# Patient Record
Sex: Female | Born: 1945 | Race: White | Hispanic: No | Smoking: Former smoker
Health system: Southern US, Community
[De-identification: ages and names within clinical notes are randomized; demographics above are authoritative.]

## PROBLEM LIST (undated history)

## (undated) DIAGNOSIS — C50919 Malignant neoplasm of unspecified site of unspecified female breast: Secondary | ICD-10-CM

## (undated) DIAGNOSIS — R0600 Dyspnea, unspecified: Secondary | ICD-10-CM

## (undated) DIAGNOSIS — H269 Unspecified cataract: Secondary | ICD-10-CM

## (undated) DIAGNOSIS — C801 Malignant (primary) neoplasm, unspecified: Secondary | ICD-10-CM

## (undated) DIAGNOSIS — I10 Essential (primary) hypertension: Secondary | ICD-10-CM

## (undated) DIAGNOSIS — J189 Pneumonia, unspecified organism: Secondary | ICD-10-CM

## (undated) DIAGNOSIS — Z923 Personal history of irradiation: Secondary | ICD-10-CM

## (undated) DIAGNOSIS — N183 Chronic kidney disease, stage 3 unspecified: Secondary | ICD-10-CM

## (undated) DIAGNOSIS — M779 Enthesopathy, unspecified: Secondary | ICD-10-CM

## (undated) DIAGNOSIS — Z853 Personal history of malignant neoplasm of breast: Secondary | ICD-10-CM

## (undated) DIAGNOSIS — M1990 Unspecified osteoarthritis, unspecified site: Secondary | ICD-10-CM

## (undated) HISTORY — DX: Unspecified osteoarthritis, unspecified site: M19.90

## (undated) HISTORY — PX: COLONOSCOPY: SHX174

## (undated) HISTORY — DX: Malignant neoplasm of unspecified site of unspecified female breast: C50.919

## (undated) HISTORY — PX: REPLACEMENT TOTAL KNEE: SUR1224

## (undated) HISTORY — PX: BREAST RECONSTRUCTION: SHX9

## (undated) HISTORY — DX: Malignant (primary) neoplasm, unspecified: C80.1

## (undated) HISTORY — DX: Pneumonia, unspecified organism: J18.9

## (undated) HISTORY — PX: ABDOMINAL HYSTERECTOMY: SHX81

## (undated) HISTORY — DX: Enthesopathy, unspecified: M77.9

## (undated) HISTORY — DX: Personal history of malignant neoplasm of breast: Z85.3

## (undated) HISTORY — PX: TISSUE EXPANDER PLACEMENT: SHX2530

---

## 1983-11-15 HISTORY — PX: PARTIAL HYSTERECTOMY: SHX80

## 2000-11-14 HISTORY — PX: CHOLECYSTECTOMY: SHX55

## 2001-08-03 ENCOUNTER — Encounter: Payer: Self-pay | Admitting: Family Medicine

## 2001-08-03 ENCOUNTER — Encounter: Admission: RE | Admit: 2001-08-03 | Discharge: 2001-08-03 | Payer: Self-pay | Admitting: Family Medicine

## 2001-08-08 ENCOUNTER — Encounter: Payer: Self-pay | Admitting: Family Medicine

## 2001-08-08 ENCOUNTER — Encounter: Admission: RE | Admit: 2001-08-08 | Discharge: 2001-08-08 | Payer: Self-pay | Admitting: Family Medicine

## 2001-08-08 DIAGNOSIS — Z853 Personal history of malignant neoplasm of breast: Secondary | ICD-10-CM | POA: Insufficient documentation

## 2001-08-13 ENCOUNTER — Encounter: Payer: Self-pay | Admitting: General Surgery

## 2001-08-13 ENCOUNTER — Encounter: Admission: RE | Admit: 2001-08-13 | Discharge: 2001-08-13 | Payer: Self-pay | Admitting: General Surgery

## 2001-08-15 ENCOUNTER — Encounter: Payer: Self-pay | Admitting: General Surgery

## 2001-08-15 ENCOUNTER — Encounter: Admission: RE | Admit: 2001-08-15 | Discharge: 2001-08-15 | Payer: Self-pay | Admitting: General Surgery

## 2001-08-15 ENCOUNTER — Ambulatory Visit (HOSPITAL_BASED_OUTPATIENT_CLINIC_OR_DEPARTMENT_OTHER): Admission: RE | Admit: 2001-08-15 | Discharge: 2001-08-15 | Payer: Self-pay | Admitting: General Surgery

## 2001-08-15 ENCOUNTER — Encounter (INDEPENDENT_AMBULATORY_CARE_PROVIDER_SITE_OTHER): Payer: Self-pay | Admitting: *Deleted

## 2001-08-28 ENCOUNTER — Ambulatory Visit (HOSPITAL_BASED_OUTPATIENT_CLINIC_OR_DEPARTMENT_OTHER): Admission: RE | Admit: 2001-08-28 | Discharge: 2001-08-28 | Payer: Self-pay | Admitting: General Surgery

## 2001-08-28 ENCOUNTER — Encounter (INDEPENDENT_AMBULATORY_CARE_PROVIDER_SITE_OTHER): Payer: Self-pay | Admitting: *Deleted

## 2001-08-28 HISTORY — PX: BREAST LUMPECTOMY: SHX2

## 2001-09-06 ENCOUNTER — Ambulatory Visit: Admission: RE | Admit: 2001-09-06 | Discharge: 2001-12-05 | Payer: Self-pay | Admitting: Radiation Oncology

## 2001-10-01 ENCOUNTER — Encounter: Admission: RE | Admit: 2001-10-01 | Discharge: 2001-10-01 | Payer: Self-pay | Admitting: Radiation Oncology

## 2002-02-24 ENCOUNTER — Encounter: Payer: Self-pay | Admitting: *Deleted

## 2002-02-25 ENCOUNTER — Inpatient Hospital Stay (HOSPITAL_COMMUNITY): Admission: EM | Admit: 2002-02-25 | Discharge: 2002-02-28 | Payer: Self-pay | Admitting: *Deleted

## 2002-02-25 ENCOUNTER — Encounter: Payer: Self-pay | Admitting: General Surgery

## 2002-03-06 ENCOUNTER — Encounter: Payer: Self-pay | Admitting: General Surgery

## 2002-03-06 ENCOUNTER — Ambulatory Visit (HOSPITAL_COMMUNITY): Admission: RE | Admit: 2002-03-06 | Discharge: 2002-03-06 | Payer: Self-pay | Admitting: General Surgery

## 2002-04-12 ENCOUNTER — Encounter: Payer: Self-pay | Admitting: Family Medicine

## 2002-04-12 ENCOUNTER — Ambulatory Visit (HOSPITAL_COMMUNITY): Admission: RE | Admit: 2002-04-12 | Discharge: 2002-04-12 | Payer: Self-pay | Admitting: Family Medicine

## 2002-05-01 ENCOUNTER — Encounter: Admission: RE | Admit: 2002-05-01 | Discharge: 2002-05-01 | Payer: Self-pay | Admitting: General Surgery

## 2002-05-01 ENCOUNTER — Encounter: Payer: Self-pay | Admitting: General Surgery

## 2002-10-23 ENCOUNTER — Encounter: Admission: RE | Admit: 2002-10-23 | Discharge: 2002-10-23 | Payer: Self-pay | Admitting: General Surgery

## 2002-10-23 ENCOUNTER — Encounter: Payer: Self-pay | Admitting: General Surgery

## 2003-01-06 ENCOUNTER — Ambulatory Visit (HOSPITAL_COMMUNITY): Admission: RE | Admit: 2003-01-06 | Discharge: 2003-01-06 | Payer: Self-pay | Admitting: Family Medicine

## 2003-01-06 ENCOUNTER — Encounter: Payer: Self-pay | Admitting: Family Medicine

## 2003-05-05 ENCOUNTER — Encounter: Payer: Self-pay | Admitting: General Surgery

## 2003-05-05 ENCOUNTER — Encounter: Admission: RE | Admit: 2003-05-05 | Discharge: 2003-05-05 | Payer: Self-pay | Admitting: General Surgery

## 2004-05-11 ENCOUNTER — Encounter: Admission: RE | Admit: 2004-05-11 | Discharge: 2004-05-11 | Payer: Self-pay | Admitting: General Surgery

## 2005-03-09 ENCOUNTER — Ambulatory Visit (HOSPITAL_COMMUNITY): Admission: RE | Admit: 2005-03-09 | Discharge: 2005-03-09 | Payer: Self-pay | Admitting: Family Medicine

## 2005-05-12 ENCOUNTER — Encounter: Admission: RE | Admit: 2005-05-12 | Discharge: 2005-05-12 | Payer: Self-pay | Admitting: General Surgery

## 2005-07-08 ENCOUNTER — Ambulatory Visit: Payer: Self-pay | Admitting: Internal Medicine

## 2005-07-08 ENCOUNTER — Ambulatory Visit (HOSPITAL_COMMUNITY): Admission: RE | Admit: 2005-07-08 | Discharge: 2005-07-08 | Payer: Self-pay | Admitting: Internal Medicine

## 2006-05-15 ENCOUNTER — Encounter (INDEPENDENT_AMBULATORY_CARE_PROVIDER_SITE_OTHER): Payer: Self-pay | Admitting: Diagnostic Radiology

## 2006-05-15 ENCOUNTER — Encounter (INDEPENDENT_AMBULATORY_CARE_PROVIDER_SITE_OTHER): Payer: Self-pay | Admitting: *Deleted

## 2006-05-15 ENCOUNTER — Encounter: Admission: RE | Admit: 2006-05-15 | Discharge: 2006-05-15 | Payer: Self-pay | Admitting: Family Medicine

## 2006-05-29 ENCOUNTER — Encounter: Admission: RE | Admit: 2006-05-29 | Discharge: 2006-05-29 | Payer: Self-pay | Admitting: General Surgery

## 2006-06-07 ENCOUNTER — Encounter: Admission: RE | Admit: 2006-06-07 | Discharge: 2006-06-07 | Payer: Self-pay | Admitting: Family Medicine

## 2006-06-07 ENCOUNTER — Encounter (INDEPENDENT_AMBULATORY_CARE_PROVIDER_SITE_OTHER): Payer: Self-pay | Admitting: *Deleted

## 2006-06-07 ENCOUNTER — Encounter (INDEPENDENT_AMBULATORY_CARE_PROVIDER_SITE_OTHER): Payer: Self-pay | Admitting: Diagnostic Radiology

## 2006-06-29 ENCOUNTER — Encounter: Admission: RE | Admit: 2006-06-29 | Discharge: 2006-06-29 | Payer: Self-pay | Admitting: General Surgery

## 2006-06-29 ENCOUNTER — Ambulatory Visit (HOSPITAL_COMMUNITY): Admission: RE | Admit: 2006-06-29 | Discharge: 2006-06-29 | Payer: Self-pay | Admitting: General Surgery

## 2006-08-15 ENCOUNTER — Ambulatory Visit (HOSPITAL_BASED_OUTPATIENT_CLINIC_OR_DEPARTMENT_OTHER): Admission: RE | Admit: 2006-08-15 | Discharge: 2006-08-16 | Payer: Self-pay | Admitting: General Surgery

## 2006-08-15 ENCOUNTER — Ambulatory Visit (HOSPITAL_COMMUNITY): Admission: RE | Admit: 2006-08-15 | Discharge: 2006-08-15 | Payer: Self-pay | Admitting: General Surgery

## 2006-08-15 ENCOUNTER — Encounter (INDEPENDENT_AMBULATORY_CARE_PROVIDER_SITE_OTHER): Payer: Self-pay | Admitting: *Deleted

## 2006-08-15 HISTORY — PX: MASTECTOMY: SHX3

## 2006-09-25 ENCOUNTER — Encounter: Admission: RE | Admit: 2006-09-25 | Discharge: 2006-09-25 | Payer: Self-pay | Admitting: Oncology

## 2006-09-25 ENCOUNTER — Ambulatory Visit (HOSPITAL_COMMUNITY): Payer: Self-pay | Admitting: Oncology

## 2006-10-11 ENCOUNTER — Ambulatory Visit (HOSPITAL_COMMUNITY): Admission: RE | Admit: 2006-10-11 | Discharge: 2006-10-11 | Payer: Self-pay | Admitting: Oncology

## 2006-11-27 ENCOUNTER — Ambulatory Visit (HOSPITAL_COMMUNITY): Admission: RE | Admit: 2006-11-27 | Discharge: 2006-11-27 | Payer: Self-pay | Admitting: Family Medicine

## 2006-12-01 ENCOUNTER — Ambulatory Visit (HOSPITAL_COMMUNITY): Payer: Self-pay | Admitting: Oncology

## 2006-12-22 ENCOUNTER — Ambulatory Visit (HOSPITAL_COMMUNITY): Payer: Self-pay | Admitting: Oncology

## 2007-03-27 ENCOUNTER — Ambulatory Visit (HOSPITAL_COMMUNITY): Payer: Self-pay | Admitting: Oncology

## 2007-03-27 ENCOUNTER — Encounter (HOSPITAL_COMMUNITY): Admission: RE | Admit: 2007-03-27 | Discharge: 2007-04-26 | Payer: Self-pay | Admitting: Oncology

## 2007-05-22 ENCOUNTER — Encounter: Admission: RE | Admit: 2007-05-22 | Discharge: 2007-05-22 | Payer: Self-pay | Admitting: General Surgery

## 2007-09-26 ENCOUNTER — Ambulatory Visit (HOSPITAL_COMMUNITY): Payer: Self-pay | Admitting: Oncology

## 2008-03-26 ENCOUNTER — Ambulatory Visit (HOSPITAL_COMMUNITY): Payer: Self-pay | Admitting: Oncology

## 2008-05-22 ENCOUNTER — Encounter: Admission: RE | Admit: 2008-05-22 | Discharge: 2008-05-22 | Payer: Self-pay | Admitting: Surgery

## 2008-06-17 ENCOUNTER — Encounter: Admission: RE | Admit: 2008-06-17 | Discharge: 2008-06-17 | Payer: Self-pay | Admitting: Surgery

## 2008-08-18 ENCOUNTER — Inpatient Hospital Stay (HOSPITAL_COMMUNITY): Admission: RE | Admit: 2008-08-18 | Discharge: 2008-08-21 | Payer: Self-pay | Admitting: Orthopedic Surgery

## 2008-11-03 ENCOUNTER — Ambulatory Visit (HOSPITAL_COMMUNITY): Payer: Self-pay | Admitting: Oncology

## 2009-04-23 ENCOUNTER — Encounter (HOSPITAL_COMMUNITY): Admission: RE | Admit: 2009-04-23 | Discharge: 2009-05-23 | Payer: Self-pay | Admitting: Oncology

## 2009-04-23 ENCOUNTER — Ambulatory Visit (HOSPITAL_COMMUNITY): Payer: Self-pay | Admitting: Oncology

## 2009-04-28 ENCOUNTER — Ambulatory Visit (HOSPITAL_COMMUNITY): Admission: RE | Admit: 2009-04-28 | Discharge: 2009-04-28 | Payer: Self-pay | Admitting: Oncology

## 2009-06-17 ENCOUNTER — Encounter: Admission: RE | Admit: 2009-06-17 | Discharge: 2009-06-17 | Payer: Self-pay | Admitting: Family Medicine

## 2009-07-27 ENCOUNTER — Inpatient Hospital Stay (HOSPITAL_COMMUNITY): Admission: RE | Admit: 2009-07-27 | Discharge: 2009-07-29 | Payer: Self-pay | Admitting: Orthopedic Surgery

## 2009-10-26 ENCOUNTER — Ambulatory Visit (HOSPITAL_COMMUNITY): Payer: Self-pay | Admitting: Oncology

## 2010-05-25 ENCOUNTER — Encounter (HOSPITAL_COMMUNITY): Admission: RE | Admit: 2010-05-25 | Discharge: 2010-06-24 | Payer: Self-pay | Admitting: Oncology

## 2010-05-25 ENCOUNTER — Ambulatory Visit (HOSPITAL_COMMUNITY): Payer: Self-pay | Admitting: Oncology

## 2010-06-18 ENCOUNTER — Encounter: Admission: RE | Admit: 2010-06-18 | Discharge: 2010-06-18 | Payer: Self-pay | Admitting: Family Medicine

## 2010-06-25 ENCOUNTER — Encounter: Admission: RE | Admit: 2010-06-25 | Discharge: 2010-06-25 | Payer: Self-pay | Admitting: Family Medicine

## 2010-11-29 ENCOUNTER — Encounter (HOSPITAL_COMMUNITY)
Admission: RE | Admit: 2010-11-29 | Discharge: 2010-12-14 | Payer: Self-pay | Source: Home / Self Care | Attending: Oncology | Admitting: Oncology

## 2010-11-29 ENCOUNTER — Ambulatory Visit (HOSPITAL_COMMUNITY)
Admission: RE | Admit: 2010-11-29 | Discharge: 2010-12-14 | Payer: Self-pay | Source: Home / Self Care | Attending: Oncology | Admitting: Oncology

## 2010-12-03 ENCOUNTER — Other Ambulatory Visit (HOSPITAL_COMMUNITY): Payer: Self-pay | Admitting: Oncology

## 2010-12-03 DIAGNOSIS — C50919 Malignant neoplasm of unspecified site of unspecified female breast: Secondary | ICD-10-CM

## 2010-12-04 ENCOUNTER — Other Ambulatory Visit (HOSPITAL_COMMUNITY): Payer: Self-pay | Admitting: Oncology

## 2010-12-04 DIAGNOSIS — C50919 Malignant neoplasm of unspecified site of unspecified female breast: Secondary | ICD-10-CM

## 2010-12-05 ENCOUNTER — Encounter: Payer: Self-pay | Admitting: General Surgery

## 2011-01-30 LAB — COMPREHENSIVE METABOLIC PANEL
ALT: 18 U/L (ref 0–35)
AST: 20 U/L (ref 0–37)
Alkaline Phosphatase: 83 U/L (ref 39–117)
BUN: 12 mg/dL (ref 6–23)
Calcium: 10.1 mg/dL (ref 8.4–10.5)
Creatinine, Ser: 0.98 mg/dL (ref 0.4–1.2)
GFR calc Af Amer: >60 mL/min (ref >60)
Glucose, Bld: 86 mg/dL (ref 70–99)
Potassium: 4.7 mEq/L (ref 3.5–5.1)
Sodium: 141 mEq/L (ref 135–145)
Total Bilirubin: 0.6 mg/dL (ref 0.3–1.2)
Total Protein: 7.5 g/dL (ref 6.0–8.3)

## 2011-01-30 LAB — LIPID PANEL
HDL: 63 mg/dL (ref >39)
LDL Cholesterol: 153 mg/dL — ABNORMAL HIGH (ref 0–99)
Total CHOL/HDL Ratio: 3.7 RATIO
VLDL: 20 mg/dL (ref 0–40)

## 2011-01-30 LAB — CBC
Hemoglobin: 14.3 g/dL (ref 12.0–15.0)
MCH: 29 pg (ref 26.0–34.0)
RDW: 14.7 % (ref 11.5–15.5)

## 2011-01-30 LAB — DIFFERENTIAL
Eosinophils Relative: 2 % (ref 0–5)
Neutro Abs: 5.7 10*3/uL (ref 1.7–7.7)
Neutrophils Relative %: 73 % (ref 43–77)

## 2011-02-18 LAB — DIFFERENTIAL
Basophils Relative: 1 % (ref 0–1)
Eosinophils Absolute: 0.2 10*3/uL (ref 0.0–0.7)
Eosinophils Relative: 2 % (ref 0–5)
Lymphocytes Relative: 20 % (ref 12–46)
Monocytes Relative: 7 % (ref 3–12)
Neutro Abs: 5.7 10*3/uL (ref 1.7–7.7)
Neutrophils Relative %: 71 % (ref 43–77)

## 2011-02-18 LAB — CBC
Hemoglobin: 9.3 g/dL — ABNORMAL LOW (ref 12.0–15.0)
MCHC: 33.5 g/dL (ref 30.0–36.0)
MCHC: 34.2 g/dL (ref 30.0–36.0)
MCHC: 34.7 g/dL (ref 30.0–36.0)
MCV: 87.6 fL (ref 78.0–100.0)
Platelets: 305 10*3/uL (ref 150–400)
RBC: 3.09 MIL/uL — ABNORMAL LOW (ref 3.87–5.11)
RBC: 3.41 MIL/uL — ABNORMAL LOW (ref 3.87–5.11)
RDW: 13.5 % (ref 11.5–15.5)
WBC: 13.1 10*3/uL — ABNORMAL HIGH (ref 4.0–10.5)
WBC: 8.1 10*3/uL (ref 4.0–10.5)

## 2011-02-18 LAB — COMPREHENSIVE METABOLIC PANEL
CO2: 28 mEq/L (ref 19–32)
Creatinine, Ser: 1.01 mg/dL (ref 0.4–1.2)
Glucose, Bld: 88 mg/dL (ref 70–99)
Total Protein: 7.1 g/dL (ref 6.0–8.3)

## 2011-02-18 LAB — URINALYSIS, ROUTINE W REFLEX MICROSCOPIC
Bilirubin Urine: NEGATIVE
Hgb urine dipstick: NEGATIVE
Protein, ur: NEGATIVE mg/dL
Urobilinogen, UA: 0.2 mg/dL (ref 0.0–1.0)

## 2011-02-18 LAB — BASIC METABOLIC PANEL
CO2: 28 mEq/L (ref 19–32)
Calcium: 8 mg/dL — ABNORMAL LOW (ref 8.4–10.5)
Calcium: 8.4 mg/dL (ref 8.4–10.5)
Creatinine, Ser: 1.13 mg/dL (ref 0.4–1.2)
GFR calc Af Amer: 59 mL/min — ABNORMAL LOW (ref >60)
GFR calc Af Amer: >60 mL/min (ref >60)
GFR calc non Af Amer: 49 mL/min — ABNORMAL LOW (ref >60)
Sodium: 141 mEq/L (ref 135–145)

## 2011-02-18 LAB — TYPE AND SCREEN
ABO/RH(D): A POS
Antibody Screen: NEGATIVE

## 2011-02-18 LAB — URINE CULTURE

## 2011-02-21 LAB — LIPID PANEL
Cholesterol: 216 mg/dL — ABNORMAL HIGH (ref 0–200)
HDL: 58 mg/dL (ref >39)
LDL Cholesterol: 141 mg/dL — ABNORMAL HIGH (ref 0–99)
Total CHOL/HDL Ratio: 3.7 RATIO

## 2011-02-21 LAB — DIFFERENTIAL
Lymphocytes Relative: 22 % (ref 12–46)
Lymphs Abs: 1.3 10*3/uL (ref 0.7–4.0)
Neutro Abs: 4.1 10*3/uL (ref 1.7–7.7)
Neutrophils Relative %: 68 % (ref 43–77)

## 2011-02-21 LAB — CBC
Hemoglobin: 13.5 g/dL (ref 12.0–15.0)
MCHC: 35 g/dL (ref 30.0–36.0)
MCV: 85.6 fL (ref 78.0–100.0)
RBC: 4.52 MIL/uL (ref 3.87–5.11)

## 2011-02-21 LAB — COMPREHENSIVE METABOLIC PANEL
BUN: 14 mg/dL (ref 6–23)
CO2: 27 mEq/L (ref 19–32)
Calcium: 9.4 mg/dL (ref 8.4–10.5)
Creatinine, Ser: 0.93 mg/dL (ref 0.4–1.2)
GFR calc Af Amer: >60 mL/min (ref >60)
GFR calc non Af Amer: >60 mL/min (ref >60)
Glucose, Bld: 92 mg/dL (ref 70–99)

## 2011-03-29 NOTE — Discharge Summary (Signed)
NAME:  Haley Peterson, Haley Peterson NO.:  [id number]   MEDICAL RECORD NO.:  [id number]          PATIENT TYPE:  INP   LOCATION:  5039                         FACILITY:  [facility]   PHYSICIAN:  Mila Homer. Sherlean Foot, M.D. DATE OF BIRTH:  1946/11/13   DATE OF ADMISSION:  08/18/2008  DATE OF DISCHARGE:  08/21/2008                               DISCHARGE SUMMARY   ADMISSION DIAGNOSIS:  The patient has osteoarthritis of the left knee.   DISCHARGE DIAGNOSES:  1. Osteoarthritis of the left knee.  2. Status post left total knee arthroplasty.  3. Acute blood loss anemia status post surgery.   PROCEDURE:  Left total knee arthroscopy.   HISTORY:  The patient has a chief complaint of left knee pain.  The  patient is a 65 year old female who complains of pain in both knees  times several years, left greater than right.  She states the pain is  gradually getting worse, and it is now interfering with activities of  daily living.  The patient has failed conservative treatment.  Risks and  benefits of the surgery were discussed with the patient.  She would like  to proceed with a left TKA.   ALLERGIES:  The patient has allergies to CODEINE and to PENICILLIN.   ADMISSION MEDICATIONS:  1. Femara 2.5 mg daily.  2. Multivitamin daily.  3. Caltrate and plus D 600/400 twice a day.  4. Vitamin C 500 mg daily.   HOSPITAL COURSE:  This is a 65 year old female, admitted on August 18, 2008.  After appropriate laboratory studies were obtained preoperatively  as well as vancomycin on-call to the operating room, she was taken to  the OR where she underwent a left TKA.  She tolerated the procedure well  and was taken to the PACU in good condition.  The patient was placed on  Dilaudid PCA pump, reduced dose protocol.  Foley was placed  intraoperatively.   On postop day 1, vital signs were stable.  The patient denies chest  pain, shortness of breath, or calf pain.  The patient was started on  Lovenox 30 mg  subcu q.12 h. at 8:00 a.m.  Consults to PT/OT and care  management were made.  The patient is weightbearing as tolerated.  CPM  was 0-9 degrees for 6-8 hours per day with incentive spirometry  teaching.   On postop day 2, the patient did not progress well with physical therapy  upon day 2.  Dressing was changed.  Marcaine pump and Hemovac were  discontinued.  Foley was discontinued.  PCA was discontinued and IV was  Hep-Locked.  The patient was continued on p.o. meds for pain.   On postop day 3, the patient was again in physical therapy where goals  were met at that time, and she was okay to go home.  The patient was  discharged after Lovenox teaching.   LABORATORY STUDIES:  Preop labs were white blood cell count 6.3, H&H  were 14 and 41.7, and platelets were 340.  Sodium was 138, K was 4,  chloride was 102, CO2 was  38, glucose was 93, BUN was 12, and creatinine  0.971.  On date of discharge, her white blood cell count was 13.4, H&H  were 9.2 and 27.2.  She was asymptomatic.  Platelets were 223.  Sodium  was 138, potassium was 3.3, chloride was 101, CO2 was 28, glucose was  107, BUN was 7, and creatinine was 0.77.   DISCHARGE INSTRUCTIONS:  There are no restrictions to diet.  Follow the  blue instruction sheet for wound care.  Increase activity slowly.  May  use a cane or walker.  Weightbearing as tolerated.  No lifting or  driving times 6 weeks and home health care.   DISCHARGE MEDICATIONS:  Prescriptions were given for:  1. Robaxin 500 mg take 1-2 tabs every 6-8 hours as needed for spasm      #15.  2. Potassium chloride 10 mEq take 1 tablet twice a day #14 for      hypokalemia.  3. Vicodin 5/500 take 1-2 tablets every 4-6 hours as needed for pain      #16.  4. Lovenox 40 mg inject 1 subcu daily, last dose on September 01, 2008,      #11.   The patient will follow up with Dr. Sherlean Foot on September 02, 2008.  Call for  appointment at [phone].  The patient is discharged in improved   condition.     ______________________________  Altamese Cabal, PA-C    ______________________________  Mila Homer. Sherlean Foot, M.D.    MJ/MEDQ  D:  09/26/2008  T:  09/26/2008  Job:  [id number]

## 2011-03-29 NOTE — Op Note (Signed)
NAME:  Haley Peterson, GOOGE NO.:  [id number]   MEDICAL RECORD NO.:  [id number]          PATIENT TYPE:  INP   LOCATION:  5039                         FACILITY:  [facility]   PHYSICIAN:  Mila Homer. Sherlean Foot, M.D. DATE OF BIRTH:  1946/08/23   DATE OF PROCEDURE:  08/18/2008  DATE OF DISCHARGE:                               OPERATIVE REPORT   SURGEON:  Mila Homer. Sherlean Foot, MD   ASSISTANTS:  Altamese Cabal, PA-C and Skip Mayer, PA-C   PREOPERATIVE DIAGNOSIS:  Left knee osteoarthritis.   POSTOPERATIVE DIAGNOSIS:  Left knee osteoarthritis.   PROCEDURE:  Left total knee arthroplasty.   INDICATIONS FOR PROCEDURE:  The patient is a 65 year old white female  with failure to conservative measures for osteoarthritis of the left  knee.  Informed consent was obtained.   DESCRIPTION OF PROCEDURE:  The patient was laid in supine and  administered general anesthesia.  In the supine position, the Foley  catheter was placed.  The left leg was prepped and draped in the usual  sterile fashion.  Inferolateral and inferomedial portals were created  with a #11 blade, blunt trocar, and cannula.  A midline incision was  made with a #10 blade, 7 inches in length.  I used a fresh blade to make  a median parapatellar arthrotomy and performed synovectomy.  I then  elevated the deep MCL off the medial crest of the tibia around the  semimembranosus and did release the semimembranosus tendon.  I then  everted the patella and measured 24-mm thick.  I reamed down 9 mm with a  32-mm reamer and drilled 3 lug holes with a prosthetic trial in place,  and recreated 24-mm thickness.  I then removed trial component, went to  flexion subluxing the patella laterally.  I used extramedullary  alignment system on the tibia and the intramedullary alignment system on  the femur to make appropriate mesenteric cut to the anatomic axis of the  tibia.  A 6-degree valgus cut in the anatomic axis of femur.  I then  marked out  the epicondylar angle, posterior condylar angle, and the  femur measured 3 degrees.  I sized to size D, pinned to 3 degrees  external rotation holes.  I screwed the formal cutting block in place,  made the anterior, posterior, and chamfer cuts with a sagittal saw.  I  then used the size 4 tibial tray drilling keel on the femur and then  trialed with a 4 tibia, D femur, 10 insert, 32 patella had good  flexion/extension gap balance, did have to perform medial releasing to  obtain that.  Patellar tracking was excellent.  I removed the trial  components and copiously irrigated.  I then cemented in the D femur, 4  tibial component, removed excess cement, snapped 10-mm polyethylene, and  let the cement harden in extension.  I cemented in the patella and  removed excess cement.  After Hemovac coming out deep to the arthrotomy  and superolaterally, pain catheter coming out superomedially and  superficially deep to the arthrotomy.  Closed the arthrotomy after  obtaining  hemostasis with tourniquet down.  Closed the arthrotomy with  figure-of-eight #1 Vicryl sutures.  Then, the deep soft tissues were  buried with 0 Vicryl suture, subcuticular 3-0 Vicryl stitch, and skin  staples.  Dressed with a Xeroform, dry sponges, and stocking.   COMPLICATIONS:  None.   DRAINS:  One Hemovac, one pain catheter.   ESTIMATED BLOOD LOSS:  300 mL.   TOURNIQUET TIME:  47 minutes.           ______________________________  Mila Homer Sherlean Foot, M.D.     SDL/MEDQ  D:  08/18/2008  T:  08/19/2008  Job:  [id number]

## 2011-04-01 NOTE — H&P (Signed)
[facility]  Patient:    Haley Peterson, Haley Peterson Visit Number: [id number] MRN: [id number]          Service Type: MED Location: 3A A339 01 Attending Physician:  Rosalyn Charters Dictated by:   Barbaraann Barthel, M.D. Proc. Date: 02/25/02 Admit Date:  02/24/2002   CC:         Patrica Duel, M.D.  Orlean Patten, M.D.  Lionel December, M.D.   History and Physical  NOTE:  Surgery was asked to see this 65 year old white female who was admitted to the emergency room with right upper quadrant pain and nausea.  CHIEF COMPLAINT:  Right upper quadrant pain radiating across the upper portion of her abdomen that is described as belt-like and radiating to her back.  This has been accompanied with nausea and is approximately 48 hours in duration.  HISTORY OF PRESENT ILLNESS:  The patient states that she was in her usual state of good health when Friday, in the afternoon, she developed some upper abdominal discomfort.  This was described as radiating across her back.  She attributed this first to some dyspepsia.  However, this did not improve when she took some antiacids.  This later developed more severely on Sunday requiring admission approximately around midnight on Sunday evening.  She was seen by the emergency room and admitted, and surgery was consulted.  PHYSICAL EXAMINATION:  GENERAL:  Discloses a pleasant 65 year old white female in no acute distress. She is 5 feet 5 inches tall and weighs 172 pounds.  VITAL SIGNS:  Afebrile with temperature 97.5, heart rate is regular at 67 per minute, respirations 16 per minute and blood pressure is 133/67.  HEENT:  Head is normocephalic.  Eyes; extraocular movements are intact, pupils are round and react to light and accommodation, there is no conjunctive pallor or scleral injection, scleral is a normal tincture.  Ears; no discharge, complaint of pain or loss of hearing.  Mouth and oral mucosa; within normal limits.  Nose;  within normal limits.  NECK:  Supple and cylindrical without jugular vein distention, thyromegaly, tracheal deviation, or cervical adenopathy.  No bruits are auscultated.  LUNGS:  Clear both anterior and posterior auscultation.  HEART:  Regular rhythm, no murmurs or gallops are appreciated.  ABDOMEN:  Bowel sounds are present.  The patient is tender on deep palpation just to the right of the midline in the upper right quadrant.  There are no femoral or inguinal hernias appreciated.  RECTAL:  Guaiac negative stool.  No uterus is palpated.  EXTREMITIES:  Within normal limits.  No clubbing, varicosities, or cyanosis are appreciated.  REVIEW OF SYSTEMS:  GI:  History of 48 hours duration of right upper quadrant pain radiating to her back and accompanied with nausea.  The patient has no past history of hepatitis or inflammatory bowel disease.  The patient describes no change in her bowel habits, no bright red rectal bleeding or black tarry stools, and no unexplained weight loss.  No past history of hepatitis.   GU:  No history of nephrolithiasis or dysuria.  ENDOCRINE:  No history of diabetes or thyroid disease.  CARDIOVASCULAR:  No history of chest pain or shortness of breath, or any history of myocardial infarction. MUSCULOSKELETAL:  The patient has had problems in the past with her left knee requiring laparoscopic arthroscopy and she fractured her left knee in the past.  CURRENT MEDICATIONS:  Patient takes no medicines on a regular basis.  ALLERGIES:  PENICILLIN and TERRAMYCIN.  SOCIAL HISTORY:  She  is a nonsmoker and nondrinker.  LABORATORY DATA:  The patient was admitted with elevated liver enzymes with SGPT of 247, SGPT of 279, alkaline phosphatase of 158, and bilirubin of 1.4. Her electrolytes were within normal limits.  Her white count was 8.5 with an H&H of 12.9 and 36.0 with 83 neutrophils noted.  The patients followup liver function studies show a downward trend where  the alkaline phosphatase is decreased from 158 to 135, SGPT decreased from 247 to 212, and SGPT slightly increased from 279 to 295, and amylase is also decreased to 165.  Acute abdominal series shows normal chest x-ray, normal gas pattern without free air or obstructive pattern.  Sonogram revealed the common bile duct is 7 mm and with multiple stones within and a slightly thickened gallbladder.  IMPRESSION:  Gallstone pancreatitis with a downward trend of her liver function studies and amylase.  I suspect she has passed a small stone.  She has a small common bile duct with no dilated intrahepatic radicles.  I have asked the GI service to consult to evaluate the need for an ERCP.  This may not be necessary as these studies are showing a decrease and a downward trend, and the patient has developed no febrile episodes or any acute abdominal changes.  PLAN:  For laparoscopic cholecystectomy when the pancreatitis has subsided somewhat, and we will obtain input by the GI service.  In the interim, we will keep her on clear liquids and I have not begun any antibiotics at this point.  I will follow her during this hospitalization and she will have an interval cholecystectomy during this hospitalization if all are in agreement. Dictated by:   Barbaraann Barthel, M.D. Attending Physician:  Rosalyn Charters DD:  02/25/02 TD:  02/25/02 Job: [id number]

## 2011-04-01 NOTE — Op Note (Signed)
[facility]  Patient:    Haley Peterson, Haley Peterson Visit Number: [id number] MRN: [id number]          Service Type: DSU Location: [facility] Attending Physician:  Janalyn Rouse Dictated by:   Rose Phi. Maple Hudson, M.D. Proc. 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  OPERATION:  Wide excision of previous lumpectomy site.  SURGEON:  Rose Phi. Maple Hudson, M.D.  ANESTHESIA:  General.  DESCRIPTION OF PROCEDURE: After suitable general anesthesia was induced, the patient was placed in the supine position with the left arm extended on the arm board.  The left breast was then prepped and draped in the usual fashion. An elliptical incision incorporated the previous scar that was radial in design centered in the 3 oclock position of her left breast.  A wide excision of the tissue including the previous biopsy cavity was then carried out. After removing this, the specimen was oriented for pathologist and then submitted to them to evaluate margins on permanents.  Hemostasis was obtained with the cautery.  This excision went down to the chest wall incorporating the pectoralis fascia and certainly I cannot go any deeper.  The skin was closed with interrupted 4-0 nylon sutures.  Dressing applied.  The patient was transferred to the recovery room in satisfactory condition having tolerated the procedure well. Dictated by:   Rose Phi. Maple Hudson, M.D. Attending Physician:  Janalyn Rouse DD:  08/15/01 TD:  08/28/01 Job: [id number]

## 2011-04-01 NOTE — Op Note (Signed)
NAME:  Haley Peterson, Haley Peterson                   ACCOUNT NO.:  [id number]   MEDICAL RECORD NO.:  [id number]          PATIENT TYPE:  AMB   LOCATION:  DSC                          FACILITY:  [facility]   PHYSICIAN:  Rose Phi. Maple Hudson, M.D.   DATE OF BIRTH:  1945-11-29   DATE OF PROCEDURE:  08/15/2006  DATE OF DISCHARGE:                                 OPERATIVE REPORT   PREOPERATIVE DIAGNOSIS:  Multicentric carcinoma of the right breast.   POSTOPERATIVE DIAGNOSIS:  Multicentric carcinoma of the right breast.   OPERATIONS:  1. Blue dye injection.  2. Right sentinel lymph node biopsy.  3. Right total mastectomy.   SURGEON:  Rose Phi. Maple Hudson, M.D.   ANESTHESIA:  General.   OPERATIVE PROCEDURE:  Prior to coming to the operating room, 1 mCi of  technetium Sulfur Colloid was injected intradermally in the periareolar  tissue of the right breast.   After suitable general anesthesia was induced, the patient was placed in a  supine position with the arms extended on the arm board.  Then, 5 cc of a  mixture of 2 cc of methylene blue and 3 cc of injectable saline was injected  in the subareolar tissue of the right breast and the breast then gently  massaged for 3 minutes.  We then prepped and draped out the breast and  axilla, including the left breast.   A short transverse axillary incision was made on the right side, with  dissection through the subcutaneous tissue to the clavipectoral fascia.  Deep to the fascia were 2 blue and hot lymph nodes, which were excised as  sentinel nodes.  There were no other blue, hot or palpable nodes.   While the nodes were being evaluated by the pathologist, transverse  elliptical incisions incorporating the nipple-areolar complex were outlined.  The incisions were made and the flaps dissected in the standard fashion  using the cautery, going superiorly to near the clavicle, medially to the  medial border of the sternum, and inferiorly to the level of the  inframammary fold  at the rectus fascia, and laterally to the latissimus  dorsi muscle.   We then removed the breast by dissecting from medial to lateral,  incorporating the pectoralis fascia.  At the end of this dissection, the  pathologist reported that both sentinel nodes were negative for metastatic  disease.  Having then terminated the mastectomy at the level of the axilla,  we obtained good hemostasis with the cautery.  We thoroughly irrigated the  field with saline.   Dr. Etter Sjogren was then to place a tissue expander for reconstruction  purposes, and that will be dictated in a separate note.      Rose Phi. Maple Hudson, M.D.  Electronically Signed     PRY/MEDQ  D:  08/15/2006  T:  08/15/2006  Job:  [id number]   cc:   Etter Sjogren, M.D.

## 2011-04-01 NOTE — Op Note (Signed)
NAME:  Haley Peterson, NONG NO.:  [id number]   MEDICAL RECORD NO.:  [id number]          PATIENT TYPE:  AMB   LOCATION:  DSC                          FACILITY:  [facility]   PHYSICIAN:  Etter Sjogren, M.D.     DATE OF BIRTH:  1946-04-20   DATE OF PROCEDURE:  08/15/2006  DATE OF DISCHARGE:                                 OPERATIVE REPORT   PREOPERATIVE DIAGNOSIS:  Right breast cancer.   POSTOPERATIVE DIAGNOSIS:  Right breast cancer.   PROCEDURE PERFORMED:  Right breast reconstruction with tissue expander.   SURGEON:  Etter Sjogren, M.D.   ANESTHESIA:  General.   ESTIMATED BLOOD LOSS:  Minimal.   DRAINS:  One Harrison Mons was left.   CLINICAL NOTE:  A 66 year old woman, who has right breast cancer.  She has  previously had left breast cancer treated with lumpectomy and radiation  therapy, right breast cancer, and she desired reconstruction.  Mastectomy  was planned.  She selected use of a tissue expander, followed eventually by  a staged procedure using an implant, and she preferred this over using her  own tissue.  After the nature of this  procedure and the risks and possible  complications were discussed with her in detail on two separate occasions,  she understood the risks and possible complications, overall convalescence,  and wished to proceed.   DESCRIPTION OF PROCEDURE:  The patient was in the operating room and the  mastectomy had been completed.  The dissection was carried deep to the  pectoralis major muscle.  A submuscular pocket was developed down to the  inframammary crease.  Great care taken to avoid damage to underlying chest  cavity.  Meticulous hemostasis with electrocautery.  Thorough irrigation  with saline, as well as with antibiotic solution.  The implant was prepared  after thoroughly cleaning gloves.  This was a McGhan tissue expander, style  , 300 cc implant.  The implant was prepared, soaked in antibiotic  solution.  Air was removed, 50 cc of  saline placed using the closed filling  system.  The implant was positioned in a submuscular pocket after checking  for hemostasis, which was noted to be excellent.  The muscle was closed with  3-0 Vicryl interrupted, horizontal mattress sutures, taking care to avoid  damage to the underlying implant/expander.  A Blake drain was positioned and  brought out through a separate stab wound inferiorly and secured with a 3-0  Prolene suture.  Excellent hemostasis was again confirmed, and the remainder  of the closure with 3-0 Monocryl interrupted, inverted deep sutures, and  running 4-0 Monocryl  subcuticular sutures.  Steri-Strips, soft sterile dressing, circumferential  Ace wrap applied.  Transferred to the recovery room stable and awake, having  tolerated the procedure well.   DISPOSITION:  She will be observed overnight here.      Etter Sjogren, M.D.  Electronically Signed     DB/MEDQ  D:  08/15/2006  T:  08/15/2006  Job:  [id number]   cc:   Dr Etter Sjogren

## 2011-04-01 NOTE — Discharge Summary (Signed)
[facility]  Patient:    SRAH, AKE Visit Number: [id number] MRN: [id number]          Service Type: OUT Location: RAD Attending Physician:  Barbaraann Barthel Dictated by:   Barbaraann Barthel, M.D. Admit Date:  03/06/2002 Discharge Date: 03/06/2002                             Discharge Summary  DIAGNOSES: 1. Biliary pancreatitis. 2. Cholecystitis, cholelithiasis.  CONSULTATIONS: 1. Lionel December, M.D., GI service. 2. Patrica Duel, M.D., Medical service.  PROCEDURE:  February 27, 2002 laparoscopic cholecystectomy.  SUMMARY:  This is a 65 year old white female who was admitted through the emergency room with gallstone pancreatitis. We saw her in consultation with the GI service in essence over the two days preceding her surgery. Clinically, her abdominal pain resolved and her liver function studies and her amylase returned to within normal limits and she was taken to surgery. This was performed on February 28, 2002. A laparoscopic cholecystectomy was performed uneventfully. She was noted to have a very short cystic duct and a nondilated common bile duct, and I did not perform a cholangiogram; however, she clearly had clinically passed her gallstones and preoperatively her pancreatitis had subsided. Pathology revealed chronic calculus cholecystitis.  HOSPITAL COURSE:  The patient did quite well after surgery. Her diet and activity was advanced as tolerated. Her liver function studies showed a downward trend after her surgery. Her AST was 63 on February 26, 2002 with an ALT of 5 and a total bilirubin of 0.6. Her amylase on February 26, 2002 had diminished from 292 on admission to 43. At the time prior to her discharge, she was tolerating p.o. well, she was afebrile, her wounds were clean, and she was voiding without dysuria, or having any shortness of breath or leg pain.  LABORATORY AND ACCESSORY DATA:  As mentioned above regarding her liver function studies  and amylase. On February 26, 2002, her white count was 4.4 with an H&H of 11.8 and 33.4.  Chest x-ray showed no acute changes. Her acute abdominal series was grossly within normal limits. She had an ultrasound which showed cholelithiasis with acute cholecystitis, and there was question choledocholithiasis. There was also found a small right renal cyst that was nonsignificant. Common bile duct was not dilated and it was 7 mm in diameter. EKG was within normal limits.  DISPOSITION:  The patient did well after surgery with no acute changes, and she was discharged on February 28, 2002.  DISCHARGE INSTRUCTIONS:  WORK:  She is excused from work.  DIET:  She is discharged on a full liquid and a soft diet.  ACTIVITY:  She is told to increase her activity as tolerated. She is permitted a shower or tub bath and shampoo her hair. She is permitted to walk up and down the stairs. She is told to refrain from any vigorous lifting, no driving, and refrain from any sexual activity in the immediate postoperative period.  DISCHARGE MEDICATIONS: 1. Darvocet one tab p.o. q.4 h. p.r.n. pain. 2. Clean her wound with alcohol 3 times a day.  FOLLOWUP:  We will follow her perioperatively after which she is to return to the medical service. Dictated by:   Barbaraann Barthel, M.D. Attending Physician:  Barbaraann Barthel DD:  03/26/02 TD:  03/27/02 Job: [id number]

## 2011-04-01 NOTE — Op Note (Signed)
[place]. [facility]  Patient:    Haley Peterson, Haley Peterson Visit Number: [id number] MRN: [id number]          Service Type: DSU Location: [facility] Attending Physician:  Janalyn Rouse Dictated by:   Rose Phi. Maple Hudson, M.D. Proc. Date: 08/15/01 Admit Date:  08/15/2001                             Operative Report  PREOPERATIVE DIAGNOSIS:  Ductal carcinoma in situ of the left breast, complex.  POSTOPERATIVE DIAGNOSIS:  Ductal carcinoma in situ of the left breast, complex.  PROCEDURES: 1. Blue dye injection. 2. Left axillary sentinel lymph node biopsy. 3. Left partial mastectomy with needle localization and specimen mammography.  SURGEON:  Rose Phi. Maple Hudson, M.D.  ANESTHESIA:  General.  DESCRIPTION OF PROCEDURE:  Prior to coming to the operating room, 1 mCi of Technetium sulfur colloid was injected intradermally and then, after she was put to sleep, 5 cc of lymphazurin blue was injected subareolarly.  After suitable general anesthesia was induced and the blue dye had been injected, the patient was placed in the supine position with the left arm extended on the arm board.  The abnormal area was at the 3 oclock position of the left breast, and a wire was centered there.  A radial incision was made and a wide excision of this area incorporating the wire and surrounding tissue was carried out.  The specimen was oriented for the pathologist.  It was then submitted for specimen mammography, which confirmed the removal of the lesion, and then submitted to the pathologist for touch prep.  The touch prep showed abnormal cells at the 12 oclock position, so I excised some more tissue, and that wound was closed with subcuticular 4-0 Monocryl and Steri-Strips.  While the pathologist was reviewing the partial mastectomy specimen, a short transverse left axillary incision was made with dissection through the subcutaneous tissue to the clavipectoral fascia.  Using the NeoProbe as  a guide and then following a blue lymphatic, a blue and hot lymph node was then removed.  There were no other blue or hot or palpable nodes.  This was submitted to the pathologist for touch preps, which turned out to be negative. The incisions were closed with 3-0 Vicryl, 4-0 Monocryl, and Steri-Strips. Dressings applied.  Patient transferred to the recovery room in satisfactory condition, having tolerated the procedure well. Dictated by:   Rose Phi. Maple Hudson, M.D. Attending Physician:  Janalyn Rouse DD:  08/15/01 TD:  08/16/01 Job: [id number]

## 2011-04-01 NOTE — Op Note (Signed)
NAME:  Haley Peterson, Haley Peterson                   ACCOUNT NO.:  [id number]   MEDICAL RECORD NO.:  [id number]          PATIENT TYPE:  AMB   LOCATION:  DAY                           FACILITY:  [facility]   PHYSICIAN:  Lionel December, M.D.    DATE OF BIRTH:  1946/03/21   DATE OF PROCEDURE:  07/08/2005  DATE OF DISCHARGE:                                 OPERATIVE REPORT   PROCEDURE:  Colonoscopy.   INDICATIONS:  Haley Peterson is a 65 year old Caucasian female who is here for  screening colonoscopy. Family history is negative for colorectal carcinoma.  Procedure risks were reviewed with the patient and informed consent was  obtained.   PREOPERATIVE MEDICATIONS:  Demerol 50 mg IV, Versed 5 mg IV in divided  doses.   FINDINGS:  Procedure performed in endoscopy suite. The patient's vital signs  and O2 saturation were monitored during the procedure and remained stable.  The patient was placed in left lateral position and rectal examination  preformed. No abnormality noted on external or digital exam. Olympus  videoscope was placed in rectum and advanced under vision into sigmoid colon  and beyond. Preparation was excellent. Somewhat redundant colon but scope  was passed into cecum which was identified by ileocecal valve and  appendiceal orifice. Pictures taken for the record. As the scope was  withdrawn, the colonic mucosa was once again carefully examined, and no  mucosa abnormalities were noted. Rectal mucosa was normal. Scope was  retroflexed to examine anorectal junction which was unremarkable. Endoscope  was straightened and withdrawn. The patient tolerated the procedure well.   FINAL DIAGNOSIS:  Normal colonoscopy.   RECOMMENDATIONS:  1.  She will resume her usual medications and vitamins.  2.  Yearly Hemoccults.  3.  She may consider next screening exam in 10 years from now.      Lionel December, M.D.  Electronically Signed     NR/MEDQ  D:  07/08/2005  T:  07/08/2005  Job:  [id number]

## 2011-04-01 NOTE — Op Note (Signed)
[facility]  Patient:    Haley Peterson, Haley Peterson Visit Number: [id number] MRN: [id number]          Service Type: MED Location: 3A A339 01 Attending Physician:  Rosalyn Charters Dictated by:   Barbaraann Barthel, M.D. Proc. Date: 02/27/02 Admit Date:  02/24/2002   CC:         Lionel December, M.D.  Christin Bach, M.D.   Operative Report  PREOPERATIVE DIAGNOSIS:  Gallstone pancreatitis.  POSTOPERATIVE DIAGNOSIS:  Gallstone pancreatitis.  OPERATION:  Laparoscopic cholecystectomy.  SURGEON:  Barbaraann Barthel, M.D.  ASSISTANT:  Christin Bach, M.D.  NOTE:  This is a 65 year old white female who was admitted through the emergency room with gallstone pancreatitis.  We saw her in consultation with the GI service.  In essence over the two days prior to her surgery, clinically her abdominal pain resolved, and her nausea resolved, and her liver function studies and amylase returned to within normal limits.  At the time of surgery, her abdomen was completely soft.  Her bilirubin and all of her liver function studies and amylase were within normal limits.  GROSS OPERATIVE FINDINGS:  There were adhesions about the gallbladder.  The gallbladder was minimally thickened with some edema.  The cystic duct was short and of normal caliber.  Sonogram preoperatively revealed minimally dilated common bile duct with a diameter of 7 mm.  As this was a short cystic duct and small in nature, I did not risk canalization of the cystic duct for cholangiogram.  TECHNIQUE:  The patient was placed in the supine position.  After adequate administration of general anesthesia by endotracheal intubation, her entire abdomen was prepped with Betadine solution and draped in the usual manner. Prior to this, a Foley catheter was aseptically inserted.  A periumbilical incision was carried out over the superior aspect of the umbilicus.  With the patient in Trendelenburg, a sharp towel clip was used to  elevate the fascia around the umbilicus, and a Veress needle was inserted and confirmed in position with a saline drop test.  The abdomen was then insufflated with about 3.3 liters of CO2, and the the 11 mm cannula was placed in the umbilicus using the Visiport technique.  Then under direct vision, three other cannulas were placed, an 11 mm cannula in the epigastrium and two 5 mm cannulas in the right upper quadrant laterally.  The gallbladder was grasped.  Its adhesions were taken down, lightly cauterized.  The fundus of the gallbladder was grasped as well as Hartmanns pouch, and we dissected free the cystic duct.  As this was a short cystic duct and for the above-mentioned reasons, I triply silver clipped it on the side of the common bile duct and singly silver clipped it on the side of the gallbladder and divided it.  The cystic artery was likewise identified, triply silver clipped and divided, and the gallbladder was removed from the liver bed using the hook cautery device uneventfully.  There was minimal amount of oozing.  This was easily controlled with the cautery device, and the gallbladder was then removed from the abdomen using the Endosac device.  After checking for hemostasis, I then irrigated the right upper quadrant, and I elected to leave Surgicel in the liver bed.  The abdomen was then desufflated, and fascia in the area where the 11 mm cannulas were placed in the epigastrium and the umbilicus was then anesthetized with 0.5% Sensorcaine to help with postoperatively pain.  All incisions were then closed in the  skin using the stapling device.  Prior to closure, all sponge, needle, and instrument counts were found to be correct.  Estimated blood loss was minimal.  No drains were placed.  The patient received a liter of crystalloids intraoperatively, and there were no complications. Dictated by:   Barbaraann Barthel, M.D. Attending Physician:  Rosalyn Charters DD:  02/27/02 TD:   02/28/02 Job: [id number]

## 2011-04-01 NOTE — Consult Note (Signed)
[facility]  Patient:    JESSIAH, WOJNAR Visit Number: [id number] MRN: [id number]          Service Type: MED Location: 3A A339 01 Attending Physician:  Rosalyn Charters Dictated by:   Barbaraann Barthel, M.D. Proc. Date: 02/25/02 Admit Date:  02/24/2002                            Consultation Report  5ADDENDUM:  REVIEW OF SYSTEMS:  OB/GYN:  The patient is a gravid 1, para 1, cesarean section 0, abortus 0 female who has had carcinoma of the breast last year for which she had a wide excision and sentinel lymph node biopsy and radiation treatment.  She received no chemotherapy.  She has also undergone in the 1980s and a vaginal hysterectomy.  Other surgeries have included a laparoscopic surgery on her left knee and no other surgeries.  As stated, this patient takes no medicines on a regular basis.  Allergic to penicillin and Terramycin.Dictated by:   Barbaraann Barthel, M.D. Attending Physician:  Rosalyn Charters DD:  02/25/02 TD:  02/25/02 Job: [id number]

## 2011-05-23 ENCOUNTER — Ambulatory Visit (HOSPITAL_COMMUNITY)
Admission: RE | Admit: 2011-05-23 | Discharge: 2011-05-23 | Disposition: A | Discharge status: Home / Self Care | Payer: Medicare Other | Source: Ambulatory Visit | Attending: Oncology | Admitting: Oncology

## 2011-05-23 ENCOUNTER — Other Ambulatory Visit: Payer: Self-pay

## 2011-05-23 DIAGNOSIS — Z780 Asymptomatic menopausal state: Secondary | ICD-10-CM | POA: Insufficient documentation

## 2011-05-23 DIAGNOSIS — M818 Other osteoporosis without current pathological fracture: Secondary | ICD-10-CM | POA: Insufficient documentation

## 2011-05-23 DIAGNOSIS — C50919 Malignant neoplasm of unspecified site of unspecified female breast: Secondary | ICD-10-CM

## 2011-05-31 ENCOUNTER — Telehealth (HOSPITAL_COMMUNITY): Payer: Self-pay

## 2011-05-31 NOTE — Telephone Encounter (Signed)
Per patient, this was discussed with her by Edythe Lynn, RN.

## 2011-06-02 ENCOUNTER — Other Ambulatory Visit: Payer: Self-pay | Admitting: Internal Medicine

## 2011-06-02 DIAGNOSIS — Z9011 Acquired absence of right breast and nipple: Secondary | ICD-10-CM

## 2011-06-02 DIAGNOSIS — Z1231 Encounter for screening mammogram for malignant neoplasm of breast: Secondary | ICD-10-CM

## 2011-06-02 DIAGNOSIS — Z9882 Breast implant status: Secondary | ICD-10-CM

## 2011-06-14 ENCOUNTER — Ambulatory Visit (INDEPENDENT_AMBULATORY_CARE_PROVIDER_SITE_OTHER): Payer: Self-pay | Admitting: Surgery

## 2011-06-23 ENCOUNTER — Other Ambulatory Visit (HOSPITAL_COMMUNITY): Payer: Self-pay

## 2011-06-27 ENCOUNTER — Encounter (HOSPITAL_COMMUNITY): Payer: Self-pay

## 2011-06-27 ENCOUNTER — Encounter (HOSPITAL_COMMUNITY): Payer: Medicare Other | Attending: Oncology

## 2011-06-27 VITALS — BP 146/79 | HR 96 | Temp 98.3°F | Wt 179.6 lb

## 2011-06-27 DIAGNOSIS — Z79899 Other long term (current) drug therapy: Secondary | ICD-10-CM | POA: Insufficient documentation

## 2011-06-27 DIAGNOSIS — C50919 Malignant neoplasm of unspecified site of unspecified female breast: Secondary | ICD-10-CM | POA: Insufficient documentation

## 2011-06-27 DIAGNOSIS — Z170 Estrogen receptor positive status [ER+]: Secondary | ICD-10-CM

## 2011-06-27 LAB — DIFFERENTIAL
Basophils Absolute: 0 10*3/uL (ref 0.0–0.1)
Basophils Relative: 0 % (ref 0–1)
Lymphocytes Relative: 17 % (ref 12–46)
Monocytes Absolute: 0.6 10*3/uL (ref 0.1–1.0)
Neutro Abs: 7 10*3/uL (ref 1.7–7.7)
Neutrophils Relative %: 74 % (ref 43–77)

## 2011-06-27 LAB — CBC
HCT: 41.8 % (ref 36.0–46.0)
MCH: 28.8 pg (ref 26.0–34.0)
MCV: 88.4 fL (ref 78.0–100.0)
Platelets: 336 10*3/uL (ref 150–400)
RDW: 13.5 % (ref 11.5–15.5)

## 2011-06-27 LAB — COMPREHENSIVE METABOLIC PANEL
AST: 13 U/L (ref 0–37)
Albumin: 3.7 g/dL (ref 3.5–5.2)
BUN: 14 mg/dL (ref 6–23)
CO2: 28 mEq/L (ref 19–32)
Calcium: 10.1 mg/dL (ref 8.4–10.5)
Creatinine, Ser: 0.95 mg/dL (ref 0.50–1.10)
GFR calc non Af Amer: 59 mL/min — ABNORMAL LOW (ref >60)

## 2011-06-27 LAB — LIPID PANEL
Cholesterol: 230 mg/dL — ABNORMAL HIGH (ref 0–200)
HDL: 62 mg/dL (ref >39)
LDL Cholesterol: 149 mg/dL — ABNORMAL HIGH (ref 0–99)
Triglycerides: 93 mg/dL (ref <150)

## 2011-06-27 LAB — LACTATE DEHYDROGENASE: LDH: 140 U/L (ref 94–250)

## 2011-06-27 NOTE — Patient Instructions (Signed)
[facility]  Discharge Instructions  RECOMMENDATIONS MADE BY THE CONSULTANT AND ANY TEST RESULTS WILL BE SENT TO YOUR REFERRING DOCTOR.   EXAM FINDINGS BY MD TODAY AND SIGNS AND SYMPTOMS TO REPORT TO CLINIC OR PRIMARY MD: Exam per Dr. Dalene Carrow.   INSTRUCTIONS GIVEN AND DISCUSSED: Other  Bone Density was reviewed and you are to continue same therapy and repeat in 2 years.  SPECIAL INSTRUCTIONS/FOLLOW-UP: Return to Clinic in December.   I acknowledge that I have been informed and understand all the instructions given to me and received a copy. I do not have any more questions at this time, but understand that I may call the Specialty Clinic at [facility] at [phone] during business hours should I have any further questions or need assistance in obtaining follow-up care.    __________________________________________  _____________  __________ Signature of Patient or Authorized Representative            Date                   Time    __________________________________________ Nurse's Signature

## 2011-06-27 NOTE — Progress Notes (Signed)
CC:   Catalina Pizza, M.D. Currie Paris, M.D.  IDENTIFYING STATEMENT:  The patient is a 65 year old woman with breast cancer who presents for followup.  INTERIM HISTORY:  Mrs. Thomure remains on Femara and appears to be doing fairly well.  She denies bone pain.  She received a DEXA scan on 05/23/2011, which remains stable.  She denies nausea, vomiting, and abdominal pain.  She has not noted any new breast masses or chest wall lesions. She is not had any change in her bowel function.  MEDICATIONS:  Documented and recorded in intake sheets.  ALLERGIES:  Penicillin, tamoxifen and Aromasin.  REVIEW OF SYSTEMS:  A 10-point review of systems is negative.  PHYSICAL EXAMINATION:  The patient is a well-appearing, well-nourished woman in no distress.  Vitals:  Pulse 96, blood pressure 146/79, temperature 98.3, respirations 18, weight 179 pounds.  HEENT:  Head is atraumatic, normocephalic.  Sclerae anicteric.  Mouth: Moist without thrush.  Neck:  Supple.  Chest: Clear to percussion and auscultation. Breasts:  Exam notes no palpable masses.  No chest wall lesions noted. Skin:  Noted pigmented raised nodules on her back.  The patient notes that this is not new.  Abdomen:  Soft, nontender.  Bowel sounds present. Extremities:  No edema. Pulses present.  CNS: Non focal.  LAB DATA:  Pending.  IMPRESSION AND PLAN:  Mrs. Kirshner is a 65 year old woman who is status post right mastectomy on 08/15/2006 followed by reconstruction for a 1.3 cm and a 7 mm infiltrating ductal carcinomas of the right breast.  The 1.3 cm tumor was ER positive at 83%, PR positive at 84% and HER-2/neu negative, KI-67 marker was 4% and the 7 mm tumor was ER positive at 48%, PR Positive at 92% and KI-67 marker was 4%. HER-2/neu was negative. The patient was not able to tolerate tamoxifen or Aromasin.  However has been on Femara and continues to tolerate the medication well. She completes 5 years of aromatase inhibitor in  November 2012. She follows up then.  She will have labs performed before she is discharged from clinic. She follows up with Dr. Catalina Pizza regarding the skin lesion on her back.    ______________________________ Laurice Record, M.D. LIO/MEDQ  D:  06/27/2011  T:  06/27/2011  Job:  [id number]

## 2011-06-29 ENCOUNTER — Encounter (INDEPENDENT_AMBULATORY_CARE_PROVIDER_SITE_OTHER): Payer: Self-pay | Admitting: Surgery

## 2011-06-30 ENCOUNTER — Ambulatory Visit
Admission: RE | Admit: 2011-06-30 | Discharge: 2011-06-30 | Disposition: A | Discharge status: Home / Self Care | Payer: Medicare Other | Source: Ambulatory Visit | Attending: Internal Medicine | Admitting: Internal Medicine

## 2011-06-30 DIAGNOSIS — Z9011 Acquired absence of right breast and nipple: Secondary | ICD-10-CM

## 2011-06-30 DIAGNOSIS — Z9882 Breast implant status: Secondary | ICD-10-CM

## 2011-06-30 DIAGNOSIS — Z1231 Encounter for screening mammogram for malignant neoplasm of breast: Secondary | ICD-10-CM

## 2011-07-07 ENCOUNTER — Ambulatory Visit (INDEPENDENT_AMBULATORY_CARE_PROVIDER_SITE_OTHER): Payer: Medicare Other | Admitting: Surgery

## 2011-07-07 ENCOUNTER — Encounter (INDEPENDENT_AMBULATORY_CARE_PROVIDER_SITE_OTHER): Payer: Self-pay | Admitting: Surgery

## 2011-07-07 VITALS — BP 146/86 | HR 68

## 2011-07-07 DIAGNOSIS — Z853 Personal history of malignant neoplasm of breast: Secondary | ICD-10-CM

## 2011-07-07 NOTE — Patient Instructions (Signed)
Ask Dr Mariel Sleet about your anti-estrogen medicine. We can see you back on an as needed basis

## 2011-07-07 NOTE — Progress Notes (Signed)
Chief complaint: Breast cancer followup.  History of present illness: This patient had a right mastectomy with sentinel node by Dr. Maple Hudson in 2007 and a prior left lumpectomy with radiation therapy in 2002. She is in for a routine followup. She had a implant reconstruction on the right.  She's had no problems or concerns on either side. Past history family history and review of systems are unchanged from her last visit. ROS no change Examination Gen.: The patient is alert, oriented, and healthy-appearing. Breasts: The right side is post mastectomy with implant and no evidence of local recurrence. The left breast shows a significant loss of tissue laterally at the lumpectomy site. This is been present and I think unchanged. There is nothing to suggest a recurrence as the tissues were soft and nontender. There are minimal radiation type changes noted. Lymphatics: There is no axillary or supraventricular adenopathy on either side.  Extremities: Excellent range of motion of the both sides and there is no lymph edema noted.  Data reviewed: Mammogram was recently done and negative.  Impression stable exam no evidence of recurrence on either side.  Plan she is now five years outside think we can see her here PRN. She is following up closely with Dr.Neijstrom. She is anxious about her antiestrogen therapy stopping after five years. She will ask her oncologist about that.

## 2011-08-15 LAB — URINE CULTURE
Colony Count: NO GROWTH
Culture: NO GROWTH
Culture: NO GROWTH

## 2011-08-15 LAB — CBC
HCT: 41.7
HCT: 27.2 — ABNORMAL LOW
HCT: 28 — ABNORMAL LOW
Hemoglobin: 14
Hemoglobin: 10.5 — ABNORMAL LOW
Hemoglobin: 9.4 — ABNORMAL LOW
MCHC: 33.5
MCHC: 33.7
MCV: 87.7
MCV: 87.9
Platelets: 340
Platelets: 223
Platelets: 262
RDW: 13.6
RDW: 13.4
RDW: 13.7
WBC: 14.7 — ABNORMAL HIGH
WBC: 15.6 — ABNORMAL HIGH

## 2011-08-15 LAB — PROTIME-INR
INR: 1
Prothrombin Time: 13.8

## 2011-08-15 LAB — DIFFERENTIAL
Basophils Relative: 1
Eosinophils Absolute: 0.1
Lymphs Abs: 1.3
Monocytes Relative: 8
Neutro Abs: 4.3
Neutrophils Relative %: 69

## 2011-08-15 LAB — BASIC METABOLIC PANEL
BUN: 7
Calcium: 8.3 — ABNORMAL LOW
Chloride: 101
Chloride: 104
GFR calc non Af Amer: >60
GFR calc non Af Amer: >60
Glucose, Bld: 107 — ABNORMAL HIGH
Glucose, Bld: 116 — ABNORMAL HIGH
Glucose, Bld: 131 — ABNORMAL HIGH
Potassium: 3.3 — ABNORMAL LOW
Potassium: 3.7
Sodium: 133 — ABNORMAL LOW
Sodium: 135

## 2011-08-15 LAB — COMPREHENSIVE METABOLIC PANEL
ALT: 23
BUN: 12
CO2: 30
Calcium: 9.8
GFR calc non Af Amer: 58 — ABNORMAL LOW
Glucose, Bld: 83
Total Protein: 6.5

## 2011-08-15 LAB — URINALYSIS, ROUTINE W REFLEX MICROSCOPIC
Bilirubin Urine: NEGATIVE
Bilirubin Urine: NEGATIVE
Glucose, UA: NEGATIVE
Hgb urine dipstick: NEGATIVE
Hgb urine dipstick: NEGATIVE
Ketones, ur: NEGATIVE
Nitrite: NEGATIVE
Protein, ur: NEGATIVE
Protein, ur: NEGATIVE
Specific Gravity, Urine: 1.026
Urobilinogen, UA: 0.2
pH: 5.5

## 2011-08-15 LAB — CROSSMATCH: ABO/RH(D): A POS

## 2011-08-15 LAB — APTT: aPTT: 27

## 2011-10-24 ENCOUNTER — Ambulatory Visit (HOSPITAL_COMMUNITY): Payer: Medicare Other | Admitting: Oncology

## 2011-10-25 ENCOUNTER — Ambulatory Visit (HOSPITAL_COMMUNITY)
Admission: RE | Admit: 2011-10-25 | Discharge: 2011-10-25 | Disposition: A | Discharge status: Home / Self Care | Payer: Medicare Other | Source: Ambulatory Visit | Attending: Internal Medicine | Admitting: Internal Medicine

## 2011-10-25 ENCOUNTER — Other Ambulatory Visit (HOSPITAL_COMMUNITY): Payer: Self-pay | Admitting: Internal Medicine

## 2011-10-25 DIAGNOSIS — R059 Cough, unspecified: Secondary | ICD-10-CM | POA: Insufficient documentation

## 2011-10-25 DIAGNOSIS — J189 Pneumonia, unspecified organism: Secondary | ICD-10-CM | POA: Insufficient documentation

## 2011-10-25 DIAGNOSIS — R05 Cough: Secondary | ICD-10-CM | POA: Insufficient documentation

## 2011-12-07 ENCOUNTER — Encounter (HOSPITAL_COMMUNITY): Payer: Medicare Other | Attending: Oncology | Admitting: Oncology

## 2011-12-07 ENCOUNTER — Encounter (HOSPITAL_COMMUNITY): Payer: Self-pay | Admitting: Oncology

## 2011-12-07 VITALS — BP 154/77 | HR 81 | Temp 97.3°F | Ht 64.0 in | Wt 178.6 lb

## 2011-12-07 DIAGNOSIS — Z170 Estrogen receptor positive status [ER+]: Secondary | ICD-10-CM

## 2011-12-07 DIAGNOSIS — C50419 Malignant neoplasm of upper-outer quadrant of unspecified female breast: Secondary | ICD-10-CM

## 2011-12-07 DIAGNOSIS — L270 Generalized skin eruption due to drugs and medicaments taken internally: Secondary | ICD-10-CM

## 2011-12-07 DIAGNOSIS — C50919 Malignant neoplasm of unspecified site of unspecified female breast: Secondary | ICD-10-CM

## 2011-12-07 DIAGNOSIS — F411 Generalized anxiety disorder: Secondary | ICD-10-CM

## 2011-12-07 MED ORDER — LETROZOLE 2.5 MG PO TABS: 2.5000 mg | ORAL_TABLET | Freq: Every day | ORAL | Status: DC

## 2011-12-07 NOTE — Progress Notes (Signed)
CC:   Catalina Pizza, M.D. Currie Paris, M.D.  DIAGNOSES: 1. Right-sided multifocal infiltrating ductal carcinoma of the breast,     1.3 cm in size occurring at the 11 o'clock position and a 7 mm     cancer in the subareolar position status post mastectomy on     08/15/2006.  The largest cancer was ER positive 83%, PR positive     84%, HER-2/neu negative, Ki-67 marker low at 4% and the subareolar     cancer was ER positive at 48%, PR positive at 92%, Ki-67 marker 4%,     HER-2/neu negative as well.  She was tried on tamoxifen which she     did not tolerate and Aromasin which she could not tolerate due to     severe aching in her muscles and joints and then we tried her on     Femara which she finished in November of 2012. 2. 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 3. Right knee replacement September 2010. 4. Lumpectomy and radiation therapy in 2002 for ductal carcinoma in     situ of the left breast. 5. POSSIBLE TAMOXIFEN-INDUCED DRUG RASH. 6. Smoking history of 5-6 cigarettes a day for about 5 years, quitting     in 1977. 7. Fractured leg in 1990 on the left with arthroscopic surgery with     repair. 8. Reconstruction of the right breast by Dr. Odis Luster after a     mastectomy.  HISTORY:  Haley Peterson is here today and states that ever since she stopped the Femara she has been very nervous about her disease recurring.  She has some anxiety anyway, but this is a little bit more pronounced, she states.  She can hardly stop thinking about it, she states.  I did mention to her that tamoxifen 10-year data at the [facility] Conference and the fact that a couple of the other studies with Femara are ongoing.  REVIEW OF SYSTEMS:  Negative except for a little weight gain.  She weighed 162 when I met her.  She is now at 178 pounds.  She is exercising a little bit.  Probably needs to work a little bit more on her diet.  She knows that people who have ideal weight versus obese people,  and I would not qualify her as being too obese but her BMI is over 30 at this time, but people with obesity do not do as well with recurrences and have a higher recurrence rate.  So she is aware of these things.  PHYSICAL EXAMINATION:  Vital signs:  Besides her weight of 178 pounds and a BMI of 30.7, blood pressure 154/77, pulse 80 and regular, respiratory rate 16-18 and unlabored, temperature is normal.  She has no obvious skin nodularity on the right chest wall.  The left breast is negative with no obvious radiation therapy changes.  She has no adenopathy in the cervical, supraclavicular, infraclavicular, axillary, inguinal areas.  She has no hepatosplenomegaly.  Heart:  Shows a regular rhythm and rate without murmurs, rubs or gallops.  Lungs:  Clear to auscultation and percussion.  She has no obvious thyromegaly.  No peripheral edema.  She does tell me today she had pneumonia in December and I did go over that x-ray report which confirmed that.  She is finally starting to recover her energy.  As far as the breast cancer is concerned, there is no evidence for recurrence and I see no need to do blood work today.  She had  perfect blood work in August.  She would, however, strongly feel more comfortable if we can put her back on the Femara.  I think if we can extrapolate the 10-year tamoxifen data versus 5 years to the Femara, it is not the most unreasonable thing, so what I have agreed to do is to put her back on it but should the data for 10 years of Femara not be positive over the next 1 to 2 years when that data should be mature, then we will take her off of the drug or if she has side effects from it once we put her back on it.  I am not sure it is truly worth keeping her on that.  So she is agreeable to this.  I will see her in 12 months either way.  She will call me if she has any problems and we will see how she does.    ______________________________ Ladona Horns. Mariel Sleet,  MD ESN/MEDQ  D:  12/07/2011  T:  12/07/2011  Job:  [id number]

## 2011-12-07 NOTE — Progress Notes (Signed)
This office note has been dictated.

## 2011-12-07 NOTE — Patient Instructions (Signed)
Haley Peterson  [id number] 1946/09/08   [facility]  Discharge Instructions  RECOMMENDATIONS MADE BY THE CONSULTANT AND ANY TEST RESULTS WILL BE SENT TO YOUR REFERRING DOCTOR.   EXAM FINDINGS BY MD TODAY AND SIGNS AND SYMPTOMS TO REPORT TO CLINIC OR PRIMARY MD: You are doing well and Dr. Mariel Sleet will continue the Femara.  Report any lumps, bone pain or shortness of breath.  MEDICATIONS PRESCRIBED: Femara    SPECIAL INSTRUCTIONS/FOLLOW-UP: Return to Clinic :  1 year to see Dr. Mariel Sleet   I acknowledge that I have been informed and understand all the instructions given to me and received a copy. I do not have any more questions at this time, but understand that I may call the Specialty Clinic at [facility] at [phone] during business hours should I have any further questions or need assistance in obtaining follow-up care.    __________________________________________  _____________  __________ Signature of Patient or Authorized Representative            Date                   Time    __________________________________________ Nurse's Signature

## 2012-05-25 ENCOUNTER — Other Ambulatory Visit: Payer: Self-pay | Admitting: Internal Medicine

## 2012-05-25 DIAGNOSIS — Z1231 Encounter for screening mammogram for malignant neoplasm of breast: Secondary | ICD-10-CM

## 2012-05-25 DIAGNOSIS — Z9011 Acquired absence of right breast and nipple: Secondary | ICD-10-CM

## 2012-07-02 ENCOUNTER — Ambulatory Visit: Payer: Medicare Other

## 2012-07-10 ENCOUNTER — Ambulatory Visit
Admission: RE | Admit: 2012-07-10 | Discharge: 2012-07-10 | Disposition: A | Discharge status: Home / Self Care | Payer: Medicare Other | Source: Ambulatory Visit | Attending: Internal Medicine | Admitting: Internal Medicine

## 2012-07-10 DIAGNOSIS — Z1231 Encounter for screening mammogram for malignant neoplasm of breast: Secondary | ICD-10-CM

## 2012-07-10 DIAGNOSIS — Z9011 Acquired absence of right breast and nipple: Secondary | ICD-10-CM

## 2012-10-14 DIAGNOSIS — J189 Pneumonia, unspecified organism: Secondary | ICD-10-CM

## 2012-10-14 HISTORY — DX: Pneumonia, unspecified organism: J18.9

## 2012-12-05 ENCOUNTER — Ambulatory Visit (HOSPITAL_COMMUNITY): Payer: Medicare Other | Admitting: Oncology

## 2012-12-16 ENCOUNTER — Encounter (HOSPITAL_COMMUNITY): Payer: Self-pay

## 2013-01-07 ENCOUNTER — Encounter (HOSPITAL_COMMUNITY): Payer: Self-pay | Admitting: Oncology

## 2013-01-07 ENCOUNTER — Encounter (HOSPITAL_COMMUNITY): Payer: Medicare Other | Attending: Oncology | Admitting: Oncology

## 2013-01-07 VITALS — BP 118/68 | HR 87 | Temp 98.5°F | Resp 16 | Wt 183.5 lb

## 2013-01-07 DIAGNOSIS — Z9010 Acquired absence of unspecified breast and nipple: Secondary | ICD-10-CM

## 2013-01-07 DIAGNOSIS — C50419 Malignant neoplasm of upper-outer quadrant of unspecified female breast: Secondary | ICD-10-CM

## 2013-01-07 DIAGNOSIS — Z170 Estrogen receptor positive status [ER+]: Secondary | ICD-10-CM

## 2013-01-07 DIAGNOSIS — C50119 Malignant neoplasm of central portion of unspecified female breast: Secondary | ICD-10-CM

## 2013-01-07 DIAGNOSIS — Z853 Personal history of malignant neoplasm of breast: Secondary | ICD-10-CM

## 2013-01-07 MED ORDER — LETROZOLE 2.5 MG PO TABS: 2.5000 mg | ORAL_TABLET | Freq: Every day | ORAL | Status: DC

## 2013-01-07 MED ORDER — LETROZOLE 2.5 MG PO TABS: ORAL_TABLET | ORAL | Status: DC

## 2013-01-07 NOTE — Patient Instructions (Addendum)
[facility] Discharge Instructions  RECOMMENDATIONS MADE BY THE CONSULTANT AND ANY TEST RESULTS WILL BE SENT TO YOUR REFERRING PHYSICIAN.  EXAM FINDINGS BY THE PHYSICIAN TODAY AND SIGNS OR SYMPTOMS TO REPORT TO CLINIC OR PRIMARY PHYSICIAN: Exam and discussion by MD.  Haley Peterson are doing well.  No evidence of recurrence by exam.  Study has not been completed for continued treatment with Femara for 10 years.  You can take it for 1 more year.  MEDICATIONS PRESCRIBED:  none  INSTRUCTIONS GIVEN AND DISCUSSED: Report any new lumps, bone pain or shortness of breath.  SPECIAL INSTRUCTIONS/FOLLOW-UP: Follow-up in 1 year.  Thank you for choosing [facility] to provide your oncology and hematology care.  To afford each patient quality time with our providers, please arrive at least 15 minutes before your scheduled appointment time.  With your help, our goal is to use those 15 minutes to complete the necessary work-up to ensure our physicians have the information they need to help with your evaluation and healthcare recommendations.    Effective January 1st, 2014, we ask that you re-schedule your appointment with our physicians should you arrive 10 or more minutes late for your appointment.  We strive to give you quality time with our providers, and arriving late affects you and other patients whose appointments are after yours.    Again, thank you for choosing [facility].  Our hope is that these requests will decrease the amount of time that you wait before being seen by our physicians.       _____________________________________________________________  Should you have questions after your visit to [facility], please contact our office at [phone] between the hours of 8:30 a.m. and 5:00 p.m.  Voicemails left after 4:30 p.m. will not be returned until the following business day.  For prescription refill requests, have your pharmacy contact  our office with your prescription refill request.

## 2013-01-07 NOTE — Progress Notes (Signed)
#  1 infiltrating ductal carcinoma the right breast, 1.3 cm in size at the 11:00 position, and a second cancer 7 mm in size in the subareolar position status post modified radical mastectomy on 08/15/2006. Both cancers were ER positive and PR positive. The largest cancer was ER 83% positive, PR +84%, HER-2/neu nonamplified. Ki-67 marker low at 4%, and the subareolar cancer was ER +48%, PR +92%, HER-2/neu nonamplified, Ki-67 marker 4 percent. We tried her on tamoxifen which she could not tolerate, as well as Aromasin which she could not tolerate due to severe aching in her muscles and joints, and then letrozole which she took for 5 years ending in November 2012. We then restarted the drug a year ago. He became very anxious and had heard about the tamoxifen study and wanted to be on something for another 5 years. Since she could not tolerate tamoxifen I thought it is not unreasonable to consider the letrozole once again which she tolerates without issue.  #2 tamoxifen-induced drug rash, possible, prompting discontinuation #3 left knee replaced in October 2005 #4 right knee replacement September 2010 #5 minimal smoking history of 5-6 cigarettes a day for approximately 5 years quitting in 1977 #6 fractured her leg in 1990 on the left #7 Right breast reconstruction after mastectomy  Her oncology review of systems is noncontributory. She is upset about her mother who has become very demented in the last year and is now 77. She lives with the patient. Fortunately for my patient her brother also helps take care of her mother.  She has no complaints in the letrozole whatsoever.  BP 118/68  Pulse 87  Temp(Src) 98.5 F (36.9 C) (Oral)  Resp 16  Wt 183 lb 8 oz (83.235 kg)  BMI 31.48 kg/m2  She is in no acute distress. The right chest wall area is negative. The left breast is negative for masses. She has no lymphadenopathy in the cervical, supraclavicular, infraclavicular, axillary or inguinal areas. She has  no epitrochlear nodes either. She has no arm edema. She has no leg edema. Her lungs are clear to auscultation and percussion. Skin exam is negative for any obvious skin cancers. She is many benign lesions. Heart shows a regular rhythm and rate without murmur rub or gallop. Abdomen remains soft and nontender without hepatosplenomegaly. Bowel sounds are normal.  She does need a bone density end of July 2014. Otherwise we'll see her in a year but she will call us after her bone density. We will try to obtain her blood work from the other month from Dr. Margo Aye. We will continue to monitor for any further information from long-term hormonal therapy for breast cancers in general. I do not however want her to take this drug for more than 10 years total. She thinks she could take it easily the rest of her life to assuage her anxiety.

## 2013-06-03 ENCOUNTER — Other Ambulatory Visit: Payer: Self-pay

## 2013-06-03 DIAGNOSIS — Z9011 Acquired absence of right breast and nipple: Secondary | ICD-10-CM

## 2013-06-03 DIAGNOSIS — Z1231 Encounter for screening mammogram for malignant neoplasm of breast: Secondary | ICD-10-CM

## 2013-06-06 ENCOUNTER — Ambulatory Visit (HOSPITAL_COMMUNITY)
Admission: RE | Admit: 2013-06-06 | Discharge: 2013-06-06 | Disposition: A | Discharge status: Home / Self Care | Payer: Medicare Other | Source: Ambulatory Visit | Attending: Oncology | Admitting: Oncology

## 2013-06-06 DIAGNOSIS — M899 Disorder of bone, unspecified: Secondary | ICD-10-CM | POA: Insufficient documentation

## 2013-06-06 DIAGNOSIS — Z853 Personal history of malignant neoplasm of breast: Secondary | ICD-10-CM

## 2013-07-11 ENCOUNTER — Ambulatory Visit
Admission: RE | Admit: 2013-07-11 | Discharge: 2013-07-11 | Disposition: A | Discharge status: Home / Self Care | Payer: Medicare Other | Source: Ambulatory Visit

## 2013-07-11 DIAGNOSIS — Z1231 Encounter for screening mammogram for malignant neoplasm of breast: Secondary | ICD-10-CM

## 2013-07-11 DIAGNOSIS — Z9011 Acquired absence of right breast and nipple: Secondary | ICD-10-CM

## 2013-08-06 ENCOUNTER — Other Ambulatory Visit (HOSPITAL_COMMUNITY): Payer: Self-pay | Admitting: Oncology

## 2014-01-07 ENCOUNTER — Ambulatory Visit (HOSPITAL_COMMUNITY): Payer: Medicare Other

## 2014-01-08 NOTE — Progress Notes (Signed)
This encounter was created in error - please disregard.

## 2014-01-09 ENCOUNTER — Encounter (HOSPITAL_COMMUNITY): Payer: Self-pay

## 2014-06-05 ENCOUNTER — Other Ambulatory Visit: Payer: Self-pay

## 2014-06-05 DIAGNOSIS — Z1231 Encounter for screening mammogram for malignant neoplasm of breast: Secondary | ICD-10-CM

## 2014-07-14 ENCOUNTER — Ambulatory Visit
Admission: RE | Admit: 2014-07-14 | Discharge: 2014-07-14 | Disposition: A | Discharge status: Home / Self Care | Payer: Medicare Other | Source: Ambulatory Visit

## 2014-07-14 DIAGNOSIS — Z1231 Encounter for screening mammogram for malignant neoplasm of breast: Secondary | ICD-10-CM

## 2014-12-31 ENCOUNTER — Other Ambulatory Visit (HOSPITAL_COMMUNITY): Payer: Self-pay | Admitting: Internal Medicine

## 2014-12-31 ENCOUNTER — Ambulatory Visit (HOSPITAL_COMMUNITY)
Admission: RE | Admit: 2014-12-31 | Discharge: 2014-12-31 | Disposition: A | Discharge status: Home / Self Care | Payer: PPO | Source: Ambulatory Visit | Attending: Internal Medicine | Admitting: Internal Medicine

## 2014-12-31 DIAGNOSIS — Y92019 Unspecified place in single-family (private) house as the place of occurrence of the external cause: Secondary | ICD-10-CM | POA: Insufficient documentation

## 2014-12-31 DIAGNOSIS — W19XXXA Unspecified fall, initial encounter: Secondary | ICD-10-CM

## 2014-12-31 DIAGNOSIS — M25531 Pain in right wrist: Secondary | ICD-10-CM | POA: Diagnosis present

## 2014-12-31 DIAGNOSIS — S52501A Unspecified fracture of the lower end of right radius, initial encounter for closed fracture: Secondary | ICD-10-CM | POA: Diagnosis not present

## 2014-12-31 DIAGNOSIS — W1809XA Striking against other object with subsequent fall, initial encounter: Secondary | ICD-10-CM | POA: Insufficient documentation

## 2014-12-31 DIAGNOSIS — S52091A Other fracture of upper end of right ulna, initial encounter for closed fracture: Secondary | ICD-10-CM | POA: Insufficient documentation

## 2015-01-01 ENCOUNTER — Ambulatory Visit (INDEPENDENT_AMBULATORY_CARE_PROVIDER_SITE_OTHER): Payer: PPO | Admitting: Orthopedic Surgery

## 2015-01-01 ENCOUNTER — Encounter: Payer: Self-pay | Admitting: Orthopedic Surgery

## 2015-01-01 VITALS — BP 133/64 | Ht 64.0 in | Wt 183.5 lb

## 2015-01-01 DIAGNOSIS — S52531A Colles' fracture of right radius, initial encounter for closed fracture: Secondary | ICD-10-CM

## 2015-01-01 NOTE — Progress Notes (Signed)
Patient ID: Haley Peterson, female   DOB: 09/16/1946, 69 y.o.   MRN: [id number]  Chief Complaint  Patient presents with  . Wrist Injury    Right wrist fracture, DOI 12/30/14, RE[facility]    HPI Haley Peterson is a 69 y.o. female.   Presents with history of fall at her home tripped over a cord she fractured her right distal radius she is right-hand dominant  HPI  complains of mild to moderate discomfort. Decreased range of motion. Decreased in functional abilities with right upper extremity. Pain is constant dull throbbing aching. Review of Systems Review of Systems   she reports history of cataract surgery in the left eye for vision disturbance , sinus drainage January 2016 otherwise normal Past Medical History  Diagnosis Date  . History of breast cancer     left, right  . Arthritis   . Bone spur     left heel  . Cancer   . Pneumonia 10/2012    Past Surgical History  Procedure Laterality Date  . Colonoscopy    . Tissue expander placement    . Breast reconstruction    . Replacement total knee  2009, 2010    right-2010, left 2009  . Cholecystectomy  2002  . Partial hysterectomy  1985  . Breast lumpectomy  08/28/2001    left  . Mastectomy  08/15/2006    right  . Abdominal hysterectomy      Family History  Problem Relation Age of Onset  . Arthritis Mother   . Thyroid cancer Brother     2010    Social History History  Substance Use Topics  . Smoking status: Never Smoker   . Smokeless tobacco: Never Used  . Alcohol Use: No    Allergies  Allergen Reactions  . Aromasin [Exemestane]   . Codeine   . Penicillins   . Tamoxifen     Current Outpatient Prescriptions  Medication Sig Dispense Refill  . Calcium-Vitamin D (CALTRATE 600 PLUS-VIT D PO) Take by mouth daily.      . cholecalciferol (VITAMIN D) 1000 UNITS tablet Take 1,000 Units by mouth daily.    . Garlic (GARLIQUE PO) Take by mouth daily.      Marland Kitchen letrozole (FEMARA) 2.5 MG tablet TAKE ONE TABLET DAILY. 30 tablet 5   . losartan (COZAAR) 100 MG tablet Take 100 mg by mouth daily.      No current facility-administered medications for this visit.       Physical Exam Blood pressure 133/64, height 5\' 4"  (1.626 m), weight 183 lb 8 oz (83.235 kg). Physical Exam The patient is well developed well nourished and well groomed. Orientation to person place and time is normal  Mood is pleasant. Ambulatory status  normal  left wrist normal  Right wrist tenderness and swelling over the right distal radius no tenderness over the elbow or shoulder. Wrist and shoulder joint are stable muscle tone is normal skin shows no rash good distal pulses noted no lymphadenopathy. Sensory exam remains normal  Data Reviewed  hospital imaging shows a nondisplaced fracture of the distal radius and ulnar styloid fracture which is not clinically significant  Assessment    Encounter Diagnosis  Name Primary?  . Colles' fracture of right radius, closed, initial encounter Yes        Plan     short arm cast is applied posttreatment 5 weeks x-ray out of plaster

## 2015-01-02 ENCOUNTER — Other Ambulatory Visit: Payer: Self-pay | Admitting: *Deleted

## 2015-01-02 MED ORDER — HYDROCODONE-ACETAMINOPHEN 5-325 MG PO TABS: 1.0000 | ORAL_TABLET | Freq: Four times a day (QID) | ORAL | Status: DC | PRN

## 2015-02-05 ENCOUNTER — Ambulatory Visit (INDEPENDENT_AMBULATORY_CARE_PROVIDER_SITE_OTHER): Payer: PPO

## 2015-02-05 ENCOUNTER — Ambulatory Visit (INDEPENDENT_AMBULATORY_CARE_PROVIDER_SITE_OTHER): Payer: Self-pay | Admitting: Orthopedic Surgery

## 2015-02-05 VITALS — Ht 64.0 in | Wt 183.0 lb

## 2015-02-05 DIAGNOSIS — S62101D Fracture of unspecified carpal bone, right wrist, subsequent encounter for fracture with routine healing: Secondary | ICD-10-CM | POA: Diagnosis not present

## 2015-02-05 NOTE — Progress Notes (Signed)
Subjective:     Haley Peterson is a 69 y.o. right hand-dominant female who returns for follow-up of a right hand (distal radius) fracture. The injury occurred 5 weeks ago. She reports rubbing of the skin at the right index finger problems with the cast or injury, and no problems with swelling, numbness, or tingling in her hand or wrist.  Noncontributory     Objective:    General:   alert and cooperative  Gait:    Normal  Right  hand and wrist Circulation:   warm, well perfused, brisk capillary refill distal to the injury  Skin:   abrasion, superficial right index finger   Swelling:  absent  Deformity:  There is not an obvious deformity of the hand  Finger ROM:   normal  Sensation:   intact to light touch  Tenderness:    Point tenderness none   Imaging X-ray : Normal callus formation at fracture site with acceptable alignment.    Assessment:   AssessmentResolving fracture without complication.    Plan:       Cast removed  Hand exercises demonstrated explained and performed she is to do twice a day   wrist splint applied  Follow up will be in 4-5  we final checkupeks for cast check and reevaluation with xrays.

## 2015-03-09 ENCOUNTER — Ambulatory Visit (INDEPENDENT_AMBULATORY_CARE_PROVIDER_SITE_OTHER): Payer: Self-pay | Admitting: Orthopedic Surgery

## 2015-03-09 ENCOUNTER — Encounter: Payer: Self-pay | Admitting: Orthopedic Surgery

## 2015-03-09 ENCOUNTER — Ambulatory Visit (INDEPENDENT_AMBULATORY_CARE_PROVIDER_SITE_OTHER): Payer: PPO

## 2015-03-09 VITALS — BP 114/60 | Ht 64.0 in | Wt 183.0 lb

## 2015-03-09 DIAGNOSIS — S62101D Fracture of unspecified carpal bone, right wrist, subsequent encounter for fracture with routine healing: Secondary | ICD-10-CM

## 2015-03-09 NOTE — Progress Notes (Signed)
Fracture care follow-up  Chief Complaint  Patient presents with  . Follow-up    4 week follow up Right wrist fracture care, DOI 12/30/14    68 days post injury treated with a cast. She is in a brace now. X-rays today show good volar tilt with neutral joint alignment and minimal shortening. On exam she can supinate the forearm with some ulnar-sided pain.  Recommend home exercises follow-up 6 weeks

## 2015-03-09 NOTE — Patient Instructions (Signed)
Brace can be removed but may be worn as needed for extra support for strenuous activity  Do exercises daily for 6 weeks   Activities of daily living as tolerated

## 2015-03-11 ENCOUNTER — Other Ambulatory Visit (HOSPITAL_COMMUNITY): Payer: Self-pay | Admitting: Oncology

## 2015-03-11 DIAGNOSIS — M8580 Other specified disorders of bone density and structure, unspecified site: Secondary | ICD-10-CM

## 2015-03-11 DIAGNOSIS — C50911 Malignant neoplasm of unspecified site of right female breast: Secondary | ICD-10-CM

## 2015-03-16 ENCOUNTER — Telehealth: Payer: Self-pay | Admitting: Orthopedic Surgery

## 2015-03-16 NOTE — Telephone Encounter (Signed)
Routing to Dr Harrison 

## 2015-03-16 NOTE — Telephone Encounter (Signed)
Yes to all. 

## 2015-03-16 NOTE — Telephone Encounter (Signed)
Patient aware.

## 2015-03-16 NOTE — Telephone Encounter (Signed)
Patient is asking if it is ok to start using had weights (5lbs) and also is ok to use the weighted machines? Also grip saver plus balls for strengthening exercises she got those at [facility] at [place], Please advise?

## 2015-04-21 ENCOUNTER — Ambulatory Visit (INDEPENDENT_AMBULATORY_CARE_PROVIDER_SITE_OTHER): Payer: PPO | Admitting: Orthopedic Surgery

## 2015-04-21 ENCOUNTER — Encounter: Payer: Self-pay | Admitting: Orthopedic Surgery

## 2015-04-21 VITALS — BP 129/61 | Ht 64.0 in | Wt 186.0 lb

## 2015-04-21 DIAGNOSIS — S62101D Fracture of unspecified carpal bone, right wrist, subsequent encounter for fracture with routine healing: Secondary | ICD-10-CM | POA: Diagnosis not present

## 2015-04-21 NOTE — Progress Notes (Signed)
Patient ID: Haley Peterson, female   DOB: January 04, 1946, 69 y.o.   MRN: [id number] Chief Complaint  Patient presents with  . Follow-up    6 week follow up Right wrist fx, DOI 12/30/14    The patient broke her right wrist we treated it with a cast. She came in for follow-up visit for final checkup. She is doing well she has some pain at the fracture site when she load bears such as getting out of a chair or getting up from a seated position  Other than that her wrist looks good her motion is good she had some mild tenderness where the fracture was. No other abnormalities  Follow-up as needed

## 2015-06-08 ENCOUNTER — Ambulatory Visit (HOSPITAL_COMMUNITY)
Admission: RE | Admit: 2015-06-08 | Discharge: 2015-06-08 | Disposition: A | Discharge status: Home / Self Care | Payer: PPO | Source: Ambulatory Visit | Attending: Oncology | Admitting: Oncology

## 2015-06-08 DIAGNOSIS — C50911 Malignant neoplasm of unspecified site of right female breast: Secondary | ICD-10-CM | POA: Insufficient documentation

## 2015-06-08 DIAGNOSIS — M8580 Other specified disorders of bone density and structure, unspecified site: Secondary | ICD-10-CM | POA: Diagnosis not present

## 2015-06-08 DIAGNOSIS — Z780 Asymptomatic menopausal state: Secondary | ICD-10-CM | POA: Diagnosis not present

## 2015-06-22 ENCOUNTER — Other Ambulatory Visit: Payer: Self-pay

## 2015-06-22 DIAGNOSIS — Z1231 Encounter for screening mammogram for malignant neoplasm of breast: Secondary | ICD-10-CM

## 2015-07-30 ENCOUNTER — Ambulatory Visit
Admission: RE | Admit: 2015-07-30 | Discharge: 2015-07-30 | Disposition: A | Discharge status: Home / Self Care | Payer: PPO | Source: Ambulatory Visit

## 2015-07-30 DIAGNOSIS — Z1231 Encounter for screening mammogram for malignant neoplasm of breast: Secondary | ICD-10-CM

## 2015-09-04 ENCOUNTER — Encounter (INDEPENDENT_AMBULATORY_CARE_PROVIDER_SITE_OTHER): Payer: Self-pay | Admitting: *Deleted

## 2016-01-25 DIAGNOSIS — R05 Cough: Secondary | ICD-10-CM | POA: Diagnosis not present

## 2016-01-25 DIAGNOSIS — J00 Acute nasopharyngitis [common cold]: Secondary | ICD-10-CM | POA: Diagnosis not present

## 2016-03-14 DIAGNOSIS — Z170 Estrogen receptor positive status [ER+]: Secondary | ICD-10-CM | POA: Diagnosis not present

## 2016-03-14 DIAGNOSIS — C50919 Malignant neoplasm of unspecified site of unspecified female breast: Secondary | ICD-10-CM | POA: Diagnosis not present

## 2016-03-14 DIAGNOSIS — M8580 Other specified disorders of bone density and structure, unspecified site: Secondary | ICD-10-CM | POA: Diagnosis not present

## 2016-06-27 ENCOUNTER — Other Ambulatory Visit: Payer: Self-pay | Admitting: Oncology

## 2016-06-27 DIAGNOSIS — Z1231 Encounter for screening mammogram for malignant neoplasm of breast: Secondary | ICD-10-CM

## 2016-08-03 ENCOUNTER — Ambulatory Visit
Admission: RE | Admit: 2016-08-03 | Discharge: 2016-08-03 | Disposition: A | Discharge status: Home / Self Care | Payer: PPO | Source: Ambulatory Visit | Attending: Oncology | Admitting: Oncology

## 2016-08-03 DIAGNOSIS — Z1231 Encounter for screening mammogram for malignant neoplasm of breast: Secondary | ICD-10-CM | POA: Diagnosis not present

## 2016-08-22 DIAGNOSIS — Z23 Encounter for immunization: Secondary | ICD-10-CM | POA: Diagnosis not present

## 2016-09-07 DIAGNOSIS — E782 Mixed hyperlipidemia: Secondary | ICD-10-CM | POA: Diagnosis not present

## 2016-09-22 DIAGNOSIS — Z6832 Body mass index (BMI) 32.0-32.9, adult: Secondary | ICD-10-CM | POA: Diagnosis not present

## 2016-09-22 DIAGNOSIS — E875 Hyperkalemia: Secondary | ICD-10-CM | POA: Diagnosis not present

## 2016-09-22 DIAGNOSIS — I10 Essential (primary) hypertension: Secondary | ICD-10-CM | POA: Diagnosis not present

## 2016-09-22 DIAGNOSIS — M25559 Pain in unspecified hip: Secondary | ICD-10-CM | POA: Diagnosis not present

## 2016-09-22 DIAGNOSIS — R944 Abnormal results of kidney function studies: Secondary | ICD-10-CM | POA: Diagnosis not present

## 2016-09-22 DIAGNOSIS — M545 Low back pain: Secondary | ICD-10-CM | POA: Diagnosis not present

## 2016-09-22 DIAGNOSIS — C50919 Malignant neoplasm of unspecified site of unspecified female breast: Secondary | ICD-10-CM | POA: Diagnosis not present

## 2016-09-28 ENCOUNTER — Encounter (INDEPENDENT_AMBULATORY_CARE_PROVIDER_SITE_OTHER): Payer: Self-pay

## 2016-09-28 ENCOUNTER — Encounter (INDEPENDENT_AMBULATORY_CARE_PROVIDER_SITE_OTHER): Payer: Self-pay | Admitting: *Deleted

## 2016-10-20 DIAGNOSIS — G629 Polyneuropathy, unspecified: Secondary | ICD-10-CM | POA: Diagnosis not present

## 2016-10-20 DIAGNOSIS — R202 Paresthesia of skin: Secondary | ICD-10-CM | POA: Diagnosis not present

## 2016-10-20 DIAGNOSIS — I10 Essential (primary) hypertension: Secondary | ICD-10-CM | POA: Diagnosis not present

## 2016-10-20 DIAGNOSIS — E875 Hyperkalemia: Secondary | ICD-10-CM | POA: Diagnosis not present

## 2016-10-20 DIAGNOSIS — R944 Abnormal results of kidney function studies: Secondary | ICD-10-CM | POA: Diagnosis not present

## 2016-10-26 DIAGNOSIS — H43393 Other vitreous opacities, bilateral: Secondary | ICD-10-CM | POA: Diagnosis not present

## 2016-10-26 DIAGNOSIS — H52223 Regular astigmatism, bilateral: Secondary | ICD-10-CM | POA: Diagnosis not present

## 2016-10-26 DIAGNOSIS — H43813 Vitreous degeneration, bilateral: Secondary | ICD-10-CM | POA: Diagnosis not present

## 2016-10-26 DIAGNOSIS — H5203 Hypermetropia, bilateral: Secondary | ICD-10-CM | POA: Diagnosis not present

## 2016-10-26 DIAGNOSIS — H25813 Combined forms of age-related cataract, bilateral: Secondary | ICD-10-CM | POA: Diagnosis not present

## 2016-10-26 DIAGNOSIS — H524 Presbyopia: Secondary | ICD-10-CM | POA: Diagnosis not present

## 2016-11-11 DIAGNOSIS — Z0000 Encounter for general adult medical examination without abnormal findings: Secondary | ICD-10-CM | POA: Diagnosis not present

## 2016-12-12 ENCOUNTER — Encounter (INDEPENDENT_AMBULATORY_CARE_PROVIDER_SITE_OTHER): Payer: Self-pay | Admitting: *Deleted

## 2016-12-30 ENCOUNTER — Other Ambulatory Visit (INDEPENDENT_AMBULATORY_CARE_PROVIDER_SITE_OTHER): Payer: Self-pay | Admitting: *Deleted

## 2016-12-30 ENCOUNTER — Encounter (INDEPENDENT_AMBULATORY_CARE_PROVIDER_SITE_OTHER): Payer: Self-pay | Admitting: *Deleted

## 2016-12-30 DIAGNOSIS — Z1211 Encounter for screening for malignant neoplasm of colon: Secondary | ICD-10-CM

## 2017-02-07 DIAGNOSIS — M25552 Pain in left hip: Secondary | ICD-10-CM | POA: Diagnosis not present

## 2017-02-07 DIAGNOSIS — M25562 Pain in left knee: Secondary | ICD-10-CM | POA: Diagnosis not present

## 2017-02-07 DIAGNOSIS — M1611 Unilateral primary osteoarthritis, right hip: Secondary | ICD-10-CM | POA: Diagnosis not present

## 2017-02-07 DIAGNOSIS — M4608 Spinal enthesopathy, sacral and sacrococcygeal region: Secondary | ICD-10-CM | POA: Diagnosis not present

## 2017-02-07 DIAGNOSIS — M160 Bilateral primary osteoarthritis of hip: Secondary | ICD-10-CM | POA: Diagnosis not present

## 2017-02-07 DIAGNOSIS — M25551 Pain in right hip: Secondary | ICD-10-CM | POA: Diagnosis not present

## 2017-02-07 DIAGNOSIS — M47898 Other spondylosis, sacral and sacrococcygeal region: Secondary | ICD-10-CM | POA: Diagnosis not present

## 2017-02-07 DIAGNOSIS — M5137 Other intervertebral disc degeneration, lumbosacral region: Secondary | ICD-10-CM | POA: Diagnosis not present

## 2017-02-07 DIAGNOSIS — Z96653 Presence of artificial knee joint, bilateral: Secondary | ICD-10-CM | POA: Diagnosis not present

## 2017-02-07 DIAGNOSIS — M25561 Pain in right knee: Secondary | ICD-10-CM | POA: Diagnosis not present

## 2017-02-07 DIAGNOSIS — Z96652 Presence of left artificial knee joint: Secondary | ICD-10-CM | POA: Diagnosis not present

## 2017-02-17 ENCOUNTER — Telehealth (INDEPENDENT_AMBULATORY_CARE_PROVIDER_SITE_OTHER): Payer: Self-pay | Admitting: *Deleted

## 2017-02-17 ENCOUNTER — Encounter (INDEPENDENT_AMBULATORY_CARE_PROVIDER_SITE_OTHER): Payer: Self-pay | Admitting: *Deleted

## 2017-02-17 MED ORDER — PEG 3350-KCL-NA BICARB-NACL 420 G PO SOLR: 4000.0000 mL | Freq: Once | ORAL | 0 refills | Status: AC

## 2017-02-17 NOTE — Telephone Encounter (Signed)
Patient needs trilyte 

## 2017-03-14 DIAGNOSIS — M1611 Unilateral primary osteoarthritis, right hip: Secondary | ICD-10-CM | POA: Diagnosis not present

## 2017-03-14 DIAGNOSIS — M1612 Unilateral primary osteoarthritis, left hip: Secondary | ICD-10-CM | POA: Diagnosis not present

## 2017-03-15 ENCOUNTER — Telehealth (INDEPENDENT_AMBULATORY_CARE_PROVIDER_SITE_OTHER): Payer: Self-pay | Admitting: *Deleted

## 2017-03-15 DIAGNOSIS — M160 Bilateral primary osteoarthritis of hip: Secondary | ICD-10-CM | POA: Diagnosis not present

## 2017-03-15 NOTE — Telephone Encounter (Signed)
Referring MD/PCP: [facility]   Procedure: tcs  Reason/Indication:  screening  Has patient had this procedure before?  Yes, 12 yrs ago  If so, when, by whom and where?    Is there a family history of colon cancer?  no  Who?  What age when diagnosed?    Is patient diabetic?   no      Does patient have prosthetic heart valve or mechanical valve?  no  Do you have a pacemaker?  no  Has patient ever had endocarditis? no  Has patient had joint replacement within last 12 months?  no  Does patient tend to be constipated or take laxatives? no  Does patient have a history of alcohol/drug use?  no  Is patient on Coumadin, Plavix and/or Aspirin? no  Medications: sleve daily, losartan/hctz 100/12.5 mg daily, vit d3 daily, garlique daily, caltrate daily  Allergies: pcn  Medication Adjustment per Dr Laural Golden:   Procedure date & time: 04/12/17 at 830

## 2017-03-16 NOTE — Telephone Encounter (Signed)
agree

## 2017-03-28 DIAGNOSIS — M160 Bilateral primary osteoarthritis of hip: Secondary | ICD-10-CM | POA: Diagnosis not present

## 2017-04-12 ENCOUNTER — Encounter (HOSPITAL_COMMUNITY): Payer: Self-pay | Admitting: *Deleted

## 2017-04-12 ENCOUNTER — Ambulatory Visit (HOSPITAL_COMMUNITY)
Admission: RE | Admit: 2017-04-12 | Discharge: 2017-04-12 | Disposition: A | Discharge status: Home / Self Care | Payer: PPO | Source: Ambulatory Visit | Attending: Internal Medicine | Admitting: Internal Medicine

## 2017-04-12 ENCOUNTER — Encounter (HOSPITAL_COMMUNITY)
Admission: RE | Disposition: A | Discharge status: Home / Self Care | Payer: Self-pay | Source: Ambulatory Visit | Attending: Internal Medicine

## 2017-04-12 DIAGNOSIS — K635 Polyp of colon: Secondary | ICD-10-CM | POA: Diagnosis not present

## 2017-04-12 DIAGNOSIS — F17210 Nicotine dependence, cigarettes, uncomplicated: Secondary | ICD-10-CM | POA: Diagnosis not present

## 2017-04-12 DIAGNOSIS — Z880 Allergy status to penicillin: Secondary | ICD-10-CM | POA: Insufficient documentation

## 2017-04-12 DIAGNOSIS — Z853 Personal history of malignant neoplasm of breast: Secondary | ICD-10-CM | POA: Diagnosis not present

## 2017-04-12 DIAGNOSIS — I10 Essential (primary) hypertension: Secondary | ICD-10-CM | POA: Insufficient documentation

## 2017-04-12 DIAGNOSIS — Z79899 Other long term (current) drug therapy: Secondary | ICD-10-CM | POA: Diagnosis not present

## 2017-04-12 DIAGNOSIS — Z1211 Encounter for screening for malignant neoplasm of colon: Secondary | ICD-10-CM | POA: Diagnosis not present

## 2017-04-12 DIAGNOSIS — R633 Feeding difficulties: Secondary | ICD-10-CM | POA: Diagnosis not present

## 2017-04-12 DIAGNOSIS — Z808 Family history of malignant neoplasm of other organs or systems: Secondary | ICD-10-CM | POA: Insufficient documentation

## 2017-04-12 HISTORY — PX: BIOPSY: SHX5522

## 2017-04-12 HISTORY — PX: COLONOSCOPY: SHX5424

## 2017-04-12 HISTORY — DX: Essential (primary) hypertension: I10

## 2017-04-12 SURGERY — COLONOSCOPY
Anesthesia: Moderate Sedation

## 2017-04-12 MED ORDER — MIDAZOLAM HCL 5 MG/5ML IJ SOLN
INTRAMUSCULAR | Status: DC | PRN
  Administered 2017-04-12 (×3): 1 mg via INTRAVENOUS
  Administered 2017-04-12: 2 mg via INTRAVENOUS
  Administered 2017-04-12: 1 mg via INTRAVENOUS
  Administered 2017-04-12: 2 mg via INTRAVENOUS

## 2017-04-12 MED ORDER — MEPERIDINE HCL 50 MG/ML IJ SOLN
INTRAMUSCULAR | Status: DC | PRN
  Administered 2017-04-12 (×2): 25 mg via INTRAVENOUS

## 2017-04-12 MED ORDER — MIDAZOLAM HCL 5 MG/5ML IJ SOLN
INTRAMUSCULAR | Status: AC
  Filled 2017-04-12: qty 10

## 2017-04-12 MED ORDER — MEPERIDINE HCL 50 MG/ML IJ SOLN
INTRAMUSCULAR | Status: AC
  Filled 2017-04-12: qty 1

## 2017-04-12 MED ORDER — SODIUM CHLORIDE 0.9 % IV SOLN
INTRAVENOUS | Status: DC
  Administered 2017-04-12: 08:00:00 via INTRAVENOUS

## 2017-04-12 NOTE — H&P (Signed)
Haley Peterson is an 71 y.o. female.   Chief Complaint: Patient is here for colonoscopy. HPI: Patient is 71 year old Caucasian female who is here for screening colonoscopy. She denies abdominal pain change in bowel habits or rectal bleeding. Last colonoscopy was normal 12 years ago. Personal history significant for breast carcinoma and she remains in remission. Family history is negative for CRC.  Past Medical History:  Diagnosis Date  . Arthritis   . Bone spur    left heel  . Cancer ([place])   . History of breast cancer    left, right  . Hypertension   . Pneumonia 10/2012    Past Surgical History:  Procedure Laterality Date  . ABDOMINAL HYSTERECTOMY    . BREAST LUMPECTOMY  08/28/2001   left  . BREAST RECONSTRUCTION    . CHOLECYSTECTOMY  2002  . COLONOSCOPY    . MASTECTOMY  08/15/2006   right  . PARTIAL HYSTERECTOMY  1985  . REPLACEMENT TOTAL KNEE  2009, 2010   right-2010, left 2009  . TISSUE EXPANDER PLACEMENT      Family History  Problem Relation Age of Onset  . Arthritis Mother   . Thyroid cancer Brother        2010  . Colon cancer Neg Hx    Social History:  reports that she has quit smoking. Her smoking use included Cigarettes. She has a 2.50 pack-year smoking history. She has never used smokeless tobacco. She reports that she does not drink alcohol or use drugs.  Allergies:  Allergies  Allergen Reactions  . Oxytetracycline Other (See Comments)    Fever blisters  . Aromasin [Exemestane] Other (See Comments)    Pt is unsure of what reaction was...  . Codeine     LIGHTHEADED  . Penicillins Other (See Comments)    Lost of vision Has patient had a PCN reaction causing immediate rash, facial/tongue/throat swelling, SOB or lightheadedness with hypotension: No Has patient had a PCN reaction causing severe rash involving mucus membranes or skin necrosis: No Has patient had a PCN reaction that required hospitalization: No Has patient had a PCN reaction occurring within  the last 10 years: No If all of the above answers are "NO", then may proceed with Cephalosporin use.   . Tamoxifen Other (See Comments)    "GENERAL ILLNESS WITH LIGHTHEADNESS"     Medications Prior to Admission  Medication Sig Dispense Refill  . Calcium-Vitamin D (CALTRATE 600 PLUS-VIT D PO) Take 1 tablet by mouth daily with supper.     . cholecalciferol (VITAMIN D) 1000 UNITS tablet Take 1,000 Units by mouth daily with supper.     . Garlic (GARLIQUE PO) Take 1 tablet by mouth daily with supper.     . losartan-hydrochlorothiazide (HYZAAR) 100-12.5 MG tablet Take 1 tablet by mouth daily with breakfast.    . naproxen sodium (ANAPROX) 220 MG tablet Take 660 mg by mouth daily.    . TURMERIC CURCUMIN PO Take 2 tablets by mouth daily with supper.      No results found for this or any previous visit (from the past 48 hour(s)). No results found.  ROS  Blood pressure (!) 141/59, pulse (!) 104, temperature 98.6 F (37 C), temperature source Oral, resp. rate 13, height 5\' 4"  (1.626 m), weight 196 lb (88.9 kg), SpO2 97 %. Physical Exam  Constitutional: She appears well-developed and well-nourished.  HENT:  Mouth/Throat: Oropharynx is clear and moist.  Eyes: Conjunctivae are normal. No scleral icterus.  Neck: No thyromegaly present.  Cardiovascular: Normal rate, regular rhythm and normal heart sounds.   No murmur heard. Respiratory: Effort normal and breath sounds normal.  GI: Soft. She exhibits no distension and no mass. There is no tenderness.  Musculoskeletal: She exhibits no edema.  Lymphadenopathy:    She has no cervical adenopathy.  Neurological: She is alert.  Skin: Skin is warm and dry.     Assessment/Plan Average risk screening colonoscopy.  Hildred Laser, MD 04/12/2017, 8:08 AM

## 2017-04-12 NOTE — Op Note (Signed)
[facility] Patient Name: Haley Peterson Procedure Date: 04/12/2017 7:58 AM MRN: [id number] Date of Birth: [birth year] Attending MD: Hildred Laser , MD CSN: [id number] Age: 71 Admit Type: Outpatient Procedure:                Colonoscopy Indications:              Screening for colorectal malignant neoplasm Providers:                Hildred Laser, MD, Janeece Riggers, RN, Lurline Del, RN Referring MD:              Medicines:                Meperidine 50 mg IV, Midazolam 8 mg IV Complications:            No immediate complications. Estimated Blood Loss:     Estimated blood loss was minimal. Procedure:                Pre-Anesthesia Assessment:                           - Prior to the procedure, a History and Physical                            was performed, and patient medications and                            allergies were reviewed. The patient's tolerance of                            previous anesthesia was also reviewed. The risks                            and benefits of the procedure and the sedation                            options and risks were discussed with the patient.                            All questions were answered, and informed consent                            was obtained. Prior Anticoagulants: The patient                            last took naproxen 1 day prior to the procedure.                            ASA Grade Assessment: II - A patient with mild                            systemic disease. After reviewing the risks and                            benefits, the patient was deemed in satisfactory  condition to undergo the procedure.                           After obtaining informed consent, the colonoscope                            was passed under direct vision. Throughout the                            procedure, the patient's blood pressure, pulse, and                            oxygen saturations were monitored continuously. The                             EC-[id number] ([id number]) scope was introduced through                            the anus and advanced to the the cecum, identified                            by appendiceal orifice and ileocecal valve. The                            colonoscopy was somewhat difficult due to                            restricted mobility of the colon. Successful                            completion of the procedure was aided by increasing                            the dose of sedation medication, changing the                            patient to a supine position, using manual pressure                            and withdrawing and reinserting the scope. The                            patient tolerated the procedure well. The quality                            of the bowel preparation was excellent. The                            ileocecal valve, appendiceal orifice, and rectum                            were photographed. Scope In: 8:20:27 AM Scope Out: 8:42:44 AM Scope Withdrawal Time: 0 hours 8 minutes 22 seconds  Total Procedure Duration: 0 hours 22  minutes 17 seconds  Findings:      The perianal and digital rectal examinations were normal.      A single (solitary) eight mm ulcer was found in the distal ascending       colon. No bleeding was present. Biopsies were taken with a cold forceps       for histology.      The exam was otherwise without abnormality.      The retroflexed view of the distal rectum and anal verge was normal and       showed no anal or rectal abnormalities. Impression:               - A single (solitary) ulcer in the distal ascending                            colon. Biopsied.                           - The examination was otherwise normal.                           Comment: Ulcer appears benign and most likely due                            to NSAID use. Moderate Sedation:      Moderate (conscious) sedation was administered by the endoscopy nurse        and supervised by the endoscopist. The following parameters were       monitored: oxygen saturation, heart rate, blood pressure, CO2       capnography and response to care. Total physician intraservice time was       27 minutes. Recommendation:           - Patient has a contact number available for                            emergencies. The signs and symptoms of potential                            delayed complications were discussed with the                            patient. Return to normal activities tomorrow.                            Written discharge instructions were provided to the                            patient.                           - Resume previous diet today.                           - Continue present medications.                           - Await pathology results.                           -  Repeat colonoscopy in 10 years for screening                            purposes. Procedure Code(s):        --- Professional ---                           [phone], Colonoscopy, flexible; with biopsy, single                            or multiple                           99152, Moderate sedation services provided by the                            same physician or other qualified health care                            professional performing the diagnostic or                            therapeutic service that the sedation supports,                            requiring the presence of an independent trained                            observer to assist in the monitoring of the                            patient's level of consciousness and physiological                            status; initial 15 minutes of intraservice time,                            patient age 78 years or older                           [phone], Moderate sedation services; each additional                            15 minutes intraservice time Diagnosis Code(s):        --- Professional ---                            Z12.11, Encounter for screening for malignant                            neoplasm of colon                           K63.3, Ulcer of intestine CPT copyright 2016 American Medical Association. All rights reserved. The codes documented in this report are preliminary and upon coder review may  be revised to meet current compliance requirements. Hildred Laser, MD Hildred Laser, MD 04/12/2017 8:51:52 AM This report has been signed electronically. Number of Addenda: 0

## 2017-04-12 NOTE — Discharge Instructions (Signed)
Keep naproxen use to minimum.  Had ascending colon ulcers that were healing and were biopsied. Resume other medications and diet as before. No driving for 24 hours. Physician will call with biopsy results.    Colonoscopy, Adult, Care After This sheet gives you information about how to care for yourself after your procedure. Your doctor may also give you more specific instructions. If you have problems or questions, call your doctor. Follow these instructions at home: General instructions    For the first 24 hours after the procedure:  Do not drive or use machinery.  Do not sign important documents.  Do not drink alcohol.  Do your daily activities more slowly than normal.  Eat foods that are soft and easy to digest.  Rest often.  Take over-the-counter or prescription medicines only as told by your doctor.  It is up to you to get the results of your procedure. Ask your doctor, or the department performing the procedure, when your results will be ready. To help cramping and bloating:   Try walking around.  Put heat on your belly (abdomen) as told by your doctor. Use a heat source that your doctor recommends, such as a moist heat pack or a heating pad.  Put a towel between your skin and the heat source.  Leave the heat on for 20-30 minutes.  Remove the heat if your skin turns bright red. This is especially important if you cannot feel pain, heat, or cold. You can get burned. Eating and drinking   Drink enough fluid to keep your pee (urine) clear or pale yellow.  Return to your normal diet as told by your doctor. Avoid heavy or fried foods that are hard to digest.  Avoid drinking alcohol for as long as told by your doctor. Contact a doctor if:  You have blood in your poop (stool) 2-3 days after the procedure. Get help right away if:  You have more than a small amount of blood in your poop.  You see large clumps of tissue (blood clots) in your poop.  Your belly is  swollen.  You feel sick to your stomach (nauseous).  You throw up (vomit).  You have a fever.  You have belly pain that gets worse, and medicine does not help your pain. This information is not intended to replace advice given to you by your health care provider. Make sure you discuss any questions you have with your health care provider. Document Released: 12/03/2010 Document Revised: 07/25/2016 Document Reviewed: 07/25/2016 Elsevier Interactive Patient Education  2017 [employer].

## 2017-04-14 ENCOUNTER — Encounter (HOSPITAL_COMMUNITY): Payer: Self-pay | Admitting: Internal Medicine

## 2017-04-17 DIAGNOSIS — I10 Essential (primary) hypertension: Secondary | ICD-10-CM | POA: Diagnosis not present

## 2017-04-19 DIAGNOSIS — Z6831 Body mass index (BMI) 31.0-31.9, adult: Secondary | ICD-10-CM | POA: Diagnosis not present

## 2017-04-19 DIAGNOSIS — C50919 Malignant neoplasm of unspecified site of unspecified female breast: Secondary | ICD-10-CM | POA: Diagnosis not present

## 2017-04-19 DIAGNOSIS — R7301 Impaired fasting glucose: Secondary | ICD-10-CM | POA: Diagnosis not present

## 2017-04-19 DIAGNOSIS — I10 Essential (primary) hypertension: Secondary | ICD-10-CM | POA: Diagnosis not present

## 2017-04-19 DIAGNOSIS — M25559 Pain in unspecified hip: Secondary | ICD-10-CM | POA: Diagnosis not present

## 2017-04-19 DIAGNOSIS — M545 Low back pain: Secondary | ICD-10-CM | POA: Diagnosis not present

## 2017-04-19 DIAGNOSIS — R944 Abnormal results of kidney function studies: Secondary | ICD-10-CM | POA: Diagnosis not present

## 2017-05-01 ENCOUNTER — Other Ambulatory Visit (HOSPITAL_COMMUNITY): Payer: Self-pay | Admitting: Dermatology

## 2017-05-01 ENCOUNTER — Other Ambulatory Visit (HOSPITAL_COMMUNITY): Payer: Self-pay | Admitting: Internal Medicine

## 2017-05-01 DIAGNOSIS — Z780 Asymptomatic menopausal state: Secondary | ICD-10-CM

## 2017-05-08 ENCOUNTER — Ambulatory Visit (HOSPITAL_COMMUNITY)
Admission: RE | Admit: 2017-05-08 | Discharge: 2017-05-08 | Disposition: A | Discharge status: Home / Self Care | Payer: PPO | Source: Ambulatory Visit | Attending: Internal Medicine | Admitting: Internal Medicine

## 2017-05-08 DIAGNOSIS — M8588 Other specified disorders of bone density and structure, other site: Secondary | ICD-10-CM | POA: Diagnosis not present

## 2017-05-08 DIAGNOSIS — Z780 Asymptomatic menopausal state: Secondary | ICD-10-CM

## 2017-05-08 DIAGNOSIS — M85852 Other specified disorders of bone density and structure, left thigh: Secondary | ICD-10-CM | POA: Diagnosis not present

## 2017-06-08 DIAGNOSIS — M1612 Unilateral primary osteoarthritis, left hip: Secondary | ICD-10-CM | POA: Diagnosis not present

## 2017-06-08 DIAGNOSIS — M1611 Unilateral primary osteoarthritis, right hip: Secondary | ICD-10-CM | POA: Diagnosis not present

## 2017-06-14 DIAGNOSIS — L255 Unspecified contact dermatitis due to plants, except food: Secondary | ICD-10-CM | POA: Diagnosis not present

## 2017-06-14 DIAGNOSIS — I872 Venous insufficiency (chronic) (peripheral): Secondary | ICD-10-CM | POA: Diagnosis not present

## 2017-06-14 DIAGNOSIS — Z1283 Encounter for screening for malignant neoplasm of skin: Secondary | ICD-10-CM | POA: Diagnosis not present

## 2017-06-29 ENCOUNTER — Other Ambulatory Visit: Payer: Self-pay | Admitting: Oncology

## 2017-06-29 ENCOUNTER — Other Ambulatory Visit: Payer: Self-pay | Admitting: Internal Medicine

## 2017-06-29 DIAGNOSIS — Z1231 Encounter for screening mammogram for malignant neoplasm of breast: Secondary | ICD-10-CM

## 2017-08-15 ENCOUNTER — Ambulatory Visit
Admission: RE | Admit: 2017-08-15 | Discharge: 2017-08-15 | Disposition: A | Discharge status: Home / Self Care | Payer: PPO | Source: Ambulatory Visit | Attending: Internal Medicine | Admitting: Internal Medicine

## 2017-08-15 ENCOUNTER — Other Ambulatory Visit: Payer: Self-pay | Admitting: Internal Medicine

## 2017-08-15 DIAGNOSIS — Z1231 Encounter for screening mammogram for malignant neoplasm of breast: Secondary | ICD-10-CM

## 2017-08-16 DIAGNOSIS — Z23 Encounter for immunization: Secondary | ICD-10-CM | POA: Diagnosis not present

## 2017-09-26 NOTE — Pre-Procedure Instructions (Signed)
Haley Peterson  09/26/2017      [place], [place] [address] [place]  [address] Phone: [phone] Fax: [phone]    Your procedure is scheduled on October 09, 2017.  Report to [facility] Admitting at 1045 A.M.  Call this number if you have problems the morning of surgery:  [phone]   Remember:  Do not eat food or drink liquids after midnight.  Take these medicines the morning of surgery with A SIP OF WATER (none).   7 days prior to surgery STOP taking any Aspirin (unless otherwise instructed by your surgeon), Aleve, Naproxen, Ibuprofen, Motrin, Advil, Goody's, BC's, all herbal medications, fish oil, and all vitamins  Continue all other medications as instructed by your physician except follow the above medication instructions before surgery   Do not wear jewelry, make-up or nail polish.  Do not wear lotions, powders, or perfumes, or deoderant.  Do not shave 48 hours prior to surgery.   Do not bring valuables to the hospital.  [facility] is not responsible for any belongings or valuables.  Contacts, dentures or bridgework may not be worn into surgery.  Leave your suitcase in the car.  After surgery it may be brought to your room.  For patients admitted to the hospital, discharge time will be determined by your treatment team.  Patients discharged the day of surgery will not be allowed to drive home.   Special instructions:  [facility]- Preparing For Surgery  Before surgery, you can play an important role. Because skin is not sterile, your skin needs to be as free of germs as possible. You can reduce the number of germs on your skin by washing with CHG (chlorahexidine gluconate) Soap before surgery.  CHG is an antiseptic cleaner which kills germs and bonds with the skin to continue killing germs even after washing.  Please do not use if you have an allergy to CHG or antibacterial soaps. If your  skin becomes reddened/irritated stop using the CHG.  Do not shave (including legs and underarms) for at least 48 hours prior to first CHG shower. It is OK to shave your face.  Please follow these instructions carefully.   1. Shower the NIGHT BEFORE SURGERY and the MORNING OF SURGERY with CHG.   2. If you chose to wash your hair, wash your hair first as usual with your normal shampoo.  3. After you shampoo, rinse your hair and body thoroughly to remove the shampoo.  4. Use CHG as you would any other liquid soap. You can apply CHG directly to the skin and wash gently with a scrungie or a clean washcloth.   5. Apply the CHG Soap to your body ONLY FROM THE NECK DOWN.  Do not use on open wounds or open sores. Avoid contact with your eyes, ears, mouth and genitals (private parts). Wash Face and genitals (private parts)  with your normal soap.  6. Wash thoroughly, paying special attention to the area where your surgery will be performed.  7. Thoroughly rinse your body with warm water from the neck down.  8. DO NOT shower/wash with your normal soap after using and rinsing off the CHG Soap.  9. Pat yourself dry with a CLEAN TOWEL.  10. Wear CLEAN PAJAMAS to bed the night before surgery, wear comfortable clothes the morning of surgery  11. Place CLEAN SHEETS on your bed the night of your first shower and DO NOT SLEEP  WITH PETS.    Day of Surgery: Do not apply any deodorants/lotions. Please wear clean clothes to the hospital/surgery center.     Please read over the following fact sheets that you were given. Pain Booklet, Coughing and Deep Breathing, MRSA Information and Surgical Site Infection Prevention

## 2017-09-27 ENCOUNTER — Encounter (HOSPITAL_COMMUNITY)
Admission: RE | Admit: 2017-09-27 | Discharge: 2017-09-27 | Disposition: A | Discharge status: Home / Self Care | Payer: PPO | Source: Ambulatory Visit | Attending: Orthopedic Surgery | Admitting: Orthopedic Surgery

## 2017-09-27 ENCOUNTER — Encounter (HOSPITAL_COMMUNITY): Payer: Self-pay

## 2017-09-27 ENCOUNTER — Other Ambulatory Visit: Payer: Self-pay

## 2017-09-27 ENCOUNTER — Ambulatory Visit (HOSPITAL_COMMUNITY)
Admission: RE | Admit: 2017-09-27 | Discharge: 2017-09-27 | Disposition: A | Discharge status: Home / Self Care | Payer: PPO | Source: Ambulatory Visit | Attending: Orthopedic Surgery | Admitting: Orthopedic Surgery

## 2017-09-27 DIAGNOSIS — M1611 Unilateral primary osteoarthritis, right hip: Secondary | ICD-10-CM | POA: Insufficient documentation

## 2017-09-27 DIAGNOSIS — R9431 Abnormal electrocardiogram [ECG] [EKG]: Secondary | ICD-10-CM | POA: Insufficient documentation

## 2017-09-27 DIAGNOSIS — Z87891 Personal history of nicotine dependence: Secondary | ICD-10-CM | POA: Diagnosis not present

## 2017-09-27 DIAGNOSIS — Z01818 Encounter for other preprocedural examination: Secondary | ICD-10-CM | POA: Insufficient documentation

## 2017-09-27 DIAGNOSIS — Z0183 Encounter for blood typing: Secondary | ICD-10-CM | POA: Diagnosis not present

## 2017-09-27 DIAGNOSIS — Z79899 Other long term (current) drug therapy: Secondary | ICD-10-CM | POA: Insufficient documentation

## 2017-09-27 DIAGNOSIS — Z01812 Encounter for preprocedural laboratory examination: Secondary | ICD-10-CM | POA: Diagnosis not present

## 2017-09-27 DIAGNOSIS — I10 Essential (primary) hypertension: Secondary | ICD-10-CM | POA: Diagnosis not present

## 2017-09-27 DIAGNOSIS — Z853 Personal history of malignant neoplasm of breast: Secondary | ICD-10-CM | POA: Insufficient documentation

## 2017-09-27 DIAGNOSIS — R918 Other nonspecific abnormal finding of lung field: Secondary | ICD-10-CM | POA: Diagnosis not present

## 2017-09-27 HISTORY — DX: Dyspnea, unspecified: R06.00

## 2017-09-27 LAB — BASIC METABOLIC PANEL
Anion gap: 9 (ref 5–15)
BUN: 17 mg/dL (ref 6–20)
CALCIUM: 9.3 mg/dL (ref 8.9–10.3)
CHLORIDE: 106 mmol/L (ref 101–111)
CO2: 23 mmol/L (ref 22–32)
CREATININE: 1.32 mg/dL — AB (ref 0.44–1.00)
GFR calc Af Amer: 46 mL/min — ABNORMAL LOW (ref >60)
GFR calc non Af Amer: 40 mL/min — ABNORMAL LOW (ref >60)
GLUCOSE: 108 mg/dL — AB (ref 65–99)
Potassium: 3.7 mmol/L (ref 3.5–5.1)
Sodium: 138 mmol/L (ref 135–145)

## 2017-09-27 LAB — CBC WITH DIFFERENTIAL/PLATELET
BAND NEUTROPHILS: 0 %
BASOS PCT: 0 %
Basophils Absolute: 0 10*3/uL (ref 0.0–0.1)
Blasts: 0 %
EOS ABS: 0 10*3/uL (ref 0.0–0.7)
EOS PCT: 0 %
HCT: 39.4 % (ref 36.0–46.0)
HEMOGLOBIN: 13 g/dL (ref 12.0–15.0)
LYMPHS ABS: 1.4 10*3/uL (ref 0.7–4.0)
Lymphocytes Relative: 14 %
MCH: 29 pg (ref 26.0–34.0)
MCHC: 33 g/dL (ref 30.0–36.0)
MCV: 87.9 fL (ref 78.0–100.0)
MYELOCYTES: 0 %
Metamyelocytes Relative: 0 %
Monocytes Absolute: 1.2 10*3/uL — ABNORMAL HIGH (ref 0.1–1.0)
Monocytes Relative: 12 %
Neutro Abs: 7.3 10*3/uL (ref 1.7–7.7)
Neutrophils Relative %: 74 %
Other: 0 %
PROMYELOCYTES ABS: 0 %
Platelets: 395 10*3/uL (ref 150–400)
RBC: 4.48 MIL/uL (ref 3.87–5.11)
RDW: 13.9 % (ref 11.5–15.5)
WBC: 9.9 10*3/uL (ref 4.0–10.5)
nRBC: 0 /100 WBC

## 2017-09-27 LAB — URINALYSIS, ROUTINE W REFLEX MICROSCOPIC
Bilirubin Urine: NEGATIVE
Glucose, UA: NEGATIVE mg/dL
Ketones, ur: NEGATIVE mg/dL
Leukocytes, UA: NEGATIVE
Nitrite: NEGATIVE
Protein, ur: NEGATIVE mg/dL
Specific Gravity, Urine: 1.019 (ref 1.005–1.030)
pH: 5 (ref 5.0–8.0)

## 2017-09-27 LAB — APTT: aPTT: 27 seconds (ref 24–36)

## 2017-09-27 LAB — TYPE AND SCREEN
ABO/RH(D): A POS
ANTIBODY SCREEN: NEGATIVE

## 2017-09-27 LAB — PROTIME-INR
INR: 1.07
Prothrombin Time: 13.8 seconds (ref 11.4–15.2)

## 2017-09-27 LAB — SURGICAL PCR SCREEN
MRSA, PCR: NEGATIVE
STAPHYLOCOCCUS AUREUS: NEGATIVE

## 2017-09-27 NOTE — Pre-Procedure Instructions (Addendum)
Missi TABYTHA GRADILLAS  09/27/2017      [place], [place] [address] [place] [address] Phone: [phone] Fax: [phone]    Your procedure is scheduled on October 09, 2017.  Report to [facility] Admitting at 1045 A.M.  Call this number if you have problems the morning of surgery:  [phone]   Remember:  Do not eat food or drink liquids after midnight.  Take these medicines the morning of surgery with A SIP OF WATER    (none).   7 days prior to surgery 10/02/17 STOP taking any Aspirin (unless otherwise instructed by your surgeon), Aleve, Naproxen, Ibuprofen, Motrin, Advil, Goody's, BC's, all herbal medications, fish oil, and all vitamins,garlic,tumeric  Continue all other medications as instructed by your physician except follow the above medication instructions before surgery   Do not wear jewelry, make-up or nail polish.  Do not wear lotions, powders, or perfumes, or deoderant.  Do not shave 48 hours prior to surgery.   Do not bring valuables to the hospital.  [facility] is not responsible for any belongings or valuables.  Contacts, dentures or bridgework may not be worn into surgery.  Leave your suitcase in the car.  After surgery it may be brought to your room.  For patients admitted to the hospital, discharge time will be determined by your treatment team.  Patients discharged the day of surgery will not be allowed to drive home.   Special instructions:  [facility]- Preparing For Surgery  Before surgery, you can play an important role. Because skin is not sterile, your skin needs to be as free of germs as possible. You can reduce the number of germs on your skin by washing with CHG (chlorahexidine gluconate) Soap before surgery.  CHG is an antiseptic cleaner which kills germs and bonds with the skin to continue killing germs even after washing.  Please do not use if you have an allergy to CHG or  antibacterial soaps. If your skin becomes reddened/irritated stop using the CHG.  Do not shave (including legs and underarms) for at least 48 hours prior to first CHG shower. It is OK to shave your face.  Please follow these instructions carefully.   1. Shower the NIGHT BEFORE SURGERY and the MORNING OF SURGERY with CHG.   2. If you chose to wash your hair, wash your hair first as usual with your normal shampoo.  3. After you shampoo, rinse your hair and body thoroughly to remove the shampoo.  4. Use CHG as you would any other liquid soap. You can apply CHG directly to the skin and wash gently with a scrungie or a clean washcloth.   5. Apply the CHG Soap to your body ONLY FROM THE NECK DOWN.  Do not use on open wounds or open sores. Avoid contact with your eyes, ears, mouth and genitals (private parts). Wash Face and genitals (private parts)  with your normal soap.  6. Wash thoroughly, paying special attention to the area where your surgery will be performed.  7. Thoroughly rinse your body with warm water from the neck down.  8. DO NOT shower/wash with your normal soap after using and rinsing off the CHG Soap.  9. Pat yourself dry with a CLEAN TOWEL.  10. Wear CLEAN PAJAMAS to bed the night before surgery, wear comfortable clothes the morning of surgery  11. Place CLEAN SHEETS on your bed the night of your first shower  and DO NOT SLEEP WITH PETS.    Day of Surgery: Do not apply any deodorants/lotions. Please wear clean clothes to the hospital/surgery center.     Please read over the following fact sheets that you were given. Pain Booklet, Coughing and Deep Breathing, MRSA Information and Surgical Site Infection Prevention

## 2017-09-28 NOTE — Progress Notes (Addendum)
Anesthesia Chart Review:  Pt is a 71 year old female scheduled for R total hip arthroplasty anterior approach on 10/09/2017 with Frederik Pear, MD  PMH includes:  HTN, breast cancer.  Former smoker. BMI 33  Pt reported pre-admission RN she moved in last couple of months, and since then has not been able to exercise as she previously would have.  Reports feeling winded when walking to mailbox, but no chest pain.  Pt left before I could see her in pre-admission testing. I spoke with pt by telephone about her SOB symptoms.  Pt feels she's not really SOB, but when she's active she develops back and hip pain that require her to stop and rest for a minute before she can continue.  Denies chest pain. Can lie flat but not for long due to back pain  Medications include: Losartan-HCTZ  BP (!) 135/59   Pulse 99   Temp 36.6 C   Resp 20   Ht 5\' 4"  (1.626 m)   Wt 192 lb 14.4 oz (87.5 kg)   SpO2 95%   BMI 33.11 kg/m   Preoperative labs reviewed.    CXR 09/27/17:  - Chronic bronchitic-smoking related changes. There is no acute cardiopulmonary abnormality.  EKG 09/27/17: NSR  If no changes, I anticipate pt can proceed with surgery as scheduled.   Willeen Cass, FNP-BC [facility]/Anesthesiology Phone: [phone] 09/29/2017 3:16 PM

## 2017-10-04 DIAGNOSIS — M1611 Unilateral primary osteoarthritis, right hip: Secondary | ICD-10-CM | POA: Diagnosis present

## 2017-10-04 NOTE — H&P (Signed)
TOTAL HIP ADMISSION H&P  Patient is admitted for right total hip arthroplasty.  Subjective:  Chief Complaint: right hip pain  HPI: Haley Peterson, 71 y.o. female, has a history of pain and functional disability in the right hip(s) due to arthritis and patient has failed non-surgical conservative treatments for greater than 12 weeks to include NSAID's and/or analgesics, flexibility and strengthening excercises, weight reduction as appropriate and activity modification.  Onset of symptoms was gradual starting 1 years ago with gradually worsening course since that time.The patient noted no past surgery on the right hip(s).  Patient currently rates pain in the right hip at 10 out of 10 with activity. Patient has night pain, worsening of pain with activity and weight bearing, pain that interfers with activities of daily living and pain with passive range of motion. Patient has evidence of joint space narrowing by imaging studies. This condition presents safety issues increasing the risk of falls.    There is no current active infection.  Patient Active Problem List   Diagnosis Date Noted  . Special screening for malignant neoplasms, colon 12/30/2016  . Breast cancer ([place]) 06/27/2011  . Hx Breast cancer, Right, multifocal IDC, receptor +, Her2 - 06/07/2006    Class: Stage 1  . Hx Breast cancer ([facility]), Right, Stage 0,  08/08/2001   Past Medical History:  Diagnosis Date  . Arthritis   . Bone spur    left heel  . Cancer ([place])   . Dyspnea    recent last 2 months when walk to mail box- "winded" less active -no exercise 2 months   . History of breast cancer    left, right  . Hypertension   . Pneumonia 10/2012    Past Surgical History:  Procedure Laterality Date  . ABDOMINAL HYSTERECTOMY    . BIOPSY  04/12/2017   Procedure: BIOPSY;  Surgeon: Rogene Houston, MD;  Location: [facility];  Service: Endoscopy;;  ascending colon ulcer biopsies  . BREAST LUMPECTOMY  08/28/2001   left  . BREAST  RECONSTRUCTION Right    implant  . CHOLECYSTECTOMY  2002  . COLONOSCOPY    . COLONOSCOPY N/A 04/12/2017   Procedure: COLONOSCOPY;  Surgeon: Rogene Houston, MD;  Location: [facility];  Service: Endoscopy;  Laterality: N/A;  830  . MASTECTOMY  08/15/2006   right  . PARTIAL HYSTERECTOMY  1985  . REPLACEMENT TOTAL KNEE  2009, 2010   right-2010, left 2009  . TISSUE EXPANDER PLACEMENT      No current facility-administered medications for this encounter.    Current Outpatient Medications  Medication Sig Dispense Refill Last Dose  . betamethasone dipropionate (DIPROLENE) 0.05 % cream Apply 1 application 2 (two) times daily as needed topically (skin irritation).      . Calcium-Vitamin D (CALTRATE 600 PLUS-VIT D PO) Take 1 tablet by mouth daily with supper.    04/10/2017  . cholecalciferol (VITAMIN D) 1000 UNITS tablet Take 1,000 Units by mouth daily with supper.    04/10/2017  . Garlic (GARLIQUE) 097 MG TBEC Take 1 tablet by mouth daily with supper.    04/10/2017  . ibuprofen (ADVIL,MOTRIN) 200 MG tablet Take 800 mg 2 (two) times daily by mouth.     . losartan-hydrochlorothiazide (HYZAAR) 100-12.5 MG tablet Take 1 tablet by mouth daily with breakfast.   04/12/2017 at 0600  . TURMERIC CURCUMIN PO Take 3 capsules daily with supper by mouth.    04/10/2017   Allergies  Allergen Reactions  . Oxytetracycline  Other (See Comments)    Fever blisters  . Aromasin [Exemestane] Other (See Comments)    Pt is unsure of what reaction was...  . Codeine     LIGHTHEADED  . Penicillins Other (See Comments)    Lost of vision Has patient had a PCN reaction causing immediate rash, facial/tongue/throat swelling, SOB or lightheadedness with hypotension: No Has patient had a PCN reaction causing severe rash involving mucus membranes or skin necrosis: No Has patient had a PCN reaction that required hospitalization: No Has patient had a PCN reaction occurring within the last 10 years: No If all of the above answers  are "NO", then may proceed with Cephalosporin use.   . Tamoxifen Other (See Comments)    "GENERAL ILLNESS WITH LIGHTHEADNESS"     Social History   Tobacco Use  . Smoking status: Former Smoker    Packs/day: 0.50    Years: 5.00    Pack years: 2.50    Types: Cigarettes    Last attempt to quit: 09/28/1983    Years since quitting: 34.0  . Smokeless tobacco: Never Used  Substance Use Topics  . Alcohol use: No    Family History  Problem Relation Age of Onset  . Arthritis Mother   . Thyroid cancer Brother        2010  . Colon cancer Neg Hx      Review of Systems  Constitutional: Negative.   HENT: Positive for tinnitus.   Eyes: Negative.   Respiratory: Negative.   Cardiovascular:       HTN  Gastrointestinal: Negative.   Genitourinary: Negative.   Musculoskeletal: Positive for joint pain and myalgias.  Skin: Negative.   Neurological: Negative.   Endo/Heme/Allergies: Negative.   Psychiatric/Behavioral: The patient has insomnia.     Objective:  Physical Exam  Constitutional: She is oriented to person, place, and time. She appears well-developed and well-nourished.  HENT:  Head: Normocephalic and atraumatic.  Eyes: Pupils are equal, round, and reactive to light.  Neck: Normal range of motion. Neck supple.  Cardiovascular: Intact distal pulses.  Respiratory: Effort normal.  Musculoskeletal:  she has good strength bilaterally in her hips.  She does have reduced range of motion with internal and external rotation and has about 20 of internal rotation and external rotation bilaterally.  Internal rotation over 10 does cause pain.  Calves are soft and nontender.  She is neurovascularly intact distally.  Neurological: She is alert and oriented to person, place, and time.  Skin: Skin is warm and dry.  Psychiatric: She has a normal mood and affect. Her behavior is normal. Judgment and thought content normal.    Vital signs in last 24 hours:    Labs:   Estimated body  mass index is 33.11 kg/m as calculated from the following:   Height as of 09/27/17: _0  (1.626 m).   Weight as of 09/27/17: 87.5 kg (192 lb 14.4 oz).   Imaging Review Plain radiographs demonstrate  moderate to severe arthritis bilateral hips.  She only has a couple of millimeters of cartilage left and bilateral hips.  She does have a periarticular osteophyte formation.  Assessment/Plan:  End stage arthritis, right hip(s)  The patient history, physical examination, clinical judgement of the provider and imaging studies are consistent with end stage degenerative joint disease of the right hip(s) and total hip arthroplasty is deemed medically necessary. The treatment options including medical management, injection therapy, arthroscopy and arthroplasty were discussed at length. The risks and benefits of total hip  arthroplasty were presented and reviewed. The risks due to aseptic loosening, infection, stiffness, dislocation/subluxation,  thromboembolic complications and other imponderables were discussed.  The patient acknowledged the explanation, agreed to proceed with the plan and consent was signed. Patient is being admitted for inpatient treatment for surgery, pain control, PT, OT, prophylactic antibiotics, VTE prophylaxis, progressive ambulation and ADL's and discharge planning.The patient is planning to be discharged home with home health services.

## 2017-10-06 MED ORDER — SODIUM CHLORIDE 0.9 % IV SOLN
1000.0000 mg | INTRAVENOUS | Status: AC
  Administered 2017-10-09: 1000 mg via INTRAVENOUS
  Filled 2017-10-06: qty 10

## 2017-10-06 MED ORDER — TRANEXAMIC ACID 1000 MG/10ML IV SOLN
2000.0000 mg | INTRAVENOUS | Status: AC
  Administered 2017-10-09: 2000 mg via TOPICAL
  Filled 2017-10-06: qty 20

## 2017-10-06 MED ORDER — BUPIVACAINE LIPOSOME 1.3 % IJ SUSP
20.0000 mL | INTRAMUSCULAR | Status: AC
  Administered 2017-10-09: 20 mL
  Filled 2017-10-06: qty 20

## 2017-10-06 MED ORDER — LACTATED RINGERS IV SOLN
INTRAVENOUS | Status: DC
  Administered 2017-10-09 (×2): via INTRAVENOUS

## 2017-10-09 ENCOUNTER — Encounter (HOSPITAL_COMMUNITY): Payer: Self-pay

## 2017-10-09 ENCOUNTER — Other Ambulatory Visit: Payer: Self-pay

## 2017-10-09 ENCOUNTER — Encounter (HOSPITAL_COMMUNITY)
Admission: RE | Disposition: A | Discharge status: Home Health | Payer: Self-pay | Source: Ambulatory Visit | Attending: Orthopedic Surgery

## 2017-10-09 ENCOUNTER — Inpatient Hospital Stay (HOSPITAL_COMMUNITY): Payer: PPO | Admitting: Vascular Surgery

## 2017-10-09 ENCOUNTER — Inpatient Hospital Stay (HOSPITAL_COMMUNITY)
Admission: RE | Admit: 2017-10-09 | Discharge: 2017-10-11 | DRG: 470 | Disposition: A | Discharge status: Home Health | Payer: PPO | Source: Ambulatory Visit | Attending: Orthopedic Surgery | Admitting: Orthopedic Surgery

## 2017-10-09 ENCOUNTER — Inpatient Hospital Stay (HOSPITAL_COMMUNITY): Payer: PPO

## 2017-10-09 ENCOUNTER — Inpatient Hospital Stay (HOSPITAL_COMMUNITY): Payer: PPO | Admitting: Anesthesiology

## 2017-10-09 DIAGNOSIS — D62 Acute posthemorrhagic anemia: Secondary | ICD-10-CM | POA: Diagnosis not present

## 2017-10-09 DIAGNOSIS — I10 Essential (primary) hypertension: Secondary | ICD-10-CM | POA: Diagnosis present

## 2017-10-09 DIAGNOSIS — Z885 Allergy status to narcotic agent status: Secondary | ICD-10-CM | POA: Diagnosis not present

## 2017-10-09 DIAGNOSIS — Z888 Allergy status to other drugs, medicaments and biological substances status: Secondary | ICD-10-CM

## 2017-10-09 DIAGNOSIS — M1711 Unilateral primary osteoarthritis, right knee: Secondary | ICD-10-CM | POA: Diagnosis not present

## 2017-10-09 DIAGNOSIS — Z471 Aftercare following joint replacement surgery: Secondary | ICD-10-CM | POA: Diagnosis not present

## 2017-10-09 DIAGNOSIS — Z853 Personal history of malignant neoplasm of breast: Secondary | ICD-10-CM | POA: Diagnosis not present

## 2017-10-09 DIAGNOSIS — Z96651 Presence of right artificial knee joint: Secondary | ICD-10-CM | POA: Diagnosis present

## 2017-10-09 DIAGNOSIS — Z87891 Personal history of nicotine dependence: Secondary | ICD-10-CM | POA: Diagnosis not present

## 2017-10-09 DIAGNOSIS — Z96641 Presence of right artificial hip joint: Secondary | ICD-10-CM | POA: Diagnosis not present

## 2017-10-09 DIAGNOSIS — Z880 Allergy status to penicillin: Secondary | ICD-10-CM

## 2017-10-09 DIAGNOSIS — Z9181 History of falling: Secondary | ICD-10-CM

## 2017-10-09 DIAGNOSIS — M1611 Unilateral primary osteoarthritis, right hip: Principal | ICD-10-CM | POA: Diagnosis present

## 2017-10-09 DIAGNOSIS — Z419 Encounter for procedure for purposes other than remedying health state, unspecified: Secondary | ICD-10-CM

## 2017-10-09 HISTORY — PX: TOTAL HIP ARTHROPLASTY: SHX124

## 2017-10-09 SURGERY — ARTHROPLASTY, HIP, TOTAL, ANTERIOR APPROACH
Anesthesia: Spinal | Site: Hip | Laterality: Right

## 2017-10-09 MED ORDER — DIPHENHYDRAMINE HCL 50 MG/ML IJ SOLN
INTRAMUSCULAR | Status: DC | PRN
  Administered 2017-10-09: 12.5 mg via INTRAVENOUS

## 2017-10-09 MED ORDER — MENTHOL 3 MG MT LOZG: 1.0000 | LOZENGE | OROMUCOSAL | Status: DC | PRN

## 2017-10-09 MED ORDER — BUPIVACAINE-EPINEPHRINE (PF) 0.5% -1:200000 IJ SOLN
INTRAMUSCULAR | Status: DC | PRN
  Administered 2017-10-09: 20 mL via PERINEURAL
  Administered 2017-10-09: 30 mL via PERINEURAL

## 2017-10-09 MED ORDER — DEXTROSE 5 % IV SOLN
500.0000 mg | Freq: Four times a day (QID) | INTRAVENOUS | Status: DC | PRN
  Filled 2017-10-09: qty 5

## 2017-10-09 MED ORDER — DEXAMETHASONE SODIUM PHOSPHATE 10 MG/ML IJ SOLN
INTRAMUSCULAR | Status: DC | PRN
  Administered 2017-10-09: 5 mg via INTRAVENOUS

## 2017-10-09 MED ORDER — SENNOSIDES-DOCUSATE SODIUM 8.6-50 MG PO TABS: 1.0000 | ORAL_TABLET | Freq: Every evening | ORAL | Status: DC | PRN

## 2017-10-09 MED ORDER — FENTANYL CITRATE (PF) 100 MCG/2ML IJ SOLN: 25.0000 ug | INTRAMUSCULAR | Status: DC | PRN

## 2017-10-09 MED ORDER — ACETAMINOPHEN 650 MG RE SUPP: 650.0000 mg | RECTAL | Status: DC | PRN

## 2017-10-09 MED ORDER — METHOCARBAMOL 500 MG PO TABS
500.0000 mg | ORAL_TABLET | Freq: Four times a day (QID) | ORAL | Status: DC | PRN
  Administered 2017-10-10: 500 mg via ORAL
  Filled 2017-10-09: qty 1

## 2017-10-09 MED ORDER — BUPIVACAINE IN DEXTROSE 0.75-8.25 % IT SOLN
INTRATHECAL | Status: DC | PRN
  Administered 2017-10-09: 1.6 mL via INTRATHECAL

## 2017-10-09 MED ORDER — FLEET ENEMA 7-19 GM/118ML RE ENEM: 1.0000 | ENEMA | Freq: Once | RECTAL | Status: DC | PRN

## 2017-10-09 MED ORDER — BISACODYL 5 MG PO TBEC: 5.0000 mg | DELAYED_RELEASE_TABLET | Freq: Every day | ORAL | Status: DC | PRN

## 2017-10-09 MED ORDER — VANCOMYCIN HCL IN DEXTROSE 1-5 GM/200ML-% IV SOLN
1000.0000 mg | Freq: Once | INTRAVENOUS | Status: AC
  Administered 2017-10-09: 1000 mg via INTRAVENOUS

## 2017-10-09 MED ORDER — DOCUSATE SODIUM 100 MG PO CAPS
100.0000 mg | ORAL_CAPSULE | Freq: Two times a day (BID) | ORAL | Status: DC
  Administered 2017-10-09 – 2017-10-11 (×4): 100 mg via ORAL
  Filled 2017-10-09 (×4): qty 1

## 2017-10-09 MED ORDER — LOSARTAN POTASSIUM 50 MG PO TABS
100.0000 mg | ORAL_TABLET | Freq: Every day | ORAL | Status: DC
  Administered 2017-10-09 – 2017-10-10 (×2): 100 mg via ORAL
  Filled 2017-10-09 (×2): qty 2

## 2017-10-09 MED ORDER — BETAMETHASONE DIPROPIONATE 0.05 % EX CREA: 1.0000 "application " | TOPICAL_CREAM | Freq: Two times a day (BID) | CUTANEOUS | Status: DC | PRN

## 2017-10-09 MED ORDER — HYDROCHLOROTHIAZIDE 12.5 MG PO CAPS
12.5000 mg | ORAL_CAPSULE | Freq: Every day | ORAL | Status: DC
Start: 2017-10-09 — End: 2017-10-11
  Administered 2017-10-09 – 2017-10-10 (×2): 12.5 mg via ORAL
  Filled 2017-10-09 (×2): qty 1

## 2017-10-09 MED ORDER — ALUM & MAG HYDROXIDE-SIMETH 200-200-20 MG/5ML PO SUSP: 30.0000 mL | ORAL | Status: DC | PRN

## 2017-10-09 MED ORDER — GABAPENTIN 300 MG PO CAPS
300.0000 mg | ORAL_CAPSULE | Freq: Three times a day (TID) | ORAL | Status: DC
  Administered 2017-10-09 – 2017-10-11 (×7): 300 mg via ORAL
  Filled 2017-10-09 (×7): qty 1

## 2017-10-09 MED ORDER — LOSARTAN POTASSIUM-HCTZ 100-12.5 MG PO TABS: 1.0000 | ORAL_TABLET | Freq: Every day | ORAL | Status: DC

## 2017-10-09 MED ORDER — METOCLOPRAMIDE HCL 5 MG PO TABS: 5.0000 mg | ORAL_TABLET | Freq: Three times a day (TID) | ORAL | Status: DC | PRN

## 2017-10-09 MED ORDER — ASPIRIN EC 325 MG PO TBEC
325.0000 mg | DELAYED_RELEASE_TABLET | Freq: Every day | ORAL | Status: DC
  Administered 2017-10-10 – 2017-10-11 (×2): 325 mg via ORAL
  Filled 2017-10-09 (×2): qty 1

## 2017-10-09 MED ORDER — ONDANSETRON HCL 4 MG/2ML IJ SOLN: 4.0000 mg | Freq: Once | INTRAMUSCULAR | Status: DC | PRN

## 2017-10-09 MED ORDER — TIZANIDINE HCL 2 MG PO TABS: 2.0000 mg | ORAL_TABLET | Freq: Four times a day (QID) | ORAL | 0 refills | Status: DC | PRN

## 2017-10-09 MED ORDER — OXYCODONE HCL 5 MG PO TABS
10.0000 mg | ORAL_TABLET | ORAL | Status: DC | PRN
  Administered 2017-10-10: 10 mg via ORAL
  Filled 2017-10-09: qty 2

## 2017-10-09 MED ORDER — VANCOMYCIN HCL 1000 MG IV SOLR
INTRAVENOUS | Status: DC | PRN
  Administered 2017-10-09: 1000 mg via INTRAVENOUS

## 2017-10-09 MED ORDER — DIPHENHYDRAMINE HCL 12.5 MG/5ML PO ELIX: 12.5000 mg | ORAL_SOLUTION | ORAL | Status: DC | PRN

## 2017-10-09 MED ORDER — CHLORHEXIDINE GLUCONATE 4 % EX LIQD: 60.0000 mL | Freq: Once | CUTANEOUS | Status: DC

## 2017-10-09 MED ORDER — ONDANSETRON HCL 4 MG/2ML IJ SOLN: 4.0000 mg | Freq: Four times a day (QID) | INTRAMUSCULAR | Status: DC | PRN

## 2017-10-09 MED ORDER — OXYCODONE-ACETAMINOPHEN 5-325 MG PO TABS: 1.0000 | ORAL_TABLET | ORAL | 0 refills | Status: DC | PRN

## 2017-10-09 MED ORDER — FENTANYL CITRATE (PF) 250 MCG/5ML IJ SOLN
INTRAMUSCULAR | Status: AC
  Filled 2017-10-09: qty 5

## 2017-10-09 MED ORDER — KCL IN DEXTROSE-NACL 20-5-0.45 MEQ/L-%-% IV SOLN
INTRAVENOUS | Status: DC
  Administered 2017-10-09 – 2017-10-10 (×2): via INTRAVENOUS
  Filled 2017-10-09 (×2): qty 1000

## 2017-10-09 MED ORDER — OXYCODONE HCL 5 MG PO TABS
5.0000 mg | ORAL_TABLET | ORAL | Status: DC | PRN
  Administered 2017-10-09: 5 mg via ORAL
  Filled 2017-10-09: qty 1

## 2017-10-09 MED ORDER — ACETAMINOPHEN 325 MG PO TABS
650.0000 mg | ORAL_TABLET | ORAL | Status: DC | PRN
  Administered 2017-10-10: 650 mg via ORAL
  Filled 2017-10-09: qty 2

## 2017-10-09 MED ORDER — PROPOFOL 10 MG/ML IV BOLUS
INTRAVENOUS | Status: AC
Start: 2017-10-09 — End: 2017-10-09
  Filled 2017-10-09: qty 20

## 2017-10-09 MED ORDER — PHENOL 1.4 % MT LIQD: 1.0000 | OROMUCOSAL | Status: DC | PRN

## 2017-10-09 MED ORDER — CELECOXIB 200 MG PO CAPS
200.0000 mg | ORAL_CAPSULE | Freq: Two times a day (BID) | ORAL | Status: DC
  Administered 2017-10-09 – 2017-10-11 (×4): 200 mg via ORAL
  Filled 2017-10-09 (×4): qty 1

## 2017-10-09 MED ORDER — ONDANSETRON HCL 4 MG PO TABS: 4.0000 mg | ORAL_TABLET | Freq: Four times a day (QID) | ORAL | Status: DC | PRN

## 2017-10-09 MED ORDER — ASPIRIN EC 325 MG PO TBEC: 325.0000 mg | DELAYED_RELEASE_TABLET | Freq: Two times a day (BID) | ORAL | 0 refills | Status: DC

## 2017-10-09 MED ORDER — TRANEXAMIC ACID 1000 MG/10ML IV SOLN
1000.0000 mg | Freq: Once | INTRAVENOUS | Status: DC
  Filled 2017-10-09: qty 10

## 2017-10-09 MED ORDER — PHENYLEPHRINE HCL 10 MG/ML IJ SOLN
INTRAMUSCULAR | Status: DC | PRN
  Administered 2017-10-09: 40 ug via INTRAVENOUS
  Administered 2017-10-09: 80 ug via INTRAVENOUS

## 2017-10-09 MED ORDER — HYDROMORPHONE HCL 1 MG/ML IJ SOLN: 0.5000 mg | INTRAMUSCULAR | Status: DC | PRN

## 2017-10-09 MED ORDER — FENTANYL CITRATE (PF) 250 MCG/5ML IJ SOLN
INTRAMUSCULAR | Status: DC | PRN
  Administered 2017-10-09 (×2): 25 ug via INTRAVENOUS
  Administered 2017-10-09: 50 ug via INTRAVENOUS

## 2017-10-09 MED ORDER — DEXAMETHASONE SODIUM PHOSPHATE 10 MG/ML IJ SOLN
10.0000 mg | Freq: Once | INTRAMUSCULAR | Status: AC
  Administered 2017-10-10: 10 mg via INTRAVENOUS
  Filled 2017-10-09: qty 1

## 2017-10-09 MED ORDER — PROPOFOL 500 MG/50ML IV EMUL
INTRAVENOUS | Status: DC | PRN
  Administered 2017-10-09: 75 ug/kg/min via INTRAVENOUS

## 2017-10-09 MED ORDER — 0.9 % SODIUM CHLORIDE (POUR BTL) OPTIME
TOPICAL | Status: DC | PRN
  Administered 2017-10-09: 1000 mL

## 2017-10-09 MED ORDER — PROPOFOL 10 MG/ML IV BOLUS
INTRAVENOUS | Status: DC | PRN
  Administered 2017-10-09 (×3): 10 mg via INTRAVENOUS
  Administered 2017-10-09: 20 mg via INTRAVENOUS
  Administered 2017-10-09: 10 mg via INTRAVENOUS

## 2017-10-09 MED ORDER — VANCOMYCIN HCL IN DEXTROSE 1-5 GM/200ML-% IV SOLN
INTRAVENOUS | Status: AC
  Filled 2017-10-09: qty 200

## 2017-10-09 MED ORDER — METOCLOPRAMIDE HCL 5 MG/ML IJ SOLN: 5.0000 mg | Freq: Three times a day (TID) | INTRAMUSCULAR | Status: DC | PRN

## 2017-10-09 MED ORDER — BUPIVACAINE-EPINEPHRINE (PF) 0.5% -1:200000 IJ SOLN
INTRAMUSCULAR | Status: AC
  Filled 2017-10-09: qty 60

## 2017-10-09 MED ORDER — LIDOCAINE HCL (CARDIAC) 20 MG/ML IV SOLN
INTRAVENOUS | Status: DC | PRN
  Administered 2017-10-09: 50 mg via INTRATRACHEAL

## 2017-10-09 SURGICAL SUPPLY — 46 items
BAG DECANTER FOR FLEXI CONT (MISCELLANEOUS) ×3 IMPLANT
BLADE SAW SGTL 18X1.27X75 (BLADE) ×2 IMPLANT
BLADE SAW SGTL 18X1.27X75MM (BLADE) ×1
CAPT HIP TOTAL 2 ×3 IMPLANT
COVER PERINEAL POST (MISCELLANEOUS) ×3 IMPLANT
COVER SURGICAL LIGHT HANDLE (MISCELLANEOUS) ×3 IMPLANT
DRAPE C-ARM 42X72 X-RAY (DRAPES) ×3 IMPLANT
DRAPE STERI IOBAN 125X83 (DRAPES) ×3 IMPLANT
DRAPE U-SHAPE 47X51 STRL (DRAPES) ×6 IMPLANT
DRSG AQUACEL AG ADV 3.5X10 (GAUZE/BANDAGES/DRESSINGS) ×3 IMPLANT
DURAPREP 26ML APPLICATOR (WOUND CARE) ×3 IMPLANT
ELECT BLADE 4.0 EZ CLEAN MEGAD (MISCELLANEOUS) ×3
ELECT REM PT RETURN 9FT ADLT (ELECTROSURGICAL) ×3
ELECTRODE BLDE 4.0 EZ CLN MEGD (MISCELLANEOUS) ×1 IMPLANT
ELECTRODE REM PT RTRN 9FT ADLT (ELECTROSURGICAL) ×1 IMPLANT
FACESHIELD WRAPAROUND (MASK) ×6 IMPLANT
GLOVE BIO SURGEON STRL SZ7.5 (GLOVE) ×9 IMPLANT
GLOVE BIO SURGEON STRL SZ8.5 (GLOVE) ×3 IMPLANT
GLOVE BIOGEL PI IND STRL 8 (GLOVE) ×3 IMPLANT
GLOVE BIOGEL PI IND STRL 9 (GLOVE) ×1 IMPLANT
GLOVE BIOGEL PI INDICATOR 8 (GLOVE) ×6
GLOVE BIOGEL PI INDICATOR 9 (GLOVE) ×2
GOWN STRL REUS W/ TWL LRG LVL3 (GOWN DISPOSABLE) ×2 IMPLANT
GOWN STRL REUS W/ TWL XL LVL3 (GOWN DISPOSABLE) ×2 IMPLANT
GOWN STRL REUS W/TWL LRG LVL3 (GOWN DISPOSABLE) ×4
GOWN STRL REUS W/TWL XL LVL3 (GOWN DISPOSABLE) ×4
HEAD FEMORAL 32 CERAMIC (Hips) IMPLANT
KIT BASIN OR (CUSTOM PROCEDURE TRAY) ×3 IMPLANT
KIT ROOM TURNOVER OR (KITS) ×3 IMPLANT
MANIFOLD NEPTUNE II (INSTRUMENTS) ×3 IMPLANT
NEEDLE HYPO 22GX1.5 SAFETY (NEEDLE) ×6 IMPLANT
NS IRRIG 1000ML POUR BTL (IV SOLUTION) ×3 IMPLANT
PACK TOTAL JOINT (CUSTOM PROCEDURE TRAY) ×3 IMPLANT
PAD ARMBOARD 7.5X6 YLW CONV (MISCELLANEOUS) ×6 IMPLANT
STEM TRI LOC GRIPTION SZ 4 STD (Hips) IMPLANT
SUT VIC AB 1 CTX 36 (SUTURE) ×2
SUT VIC AB 1 CTX36XBRD ANBCTR (SUTURE) ×1 IMPLANT
SUT VIC AB 2-0 CT1 27 (SUTURE) ×2
SUT VIC AB 2-0 CT1 TAPERPNT 27 (SUTURE) ×1 IMPLANT
SUT VIC AB 3-0 CT1 27 (SUTURE) ×2
SUT VIC AB 3-0 CT1 TAPERPNT 27 (SUTURE) ×1 IMPLANT
SYR CONTROL 10ML LL (SYRINGE) ×6 IMPLANT
TOWEL OR 17X24 6PK STRL BLUE (TOWEL DISPOSABLE) ×3 IMPLANT
TOWEL OR 17X26 10 PK STRL BLUE (TOWEL DISPOSABLE) ×3 IMPLANT
TRAY CATH 16FR W/PLASTIC CATH (SET/KITS/TRAYS/PACK) ×3 IMPLANT
TRI LOC GRIPTION SZ 4 STD (Hips) IMPLANT

## 2017-10-09 NOTE — Op Note (Addendum)
OPERATIVE REPORT    DATE OF PROCEDURE:  10/09/2017       PREOPERATIVE DIAGNOSIS:  RIGHT HIP OSTEOARTHRITIS                                                          POSTOPERATIVE DIAGNOSIS:  RIGHT HIP OSTEOARTHRITIS                                                           PROCEDURE: Anterior R total hip arthroplasty using a 50 mm DePuy Pinnacle  Cup, [employer], 0-degree polyethylene liner, a +1.5 32 mm ceramic head, a 4 hi Depuy Triloc stem   SURGEON: Kerin Salen    ASSISTANT:   Kerry Hough. [employer]  (present throughout entire procedure and necessary for timely completion of the procedure)   ANESTHESIA: Spinal BLOOD LOSS: 300 FLUID REPLACEMENT: 1600cc crystalloid Antibiotic: 1gm vancomycin Tranexamic Acid: 1gm IV, 2gm Topical COMPLICATIONS: none    INDICATIONS FOR PROCEDURE: A 71 y.o. year-old With  RIGHT KNEE OSTEOARTHRITIS   for 3 years, x-rays show bone-on-bone arthritic changes, and osteophytes. Despite conservative measures with observation, anti-inflammatory medicine, narcotics, use of a cane, has severe unremitting pain and can ambulate only a few blocks before resting. Patient desires elective r total hip arthroplasty to decrease pain and increase function. The risks, benefits, and alternatives were discussed at length including but not limited to the risks of infection, bleeding, nerve injury, stiffness, blood clots, the need for revision surgery, cardiopulmonary complications, among others, and they were willing to proceed. Questions answered     PROCEDURE IN DETAIL: The patient was identified by armband,  received preoperative IV antibiotics in the holding area at [facility], taken to the operating room , appropriate anesthetic monitors  were attached and  anesthesia was induced with the patienton the gurney. The HANA boots were applied to the feet and he was then transferred to the HANA table with a peroneal post and support underneath the  non-operative le, which was locked in 5 lb traction. Theoperative lower extremity was then prepped and draped in the usual sterile fashion from just above the iliac crest to the knee. And a timeout procedure was performed. We then made a 15 cm incision along the interval at the leading edge of the tensor fascia lata of starting at 2 cm lateral to and 2 cm distal to the ASIS. Small bleeders in the skin and subcutaneous tissue identified and cauterized we dissected down to the fascia and made an incision in the fascia allowing [place] to elevate the fascia of the tensor muscle and exploited the interval between the rectus and the tensor fascia lata. A Hohmann retractor was then placed along the superior neck of the femur and a Cobra retractor along the inferior neck of the femur we teed the capsule starting out at the superior anterior aspect of the acetabulum going distally and made the T along the neck both leaflets of the T were tagged with #2 Ethibond suture. Cobra retractors were then placed along the inferior and superior neck allowing [place] to perform a standard neck cut  and removed the femoral head with a power corkscrew. We then placed a right angle Hohmann retractor along the anterior aspect of the acetabulum a spiked Cobra in the cotyloid notch and posteriorly a Muelller retractor. We then sequentially reamed up to a 49 mm basket reamer obtaining good coverage in all quadrants, verified by C-arm imaging. Under C-arm control with and hammered into place a 50 mm Pinnacle cup in 45 of abduction and 15 of anteversion. The cup seated nicely and required no supplemental screws. We then placed a central hole Eliminator and a 0 polyethylene liner. The foot was then externally rotated to 110, the HANA elevator was placed around the flare of the greater trochanter and the limb was extended and abducted delivering the proximal femur up into the wound. A medium Hohmann retractor was placed over the greater trochanter and a  Mueller retractor along the posterior femoral neck completing the exposure. We then performed releases superiorly and and inferiorly of the capsule going back to the pirformis fossa superiorly and to the lesser trochanter inferiorly. We then entered the proximal femur with the box cutting offset chisel followed by, a canal sounder, the chili pepper and broaching up to a 4 broach. This seated nicely and we reamed the calcar. A trial reduction was performed with a 1.5 mm 32 mm head.The limb lengths were excellent the hip was stable in 90 of external rotation. At this point the trial components removed and we hammered into place a # 4 hi  Offset Tri-Lock stem with Gryption coating. A + 32 mm ceramic ball was then hammered into place the hip was reduced and final C-arm images obtained. The wound was thoroughly irrigated with normal saline solution. We repaired the ant capsule and the tensor fascia lot a with running 0 vicryl suture. the subcutaneous tissue was closed with 2-0 and 3-0 Vicryl suture followed by an Aquacil dressing. At this point the patient was awaken and transferred to hospital gurney without difficulty. The subcutaneous tissue with 0 and 2-0 undyed Vicryl suture and the skin with running  3-0 vicryl subcuticular suture. Aquacil dressing was applied. The patient was then unclamped, rolled supine, awaken extubated and taken to recovery room without difficulty in stable condition.   Kerin Salen 10/09/2017, 11:39 AM

## 2017-10-09 NOTE — Anesthesia Preprocedure Evaluation (Addendum)
Anesthesia Evaluation  Patient identified by MRN, date of birth, ID band Patient awake    Reviewed: Allergy & Precautions, H&P , NPO status , Patient's Chart, lab work & pertinent test results  Airway Mallampati: III  TM Distance: >3 FB Neck ROM: Full    Dental  (+) Dental Advisory Given, Teeth Intact   Pulmonary neg sleep apnea, neg COPD, former smoker,    Pulmonary exam normal breath sounds clear to auscultation       Cardiovascular Exercise Tolerance: Good hypertension, Pt. on medications (-) Past MI Normal cardiovascular exam Rhythm:Regular Rate:Normal     Neuro/Psych negative neurological ROS  negative psych ROS   GI/Hepatic negative GI ROS, Neg liver ROS, neg GERD  ,  Endo/Other  neg diabetesObesity  Renal/GU negative Renal ROS  negative genitourinary   Musculoskeletal  (+) Arthritis ,   Abdominal   Peds  Hematology negative hematology ROS (+)   Anesthesia Other Findings Hx breast cancer  Reproductive/Obstetrics                          Anesthesia Physical Anesthesia Plan  ASA: II  Anesthesia Plan: Spinal   Post-op Pain Management:    Induction:   PONV Risk Score and Plan: 2 and Treatment may vary due to age or medical condition and Propofol infusion  Airway Management Planned: Natural Airway and Nasal Cannula  Additional Equipment: None  Intra-op Plan:   Post-operative Plan:   Informed Consent: I have reviewed the patients History and Physical, chart, labs and discussed the procedure including the risks, benefits and alternatives for the proposed anesthesia with the patient or authorized representative who has indicated his/her understanding and acceptance.   Dental advisory given  Plan Discussed with: CRNA  Anesthesia Plan Comments:       Anesthesia Quick Evaluation

## 2017-10-09 NOTE — Anesthesia Postprocedure Evaluation (Signed)
Anesthesia Post Note  Patient: Haley Peterson  Procedure(s) Performed: RIGHT TOTAL HIP ARTHROPLASTY ANTERIOR APPROACH (Right Hip)     Patient location during evaluation: PACU Anesthesia Type: Spinal Level of consciousness: awake and alert Pain management: pain level controlled Vital Signs Assessment: post-procedure vital signs reviewed and stable Respiratory status: spontaneous breathing and respiratory function stable Cardiovascular status: blood pressure returned to baseline and stable Postop Assessment: spinal receding and no apparent nausea or vomiting Anesthetic complications: no    Last Vitals:  Vitals:   10/09/17 1241 10/09/17 1245  BP: 97/82   Pulse: 63 62  Resp: 15 14  Temp:    SpO2: 100% 100%    Last Pain:  Vitals:   10/09/17 0858  TempSrc: Oral  PainSc: 0-No pain                 Audry Pili

## 2017-10-09 NOTE — Transfer of Care (Signed)
Immediate Anesthesia Transfer of Care Note  Patient: Haley Peterson  Procedure(s) Performed: RIGHT TOTAL HIP ARTHROPLASTY ANTERIOR APPROACH (Right Hip)  Patient Location: PACU  Anesthesia Type:Spinal  Level of Consciousness: awake, alert , oriented and patient cooperative  Airway & Oxygen Therapy: Patient Spontanous Breathing and Patient connected to nasal cannula oxygen  Post-op Assessment: Report given to RN, Post -op Vital signs reviewed and stable, Patient moving all extremities and Patient moving all extremities X 4  Post vital signs: Reviewed and stable  Last Vitals:  Vitals:   10/09/17 0858 10/09/17 1212  BP: (!) 163/72 (!) 118/56  Pulse: 88 66  Resp: 20 12  Temp: 36.9 C   SpO2:  100%    Last Pain:  Vitals:   10/09/17 0858  TempSrc: Oral  PainSc: 0-No pain      Patients Stated Pain Goal: 3 (24/49/75 3005)  Complications: No apparent anesthesia complications

## 2017-10-09 NOTE — Evaluation (Addendum)
Physical Therapy Evaluation Patient Details Name: Haley Peterson MRN: [id number] DOB: November 06, 1946 Today's Date: 10/09/2017   History of Present Illness  Pt is a 71 y.o. female s/p elective R THA (direct anterior approach) on 10/09/17. Pertinent PMH includes bilateral TKAs (R 2010, L 2009), HTN, arthritis.    Clinical Impression  Pt presents with pain and an overall decrease in functional mobility secondary to above. PTA, pt lives alone and indep with mobility; family available to stay with pt at d/c if needed. Educ on precautions, positioning, therex, and importance of mobility. Today, pt able to amb with RW and intermittent min guard; further mobility limited secondary to fatigue and nausea. Overall, pt is moving very well. Pt would benefit from continued acute PT services to maximize functional mobility and independence prior to d/c with HHPT services.     Follow Up Recommendations Home health PT;Supervision - Intermittent    Equipment Recommendations  Rolling walker with 5" wheels    Recommendations for Other Services OT consult     Precautions / Restrictions Precautions Precautions: Fall Restrictions Weight Bearing Restrictions: Yes RLE Weight Bearing: Weight bearing as tolerated      Mobility  Bed Mobility Overal bed mobility: Needs Assistance Bed Mobility: Supine to Sit;Sit to Supine     Supine to sit: Supervision;HOB elevated Sit to supine: Min assist   General bed mobility comments: Sat with increased time and effort, but no physical assist required. MinA to assist RLE back into bed. Pt sat EOB again using BUEs holding onto blanket to assist RLE OOB  Transfers Overall transfer level: Needs assistance Equipment used: Rolling walker (2 wheeled) Transfers: Sit to/from Stand Sit to Stand: Min guard         General transfer comment: Educ on correct hand placement with RW; min guard for balance  Ambulation/Gait Ambulation/Gait assistance: Min  guard;Supervision Ambulation Distance (Feet): 100 Feet Assistive device: Rolling walker (2 wheeled) Gait Pattern/deviations: Step-through pattern;Decreased weight shift to right;Antalgic;Decreased stride length Gait velocity: Decreased Gait velocity interpretation: <1.8 ft/sec, indicative of risk for recurrent falls General Gait Details: Slow, controlled amb with RW and intermittent min guard for balance, progressing to supervision  Stairs            Wheelchair Mobility    Modified Rankin (Stroke Patients Only)       Balance Overall balance assessment: Needs assistance Sitting-balance support: No upper extremity supported;Feet unsupported Sitting balance-Leahy Scale: Good       Standing balance-Leahy Scale: Fair Standing balance comment: Able to static stand with no UE support                             Pertinent Vitals/Pain Pain Assessment: Faces Faces Pain Scale: Hurts little more Pain Location: R hip Pain Descriptors / Indicators: Operative site guarding;Sore Pain Intervention(s): Monitored during session;Limited activity within patient's tolerance    Home Living Family/patient expects to be discharged to:: Private residence Living Arrangements: Alone Available Help at Discharge: Family;Available PRN/intermittently Type of Home: House Home Access: Stairs to enter;Ramped entrance Entrance Stairs-Rails: Right Entrance Stairs-Number of Steps: 7 Home Layout: One level Home Equipment: Grab bars - tub/shower;Grab bars - toilet;Shower seat;Cane - single point Additional Comments: Son states that someone can stay with pt first few nights if needed    Prior Function Level of Independence: Independent               Hand Dominance  Extremity/Trunk Assessment   Upper Extremity Assessment Upper Extremity Assessment: Overall WFL for tasks assessed    Lower Extremity Assessment Lower Extremity Assessment: RLE deficits/detail RLE Deficits  / Details: R hip flex 2/5, knee flex/ext 3/5    Cervical / Trunk Assessment Cervical / Trunk Assessment: Normal  Communication   Communication: No difficulties  Cognition Arousal/Alertness: Awake/alert Behavior During Therapy: WFL for tasks assessed/performed Overall Cognitive Status: Within Functional Limits for tasks assessed                                        General Comments General comments (skin integrity, edema, etc.): Son present during session    Exercises Total Joint Exercises Ankle Circles/Pumps: AROM;Both;10 reps;Supine Quad Sets: AROM;Right;5 reps;Supine Heel Slides: AAROM;Right;10 reps;Supine;Other (comment)(BUE assist with blanket)   Assessment/Plan    PT Assessment Patient needs continued PT services  PT Problem List Decreased strength;Decreased range of motion;Decreased activity tolerance;Decreased balance;Decreased mobility;Decreased knowledge of use of DME;Pain       PT Treatment Interventions DME instruction;Gait training;Stair training;Functional mobility training;Therapeutic activities;Therapeutic exercise;Balance training;Patient/family education    PT Goals (Current goals can be found in the Care Plan section)  Acute Rehab PT Goals Patient Stated Goal: Return home PT Goal Formulation: With patient Time For Goal Achievement: 10/23/17 Potential to Achieve Goals: Good    Frequency 7X/week   Barriers to discharge        Co-evaluation               AM-PAC PT "6 Clicks" Daily Activity  Outcome Measure Difficulty turning over in bed (including adjusting bedclothes, sheets and blankets)?: None Difficulty moving from lying on back to sitting on the side of the bed? : Unable Difficulty sitting down on and standing up from a chair with arms (e.g., wheelchair, bedside commode, etc,.)?: A Little Help needed moving to and from a bed to chair (including a wheelchair)?: A Little Help needed walking in hospital room?: A Little Help  needed climbing 3-5 steps with a railing? : A Little 6 Click Score: 17    End of Session Equipment Utilized During Treatment: Gait belt Activity Tolerance: Patient tolerated treatment well Patient left: in bed;with call bell/phone within reach;with family/visitor present Nurse Communication: Mobility status PT Visit Diagnosis: Other abnormalities of gait and mobility (R26.89);Pain Pain - Right/Left: Right Pain - part of body: Hip    Time: 7062-3762 PT Time Calculation (min) (ACUTE ONLY): 33 min   Charges:   PT Evaluation $PT Eval Low Complexity: 1 Low PT Treatments $Gait Training: 8-22 mins   PT G Codes:   PT G-Codes **NOT FOR INPATIENT CLASS** Functional Assessment Tool Used: AM-PAC 6 Clicks Basic Mobility Functional Limitation: Mobility: Walking and moving around Mobility: Walking and Moving Around Current Status (G3151): At least 40 percent but less than 60 percent impaired, limited or restricted Mobility: Walking and Moving Around Goal Status [id number]): At least 1 percent but less than 20 percent impaired, limited or restricted   Mabeline Caras, PT, DPT Acute Rehab Services  Pager: [place] 10/09/2017, 5:48 PM

## 2017-10-09 NOTE — Interval H&P Note (Signed)
History and Physical Interval Note:  10/09/2017 8:58 AM  Haley Peterson  has presented today for surgery, with the diagnosis of RIGHT KNEE OSTEOARTHRITIS  The various methods of treatment have been discussed with the patient and family. After consideration of risks, benefits and other options for treatment, the patient has consented to  Procedure(s): RIGHT TOTAL HIP ARTHROPLASTY ANTERIOR APPROACH (Right) as a surgical intervention .  The patient's history has been reviewed, patient examined, no change in status, stable for surgery.  I have reviewed the patient's chart and labs.  Questions were answered to the patient's satisfaction.     Kerin Salen

## 2017-10-09 NOTE — Discharge Instructions (Signed)

## 2017-10-09 NOTE — Anesthesia Procedure Notes (Signed)
Spinal  Patient location during procedure: OR Start time: 10/09/2017 10:12 AM End time: 10/09/2017 10:18 AM Staffing Anesthesiologist: Audry Pili, MD Performed: anesthesiologist  Preanesthetic Checklist Completed: patient identified, surgical consent, pre-op evaluation, timeout performed, IV checked, risks and benefits discussed and monitors and equipment checked Spinal Block Patient position: sitting Prep: DuraPrep Patient monitoring: heart rate, cardiac monitor, continuous pulse ox and blood pressure Approach: midline Location: L2-3 Injection technique: single-shot Needle Needle type: Quincke  Needle gauge: 22 G Additional Notes Functioning IV was confirmed and monitors were applied. Sterile prep and drape, including hand hygiene, mask, and sterile gloves were used. The patient was positioned and the spine was prepped. The skin was anesthetized with lidocaine. First attempt at L3-4 with Pencan unsuccessful. Second attempt at L2-3 with Quincke successful. Free flow of clear CSF was obtained prior to injecting local anesthetic into the CSF. The spinal needle aspirated freely following injection. The needle was carefully withdrawn. The patient tolerated the procedure well. Consent was obtained prior to the procedure with all questions answered and concerns addressed. Risks including, but not limited to, bleeding, infection, nerve damage, paralysis, failed block, inadequate analgesia, allergic reaction, high spinal, itching, and headache were discussed and the patient wished to proceed.  Haley Don, MD

## 2017-10-10 ENCOUNTER — Other Ambulatory Visit: Payer: Self-pay

## 2017-10-10 ENCOUNTER — Encounter (HOSPITAL_COMMUNITY): Payer: Self-pay | Admitting: Orthopedic Surgery

## 2017-10-10 LAB — CBC
HCT: 28.1 % — ABNORMAL LOW (ref 36.0–46.0)
Hemoglobin: 9.3 g/dL — ABNORMAL LOW (ref 12.0–15.0)
MCH: 29 pg (ref 26.0–34.0)
MCHC: 33.1 g/dL (ref 30.0–36.0)
MCV: 87.5 fL (ref 78.0–100.0)
PLATELETS: 278 10*3/uL (ref 150–400)
RBC: 3.21 MIL/uL — AB (ref 3.87–5.11)
RDW: 13.7 % (ref 11.5–15.5)
WBC: 17.5 10*3/uL — ABNORMAL HIGH (ref 4.0–10.5)

## 2017-10-10 MED ORDER — SODIUM CHLORIDE 0.9 % IV BOLUS (SEPSIS)
500.0000 mL | Freq: Once | INTRAVENOUS | Status: AC
  Administered 2017-10-10: 500 mL via INTRAVENOUS

## 2017-10-10 NOTE — Progress Notes (Signed)
Patient's blood pressure 102/37 to the right arm, and 98/39 to the left arm. No c/o pain or discomfort noted. Dr Alyson Ingles notified. Will continue to monitor patient.Marland KitchenMarland Kitchen

## 2017-10-10 NOTE — Care Management Note (Signed)
Case Management Note  Patient Details  Name: Haley Peterson MRN: [id number] Date of Birth: 1946-10-04  Subjective/Objective:   y1 yr old female s/p right total hip arthroplasty.                 Action/Plan: Case manager spoke with patient concerning discharge plan and DME. Choice for Home Health was offered, referral was called to [place], [facility] Liaison. Patient has RW and 3in1, she says her toilets are high so will not need 3in1. Patient's son will be staying with her for a few days at discharge.     Expected Discharge Date:    10/11/17              Expected Discharge Plan:  [place]  In-House Referral:     Discharge planning Services  CM Consult  Post Acute Care Choice:  Home Health Choice offered to:  Patient  DME Arranged:  N/A(has RW, 3in1 and has elevated toilets) DME Agency:  NA  HH Arranged:  PT HH Agency:  [place]  Status of Service:  Completed, signed off  If discussed at [place] of Stay Meetings, dates discussed:    Additional Comments:  Ninfa Meeker, RN 10/10/2017, 3:58 PM

## 2017-10-10 NOTE — Progress Notes (Addendum)
Physical Therapy Treatment Patient Details Name: Haley Peterson MRN: [id number] DOB: 12-30-1945 Today's Date: 10/10/2017    History of Present Illness Pt is a 71 y.o. female s/p elective R THA (direct anterior approach) on 10/09/17. Pertinent PMH includes bilateral TKAs (R 2010, L 2009), HTN, arthritis.    PT Comments    Pt was seen for further progression of therapy with longer walk and worked on easing her symptoms with activity due to having been sitting up three hours without a walk.  Discussion about increasing her frequency of walks and progressing to shorter trips if needed to accomplish this.  Will progress fast at home and has no steps to manage to get into the house.  Follow acutely as needed until DC.   Follow Up Recommendations  Home health PT;Supervision - Intermittent     Equipment Recommendations  Rolling walker with 5" wheels    Recommendations for Other Services       Precautions / Restrictions Precautions Precautions: Fall Precaution Comments: hip Restrictions Weight Bearing Restrictions: Yes RLE Weight Bearing: Weight bearing as tolerated    Mobility  Bed Mobility Overal bed mobility: Needs Assistance Bed Mobility: Sit to Supine     Supine to sit: Supervision;Min guard;HOB elevated Sit to supine: Min assist   General bed mobility comments: pt worked on backward scooting to the head of bed then assisted her to get RLE onto bed  Transfers Overall transfer level: Needs assistance Equipment used: Rolling walker (2 wheeled) Transfers: Sit to/from Stand Sit to Stand: Min guard         General transfer comment: reviewed hand placement but pt can sequence well  Ambulation/Gait Ambulation/Gait assistance: Min guard Ambulation Distance (Feet): 180 Feet Assistive device: Rolling walker (2 wheeled) Gait Pattern/deviations: Step-through pattern;Decreased stride length;Narrow base of support;Trunk flexed;Antalgic Gait velocity: Decreased Gait velocity  interpretation: Below normal speed for age/gender General Gait Details: gradually increased control of RLE with reminders for stepping through   Stairs Stairs: Yes   Stair Management: No rails;With walker;Forwards;Step to pattern      Wheelchair Mobility    Modified Rankin (Stroke Patients Only)       Balance Overall balance assessment: Needs assistance Sitting-balance support: Feet supported Sitting balance-Leahy Scale: Good     Standing balance support: Bilateral upper extremity supported;During functional activity Standing balance-Leahy Scale: Fair Standing balance comment: increased UE support to stand due to increased pain today per pt                            Cognition Arousal/Alertness: Awake/alert Behavior During Therapy: WFL for tasks assessed/performed Overall Cognitive Status: Within Functional Limits for tasks assessed                                        Exercises Total Joint Exercises Ankle Circles/Pumps: AROM;Both;5 reps Quad Sets: AROM;Both;10 reps Heel Slides: AROM;Both;10 reps    General Comments        Pertinent Vitals/Pain Pain Assessment: Faces Pain Score: 7  Faces Pain Scale: Hurts little more Pain Location: R hip Pain Descriptors / Indicators: Operative site guarding;Sore Pain Intervention(s): Limited activity within patient's tolerance;Monitored during session;Premedicated before session;Repositioned    Home Living                      Prior Function  PT Goals (current goals can now be found in the care plan section) Acute Rehab PT Goals Patient Stated Goal: Return home Progress towards PT goals: Progressing toward goals    Frequency    7X/week      PT Plan Current plan remains appropriate    Co-evaluation              AM-PAC PT "6 Clicks" Daily Activity  Outcome Measure  Difficulty turning over in bed (including adjusting bedclothes, sheets and blankets)?:  None Difficulty moving from lying on back to sitting on the side of the bed? : Unable Difficulty sitting down on and standing up from a chair with arms (e.g., wheelchair, bedside commode, etc,.)?: Unable Help needed moving to and from a bed to chair (including a wheelchair)?: A Little Help needed walking in hospital room?: A Little Help needed climbing 3-5 steps with a railing? : A Little 6 Click Score: 15    End of Session Equipment Utilized During Treatment: Gait belt Activity Tolerance: Patient tolerated treatment well Patient left: with call bell/phone within reach;in chair;with nursing/sitter in room Nurse Communication: Mobility status PT Visit Diagnosis: Other abnormalities of gait and mobility (R26.89);Pain Pain - Right/Left: Right Pain - part of body: Hip     Time: 4196-2229 PT Time Calculation (min) (ACUTE ONLY): 34 min  Charges:  $Gait Training: 8-22 mins $Therapeutic Exercise: 8-22 mins $Therapeutic Activity: 8-22 mins                    G Codes:  Functional Assessment Tool Used: AM-PAC 6 Clicks Basic Mobility     Ramond Dial 10/10/2017, 1:54 PM   Mee Hives, PT MS Acute Rehab Dept. Number: [place] and [place]

## 2017-10-10 NOTE — Progress Notes (Signed)
Physical Therapy Treatment Patient Details Name: Haley Peterson MRN: [id number] DOB: 28-Oct-1946 Today's Date: 10/10/2017    History of Present Illness Pt is a 71 y.o. female s/p elective R THA (direct anterior approach) on 10/09/17. Pertinent PMH includes bilateral TKAs (R 2010, L 2009), HTN, arthritis.    PT Comments    Pt was seen for evaluation of her current mobility and worked on her gait quality, adjusted walker height and reviewed her exercise program.  Her plan is to progress as possible with gait and exercise to transition home when ready, and manage pain as needed as she is more uncomfortable today despite progress.   Follow Up Recommendations  Home health PT;Supervision - Intermittent     Equipment Recommendations  Rolling walker with 5" wheels    Recommendations for Other Services       Precautions / Restrictions Precautions Precautions: Fall Precaution Comments: hip Restrictions Weight Bearing Restrictions: Yes RLE Weight Bearing: Weight bearing as tolerated    Mobility  Bed Mobility Overal bed mobility: Needs Assistance Bed Mobility: Supine to Sit     Supine to sit: Supervision;Min guard;HOB elevated     General bed mobility comments: adjusted RLE on the bed and pt then went through her routine to sit up slowly  Transfers Overall transfer level: Needs assistance Equipment used: Rolling walker (2 wheeled) Transfers: Sit to/from Stand Sit to Stand: Min guard         General transfer comment: reviewed hand placement but pt can sequence well  Ambulation/Gait Ambulation/Gait assistance: Min guard Ambulation Distance (Feet): 120 Feet Assistive device: Rolling walker (2 wheeled) Gait Pattern/deviations: Step-through pattern;Decreased stride length;Narrow base of support;Trunk flexed;Antalgic Gait velocity: Decreased Gait velocity interpretation: Below normal speed for age/gender General Gait Details: gradually increased control of RLE with reminders for  stepping through   Stairs            Wheelchair Mobility    Modified Rankin (Stroke Patients Only)       Balance Overall balance assessment: Needs assistance Sitting-balance support: Feet supported Sitting balance-Leahy Scale: Good     Standing balance support: Bilateral upper extremity supported;During functional activity Standing balance-Leahy Scale: Fair Standing balance comment: increased UE support to stand due to increased pain today per pt                            Cognition Arousal/Alertness: Awake/alert Behavior During Therapy: WFL for tasks assessed/performed Overall Cognitive Status: Within Functional Limits for tasks assessed                                        Exercises Total Joint Exercises Ankle Circles/Pumps: AROM;Both;5 reps Quad Sets: AROM;Both;10 reps Heel Slides: AROM;Both;10 reps    General Comments        Pertinent Vitals/Pain Pain Assessment: Faces Pain Score: 7  Faces Pain Scale: Hurts even more Pain Location: R hip Pain Descriptors / Indicators: Operative site guarding;Sore Pain Intervention(s): Limited activity within patient's tolerance;Monitored during session;Premedicated before session;Repositioned    Home Living                      Prior Function            PT Goals (current goals can now be found in the care plan section) Acute Rehab PT Goals Patient Stated Goal: Return home Progress towards  PT goals: Progressing toward goals    Frequency    7X/week      PT Plan Current plan remains appropriate    Co-evaluation              AM-PAC PT "6 Clicks" Daily Activity  Outcome Measure  Difficulty turning over in bed (including adjusting bedclothes, sheets and blankets)?: None Difficulty moving from lying on back to sitting on the side of the bed? : Unable Difficulty sitting down on and standing up from a chair with arms (e.g., wheelchair, bedside commode, etc,.)?:  Unable Help needed moving to and from a bed to chair (including a wheelchair)?: A Little Help needed walking in hospital room?: A Little Help needed climbing 3-5 steps with a railing? : A Little 6 Click Score: 15    End of Session Equipment Utilized During Treatment: Gait belt Activity Tolerance: Patient tolerated treatment well Patient left: with call bell/phone within reach;in chair;with nursing/sitter in room Nurse Communication: Mobility status PT Visit Diagnosis: Other abnormalities of gait and mobility (R26.89);Pain Pain - Right/Left: Right Pain - part of body: Hip     Time: 8032-1224 PT Time Calculation (min) (ACUTE ONLY): 40 min  Charges:  $Gait Training: 8-22 mins $Therapeutic Exercise: 8-22 mins $Therapeutic Activity: 8-22 mins                    G Codes:  Functional Assessment Tool Used: AM-PAC 6 Clicks Basic Mobility     Ramond Dial 10/10/2017, 12:07 PM   Mee Hives, PT MS Acute Rehab Dept. Number: [place] and [place]

## 2017-10-10 NOTE — Progress Notes (Signed)
PATIENT ID: Haley Peterson  MRN: [id number]  DOB/AGE:  Sep 29, 1946 / 71 y.o.  1 Day Post-Op Procedure(s) (LRB): RIGHT TOTAL HIP ARTHROPLASTY ANTERIOR APPROACH (Right)    PROGRESS NOTE Subjective: Patient is alert, oriented, no Nausea, no Vomiting, yes passing gas, . Taking PO well. Denies SOB, Chest or Calf Pain. Using Incentive Spirometer, PAS in place. Ambulate patient is walked in the room without difficulty Patient reports pain as  2/10  .    Objective: Vital signs in last 24 hours: Vitals:   10/09/17 2001 10/09/17 2300 10/10/17 0445 10/10/17 0552  BP: (!) 119/53 91/64 (!) 97/40 (!) 158/76  Pulse: 91 95 90 (!) 101  Resp: 20 18 18  (!) 22  Temp: 99.4 F (37.4 C) (!) 100.6 F (38.1 C) 98.5 F (36.9 C) 97.7 F (36.5 C)  TempSrc: Oral Oral Oral Oral  SpO2: 98% 94% 96% 97%  Weight:      Height:          Intake/Output from previous day: I/O last 3 completed shifts: In: 3208.3 [P.O.:600; I.V.:2608.3] Out: 1325 [Urine:1025; Blood:300]   Intake/Output this shift: No intake/output data recorded.   LABORATORY DATA: Recent Labs    10/10/17 0711  WBC 17.5*  HGB 9.3*  HCT 28.1*  PLT 278    Examination: Neurologically intact ABD soft Neurovascular intact Sensation intact distally Intact pulses distally Dorsiflexion/Plantar flexion intact Incision: dressing C/D/I No cellulitis present Compartment soft} XR AP&Lat of hip shows well placed\fixed THA  Assessment:   1 Day Post-Op Procedure(s) (LRB): RIGHT TOTAL HIP ARTHROPLASTY ANTERIOR APPROACH (Right) ADDITIONAL DIAGNOSIS:  Expected Acute Blood Loss Anemia,  Plan: PT/OT WBAT, THA  DVT Prophylaxis: SCDx72 hrs, ASA 325 mg BID x 2 weeks  DISCHARGE PLAN: Home, today if patient meets all PT goals otherwise tomorrow.  DISCHARGE NEEDS: HHPT, Walker and 3-in-1 comode seat

## 2017-10-11 LAB — CBC
HCT: 27.6 % — ABNORMAL LOW (ref 36.0–46.0)
Hemoglobin: 8.9 g/dL — ABNORMAL LOW (ref 12.0–15.0)
MCH: 28.9 pg (ref 26.0–34.0)
MCHC: 32.2 g/dL (ref 30.0–36.0)
MCV: 89.6 fL (ref 78.0–100.0)
PLATELETS: 255 10*3/uL (ref 150–400)
RBC: 3.08 MIL/uL — ABNORMAL LOW (ref 3.87–5.11)
RDW: 14 % (ref 11.5–15.5)
WBC: 22.3 10*3/uL — ABNORMAL HIGH (ref 4.0–10.5)

## 2017-10-11 NOTE — Progress Notes (Signed)
Discharge teaching done with patient. Prescriptions and discharge instructions given to patient. Patient verbalized understanding. IV removed. Patient is dressed in room with all belongings waiting for her son to pick her up.

## 2017-10-11 NOTE — Discharge Summary (Signed)
Patient ID: NIREL BABLER MRN: [id number] DOB/AGE: 1946/02/13 71 y.o.  Admit date: 10/09/2017 Discharge date: 10/11/2017  Admission Diagnoses:  Principal Problem:   Osteoarthritis of right hip Active Problems:   Primary osteoarthritis of right hip   Discharge Diagnoses:  Same  Past Medical History:  Diagnosis Date  . Arthritis   . Bone spur    left heel  . Cancer ([place])   . Dyspnea    recent last 2 months when walk to mail box- "winded" less active -no exercise 2 months   . History of breast cancer    left, right  . Hypertension   . Pneumonia 10/2012    Surgeries: Procedure(s): RIGHT TOTAL HIP ARTHROPLASTY ANTERIOR APPROACH on 10/09/2017   Consultants:   Discharged Condition: Improved  Hospital Course: Chloe SHARMON CHERAMIE is an 71 y.o. female who was admitted 10/09/2017 for operative treatment ofOsteoarthritis of right hip. Patient has severe unremitting pain that affects sleep, daily activities, and work/hobbies. After pre-op clearance the patient was taken to the operating room on 10/09/2017 and underwent  Procedure(s): RIGHT TOTAL HIP ARTHROPLASTY ANTERIOR APPROACH.    Patient was given perioperative antibiotics:  Anti-infectives (From admission, onward)   Start     Dose/Rate Route Frequency Ordered Stop   10/09/17 0915  vancomycin (VANCOCIN) IVPB 1000 mg/200 mL premix     1,000 mg 200 mL/hr over 60 Minutes Intravenous  Once 10/09/17 0904 10/09/17 1005   10/09/17 0903  vancomycin (VANCOCIN) 1-5 GM/200ML-% IVPB    Comments:  Forte, Lindsi   : cabinet override      10/09/17 0903 10/09/17 2114       Patient was given sequential compression devices, early ambulation, and chemoprophylaxis to prevent DVT.  Patient benefited maximally from hospital stay and there were no complications.    Recent vital signs:  Patient Vitals for the past 24 hrs:  BP Temp Temp src Pulse Resp SpO2  10/11/17 0432 (!) 113/52 - - - - 97 %  10/10/17 1946 (!) 112/54 98 F (36.7 C) Oral 89 16 98  %  10/10/17 1700 (!) 115/52 - - - - -  10/10/17 1626 (!) 102/37 - - - - -  10/10/17 1500 (!) 97/40 98.2 F (36.8 C) Oral 83 18 97 %     Recent laboratory studies:  Recent Labs    10/10/17 0711  WBC 17.5*  HGB 9.3*  HCT 28.1*  PLT 278     Discharge Medications:   Allergies as of 10/11/2017      Reactions   Penicillins Other (See Comments)   "LOSS OF VISION" Has patient had a PCN reaction causing immediate rash, facial/tongue/throat swelling, SOB or lightheadedness with hypotension: No Has patient had a PCN reaction causing severe rash involving mucus membranes or skin necrosis: No Has patient had a PCN reaction that required hospitalization: No Has patient had a PCN reaction occurring within the last 10 years: No If all of the above answers are "NO", then may proceed with Cephalosporin use.   Oxytetracycline Other (See Comments)   Fever blisters   Tamoxifen Other (See Comments)   "GENERAL ILLNESS WITH LIGHTHEADNESS"   Aromasin [exemestane] Other (See Comments)   UNSPECIFIED REACTION  Pt is unsure of what reaction was...   Codeine Other (See Comments)   LIGHTHEADED      Medication List    STOP taking these medications   ibuprofen 200 MG tablet Commonly known as:  ADVIL,MOTRIN     TAKE these medications   aspirin  EC 325 MG tablet Take 1 tablet (325 mg total) by mouth 2 (two) times daily.   betamethasone dipropionate 0.05 % cream Commonly known as:  DIPROLENE Apply 1 application 2 (two) times daily as needed topically (skin irritation).   CALTRATE 600 PLUS-VIT D PO Take 1 tablet by mouth daily with supper.   cholecalciferol 1000 units tablet Commonly known as:  VITAMIN D Take 1,000 Units by mouth daily with supper.   GARLIQUE 400 MG Tbec Generic drug:  Garlic Take 1 tablet by mouth daily with supper.   losartan-hydrochlorothiazide 100-12.5 MG tablet Commonly known as:  HYZAAR Take 1 tablet by mouth daily with breakfast.   oxyCODONE-acetaminophen 5-325  MG tablet Commonly known as:  ROXICET Take 1 tablet by mouth every 4 (four) hours as needed.   tiZANidine 2 MG tablet Commonly known as:  ZANAFLEX Take 1 tablet (2 mg total) by mouth every 6 (six) hours as needed for muscle spasms.   TURMERIC CURCUMIN PO Take 3 capsules daily with supper by mouth.            Durable Medical Equipment  (From admission, onward)        Start     Ordered   10/09/17 1603  DME Walker rolling  Once    Question:  Patient needs a walker to treat with the following condition  Answer:  Status post right hip replacement   10/09/17 1602   10/09/17 1603  DME 3 n 1  Once     10/09/17 1602   10/09/17 1603  DME Bedside commode  Once    Question:  Patient needs a bedside commode to treat with the following condition  Answer:  Status post right hip replacement   10/09/17 1602      Diagnostic Studies: Dg Chest 2 View  Result Date: 09/27/2017 CLINICAL DATA:  Preoperative examination prior total hip joint prosthesis placement. History of breast malignancy, hypertension, and previous episodes of pneumonia, former smoker. EXAM: CHEST  2 VIEW COMPARISON:  PA and lateral chest x-ray of October 25, 2011 FINDINGS: The lungs are mildly hyperinflated with hemidiaphragm flattening. There is no focal infiltrate. No pulmonary parenchymal nodules or masses are observed. The heart and pulmonary vascularity are normal. The mediastinum is normal in width. There are surgical clips in the left axillary region. There is multilevel degenerative disc disease of the thoracic spine. IMPRESSION: Chronic bronchitic-smoking related changes. There is no acute cardiopulmonary abnormality. Electronically Signed   By: David  Martinique M.D.   On: 09/27/2017 12:56   Dg C-arm 1-60 Min  Result Date: 10/09/2017 CLINICAL DATA:  Right anterior total hip arthroplasty EXAM: OPERATIVE RIGHT HIP (WITH PELVIS IF PERFORMED) 2 VIEWS TECHNIQUE: Fluoroscopic spot image(s) were submitted for interpretation  post-operatively. COMPARISON:  None. FINDINGS: Changes of right hip replacement. No visible hardware or bony complicating feature. Normal AP alignment. IMPRESSION: Right hip replacement.  No visible complicating feature. Electronically Signed   By: Rolm Baptise M.D.   On: 10/09/2017 11:53   Dg Hip Operative Unilat With Pelvis Right  Result Date: 10/09/2017 CLINICAL DATA:  Right anterior total hip arthroplasty EXAM: OPERATIVE RIGHT HIP (WITH PELVIS IF PERFORMED) 2 VIEWS TECHNIQUE: Fluoroscopic spot image(s) were submitted for interpretation post-operatively. COMPARISON:  None. FINDINGS: Changes of right hip replacement. No visible hardware or bony complicating feature. Normal AP alignment. IMPRESSION: Right hip replacement.  No visible complicating feature. Electronically Signed   By: Rolm Baptise M.D.   On: 10/09/2017 11:53    Disposition: 01-Home or  Self Care  Discharge Instructions    Call MD / Call 911   Complete by:  As directed    If you experience chest pain or shortness of breath, CALL 911 and be transported to the hospital emergency room.  If you develope a fever above 101 F, pus (white drainage) or increased drainage or redness at the wound, or calf pain, call your surgeon's office.   Constipation Prevention   Complete by:  As directed    Drink plenty of fluids.  Prune juice may be helpful.  You may use a stool softener, such as Colace (over the counter) 100 mg twice a day.  Use MiraLax (over the counter) for constipation as needed.   Diet - low sodium heart healthy   Complete by:  As directed    Driving restrictions   Complete by:  As directed    No driving for 2 weeks   Follow the hip precautions as taught in Physical Therapy   Complete by:  As directed    Increase activity slowly as tolerated   Complete by:  As directed    Patient may shower   Complete by:  As directed    You may shower without a dressing once there is no drainage.  Do not wash over the wound.  If drainage  remains, cover wound with plastic wrap and then shower.      Follow-up Information    Frederik Pear, MD Follow up in 2 week(s).   Specialty:  Orthopedic Surgery Contact information: [place] [address] [phone]        [facility] Follow up.   Specialty:  [place] Why:  A representative from [place] will contact you to arrange start date and time for your therapy. Contact information: [address] [place] [address] [phone]            Signed: Theodosia Quay 10/11/2017, 7:58 AM

## 2017-10-11 NOTE — Progress Notes (Signed)
Physical Therapy Treatment Patient Details Name: Haley Peterson MRN: [id number] DOB: [birth year] Today's Date: 10/11/2017    History of Present Illness Pt is a 71 y.o. female s/p elective R THA (direct anterior approach) on 10/09/17. Pertinent PMH includes bilateral TKAs (R 2010, L 2009), HTN, arthritis.    PT Comments    Pt is up to side of bed with nearly no help, but requires supervised assisted mobility to get up to BR then to hallway with stairs.  Her plan is to go home with HHPT and family to assist as needed.  Will progress to longer gait distances and get pt to demonstrate her recall of exercises in future visits.   Follow Up Recommendations  Home health PT;Supervision - Intermittent     Equipment Recommendations  Rolling walker with 5" wheels    Recommendations for Other Services       Precautions / Restrictions Precautions Precautions: Fall Precaution Comments: hip Restrictions Weight Bearing Restrictions: Yes RLE Weight Bearing: Weight bearing as tolerated    Mobility  Bed Mobility Overal bed mobility: Needs Assistance Bed Mobility: Supine to Sit     Supine to sit: Supervision;Min guard Sit to supine: Min guard;Min assist   General bed mobility comments: worked on lifting RLE with LLE supporting  Transfers Overall transfer level: Needs assistance Equipment used: Rolling walker (2 wheeled) Transfers: Sit to/from Stand Sit to Stand: Min guard         General transfer comment: reminded hand placement  Ambulation/Gait Ambulation/Gait assistance: Min guard Ambulation Distance (Feet): 250 Feet Assistive device: Rolling walker (2 wheeled) Gait Pattern/deviations: Step-through pattern;Decreased stride length;Narrow base of support;Trunk flexed;Antalgic Gait velocity: Decreased Gait velocity interpretation: Below normal speed for age/gender General Gait Details: reminded her to work on stepping through with good effort    Stairs Stairs: Yes   Stair  Management: One rail Right;Step to pattern;Forwards Number of Stairs: 7 General stair comments: pt followed instructions perfectly and had her turn to manage lines which she does well  Wheelchair Mobility    Modified Rankin (Stroke Patients Only)       Balance Overall balance assessment: Needs assistance Sitting-balance support: Feet supported Sitting balance-Leahy Scale: Good     Standing balance support: Bilateral upper extremity supported;During functional activity Standing balance-Leahy Scale: Fair Standing balance comment: pain is not affecting balance per pt                            Cognition Arousal/Alertness: Awake/alert Behavior During Therapy: WFL for tasks assessed/performed Overall Cognitive Status: Within Functional Limits for tasks assessed                                        Exercises Total Joint Exercises Ankle Circles/Pumps: AROM;Both;5 reps Quad Sets: AROM;Both;10 reps Gluteal Sets: AROM;Both;10 reps Heel Slides: AROM;Both;10 reps Hip ABduction/ADduction: AROM;Both;10 reps    General Comments        Pertinent Vitals/Pain Pain Assessment: 0-10 Pain Score: 6  Faces Pain Scale: No hurt Pain Location: R hip Pain Descriptors / Indicators: Operative site guarding Pain Intervention(s): Monitored during session;Premedicated before session;Repositioned;Ice applied    Home Living                      Prior Function            PT Goals (current goals can  now be found in the care plan section) Acute Rehab PT Goals Patient Stated Goal: Return home PT Goal Formulation: With patient Progress towards PT goals: Progressing toward goals    Frequency    7X/week      PT Plan Current plan remains appropriate    Co-evaluation              AM-PAC PT "6 Clicks" Daily Activity  Outcome Measure  Difficulty turning over in bed (including adjusting bedclothes, sheets and blankets)?: None Difficulty  moving from lying on back to sitting on the side of the bed? : Unable Difficulty sitting down on and standing up from a chair with arms (e.g., wheelchair, bedside commode, etc,.)?: Unable Help needed moving to and from a bed to chair (including a wheelchair)?: A Little Help needed walking in hospital room?: A Little Help needed climbing 3-5 steps with a railing? : A Little 6 Click Score: 15    End of Session Equipment Utilized During Treatment: Gait belt Activity Tolerance: Patient tolerated treatment well Patient left: with call bell/phone within reach;in chair;with nursing/sitter in room Nurse Communication: Mobility status PT Visit Diagnosis: Other abnormalities of gait and mobility (R26.89);Pain Pain - Right/Left: Right Pain - part of body: Hip     Time: 2694-8546 PT Time Calculation (min) (ACUTE ONLY): 28 min  Charges:  $Gait Training: 8-22 mins $Therapeutic Exercise: 8-22 mins                    G Codes:  Functional Assessment Tool Used: AM-PAC 6 Clicks Basic Mobility     Ramond Dial 10/11/2017, 1:38 PM   Mee Hives, PT MS Acute Rehab Dept. Number: [place] and [place]

## 2017-10-11 NOTE — Progress Notes (Signed)
Physical Therapy Treatment Patient Details Name: STEFHANIE KACHMAR MRN: [id number] DOB: 06-30-1946 Today's Date: 10/11/2017    History of Present Illness Pt is a 71 y.o. female s/p elective R THA (direct anterior approach) on 10/09/17. Pertinent PMH includes bilateral TKAs (R 2010, L 2009), HTN, arthritis.    PT Comments    Pt is able to walk better with more fluid and safe pattern, distributing wgt more equally on LE's with RW to increase UE support.  Her plan is to go home with family and friends offering assistance as needed, and had completed enough stairs with no AD to get into her house safely with supervision.  Will see tomorrow as needed depending on her DC plans, to focus on LE Strength and balance on RW.   Follow Up Recommendations  Home health PT;Supervision - Intermittent     Equipment Recommendations  Rolling walker with 5" wheels    Recommendations for Other Services       Precautions / Restrictions Precautions Precautions: Fall Precaution Comments: hip Restrictions Weight Bearing Restrictions: Yes RLE Weight Bearing: Weight bearing as tolerated    Mobility  Bed Mobility Overal bed mobility: Needs Assistance Bed Mobility: Supine to Sit     Supine to sit: Supervision;Min guard Sit to supine: Min guard;Min assist   General bed mobility comments: worked on lifting RLE with LLE supporting  Transfers Overall transfer level: Needs assistance Equipment used: Rolling walker (2 wheeled) Transfers: Sit to/from Stand Sit to Stand: Min guard         General transfer comment: reminded hand placement  Ambulation/Gait Ambulation/Gait assistance: Min guard Ambulation Distance (Feet): 300 Feet Assistive device: Rolling walker (2 wheeled) Gait Pattern/deviations: Step-through pattern;Decreased stride length;Narrow base of support;Trunk flexed;Antalgic Gait velocity: Decreased Gait velocity interpretation: Below normal speed for age/gender General Gait Details:  reminded her to work on stepping through with good effort    Stairs Stairs: Yes   Stair Management: One rail Right;Step to pattern;Forwards Number of Stairs: 7 General stair comments: pt followed instructions perfectly and had her turn to manage lines which she does well  Wheelchair Mobility    Modified Rankin (Stroke Patients Only)       Balance Overall balance assessment: Needs assistance Sitting-balance support: Feet supported Sitting balance-Leahy Scale: Good     Standing balance support: Bilateral upper extremity supported;During functional activity Standing balance-Leahy Scale: Fair Standing balance comment: pain is not affecting balance per pt                            Cognition Arousal/Alertness: Awake/alert Behavior During Therapy: WFL for tasks assessed/performed Overall Cognitive Status: Within Functional Limits for tasks assessed                                        Exercises Total Joint Exercises Ankle Circles/Pumps: AROM;Both;5 reps Quad Sets: AROM;Both;10 reps Gluteal Sets: AROM;Both;10 reps Heel Slides: AROM;Both;10 reps Hip ABduction/ADduction: AROM;Both;10 reps Straight Leg Raises: AAROM;Both;10 reps;AROM    General Comments        Pertinent Vitals/Pain Pain Assessment: 0-10 Pain Score: 6  Faces Pain Scale: No hurt Pain Location: R hip Pain Descriptors / Indicators: Operative site guarding Pain Intervention(s): Monitored during session;Premedicated before session;Repositioned;Ice applied    Home Living  Prior Function            PT Goals (current goals can now be found in the care plan section) Acute Rehab PT Goals Patient Stated Goal: Return home PT Goal Formulation: With patient Progress towards PT goals: Progressing toward goals    Frequency    7X/week      PT Plan Current plan remains appropriate    Co-evaluation              AM-PAC PT "6 Clicks"  Daily Activity  Outcome Measure  Difficulty turning over in bed (including adjusting bedclothes, sheets and blankets)?: None Difficulty moving from lying on back to sitting on the side of the bed? : Unable Difficulty sitting down on and standing up from a chair with arms (e.g., wheelchair, bedside commode, etc,.)?: Unable Help needed moving to and from a bed to chair (including a wheelchair)?: A Little Help needed walking in hospital room?: A Little Help needed climbing 3-5 steps with a railing? : A Little 6 Click Score: 15    End of Session Equipment Utilized During Treatment: Gait belt Activity Tolerance: Patient tolerated treatment well Patient left: with call bell/phone within reach;in chair;with nursing/sitter in room Nurse Communication: Mobility status PT Visit Diagnosis: Other abnormalities of gait and mobility (R26.89);Pain Pain - Right/Left: Right Pain - part of body: Hip     Time: 0349-1791 PT Time Calculation (min) (ACUTE ONLY): 29 min  Charges:  $Gait Training: 8-22 mins $Therapeutic Exercise: 8-22 mins                    G Codes:  Functional Assessment Tool Used: AM-PAC 6 Clicks Basic Mobility     Ramond Dial 10/11/2017, 4:09 PM   Mee Hives, PT MS Acute Rehab Dept. Number: [place] and [place]

## 2017-10-11 NOTE — Progress Notes (Signed)
PATIENT ID: ALYCEN MACK  MRN: [id number]  DOB/AGE:  May 28, 1946 / 71 y.o.  2 Days Post-Op Procedure(s) (LRB): RIGHT TOTAL HIP ARTHROPLASTY ANTERIOR APPROACH (Right)    PROGRESS NOTE Subjective: Patient is alert, oriented, no current Nausea, no Vomiting, yes passing gas, . Taking PO well. Denies SOB, Chest or Calf Pain. Using Incentive Spirometer, PAS in place. Ambulate WBAT with pt walking 180 ft with therapy Patient reports pain as  7/10 with activity .    Objective: Vital signs in last 24 hours: Vitals:   10/10/17 1626 10/10/17 1700 10/10/17 1946 10/11/17 0432  BP: (!) 102/37 (!) 115/52 (!) 112/54 (!) 113/52  Pulse:   89   Resp:   16   Temp:   98 F (36.7 C)   TempSrc:   Oral   SpO2:   98% 97%  Weight:      Height:          Intake/Output from previous day: I/O last 3 completed shifts: In: 1768.3 [P.O.:960; I.V.:808.3] Out: 425 [Urine:425]   Intake/Output this shift: No intake/output data recorded.   LABORATORY DATA: Recent Labs    10/10/17 0711  WBC 17.5*  HGB 9.3*  HCT 28.1*  PLT 278    Examination: Neurologically intact Neurovascular intact Sensation intact distally Intact pulses distally Dorsiflexion/Plantar flexion intact Incision: dressing C/D/I and no drainage No cellulitis present Compartment soft} XR AP&Lat of hip shows well placed\fixed THA  Assessment:   2 Days Post-Op Procedure(s) (LRB): RIGHT TOTAL HIP ARTHROPLASTY ANTERIOR APPROACH (Right) ADDITIONAL DIAGNOSIS:  Expected Acute Blood Loss Anemia,    Plan: PT/OT WBAT, THA  DVT Prophylaxis: SCDx72 hrs, ASA 325 mg BID x 2 weeks  DISCHARGE PLAN: Home  DISCHARGE NEEDS: HHPT, Walker and 3-in-1 comode seat

## 2017-10-12 DIAGNOSIS — I10 Essential (primary) hypertension: Secondary | ICD-10-CM | POA: Diagnosis not present

## 2017-10-12 DIAGNOSIS — M1991 Primary osteoarthritis, unspecified site: Secondary | ICD-10-CM | POA: Diagnosis not present

## 2017-10-12 DIAGNOSIS — Z7982 Long term (current) use of aspirin: Secondary | ICD-10-CM | POA: Diagnosis not present

## 2017-10-12 DIAGNOSIS — Z79891 Long term (current) use of opiate analgesic: Secondary | ICD-10-CM | POA: Diagnosis not present

## 2017-10-12 DIAGNOSIS — Z471 Aftercare following joint replacement surgery: Secondary | ICD-10-CM | POA: Diagnosis not present

## 2017-10-12 DIAGNOSIS — Z96641 Presence of right artificial hip joint: Secondary | ICD-10-CM | POA: Diagnosis not present

## 2017-10-16 DIAGNOSIS — I10 Essential (primary) hypertension: Secondary | ICD-10-CM | POA: Diagnosis not present

## 2017-10-16 DIAGNOSIS — M1991 Primary osteoarthritis, unspecified site: Secondary | ICD-10-CM | POA: Diagnosis not present

## 2017-10-16 DIAGNOSIS — Z7982 Long term (current) use of aspirin: Secondary | ICD-10-CM | POA: Diagnosis not present

## 2017-10-16 DIAGNOSIS — Z96641 Presence of right artificial hip joint: Secondary | ICD-10-CM | POA: Diagnosis not present

## 2017-10-16 DIAGNOSIS — Z79891 Long term (current) use of opiate analgesic: Secondary | ICD-10-CM | POA: Diagnosis not present

## 2017-10-16 DIAGNOSIS — Z471 Aftercare following joint replacement surgery: Secondary | ICD-10-CM | POA: Diagnosis not present

## 2017-10-24 DIAGNOSIS — M1611 Unilateral primary osteoarthritis, right hip: Secondary | ICD-10-CM | POA: Diagnosis not present

## 2017-10-24 DIAGNOSIS — Z96641 Presence of right artificial hip joint: Secondary | ICD-10-CM | POA: Diagnosis not present

## 2017-10-24 DIAGNOSIS — Z471 Aftercare following joint replacement surgery: Secondary | ICD-10-CM | POA: Diagnosis not present

## 2017-10-30 ENCOUNTER — Ambulatory Visit (HOSPITAL_COMMUNITY): Payer: PPO | Attending: Orthopedic Surgery | Admitting: Physical Therapy

## 2017-10-30 ENCOUNTER — Encounter (HOSPITAL_COMMUNITY): Payer: Self-pay | Admitting: Physical Therapy

## 2017-10-30 DIAGNOSIS — M6281 Muscle weakness (generalized): Secondary | ICD-10-CM | POA: Insufficient documentation

## 2017-10-30 DIAGNOSIS — M25551 Pain in right hip: Secondary | ICD-10-CM

## 2017-10-30 DIAGNOSIS — R262 Difficulty in walking, not elsewhere classified: Secondary | ICD-10-CM | POA: Diagnosis not present

## 2017-10-30 NOTE — Therapy (Signed)
[facility] [place], [place], [address] Phone: [phone]   Fax:  [phone]  Physical Therapy Evaluation  Patient Details  Name: Haley Peterson MRN: [id number] Date of Birth: April 18, 1946 Referring Provider: Joanell Rising    Encounter Date: 10/30/2017  PT End of Session - 10/30/17 1212    Visit Number  1    Number of Visits  8    Date for PT Re-Evaluation  11/29/17    Authorization Type  [employer]    Authorization - Visit Number  1    Authorization - Number of Visits  8    PT Start Time  1120    PT Stop Time  1200    PT Time Calculation (min)  40 min    Activity Tolerance  Patient tolerated treatment well       Past Medical History:  Diagnosis Date  . Arthritis   . Bone spur    left heel  . Cancer ([place])   . Dyspnea    recent last 2 months when walk to mail box- "winded" less active -no exercise 2 months   . History of breast cancer    left, right  . Hypertension   . Pneumonia 10/2012    Past Surgical History:  Procedure Laterality Date  . ABDOMINAL HYSTERECTOMY    . BIOPSY  04/12/2017   Procedure: BIOPSY;  Surgeon: Rogene Houston, MD;  [facility];  Service: Endoscopy;;  ascending colon ulcer biopsies  . BREAST LUMPECTOMY  08/28/2001   left  . BREAST RECONSTRUCTION Right    implant  . CHOLECYSTECTOMY  2002  . COLONOSCOPY    . COLONOSCOPY N/A 04/12/2017   Procedure: COLONOSCOPY;  Surgeon: Rogene Houston, MD;  [facility];  Service: Endoscopy;  Laterality: N/A;  830  . MASTECTOMY  08/15/2006   right  . PARTIAL HYSTERECTOMY  1985  . REPLACEMENT TOTAL KNEE  2009, 2010   right-2010, left 2009  . TISSUE EXPANDER PLACEMENT    . TOTAL HIP ARTHROPLASTY Right 10/09/2017   Procedure: RIGHT TOTAL HIP ARTHROPLASTY ANTERIOR APPROACH;  Surgeon: Frederik Pear, MD;  Location: [facility];  Service: Orthopedics;  Laterality: Right;    There were no vitals filed for this  visit.   Subjective Assessment - 10/30/17 1117    Subjective  Ms. Caprara states that she had her THR on 10/09/2017.  She states that she is 50% better.  She has been walking around the house with her cane and has been doing her exercises.  She has had two weeks of home health; the last visit being 10/24/2017.      Pertinent History  HTN     Limitations  Standing;Walking;Lifting;House hold activities    How long can you sit comfortably?  able to sit for two hours    How long can you stand comfortably?  Able to stand for 30 minutes     How long can you walk comfortably?  Pt is walking with her cane for 10 minutes.     Patient Stated Goals  Easier to put her shoes and socks on; to sleep better; to be walking without an assistive device, complete steps in a reciprocal manner.          [facility] PT Assessment - 10/30/17 0001      Assessment   Medical Diagnosis  Rt THR    Referring Provider  Joanell Rising     Onset Date/Surgical Date  10/09/17    Next MD Visit  11/21/2016    Prior Therapy  acute and HH      Precautions   Precautions  Anterior Hip      Restrictions   Weight Bearing Restrictions  No      Balance Screen   Has the patient fallen in the past 6 months  No    Has the patient had a decrease in activity level because of a fear of falling?   Yes    Is the patient reluctant to leave their home because of a fear of falling?   No      Home Environment   Living Environment  Private residence    Type of [place] to enter    Entrance Stairs-Number of Steps  [place]  One level      Prior Function   Level of [place]  Retired    Leisure  [place]       Cognition   Overall Cognitive Status  Within Functional Limits for tasks assessed      Observation/Other Assessments   Focus on Therapeutic Outcomes (FOTO)   53      Functional Tests   Functional tests  Single leg stance;Sit to Stand      Single Leg Stance    Comments  Lt 3 seconds;RT 6"       Sit to Stand   Comments  5x 12.56      Posture/Postural Control   Posture/Postural Control  Postural limitations    Postural Limitations  Rounded Shoulders;Forward head;Increased lumbar lordosis      ROM / Strength   AROM / PROM / Strength  AROM;Strength      AROM   AROM Assessment Site  Hip    Right/Left Hip  Right    Right Hip Extension  75      Strength   Strength Assessment Site  Hip;Knee    Right/Left Hip  Right;Left    Right Hip Flexion  2/5    Right Hip Extension  3-/5    Right Hip ABduction  3+/5    Right/Left Knee  Right;Left    Right Knee Extension  5/5    Left Knee Extension  5/5      Ambulation/Gait   Ambulation Distance (Feet)  404 Feet    Assistive device  Straight cane    Gait Comments  3 minutes              Objective measurements completed on examination: See above findings.      [facility] PT Treatment/Exercise - 10/30/17 0001      Exercises   Exercises  Knee/Hip      Knee/Hip Exercises: Standing   SLS  x3 B      Knee/Hip Exercises: Seated   Sit to Sand  5 reps      Knee/Hip Exercises: Supine   Bridges  5 reps    Straight Leg Raises Limitations  bent knee raise due to unable to lift SLR B x 5       Knee/Hip Exercises: Sidelying   Hip ABduction  Strengthening;Right;5 reps             PT Education - 10/30/17 1211    Education provided  Yes    Education Details  HEP, the importance of completing balance exercise everyday     Person(s) Educated  Patient    Methods  Explanation    Comprehension  Verbalized understanding       PT Short Term Goals - 10/30/17 1224      PT SHORT TERM GOAL #1   Title  Pt to be ambulating in her home without her cane.     Time  2    Period  Weeks    Status  New      PT SHORT TERM GOAL #2   Title  PT to be able to single leg stance on both feet for 15 seconds to reduce risk of falling     Time  2    Period  Weeks    Status  New      PT SHORT TERM  GOAL #3   Title  Pt Rt LE strength to improve to allow pt to go up and down 6 steps in a reciprocal manner.     Time  2    Period  Weeks        PT Long Term Goals - 10/30/17 1219      PT LONG TERM GOAL #1   Title  Pt to pain  in her right hip to be no greater than a 1/10 to allow pt to have no difficulty getting to sleep due to hip pain.     Time  4    Period  Weeks    Status  New    Target Date  11/27/17      PT LONG TERM GOAL #2   Title  Pt to be able to single leg stance for at least 30 seconds on both LE to allow pt to feel confident walking on uneven terrain without an assistive device.     Time  4    Period  Weeks    Status  New      PT LONG TERM GOAL #3   Title  Pt strength in right hip increased one grade to allow pt to arise from a squatted position to pick items up off of the floor.    Time  4    Period  Weeks    Status  New      PT LONG TERM GOAL #4   Title  Pt to be walking in and outside the house without an assistive device.    Time  4    Period  Weeks    Status  New      PT LONG TERM GOAL #5   Title  PT to be working out at [employer] again to return to previous functional level.    Time  4    Period  Weeks    Status  New             Plan - 10/30/17 1212    Clinical Impression Statement  Ms. Montero is a 71 yo female who elected to have a THR on her right hip on 11/ 26/2018 who has been referred for outpatient physical therapy.  Evaluation demonstrates antalgic gait, decreased balance, decreased mm strength, increased pain and decreased activty tolerance.  Ms. Blades will benefit from skilled physical therapy to address these issues and maximize her functioning ability.     History and Personal Factors relevant to plan of care:  B TKR; back pain     Clinical Presentation  Stable    Clinical Decision Making  Low    Rehab Potential  Good    PT Frequency  2x / week  PT Duration  4 weeks    PT Treatment/Interventions  Therapeutic exercise;Balance  training;Gait training;Stair training;Functional mobility training;Therapeutic activities;Patient/family education    PT Next Visit Plan  begin rockerboard, forward and lateral step ups, heel raises, lunges, back extension stretch and hamstring stretch.     PT Home Exercise Plan  EVAL:  sit to stand, SLS, bent knee raise, bridge and sidelying abduction.        Patient will benefit from skilled therapeutic intervention in order to improve the following deficits and impairments:  Abnormal gait, Decreased activity tolerance, Decreased balance, Difficulty walking, Decreased strength, Pain  Visit Diagnosis: Pain in right hip - Plan: PT plan of care cert/re-cert  Difficulty in walking, not elsewhere classified - Plan: PT plan of care cert/re-cert  Muscle weakness (generalized) - Plan: PT plan of care cert/re-cert  G-Codes - 62/83/66 1222    Functional Assessment Tool Used (Outpatient Only)  foto    Functional Limitation  Mobility: Walking and moving around    Mobility: Walking and Moving Around Current Status [phone])  At least 40 percent but less than 60 percent impaired, limited or restricted    Mobility: Walking and Moving Around Goal Status [phone])  At least 40 percent but less than 60 percent impaired, limited or restricted    Mobility: Walking and Moving Around Discharge Status [phone])  At least 20 percent but less than 40 percent impaired, limited or restricted        Problem List Patient Active Problem List   Diagnosis Date Noted  . Primary osteoarthritis of right hip 10/09/2017  . Osteoarthritis of right hip 10/04/2017  . Special screening for malignant neoplasms, colon 12/30/2016  . Breast cancer ([place]) 06/27/2011  . Hx Breast cancer, Right, multifocal IDC, receptor +, Her2 - 06/07/2006    Class: Stage 1  . Hx Breast cancer (DCIS), Right, Stage 0,  08/08/2001    Rayetta Humphrey, PT CLT [phone] 10/30/2017, 12:29 PM  [facility] [address] [place], [place], [address] Phone: [phone]   Fax:  [phone]  Name: WERONIKA BIRCH MRN: [id number] Date of Birth: 1946/08/15

## 2017-10-30 NOTE — Patient Instructions (Addendum)
Bridging    Slowly raise buttocks from floor, keeping stomach tight. Repeat __10__ times per set. Do __1__ sets per session. Do __10__ sessions per day.  http://orth.exer.us/1096   Copyright  VHI. All rights reserved.  Bent Leg Lift (Hook-Lying)    Tighten stomach and slowly raise right leg _4___ inches from floor. Keep trunk rigid. Hold __5__ seconds. Repeat __10__ times per set. Do ___1_ sets per session. Do ____2 sessions per day.  http://orth.exer.us/1090   Copyright  VHI. All rights reserved.  Strengthening: Hip Abduction (Side-Lying)    Tighten muscles on front of right thigh, then lift leg _15___ inches from surface, keeping knee locked.  Repeat _10___ times per set. Do 1____ sets per session. Do __2__ sessions per day.  http://orth.exer.us/622   Copyright  VHI. All rights reserved.  Functional Quadriceps: Sit to Stand    Sit on edge of chair, feet flat on floor. Stand upright, extending knees fully. Repeat ___10_ times per set. Do ___1_ sets per session. Do 2____ sessions per day.  http://orth.exer.us/734   Copyright  VHI. All rights reserved.  Balance: Unilateral    Attempt to balance on left leg, eyes open. Hold _5_-10__ seconds. Repeat _10___ times per set. Do __1__ sets per session. Do ___2_ sessions per day. Perform exercise with eyes closed.  http://orth.exer.us/28   Copyright  VHI. All rights reserved.

## 2017-10-31 ENCOUNTER — Ambulatory Visit (HOSPITAL_COMMUNITY): Payer: PPO | Admitting: Physical Therapy

## 2017-10-31 ENCOUNTER — Encounter (HOSPITAL_COMMUNITY): Payer: Self-pay | Admitting: Physical Therapy

## 2017-10-31 DIAGNOSIS — M6281 Muscle weakness (generalized): Secondary | ICD-10-CM

## 2017-10-31 DIAGNOSIS — M25551 Pain in right hip: Secondary | ICD-10-CM | POA: Diagnosis not present

## 2017-10-31 DIAGNOSIS — R262 Difficulty in walking, not elsewhere classified: Secondary | ICD-10-CM

## 2017-10-31 NOTE — Therapy (Signed)
[facility] [place], [place], [address] Phone: [phone]   Fax:  [phone]  Physical Therapy Treatment  Patient Details  Name: Haley Peterson MRN: [id number] Date of Birth: 02-Sep-1946 Referring Provider: Joanell Rising    Encounter Date: 10/31/2017  PT End of Session - 10/31/17 1147    Visit Number  2    Number of Visits  8    Date for PT Re-Evaluation  11/29/17    Authorization Type  [employer]    Authorization - Visit Number  2    Authorization - Number of Visits  8    PT Start Time  1033    PT Stop Time  1115    PT Time Calculation (min)  42 min    Activity Tolerance  Patient tolerated treatment well       Past Medical History:  Diagnosis Date  . Arthritis   . Bone spur    left heel  . Cancer ([place])   . Dyspnea    recent last 2 months when walk to mail box- "winded" less active -no exercise 2 months   . History of breast cancer    left, right  . Hypertension   . Pneumonia 10/2012    Past Surgical History:  Procedure Laterality Date  . ABDOMINAL HYSTERECTOMY    . BIOPSY  04/12/2017   Procedure: BIOPSY;  Surgeon: Rogene Houston, MD;  [facility];  Service: Endoscopy;;  ascending colon ulcer biopsies  . BREAST LUMPECTOMY  08/28/2001   left  . BREAST RECONSTRUCTION Right    implant  . CHOLECYSTECTOMY  2002  . COLONOSCOPY    . COLONOSCOPY N/A 04/12/2017   Procedure: COLONOSCOPY;  Surgeon: Rogene Houston, MD;  [facility];  Service: Endoscopy;  Laterality: N/A;  830  . MASTECTOMY  08/15/2006   right  . PARTIAL HYSTERECTOMY  1985  . REPLACEMENT TOTAL KNEE  2009, 2010   right-2010, left 2009  . TISSUE EXPANDER PLACEMENT    . TOTAL HIP ARTHROPLASTY Right 10/09/2017   Procedure: RIGHT TOTAL HIP ARTHROPLASTY ANTERIOR APPROACH;  Surgeon: Frederik Pear, MD;  Location: [facility];  Service: Orthopedics;  Laterality: Right;    There were no vitals filed for this  visit.  Subjective Assessment - 10/31/17 1033    Subjective  Pt states that she is very sore today.    Pertinent History  HTN     Limitations  Standing;Walking;Lifting;House hold activities    How long can you sit comfortably?  able to sit for two hours    How long can you stand comfortably?  Able to stand for 30 minutes     How long can you walk comfortably?  Pt is walking with her cane for 10 minutes.     Patient Stated Goals  Easier to put her shoes and socks on; to sleep better; to be walking without an assistive device, complete steps in a reciprocal manner.     Pain Score  8     Pain Location  Hip    Pain Orientation  Right    Pain Descriptors / Indicators  Sore    Pain Type  Acute pain                      [facility] Adult PT Treatment/Exercise - 10/31/17 0001      Exercises   Exercises  Knee/Hip      Knee/Hip Exercises: Stretches  Active Hamstring Stretch  Right;3 reps;30 seconds    Passive Hamstring Stretch  Left;30 seconds    Gastroc Stretch  -- slant board 30"  3     Other Knee/Hip Stretches  Back extension x 10      Knee/Hip Exercises: Standing   Heel Raises  Both;10 reps    Forward Step Up  Both;10 reps;Hand Hold: 2;Step Height: 2"    Functional Squat  10 reps    Rocker Board  2 minutes    SLS  x4 B    Other Standing Knee Exercises  tandem stance 30" x 2             PT Education - 10/31/17 1146    Education provided  Yes    Education Details  To decrease to 5 reps of the exercises until her soreness decreases; proper body mechanics getting in and out of bed     Person(s) Educated  Patient    Methods  Explanation;Tactile cues;Verbal cues    Comprehension  Verbalized understanding;Returned demonstration       PT Short Term Goals - 10/31/17 1149      PT SHORT TERM GOAL #1   Title  Pt to be ambulating in her home without her cane.     Time  2    Period  Weeks    Status  New      PT SHORT TERM GOAL #2   Title  PT to be able to single  leg stance on both feet for 15 seconds to reduce risk of falling     Time  2    Period  Weeks    Status  New      PT SHORT TERM GOAL #3   Title  Pt Rt LE strength to improve to allow pt to go up and down 6 steps in a reciprocal manner.     Time  2    Period  Weeks        PT Long Term Goals - 10/31/17 1149      PT LONG TERM GOAL #1   Title  Pt to pain  in her right hip to be no greater than a 1/10 to allow pt to have no difficulty getting to sleep due to hip pain.     Time  4    Period  Weeks    Status  New      PT LONG TERM GOAL #2   Title  Pt to be able to single leg stance for at least 30 seconds on both LE to allow pt to feel confident walking on uneven terrain without an assistive device.     Time  4    Period  Weeks    Status  New      PT LONG TERM GOAL #3   Title  Pt strength in right hip increased one grade to allow pt to arise from a squatted position to pick items up off of the floor.    Time  4    Period  Weeks    Status  New      PT LONG TERM GOAL #4   Title  Pt to be walking in and outside the house without an assistive device.    Time  4    Period  Weeks    Status  New      PT LONG TERM GOAL #5   Title  PT to be working out at [employer] again to  return to previous functional level.    Time  4    Period  Weeks    Status  New            Plan - 10/31/17 1147    Clinical Impression Statement  Reviewed evaluation and goals with patient.  Instructed pt in proper body mechanics.  As well as the need to decrease repetition of HEP if she is too sore.  Pt instructed in new balance and strengthening exercises with attention to pt completing the exercises in proper posture.      Rehab Potential  Good    PT Frequency  2x / week    PT Duration  4 weeks    PT Treatment/Interventions  Therapeutic exercise;Balance training;Gait training;Stair training;Functional mobility training;Therapeutic activities;Patient/family education    PT Next Visit Plan  begin side  stepping activity     PT Home Exercise Plan  EVAL:  sit to stand, SLS, bent knee raise, bridge and sidelying abduction.        Patient will benefit from skilled therapeutic intervention in order to improve the following deficits and impairments:  Abnormal gait, Decreased activity tolerance, Decreased balance, Difficulty walking, Decreased strength, Pain  Visit Diagnosis: Pain in right hip  Difficulty in walking, not elsewhere classified  Muscle weakness (generalized)   G-Codes - 24-Nov-2017 1222    Functional Assessment Tool Used (Outpatient Only)  foto    Functional Limitation  Mobility: Walking and moving around    Mobility: Walking and Moving Around Current Status [phone])  At least 40 percent but less than 60 percent impaired, limited or restricted    Mobility: Walking and Moving Around Goal Status [phone])  At least 40 percent but less than 60 percent impaired, limited or restricted    Mobility: Walking and Moving Around Discharge Status [phone])  At least 20 percent but less than 40 percent impaired, limited or restricted       Problem List Patient Active Problem List   Diagnosis Date Noted  . Primary osteoarthritis of right hip 10/09/2017  . Osteoarthritis of right hip 10/04/2017  . Special screening for malignant neoplasms, colon 12/30/2016  . Breast cancer ([place]) 06/27/2011  . Hx Breast cancer, Right, multifocal IDC, receptor +, Her2 - 06/07/2006    Class: Stage 1  . Hx Breast cancer (DCIS), Right, Stage 0,  08/08/2001    Rayetta Humphrey, PT CLT [phone] 10/31/2017, 11:52 AM  [facility] [place], [place], [address] Phone: [phone]   Fax:  [phone]  Name: Haley Peterson MRN: [id number] Date of Birth: [birth year]

## 2017-11-08 ENCOUNTER — Encounter (HOSPITAL_COMMUNITY): Payer: Self-pay

## 2017-11-08 ENCOUNTER — Other Ambulatory Visit: Payer: Self-pay

## 2017-11-08 ENCOUNTER — Ambulatory Visit (HOSPITAL_COMMUNITY): Payer: PPO

## 2017-11-08 DIAGNOSIS — M25551 Pain in right hip: Secondary | ICD-10-CM | POA: Diagnosis not present

## 2017-11-08 DIAGNOSIS — R262 Difficulty in walking, not elsewhere classified: Secondary | ICD-10-CM

## 2017-11-08 DIAGNOSIS — M6281 Muscle weakness (generalized): Secondary | ICD-10-CM

## 2017-11-08 NOTE — Therapy (Signed)
[place], [place], [address] Phone: [phone]   Fax:  [phone]  Physical Therapy Treatment  Patient Details  Name: Haley Peterson MRN: [id number] Date of Birth: July 12, 1946 Referring Provider: Joanell Rising    Encounter Date: 11/08/2017  PT End of Session - 11/08/17 1724    Visit Number  3    Number of Visits  8    Date for PT Re-Evaluation  11/29/17    Authorization Type  [employer]    Authorization - Visit Number  3    Authorization - Number of Visits  8    PT Start Time  6629    PT Stop Time  1731    PT Time Calculation (min)  41 min    Activity Tolerance  Patient tolerated treatment well    Behavior During Therapy  [facility] for tasks assessed/performed       Past Medical History:  Diagnosis Date  . Arthritis   . Bone spur    left heel  . Cancer ([place])   . Dyspnea    recent last 2 months when walk to mail box- "winded" less active -no exercise 2 months   . History of breast cancer    left, right  . Hypertension   . Pneumonia 10/2012    Past Surgical History:  Procedure Laterality Date  . ABDOMINAL HYSTERECTOMY    . BIOPSY  04/12/2017   Procedure: BIOPSY;  Surgeon: Rogene Houston, MD;  [facility];  Service: Endoscopy;;  ascending colon ulcer biopsies  . BREAST LUMPECTOMY  08/28/2001   left  . BREAST RECONSTRUCTION Right    implant  . CHOLECYSTECTOMY  2002  . COLONOSCOPY    . COLONOSCOPY N/A 04/12/2017   Procedure: COLONOSCOPY;  Surgeon: Rogene Houston, MD;  [facility];  Service: Endoscopy;  Laterality: N/A;  830  . MASTECTOMY  08/15/2006   right  . PARTIAL HYSTERECTOMY  1985  . REPLACEMENT TOTAL KNEE  2009, 2010   right-2010, left 2009  . TISSUE EXPANDER PLACEMENT    . TOTAL HIP ARTHROPLASTY Right 10/09/2017   Procedure: RIGHT TOTAL HIP ARTHROPLASTY ANTERIOR APPROACH;  Surgeon: Frederik Pear, MD;  Location: [place];  Service: Orthopedics;  Laterality:  Right;    There were no vitals filed for this visit.  Subjective Assessment - 11/08/17 1652    Subjective  She feels good today. She reports she drove to [place] for Christmas yesterday and had no increase in pain. She states she has been performing her HEP with no major increasei n soreness and feels it is going well.    Pertinent History  HTN     Limitations  Standing;Walking;Lifting;House hold activities    Patient Stated Goals  Easier to put her shoes and socks on; to sleep better; to be walking without an assistive device, complete steps in a reciprocal manner.     Currently in Pain?  Yes    Pain Score  5     Pain Location  Hip    Pain Orientation  Right    Pain Descriptors / Indicators  Aching;Sore    Pain Type  Surgical pain    Pain Onset  More than a month ago      [facility] Adult PT Treatment/Exercise - 11/08/17 0001      Knee/Hip Exercises: Stretches   Active Hamstring Stretch  Right;30 seconds;2 reps    Gastroc Stretch  Both;Limitations;2 reps;30 seconds  Gastroc Stretch Limitations  slant board      Knee/Hip Exercises: Standing   Heel Raises  Both;20 reps    Lateral Step Up  15 reps;Both;Hand Hold: 0;Step Height: 2"    Forward Step Up  Step Height: 4";Hand Hold: 0;15 reps;Both    Step Down  Both;15 reps;Hand Hold: 1;Step Height: 2"    Rocker Board  2 minutes;Limitations 2x 1 minute    Rocker Board Limitations  lateral, fingertip touch for balance & A/P with 2 hand hold    Other Standing Knee Exercises  side stepping with red theraband, 2x 15' both directions       Balance Exercises - 11/08/17 1724      Balance Exercises: Standing   Tandem Stance  Foam/compliant surface;4 reps;30 secs modified tandem; alternated foot position        PT Education - 11/08/17 1658    Education provided  Yes    Education Details  educated on proper form/technique with exercises.    Person(s) Educated  Patient    Methods  Explanation    Comprehension  Verbalized  understanding;Returned demonstration       PT Short Term Goals - 10/31/17 1149      PT SHORT TERM GOAL #1   Title  Pt to be ambulating in her home without her cane.     Time  2    Period  Weeks    Status  New      PT SHORT TERM GOAL #2   Title  PT to be able to single leg stance on both feet for 15 seconds to reduce risk of falling     Time  2    Period  Weeks    Status  New      PT SHORT TERM GOAL #3   Title  Pt Rt LE strength to improve to allow pt to go up and down 6 steps in a reciprocal manner.     Time  2    Period  Weeks        PT Long Term Goals - 10/31/17 1149      PT LONG TERM GOAL #1   Title  Pt to pain  in her right hip to be no greater than a 1/10 to allow pt to have no difficulty getting to sleep due to hip pain.     Time  4    Period  Weeks    Status  New      PT LONG TERM GOAL #2   Title  Pt to be able to single leg stance for at least 30 seconds on both LE to allow pt to feel confident walking on uneven terrain without an assistive device.     Time  4    Period  Weeks    Status  New      PT LONG TERM GOAL #3   Title  Pt strength in right hip increased one grade to allow pt to arise from a squatted position to pick items up off of the floor.    Time  4    Period  Weeks    Status  New      PT LONG TERM GOAL #4   Title  Pt to be walking in and outside the house without an assistive device.    Time  4    Period  Weeks    Status  New      PT LONG TERM GOAL #5   Title  PT to be working out at the gym again to return to previous functional level.    Time  4    Period  Weeks    Status  New        Plan - 11/08/17 1726    Clinical Impression Statement  Patient is progressing well in therapy and has a positive attitude throughout session. She reported compliance and decreased soreness from HEP and feels comfortable with her current exercises. She was able to advance balance activities/functional strengthening today with forward and lateral step  ups/downs and side stepping with a theraband. Requires minimal cuing to maintain proper form with side stepping and I anticipate next session she will be ready to add this exercise to her HEP. She will continue to benefit from skilled PT services to address current impairments and progress towards goals to improve mobility and QOL.     Rehab Potential  Good    PT Frequency  2x / week    PT Duration  4 weeks    PT Treatment/Interventions  Therapeutic exercise;Balance training;Gait training;Stair training;Functional mobility training;Therapeutic activities;Patient/family education    PT Next Visit Plan  Continue with side stepping, functinoal squat, steps ups, and balance activities: lat/fwd step over low hurdles, tandem/SLS    PT Home Exercise Plan  EVAL:  sit to stand, SLS, bent knee raise, bridge and sidelying abduction.     Consulted and Agree with Plan of Care  Patient       Patient will benefit from skilled therapeutic intervention in order to improve the following deficits and impairments:  Abnormal gait, Decreased activity tolerance, Decreased balance, Difficulty walking, Decreased strength, Pain  Visit Diagnosis: Pain in right hip  Difficulty in walking, not elsewhere classified  Muscle weakness (generalized)     Problem List Patient Active Problem List   Diagnosis Date Noted  . Primary osteoarthritis of right hip 10/09/2017  . Osteoarthritis of right hip 10/04/2017  . Special screening for malignant neoplasms, colon 12/30/2016  . Breast cancer ([place]) 06/27/2011  . Hx Breast cancer, Right, multifocal IDC, receptor +, Her2 - 06/07/2006    Class: Stage 1  . Hx Breast cancer (DCIS), Right, Stage 0,  08/08/2001     Kipp Brood, PT, DPT Physical Therapist with [facility]  11/08/2017 5:46 PM    [place] [address] [place], [place], [address] Phone: [phone]   Fax:  [phone]  Name: ESRA FRANKOWSKI MRN: [id number] Date of Birth: 1946-02-21

## 2017-11-09 ENCOUNTER — Encounter (HOSPITAL_COMMUNITY): Payer: Self-pay

## 2017-11-09 ENCOUNTER — Ambulatory Visit (HOSPITAL_COMMUNITY): Payer: PPO

## 2017-11-09 DIAGNOSIS — M6281 Muscle weakness (generalized): Secondary | ICD-10-CM

## 2017-11-09 DIAGNOSIS — M25551 Pain in right hip: Secondary | ICD-10-CM | POA: Diagnosis not present

## 2017-11-09 DIAGNOSIS — R262 Difficulty in walking, not elsewhere classified: Secondary | ICD-10-CM

## 2017-11-09 NOTE — Therapy (Signed)
[place], [place], [address] Phone: [phone]   Fax:  [phone]  Physical Therapy Treatment  Patient Details  Name: Haley Peterson MRN: [id number] Date of Birth: [birth year] Referring Provider: Joanell Rising    Encounter Date: 11/09/2017  PT End of Session - 11/09/17 1432    Visit Number  4    Number of Visits  8    Date for PT Re-Evaluation  11/29/17    Authorization Type  [employer]    Authorization - Visit Number  4    Authorization - Number of Visits  8    PT Start Time  4765    PT Stop Time  1520    PT Time Calculation (min)  46 min    Equipment Utilized During Treatment  Gait belt    Activity Tolerance  Patient tolerated treatment well    Behavior During Therapy  [facility] for tasks assessed/performed       Past Medical History:  Diagnosis Date  . Arthritis   . Bone spur    left heel  . Cancer ([place])   . Dyspnea    recent last 2 months when walk to mail box- "winded" less active -no exercise 2 months   . History of breast cancer    left, right  . Hypertension   . Pneumonia 10/2012    Past Surgical History:  Procedure Laterality Date  . ABDOMINAL HYSTERECTOMY    . BIOPSY  04/12/2017   Procedure: BIOPSY;  Surgeon: Rogene Houston, MD;  [facility];  Service: Endoscopy;;  ascending colon ulcer biopsies  . BREAST LUMPECTOMY  08/28/2001   left  . BREAST RECONSTRUCTION Right    implant  . CHOLECYSTECTOMY  2002  . COLONOSCOPY    . COLONOSCOPY N/A 04/12/2017   Procedure: COLONOSCOPY;  Surgeon: Rogene Houston, MD;  [facility];  Service: Endoscopy;  Laterality: N/A;  830  . MASTECTOMY  08/15/2006   right  . PARTIAL HYSTERECTOMY  1985  . REPLACEMENT TOTAL KNEE  2009, 2010   right-2010, left 2009  . TISSUE EXPANDER PLACEMENT    . TOTAL HIP ARTHROPLASTY Right 10/09/2017   Procedure: RIGHT TOTAL HIP ARTHROPLASTY ANTERIOR APPROACH;  Surgeon: Frederik Pear, MD;   Location: [facility];  Service: Orthopedics;  Laterality: Right;    There were no vitals filed for this visit.  Subjective Assessment - 11/09/17 1432    Subjective  Pt stated she is doing good, a little soreness from new activities but no pain.  Reports compliance with HEP daily.  Stated most difficulty currently putting on socks and shoes.   Pt reports she drove to [place] for Christmas, was a little tired but no pain.  Pt arrived ambulating no AD, no reoprts of recent falls or LOB episodes.      Patient Stated Goals  Easier to put her shoes and socks on; to sleep better; to be walking without an assistive device, complete steps in a reciprocal manner.     Currently in Pain?  No/denies                      [facility] Adult PT Treatment/Exercise - 11/09/17 0001      Knee/Hip Exercises: Standing   Heel Raises  Both;20 reps Toe raises 20x    Lateral Step Up  15 reps;Hand Hold: 1;Step Height: 4"    Forward Step Up  Right;20 reps;Hand Hold: 1;Step Height:  4"    Functional Squat  15 reps front of chair for mechanics    Rocker Board  2 minutes;Limitations    Rocker Board Limitations  A/P and lateral 1 intermittent HHA    SLS  Rt 5" Lt 5" max of 5    Other Standing Knee Exercises  sidestep 2RT RTB    Other Standing Knee Exercises  forward and lateral over 6 in 2RT each; 3D hip excursion for mobility          Balance Exercises - 11/09/17 1514      Balance Exercises: Standing   Tandem Stance  Eyes open;Foam/compliant surface;3 reps;30 secs        PT Education - 11/08/17 1658    Education provided  Yes    Education Details  educated on proper form/technique with exercises.    Person(s) Educated  Patient    Methods  Explanation    Comprehension  Verbalized understanding;Returned demonstration       PT Short Term Goals - 10/31/17 1149      PT SHORT TERM GOAL #1   Title  Pt to be ambulating in her home without her cane.     Time  2    Period  Weeks    Status  New       PT SHORT TERM GOAL #2   Title  PT to be able to single leg stance on both feet for 15 seconds to reduce risk of falling     Time  2    Period  Weeks    Status  New      PT SHORT TERM GOAL #3   Title  Pt Rt LE strength to improve to allow pt to go up and down 6 steps in a reciprocal manner.     Time  2    Period  Weeks        PT Long Term Goals - 10/31/17 1149      PT LONG TERM GOAL #1   Title  Pt to pain  in her right hip to be no greater than a 1/10 to allow pt to have no difficulty getting to sleep due to hip pain.     Time  4    Period  Weeks    Status  New      PT LONG TERM GOAL #2   Title  Pt to be able to single leg stance for at least 30 seconds on both LE to allow pt to feel confident walking on uneven terrain without an assistive device.     Time  4    Period  Weeks    Status  New      PT LONG TERM GOAL #3   Title  Pt strength in right hip increased one grade to allow pt to arise from a squatted position to pick items up off of the floor.    Time  4    Period  Weeks    Status  New      PT LONG TERM GOAL #4   Title  Pt to be walking in and outside the house without an assistive device.    Time  4    Period  Weeks    Status  New      PT LONG TERM GOAL #5   Title  PT to be working out at [employer] again to return to previous functional level.    Time  4    Period  Weeks    Status  New            Plan - 11/09/17 1531    Clinical Impression Statement  Pt is progressing well towards goals with therapy.  Pt arrived without AD and reports compliance with HEP daily.  Session focus on functional strengthening and additional balance training.  Added hurdles to improve SLS and hip strengthening as well as 3D hip excursion to improve hip mobility to assist with putting on socks/shoes.  Pt able to complete all exercises with min cueing to reduce compensation and proper form.  Most difficulty with balance activities, added tandem stance to HEP to improve balance.       Rehab Potential  Good    PT Frequency  2x / week    PT Duration  4 weeks    PT Treatment/Interventions  Therapeutic exercise;Balance training;Gait training;Stair training;Functional mobility training;Therapeutic activities;Patient/family education    PT Next Visit Plan  Add cone tapping next session.  Continue with side stepping, functinoal squat, steps ups, and balance activities: lat/fwd step over low hurdles, tandem/SLS    PT Home Exercise Plan  EVAL:  sit to stand, SLS, bent knee raise, bridge and sidelying abduction.        Patient will benefit from skilled therapeutic intervention in order to improve the following deficits and impairments:  Abnormal gait, Decreased activity tolerance, Decreased balance, Difficulty walking, Decreased strength, Pain  Visit Diagnosis: Pain in right hip  Difficulty in walking, not elsewhere classified  Muscle weakness (generalized)     Problem List Patient Active Problem List   Diagnosis Date Noted  . Primary osteoarthritis of right hip 10/09/2017  . Osteoarthritis of right hip 10/04/2017  . Special screening for malignant neoplasms, colon 12/30/2016  . Breast cancer ([place]) 06/27/2011  . Hx Breast cancer, Right, multifocal IDC, receptor +, Her2 - 06/07/2006    Class: Stage 1  . Hx Breast cancer (DCIS), Right, Stage 0,  08/08/2001   Ihor Austin, LPTA; CBIS [phone]  Aldona Lento 11/09/2017, 4:10 PM  [place], [place], [address] Phone: [phone]   Fax:  [phone]  Name: VALLEY KE MRN: [id number] Date of Birth: 1946/06/18

## 2017-11-09 NOTE — Patient Instructions (Signed)
Tandem Stance    Right foot in front of left, heel touching toe both feet "straight ahead". Stand on Foot Triangle of Support with both feet. Balance in this position 30 seconds. Do with left foot in front of right.  Copyright  VHI. All rights reserved.   

## 2017-11-15 ENCOUNTER — Encounter (HOSPITAL_COMMUNITY): Payer: Self-pay | Admitting: Physical Therapy

## 2017-11-15 ENCOUNTER — Ambulatory Visit (HOSPITAL_COMMUNITY): Payer: PPO | Attending: Orthopedic Surgery | Admitting: Physical Therapy

## 2017-11-15 DIAGNOSIS — M25551 Pain in right hip: Secondary | ICD-10-CM | POA: Insufficient documentation

## 2017-11-15 DIAGNOSIS — R262 Difficulty in walking, not elsewhere classified: Secondary | ICD-10-CM | POA: Insufficient documentation

## 2017-11-15 DIAGNOSIS — M6281 Muscle weakness (generalized): Secondary | ICD-10-CM | POA: Insufficient documentation

## 2017-11-15 NOTE — Therapy (Signed)
[facility] [address] [place], [place], [address] Phone: [phone]   Fax:  [phone]  Physical Therapy Treatment  Patient Details  Name: Haley Peterson MRN: [id number] Date of Birth: 1945-12-17 Referring Provider: Joanell Rising    Encounter Date: 11/15/2017  PT End of Session - 11/15/17 1222    Visit Number  5    Number of Visits  8    Date for PT Re-Evaluation  11/29/17    Authorization Type  [employer]    Authorization - Visit Number  5    Authorization - Number of Visits  8    PT Start Time  0910    PT Stop Time  0952    PT Time Calculation (min)  42 min    Equipment Utilized During Treatment  Gait belt    Activity Tolerance  Patient tolerated treatment well    Behavior During Therapy  [facility] for tasks assessed/performed       Past Medical History:  Diagnosis Date  . Arthritis   . Bone spur    left heel  . Cancer ([place])   . Dyspnea    recent last 2 months when walk to mail box- "winded" less active -no exercise 2 months   . History of breast cancer    left, right  . Hypertension   . Pneumonia 10/2012    Past Surgical History:  Procedure Laterality Date  . ABDOMINAL HYSTERECTOMY    . BIOPSY  04/12/2017   Procedure: BIOPSY;  Surgeon: Rogene Houston, MD;  [facility];  Service: Endoscopy;;  ascending colon ulcer biopsies  . BREAST LUMPECTOMY  08/28/2001   left  . BREAST RECONSTRUCTION Right    implant  . CHOLECYSTECTOMY  2002  . COLONOSCOPY    . COLONOSCOPY N/A 04/12/2017   Procedure: COLONOSCOPY;  Surgeon: Rogene Houston, MD;  [facility];  Service: Endoscopy;  Laterality: N/A;  830  . MASTECTOMY  08/15/2006   right  . PARTIAL HYSTERECTOMY  1985  . REPLACEMENT TOTAL KNEE  2009, 2010   right-2010, left 2009  . TISSUE EXPANDER PLACEMENT    . TOTAL HIP ARTHROPLASTY Right 10/09/2017   Procedure: RIGHT TOTAL HIP ARTHROPLASTY ANTERIOR APPROACH;  Surgeon: Frederik Pear, MD;  Location:  [place];  Service: Orthopedics;  Laterality: Right;    There were no vitals filed for this visit.  Subjective Assessment - 11/15/17 0912    Subjective  Pt states that her knee is bothering her more than her hip.   States that it is easier for her to put her shoes and socks on.    Pertinent History  HTN     Limitations  Standing;Walking;Lifting;House hold activities    Patient Stated Goals  Easier to put her shoes and socks on; to sleep better; to be walking without an assistive device, complete steps in a reciprocal manner.     Currently in Pain?  Yes    Pain Score  5     Pain Location  Hip    Pain Orientation  Right    Pain Descriptors / Indicators  Aching    Pain Type  Acute pain    Pain Onset  More than a month ago    Pain Frequency  Intermittent    Aggravating Factors   weight bearing activity    Pain Relieving Factors  rest     Multiple Pain Sites  Yes    Pain Score  8  Pain Location  Knee    Pain Orientation  Right    Pain Descriptors / Indicators  Sore    Pain Type  Acute pain    Pain Onset  1 to 4 weeks ago    Aggravating Factors   activity     Pain Relieving Factors  rest                      [facility] Adult PT Treatment/Exercise - 11/15/17 0001      Knee/Hip Exercises: Stretches   Passive Hamstring Stretch  Left;30 seconds    Gastroc Stretch  Both;Limitations;2 reps;30 seconds    Gastroc Stretch Limitations  slant board      Knee/Hip Exercises: Standing   Heel Raises  Both;15 reps Toe raises 20x    Forward Lunges  Both;10 reps onto step     Lateral Step Up  15 reps;Hand Hold: 1;Step Height: 4"    Forward Step Up  Right;15 reps;Hand Hold: 1;Step Height: 4"    Functional Squat  15 reps    Rocker Board  2 minutes;Limitations    Rocker Board Limitations  A/P and lateral 1 intermittent HHA    SLS  Rt 10" Lt 10 max of 5 Tandem stance x 3    Other Standing Knee Exercises  sidestep 2RT RTB    Other Standing Knee Exercises   each; 3D hip excursion for  mobility      Knee/Hip Exercises: Seated   Sit to Sand  10 reps               PT Short Term Goals - 10/31/17 1149      PT SHORT TERM GOAL #1   Title  Pt to be ambulating in her home without her cane.     Time  2    Period  Weeks    Status  New      PT SHORT TERM GOAL #2   Title  PT to be able to single leg stance on both feet for 15 seconds to reduce risk of falling     Time  2    Period  Weeks    Status  New      PT SHORT TERM GOAL #3   Title  Pt Rt LE strength to improve to allow pt to go up and down 6 steps in a reciprocal manner.     Time  2    Period  Weeks        PT Long Term Goals - 10/31/17 1149      PT LONG TERM GOAL #1   Title  Pt to pain  in her right hip to be no greater than a 1/10 to allow pt to have no difficulty getting to sleep due to hip pain.     Time  4    Period  Weeks    Status  New      PT LONG TERM GOAL #2   Title  Pt to be able to single leg stance for at least 30 seconds on both LE to allow pt to feel confident walking on uneven terrain without an assistive device.     Time  4    Period  Weeks    Status  New      PT LONG TERM GOAL #3   Title  Pt strength in right hip increased one grade to allow pt to arise from a squatted position to pick items up off of  the floor.    Time  4    Period  Weeks    Status  New      PT LONG TERM GOAL #4   Title  Pt to be walking in and outside the house without an assistive device.    Time  4    Period  Weeks    Status  New      PT LONG TERM GOAL #5   Title  PT to be working out at [employer] again to return to previous functional level.    Time  4    Period  Weeks    Status  New            Plan - 11/15/17 1223    Clinical Impression Statement  Pt able to complete treatment without complaint of increased knee pain.  Worked on balance with tandem stance.  Pt continues to have weak hip abductors and extensors but is improving.  Better technique with exercises today     Rehab Potential   Good    PT Frequency  2x / week    PT Duration  4 weeks    PT Treatment/Interventions  Therapeutic exercise;Balance training;Gait training;Stair training;Functional mobility training;Therapeutic activities;Patient/family education    PT Next Visit Plan  Add cone tapping next session.   balance activities: lat/fwd step over low hurdles,     PT Home Exercise Plan  EVAL:  sit to stand, SLS, bent knee raise, bridge and sidelying abduction.        Patient will benefit from skilled therapeutic intervention in order to improve the following deficits and impairments:  Abnormal gait, Decreased activity tolerance, Decreased balance, Difficulty walking, Decreased strength, Pain  Visit Diagnosis: Pain in right hip  Difficulty in walking, not elsewhere classified  Muscle weakness (generalized)     Problem List Patient Active Problem List   Diagnosis Date Noted  . Primary osteoarthritis of right hip 10/09/2017  . Osteoarthritis of right hip 10/04/2017  . Special screening for malignant neoplasms, colon 12/30/2016  . Breast cancer ([place]) 06/27/2011  . Hx Breast cancer, Right, multifocal IDC, receptor +, Her2 - 06/07/2006    Class: Stage 1  . Hx Breast cancer (DCIS), Right, Stage 0,  08/08/2001    Rayetta Humphrey, PT CLT [phone] 11/15/2017, 12:26 PM  [place] [address] [place], [place], [address] Phone: [phone]   Fax:  [phone]  Name: LENNIS RADER MRN: [id number] Date of Birth: [birth year]

## 2017-11-17 ENCOUNTER — Encounter (HOSPITAL_COMMUNITY): Payer: Self-pay

## 2017-11-17 ENCOUNTER — Ambulatory Visit (HOSPITAL_COMMUNITY): Payer: PPO

## 2017-11-17 DIAGNOSIS — M25551 Pain in right hip: Secondary | ICD-10-CM | POA: Diagnosis not present

## 2017-11-17 DIAGNOSIS — R262 Difficulty in walking, not elsewhere classified: Secondary | ICD-10-CM

## 2017-11-17 DIAGNOSIS — M6281 Muscle weakness (generalized): Secondary | ICD-10-CM

## 2017-11-17 NOTE — Therapy (Signed)
[facility] [address] [place], [place], [address] Phone: [phone]   Fax:  [phone]  Physical Therapy Treatment  Patient Details  Name: Haley Peterson MRN: [id number] Date of Birth: July 18, 1946 Referring Provider: Joanell Rising    Encounter Date: 11/17/2017  PT End of Session - 11/17/17 1443    Visit Number  6    Number of Visits  8    Date for PT Re-Evaluation  11/29/17    Authorization Type  [employer]    Authorization - Visit Number  6    Authorization - Number of Visits  8    PT Start Time  9024    PT Stop Time  1518    PT Time Calculation (min)  40 min    Equipment Utilized During Treatment  Gait belt    Activity Tolerance  Patient tolerated treatment well    Behavior During Therapy  [facility] for tasks assessed/performed       Past Medical History:  Diagnosis Date  . Arthritis   . Bone spur    left heel  . Cancer ([place])   . Dyspnea    recent last 2 months when walk to mail box- "winded" less active -no exercise 2 months   . History of breast cancer    left, right  . Hypertension   . Pneumonia 10/2012    Past Surgical History:  Procedure Laterality Date  . ABDOMINAL HYSTERECTOMY    . BIOPSY  04/12/2017   Procedure: BIOPSY;  Surgeon: Rogene Houston, MD;  [facility];  Service: Endoscopy;;  ascending colon ulcer biopsies  . BREAST LUMPECTOMY  08/28/2001   left  . BREAST RECONSTRUCTION Right    implant  . CHOLECYSTECTOMY  2002  . COLONOSCOPY    . COLONOSCOPY N/A 04/12/2017   Procedure: COLONOSCOPY;  Surgeon: Rogene Houston, MD;  [facility];  Service: Endoscopy;  Laterality: N/A;  830  . MASTECTOMY  08/15/2006   right  . PARTIAL HYSTERECTOMY  1985  . REPLACEMENT TOTAL KNEE  2009, 2010   right-2010, left 2009  . TISSUE EXPANDER PLACEMENT    . TOTAL HIP ARTHROPLASTY Right 10/09/2017   Procedure: RIGHT TOTAL HIP ARTHROPLASTY ANTERIOR APPROACH;  Surgeon: Frederik Pear, MD;  Location:  [facility];  Service: Orthopedics;  Laterality: Right;    There were no vitals filed for this visit.  Subjective Assessment - 11/17/17 1436    Subjective  Pt stated arthritis in bothering her back, hip is feeling okay.  Pain scale 6/10 soreness in back and 3/10 for Rt hip.    Pertinent History  HTN     Patient Stated Goals  Easier to put her shoes and socks on; to sleep better; to be walking without an assistive device, complete steps in a reciprocal manner.     Currently in Pain?  Yes    Pain Score  3     Pain Location  Hip    Pain Orientation  Right    Pain Descriptors / Indicators  Sore    Pain Type  Acute pain    Pain Onset  More than a month ago    Pain Frequency  Intermittent    Aggravating Factors   weight bearing activity    Pain Relieving Factors  rest    Pain Score  6    Pain Location  Back    Pain Orientation  Lower    Pain Descriptors / Indicators  Sore    Pain Type  Acute pain    Pain Onset  1 to 4 weeks ago    Pain Frequency  Intermittent    Aggravating Factors   activity, walking    Pain Relieving Factors  rest                      [facility] Adult PT Treatment/Exercise - 11/17/17 0001      Knee/Hip Exercises: Standing   Heel Raises  Both;20 reps incline slope    Lateral Step Up  15 reps;Hand Hold: 1;Step Height: 4"    Forward Step Up  Right;15 reps;Hand Hold: 1;Step Height: 4"    Functional Squat  15 reps    Rocker Board  2 minutes;Limitations    Rocker Board Limitations  A/P and lateral 1 intermittent HHA    SLS  Rt 10", Lt 6" max of 5    Other Standing Knee Exercises  cone taps 5x each LE; sidestep 2RT RTB    Other Standing Knee Exercises  fwd/lateral step over 10x each          Balance Exercises - 11/17/17 1518      Balance Exercises: Standing   Tandem Stance  Eyes open;Foam/compliant surface;3 reps;30 secs    SLS  5 reps    Step Over Hurdles / Cones  fwd/lat 6in 10x each    Other Standing Exercises  cone taps 5x BLE          PT  Short Term Goals - 10/31/17 1149      PT SHORT TERM GOAL #1   Title  Pt to be ambulating in her home without her cane.     Time  2    Period  Weeks    Status  New      PT SHORT TERM GOAL #2   Title  PT to be able to single leg stance on both feet for 15 seconds to reduce risk of falling     Time  2    Period  Weeks    Status  New      PT SHORT TERM GOAL #3   Title  Pt Rt LE strength to improve to allow pt to go up and down 6 steps in a reciprocal manner.     Time  2    Period  Weeks        PT Long Term Goals - 10/31/17 1149      PT LONG TERM GOAL #1   Title  Pt to pain  in her right hip to be no greater than a 1/10 to allow pt to have no difficulty getting to sleep due to hip pain.     Time  4    Period  Weeks    Status  New      PT LONG TERM GOAL #2   Title  Pt to be able to single leg stance for at least 30 seconds on both LE to allow pt to feel confident walking on uneven terrain without an assistive device.     Time  4    Period  Weeks    Status  New      PT LONG TERM GOAL #3   Title  Pt strength in right hip increased one grade to allow pt to arise from a squatted position to pick items up off of the floor.    Time  4    Period  Weeks  Status  New      PT LONG TERM GOAL #4   Title  Pt to be walking in and outside the house without an assistive device.    Time  4    Period  Weeks    Status  New      PT LONG TERM GOAL #5   Title  PT to be working out at [employer] again to return to previous functional level.    Time  4    Period  Weeks    Status  New            Plan - 11/17/17 1511    Clinical Impression Statement  Session focus with functional strengthening and balance training.  Pt continues to exhibit weak hip musculature and difficulty wiht single leg balance activities.  Added cone taps and hurdles to improve single leg stance wiht noted LOB/intermittent HHA required.  No reports of increased pain through session, was limited by fatigue.    Rehab  Potential  Good    PT Frequency  2x / week    PT Duration  4 weeks    PT Treatment/Interventions  Therapeutic exercise;Balance training;Gait training;Stair training;Functional mobility training;Therapeutic activities;Patient/family education    PT Next Visit Plan  Begin vector stance next session.  Continue functional strengthening and balance training.      PT Home Exercise Plan  EVAL:  sit to stand, SLS, bent knee raise, bridge and sidelying abduction.        Patient will benefit from skilled therapeutic intervention in order to improve the following deficits and impairments:  Abnormal gait, Decreased activity tolerance, Decreased balance, Difficulty walking, Decreased strength, Pain  Visit Diagnosis: Pain in right hip  Difficulty in walking, not elsewhere classified  Muscle weakness (generalized)     Problem List Patient Active Problem List   Diagnosis Date Noted  . Primary osteoarthritis of right hip 10/09/2017  . Osteoarthritis of right hip 10/04/2017  . Special screening for malignant neoplasms, colon 12/30/2016  . Breast cancer ([place]) 06/27/2011  . Hx Breast cancer, Right, multifocal IDC, receptor +, Her2 - 06/07/2006    Class: Stage 1  . Hx Breast cancer ([facility]IS), Right, Stage 0,  08/08/2001   Ihor Austin, LPTA; [facility] [phone]  Aldona Lento 11/17/2017, 3:20 PM  [facility] [place], [place], [address] Phone: [phone]   Fax:  [phone]  Name: Haley Peterson MRN: [id number] Date of Birth: 12/09/1945

## 2017-11-20 ENCOUNTER — Ambulatory Visit (HOSPITAL_COMMUNITY): Payer: PPO | Admitting: Physical Therapy

## 2017-11-20 DIAGNOSIS — M25551 Pain in right hip: Secondary | ICD-10-CM | POA: Diagnosis not present

## 2017-11-20 DIAGNOSIS — R262 Difficulty in walking, not elsewhere classified: Secondary | ICD-10-CM

## 2017-11-20 DIAGNOSIS — M6281 Muscle weakness (generalized): Secondary | ICD-10-CM

## 2017-11-20 NOTE — Therapy (Signed)
[facility] [address] [place], [place], [address] Phone: [phone]   Fax:  [phone]  Physical Therapy Treatment  Patient Details  Name: Haley Peterson MRN: [id number] Date of Birth: 03-22-1946 Referring Provider: Joanell Rising    Encounter Date: 11/20/2017  PT End of Session - 11/20/17 1120    Visit Number  7    Number of Visits  8    Date for PT Re-Evaluation  11/29/17    Authorization Type  [employer]    Authorization - Visit Number  7    Authorization - Number of Visits  8    PT Start Time  1038    PT Stop Time  1120    PT Time Calculation (min)  42 min    Equipment Utilized During Treatment  Gait belt    Activity Tolerance  Patient tolerated treatment well    Behavior During Therapy  [facility] for tasks assessed/performed       Past Medical History:  Diagnosis Date  . Arthritis   . Bone spur    left heel  . Cancer ([place])   . Dyspnea    recent last 2 months when walk to mail box- "winded" less active -no exercise 2 months   . History of breast cancer    left, right  . Hypertension   . Pneumonia 10/2012    Past Surgical History:  Procedure Laterality Date  . ABDOMINAL HYSTERECTOMY    . BIOPSY  04/12/2017   Procedure: BIOPSY;  Surgeon: Rogene Houston, MD;  [facility];  Service: Endoscopy;;  ascending colon ulcer biopsies  . BREAST LUMPECTOMY  08/28/2001   left  . BREAST RECONSTRUCTION Right    implant  . CHOLECYSTECTOMY  2002  . COLONOSCOPY    . COLONOSCOPY N/A 04/12/2017   Procedure: COLONOSCOPY;  Surgeon: Rogene Houston, MD;  [facility];  Service: Endoscopy;  Laterality: N/A;  830  . MASTECTOMY  08/15/2006   right  . PARTIAL HYSTERECTOMY  1985  . REPLACEMENT TOTAL KNEE  2009, 2010   right-2010, left 2009  . TISSUE EXPANDER PLACEMENT    . TOTAL HIP ARTHROPLASTY Right 10/09/2017   Procedure: RIGHT TOTAL HIP ARTHROPLASTY ANTERIOR APPROACH;  Surgeon: Frederik Pear, MD;  Location:  [facility];  Service: Orthopedics;  Laterality: Right;    There were no vitals filed for this visit.  Subjective Assessment - 11/20/17 1039    Subjective  Pt states that she went to the grocery store yesterday and is sore.     Pertinent History  HTN     Patient Stated Goals  Easier to put her shoes and socks on; to sleep better; to be walking without an assistive device, complete steps in a reciprocal manner.     Currently in Pain?  Yes    Pain Score  4     Pain Location  Hip    Pain Orientation  Right    Pain Descriptors / Indicators  Sore    Pain Onset  More than a month ago    Pain Onset  1 to 4 weeks ago                      [facility] Adult PT Treatment/Exercise - 11/20/17 0001      Knee/Hip Exercises: Stretches   Passive Hamstring Stretch  Left;30 seconds    Gastroc Stretch  Both;Limitations;2 reps;30 seconds    Gastroc Stretch Limitations  slant board    Other Knee/Hip Stretches  Back extension x 10      Knee/Hip Exercises: Standing   Heel Raises  Right;15 reps incline slope    Forward Lunges  Both;10 reps onto step     Lateral Step Up  15 reps;Hand Hold: 1;Step Height: 6"    Forward Step Up  Right;15 reps;Hand Hold: 1;Step Height: 4"    Functional Squat  15 reps    Rocker Board  2 minutes;Limitations    Rocker Board Limitations  A/P and lateral 1 intermittent HHA    SLS  Rt 10", Lt 6" max of 5    Other Standing Knee Exercises  sidestep 2RT RTB      Knee/Hip Exercises: Seated   Sit to Sand  15 reps               PT Short Term Goals - 10/31/17 1149      PT SHORT TERM GOAL #1   Title  Pt to be ambulating in her home without her cane.     Time  2    Period  Weeks    Status  New      PT SHORT TERM GOAL #2   Title  PT to be able to single leg stance on both feet for 15 seconds to reduce risk of falling     Time  2    Period  Weeks    Status  New      PT SHORT TERM GOAL #3   Title  Pt Rt LE strength to improve to allow pt to go up and down 6  steps in a reciprocal manner.     Time  2    Period  Weeks        PT Long Term Goals - 10/31/17 1149      PT LONG TERM GOAL #1   Title  Pt to pain  in her right hip to be no greater than a 1/10 to allow pt to have no difficulty getting to sleep due to hip pain.     Time  4    Period  Weeks    Status  New      PT LONG TERM GOAL #2   Title  Pt to be able to single leg stance for at least 30 seconds on both LE to allow pt to feel confident walking on uneven terrain without an assistive device.     Time  4    Period  Weeks    Status  New      PT LONG TERM GOAL #3   Title  Pt strength in right hip increased one grade to allow pt to arise from a squatted position to pick items up off of the floor.    Time  4    Period  Weeks    Status  New      PT LONG TERM GOAL #4   Title  Pt to be walking in and outside the house without an assistive device.    Time  4    Period  Weeks    Status  New      PT LONG TERM GOAL #5   Title  PT to be working out at [employer] again to return to previous functional level.    Time  4    Period  Weeks    Status  New            Plan -  11/20/17 1121    Clinical Impression Statement  Pt treatment continues to focus on functional strengthening.  Hip mm is strengthening but ;pt still exhibits a trendelenburg gaiit after walking for any length of time.  Pt contiinues to have balance deficits as well and is not ready for vector stances.     Rehab Potential  Good    PT Frequency  2x / week    PT Duration  4 weeks    PT Treatment/Interventions  Therapeutic exercise;Balance training;Gait training;Stair training;Functional mobility training;Therapeutic activities;Patient/family education    PT Next Visit Plan  Reassess   Continue functional strengthening and balance training.      PT Home Exercise Plan  EVAL:  sit to stand, SLS, bent knee raise, bridge and sidelying abduction.        Patient will benefit from skilled therapeutic intervention in order to  improve the following deficits and impairments:  Abnormal gait, Decreased activity tolerance, Decreased balance, Difficulty walking, Decreased strength, Pain  Visit Diagnosis: Pain in right hip  Difficulty in walking, not elsewhere classified  Muscle weakness (generalized)     Problem List Patient Active Problem List   Diagnosis Date Noted  . Primary osteoarthritis of right hip 10/09/2017  . Osteoarthritis of right hip 10/04/2017  . Special screening for malignant neoplasms, colon 12/30/2016  . Breast cancer ([place]) 06/27/2011  . Hx Breast cancer, Right, multifocal IDC, receptor +, Her2 - 06/07/2006    Class: Stage 1  . Hx Breast cancer (DCIS), Right, Stage 0,  08/08/2001    Addie Cederberg,CINDY 11/20/2017, 11:23 AM  [facility] [address] [place], [place], [address] Phone: [phone]   Fax:  [phone]  Name: Haley Peterson MRN: [id number] Date of Birth: Jan 31, 1946

## 2017-11-21 DIAGNOSIS — M25561 Pain in right knee: Secondary | ICD-10-CM | POA: Diagnosis not present

## 2017-11-21 DIAGNOSIS — Z96641 Presence of right artificial hip joint: Secondary | ICD-10-CM | POA: Diagnosis not present

## 2017-11-21 DIAGNOSIS — M25562 Pain in left knee: Secondary | ICD-10-CM | POA: Diagnosis not present

## 2017-11-21 DIAGNOSIS — Z09 Encounter for follow-up examination after completed treatment for conditions other than malignant neoplasm: Secondary | ICD-10-CM | POA: Diagnosis not present

## 2017-11-23 ENCOUNTER — Other Ambulatory Visit: Payer: Self-pay

## 2017-11-23 ENCOUNTER — Ambulatory Visit (HOSPITAL_COMMUNITY): Payer: PPO

## 2017-11-23 ENCOUNTER — Encounter (HOSPITAL_COMMUNITY): Payer: Self-pay

## 2017-11-23 DIAGNOSIS — M25551 Pain in right hip: Secondary | ICD-10-CM | POA: Diagnosis not present

## 2017-11-23 DIAGNOSIS — M6281 Muscle weakness (generalized): Secondary | ICD-10-CM

## 2017-11-23 DIAGNOSIS — R262 Difficulty in walking, not elsewhere classified: Secondary | ICD-10-CM

## 2017-11-23 NOTE — Therapy (Signed)
[place], [place], [address] Phone: [phone]   Fax:  [phone]  Physical Therapy Treatment/Re-Assessment  Patient Details  Name: Haley Peterson MRN: [id number] Date of Birth: 11/24/1945 Referring Provider: Joanell Rising   Encounter Date: 11/23/2017  PT End of Session - 11/23/17 1311    Visit Number  8    Number of Visits  14    Date for PT Re-Evaluation  12/14/17    Authorization Type  [employer]    Authorization Time Period  11/23/17 - 12/14/17    PT Start Time  1038    PT Stop Time  1120    PT Time Calculation (min)  42 min    Equipment Utilized During Treatment  Gait belt    Activity Tolerance  Patient tolerated treatment well    Behavior During Therapy  [facility] for tasks assessed/performed       Past Medical History:  Diagnosis Date  . Arthritis   . Bone spur    left heel  . Cancer ([place])   . Dyspnea    recent last 2 months when walk to mail box- "winded" less active -no exercise 2 months   . History of breast cancer    left, right  . Hypertension   . Pneumonia 10/2012    Past Surgical History:  Procedure Laterality Date  . ABDOMINAL HYSTERECTOMY    . BIOPSY  04/12/2017   Procedure: BIOPSY;  Surgeon: Rogene Houston, MD;  [facility];  Service: Endoscopy;;  ascending colon ulcer biopsies  . BREAST LUMPECTOMY  08/28/2001   left  . BREAST RECONSTRUCTION Right    implant  . CHOLECYSTECTOMY  2002  . COLONOSCOPY    . COLONOSCOPY N/A 04/12/2017   Procedure: COLONOSCOPY;  Surgeon: Rogene Houston, MD;  [facility];  Service: Endoscopy;  Laterality: N/A;  830  . MASTECTOMY  08/15/2006   right  . PARTIAL HYSTERECTOMY  1985  . REPLACEMENT TOTAL KNEE  2009, 2010   right-2010, left 2009  . TISSUE EXPANDER PLACEMENT    . TOTAL HIP ARTHROPLASTY Right 10/09/2017   Procedure: RIGHT TOTAL HIP ARTHROPLASTY ANTERIOR APPROACH;  Surgeon: Frederik Pear, MD;  Location: [place];   Service: Orthopedics;  Laterality: Right;    There were no vitals filed for this visit.  Subjective Assessment - 11/23/17 1042    Subjective  Patient arrives today with greater knee pain today than prior session and states it has been worsening over last week. She states she has continued participating in her HEP and only carries her cane with her but does not use it unlesss her knee is hurting her. She reports currently her knee is causing more pain than her hip. She states she had a follow up wiht her surgeon yesterday and they siad her right hip has healed well. She will schedule a 3 month follow-up and the MD will take an image of her hp and knee at that time.    Pertinent History  HTN     How long can you sit comfortably?  limited by knee    How long can you stand comfortably?  limited by knee    How long can you walk comfortably?  limited by knee    Patient Stated Goals  Easier to put her shoes and socks on; to sleep better; to be walking without an assistive device, complete steps in a reciprocal manner.     Currently in  Pain?  Yes    Pain Score  3     Pain Location  Hip    Pain Orientation  Right    Pain Descriptors / Indicators  Aching;Sore    Pain Type  Surgical pain    Aggravating Factors   walking    Pain Relieving Factors  rest    Multiple Pain Sites  Yes    Pain Score  7    Pain Location  Knee    Pain Orientation  Right    Pain Descriptors / Indicators  Aching;Sharp    Pain Type  Acute pain    Pain Onset  1 to 4 weeks ago    Pain Frequency  Intermittent    Aggravating Factors   standing up from stiting, walking a lot    Pain Relieving Factors  been getting worse in last week; once get moving it feels better         [facility] PT Assessment - 11/23/17 0001      Assessment   Medical Diagnosis  Rt THR    Referring Provider  Joanell Rising    Onset Date/Surgical Date  10/09/17    Next MD Visit  follow up in 3 months (last appt. 11/21/17)    Prior Therapy  acute and HH       Precautions   Precautions  --      Restrictions   Weight Bearing Restrictions  No      Balance Screen   Has the patient fallen in the past 6 months  No    Has the patient had a decrease in activity level because of a fear of falling?   No    Is the patient reluctant to leave their home because of a fear of falling?   No      Functional Tests   Functional tests  Single leg stance;Sit to Stand      Single Leg Stance   Comments  Lt: 7 seconds; Rt: 7 seconds      Posture/Postural Control   Posture/Postural Control  No significant limitations    Postural Limitations  Increased lumbar lordosis      Strength   Right Hip Flexion  5/5    Right Hip Extension  4+/5    Right Hip ABduction  4+/5    Right Knee Extension  5/5    Left Knee Extension  5/5      Ambulation/Gait   Ambulation/Gait  Yes    Ambulation Distance (Feet)  600 Feet 3MWT    Assistive device  None    Gait Pattern  Trendelenburg    Ambulation Surface  Level    Stairs  Yes    Stairs Assistance  7: Independent    Stair Management Technique  One rail Right;Alternating pattern;Forwards    Number of Stairs  8    Height of Stairs  6      Standardized Balance Assessment   Standardized Balance Assessment  Five Times Sit to Stand    Five times sit to stand comments   8 seconds indidcating decreased fall risk and improved BLE functional strength      [facility] Adult PT Treatment/Exercise - 11/23/17 0001      Manual Therapy   Manual Therapy  Soft tissue mobilization    Manual therapy comments  performed seperate of other interventions    Soft tissue mobilization  sustained pressure and parallel mobilization to right qudriceps (vastus lateralis) and ITB      Balance  Exercises - 11/23/17 1324      Balance Exercises: Standing   Tandem Stance  Eyes open;30 secs;4 reps alternating foot position    SLS  3 reps;Eyes open;20 secs BLE       PT Education - 11/23/17 1310    Education provided  Yes    Education Details   Educated on progress in therapy so far and on continuation of POC to achieve remainig goals. Educated on improtance of HEP balance exercises and to add 3 seconds holds while marching at counter. Will provide SLS wtih foot back to HEP next session.    Person(s) Educated  Patient    Methods  Explanation    Comprehension  Verbalized understanding       PT Short Term Goals - 11/23/17 1040      PT SHORT TERM GOAL #1   Title  Pt to be ambulating in her home without her cane.     Baseline  11/23/17 - patient is walking without cane and only takes the cane if going out for safety but she denies using it. She uses sometimes in teh middle of night if getting up for the bathroom.    Time  2    Period  Weeks    Status  Achieved    Target Date  11/13/17      PT SHORT TERM GOAL #2   Title  PT to be able to single leg stance on both feet for 15 seconds to reduce risk of falling     Baseline  11/23/17 - RLE: 7 seconds, LE: 7 seconds    Time  2    Period  Weeks    Status  On-going      PT SHORT TERM GOAL #3   Title  Pt Rt LE strength to improve to allow pt to go up and down 6 steps in a reciprocal manner.     Baseline  11/23/17 - ascends/descend 8x 6" steps with 1 hand rail and step over step pattern    Time  2    Period  Weeks    Status  Achieved        PT Long Term Goals - 11/23/17 1046      PT LONG TERM GOAL #1   Title  Pt to pain  in her right hip to be no greater than a 1/10 to allow pt to have no difficulty getting to sleep due to hip pain.     Baseline  11/23/17 - 4/10 or less, hip pain doesn't wake her up anymore, it is her knee pain    Time  4    Period  Weeks    Status  On-going    Target Date  11/27/17      PT LONG TERM GOAL #2   Title  Pt to be able to single leg stance for at least 30 seconds on both LE to allow pt to feel confident walking on uneven terrain without an assistive device.     Baseline  11/23/17 - RLE: 7 seconds, LE: 7 seconds    Time  4    Period  Weeks     Status  On-going      PT LONG TERM GOAL #3   Title  Pt strength in right hip increased one grade to allow pt to arise from a squatted position to pick items up off of the floor.    Baseline  11/23/17 - increase in MMT by 1 grade or more  for alll groups tested    Time  4    Period  Weeks    Status  Partially Met      PT LONG TERM GOAL #4   Title  Pt to be walking in and outside the house without an assistive device.    Baseline  11/23/17 - patient is walking without cane and only takes the cane if going out for safety but she denies using it. She uses sometimes in teh middle of night if getting up for the bathroom.    Time  4    Period  Weeks    Status  Achieved      PT LONG TERM GOAL #5   Title  PT to be working out at [employer] again to return to previous functional level.    Baseline  11/23/17 - has gone 1 time to this point but is hoping to get into a regular routine.    Time  4    Period  Weeks    Status  Partially Met         Plan - 11/23/17 1313    Clinical Impression Statement  Re-assessment was performed today and patient has met/partially met 3/3 short term goals and 2/4 long term goals. She has made significant improvements with BLE strength however remains limited by decreased balance during single limb stance activitites and myofascial restrictions to right thigh muscuslature. She is also limited by right knee pain durign therapy. She will continue to benefit from skilled PT services to address current impairments and progress towards remaining goals to improve functional mobility and QOL.    Rehab Potential  Good    PT Frequency  2x / week    PT Duration  3 weeks    PT Treatment/Interventions  Therapeutic exercise;Balance training;Gait training;Stair training;Functional mobility training;Therapeutic activities;Patient/family education    PT Next Visit Plan    Continue functional strengthening and balance training.  Focus on SLS balance. Perform STW to right quad and ITB for  pain relief, patient responded well to sustained pressure and parallel mobilization to muscle.    PT Home Exercise Plan  EVAL:  sit to stand, SLS, bent knee raise, bridge and sidelying abduction; 11/23/17 - SLS with foo tback, tandem, marching at counter (print out next session.    Consulted and Agree with Plan of Care  Patient       Patient will benefit from skilled therapeutic intervention in order to improve the following deficits and impairments:  Abnormal gait, Decreased activity tolerance, Decreased balance, Difficulty walking, Decreased strength, Pain  Visit Diagnosis: Pain in right hip  Difficulty in walking, not elsewhere classified  Muscle weakness (generalized)     Problem List Patient Active Problem List   Diagnosis Date Noted  . Primary osteoarthritis of right hip 10/09/2017  . Osteoarthritis of right hip 10/04/2017  . Special screening for malignant neoplasms, colon 12/30/2016  . Breast cancer ([place]) 06/27/2011  . Hx Breast cancer, Right, multifocal IDC, receptor +, Her2 - 06/07/2006    Class: Stage 1  . Hx Breast cancer (DCIS), Right, Stage 0,  08/08/2001    Kipp Brood, PT, DPT Physical Therapist with [facility]  11/23/2017 1:31 PM    [facility] [address] [place], [place], [address] Phone: [phone]   Fax:  [phone]  Name: JAMIRAH ZELAYA MRN: [id number] Date of Birth: [birth year]

## 2017-11-27 ENCOUNTER — Ambulatory Visit (HOSPITAL_COMMUNITY): Payer: PPO

## 2017-11-27 ENCOUNTER — Other Ambulatory Visit: Payer: Self-pay

## 2017-11-27 ENCOUNTER — Encounter (HOSPITAL_COMMUNITY): Payer: Self-pay

## 2017-11-27 DIAGNOSIS — R262 Difficulty in walking, not elsewhere classified: Secondary | ICD-10-CM

## 2017-11-27 DIAGNOSIS — M6281 Muscle weakness (generalized): Secondary | ICD-10-CM

## 2017-11-27 DIAGNOSIS — M25551 Pain in right hip: Secondary | ICD-10-CM

## 2017-11-27 NOTE — Therapy (Signed)
[facility] [address] [place], [place], [address] Phone: [phone]   Fax:  [phone]  Physical Therapy Treatment  Patient Details  Name: Haley Peterson MRN: [id number] Date of Birth: May 28, 1946 Referring Provider: Joanell Rising   Encounter Date: 11/27/2017  PT End of Session - 11/27/17 1048    Visit Number  9    Number of Visits  14    Date for PT Re-Evaluation  12/14/17    Authorization Type  [employer]    Authorization Time Period  11/23/17 - 12/14/17    PT Start Time  1032    PT Stop Time  1113    PT Time Calculation (min)  41 min    Equipment Utilized During Treatment  Gait belt    Activity Tolerance  Patient tolerated treatment well    Behavior During Therapy  [facility] for tasks assessed/performed       Past Medical History:  Diagnosis Date  . Arthritis   . Bone spur    left heel  . Cancer ([place])   . Dyspnea    recent last 2 months when walk to mail box- "winded" less active -no exercise 2 months   . History of breast cancer    left, right  . Hypertension   . Pneumonia 10/2012    Past Surgical History:  Procedure Laterality Date  . ABDOMINAL HYSTERECTOMY    . BIOPSY  04/12/2017   Procedure: BIOPSY;  Surgeon: Rogene Houston, MD;  [facility];  Service: Endoscopy;;  ascending colon ulcer biopsies  . BREAST LUMPECTOMY  08/28/2001   left  . BREAST RECONSTRUCTION Right    implant  . CHOLECYSTECTOMY  2002  . COLONOSCOPY    . COLONOSCOPY N/A 04/12/2017   Procedure: COLONOSCOPY;  Surgeon: Rogene Houston, MD;  [facility];  Service: Endoscopy;  Laterality: N/A;  830  . MASTECTOMY  08/15/2006   right  . PARTIAL HYSTERECTOMY  1985  . REPLACEMENT TOTAL KNEE  2009, 2010   right-2010, left 2009  . TISSUE EXPANDER PLACEMENT    . TOTAL HIP ARTHROPLASTY Right 10/09/2017   Procedure: RIGHT TOTAL HIP ARTHROPLASTY ANTERIOR APPROACH;  Surgeon: Frederik Pear, MD;  Location: [place];  Service:  Orthopedics;  Laterality: Right;    There were no vitals filed for this visit.  Subjective Assessment - 11/27/17 1116    Subjective  Patient feels good today. States she was a little sore after the maual therapy last session but that it went away after a day.     Pertinent History  HTN     How long can you sit comfortably?  limited by knee    How long can you stand comfortably?  limited by knee    How long can you walk comfortably?  limited by knee    Patient Stated Goals  Easier to put her shoes and socks on; to sleep better; to be walking without an assistive device, complete steps in a reciprocal manner.     Currently in Pain?  Yes    Pain Score  1     Pain Location  Hip    Pain Orientation  Right    Pain Descriptors / Indicators  Aching;Sore    Pain Type  Surgical pain    Pain Onset  More than a month ago    Pain Frequency  Intermittent    Pain Onset  1 to 4 weeks ago  [facility] Adult PT Treatment/Exercise - 11/27/17 0001      Knee/Hip Exercises: Standing   Hip Abduction  AROM;Both;2 sets;10 reps;Knee straight;Limitations    Abduction Limitations  Red theraband    Hip Extension  AROM;Stengthening;Both;2 sets;10 reps;Knee straight    Extension Limitations  Red theraband    Lateral Step Up  20 reps;Step Height: 6";Hand Hold: 1;Both      Manual Therapy   Manual Therapy  Soft tissue mobilization    Manual therapy comments  performed seperate of other interventions    Soft tissue mobilization  sustained pressure and parallel mobilization to right qudriceps (vastus lateralis) and ITB        Balance Exercises - 11/27/17 1057      Balance Exercises: Standing   Tandem Stance  Eyes open;Intermittent upper extremity support    SLS  5 reps;Intermittent upper extremity support;Eyes open;Solid surface;10 secs BLE; in door frame    Marching Limitations  2x 1 minute on foam, 3 second holdsBLE        PT Education - 11/27/17 1048    Education provided  Yes    Education Details   Educated on form/techqnique throughtout session. updated HEP.    Person(s) Educated  Patient    Methods  Explanation;Handout    Comprehension  Verbalized understanding;Returned demonstration       PT Short Term Goals - 11/23/17 1040      PT SHORT TERM GOAL #1   Title  Pt to be ambulating in her home without her cane.     Baseline  11/23/17 - patient is walking without cane and only takes the cane if going out for safety but she denies using it. She uses sometimes in teh middle of night if getting up for the bathroom.    Time  2    Period  Weeks    Status  Achieved    Target Date  11/13/17      PT SHORT TERM GOAL #2   Title  PT to be able to single leg stance on both feet for 15 seconds to reduce risk of falling     Baseline  11/23/17 - RLE: 7 seconds, LE: 7 seconds    Time  2    Period  Weeks    Status  On-going      PT SHORT TERM GOAL #3   Title  Pt Rt LE strength to improve to allow pt to go up and down 6 steps in a reciprocal manner.     Baseline  11/23/17 - ascends/descend 8x 6" steps with 1 hand rail and step over step pattern    Time  2    Period  Weeks    Status  Achieved        PT Long Term Goals - 11/23/17 1046      PT LONG TERM GOAL #1   Title  Pt to pain  in her right hip to be no greater than a 1/10 to allow pt to have no difficulty getting to sleep due to hip pain.     Baseline  11/23/17 - 4/10 or less, hip pain doesn't wake her up anymore, it is her knee pain    Time  4    Period  Weeks    Status  On-going    Target Date  11/27/17      PT LONG TERM GOAL #2   Title  Pt to be able to single leg stance for at least 30 seconds on both  LE to allow pt to feel confident walking on uneven terrain without an assistive device.     Baseline  11/23/17 - RLE: 7 seconds, LE: 7 seconds    Time  4    Period  Weeks    Status  On-going      PT LONG TERM GOAL #3   Title  Pt strength in right hip increased one grade to allow pt to arise from a squatted position to pick items  up off of the floor.    Baseline  11/23/17 - increase in MMT by 1 grade or more for alll groups tested    Time  4    Period  Weeks    Status  Partially Met      PT LONG TERM GOAL #4   Title  Pt to be walking in and outside the house without an assistive device.    Baseline  11/23/17 - patient is walking without cane and only takes the cane if going out for safety but she denies using it. She uses sometimes in teh middle of night if getting up for the bathroom.    Time  4    Period  Weeks    Status  Achieved      PT LONG TERM GOAL #5   Title  PT to be working out at [employer] again to return to previous functional level.    Baseline  11/23/17 - has gone 1 time to this point but is hoping to get into a regular routine.    Time  4    Period  Weeks    Status  Partially Met        Plan - 11/27/17 1049    Clinical Impression Statement  Patient is making excellent progress in therapy and has advanced LE strengthening and balance training to advance towards remaining goals. She continues to be limited by decreased balance during single limb stance activities and myofascial restrictions to right thigh musculature. She will continue to benefit from skilled PT services to address current impairments and progress towards remaining goals to improve functional mobility and QOL.    Rehab Potential  Good    PT Frequency  2x / week    PT Duration  3 weeks    PT Treatment/Interventions  Therapeutic exercise;Balance training;Gait training;Stair training;Functional mobility training;Therapeutic activities;Patient/family education    PT Next Visit Plan    Continue functional strengthening and balance training.  Focus on SLS balance. Begin forward lunge on BOSU to advance balance exercises. Perform STW to right quad and ITB for pain relief, patient responded well to sustained pressure and parallel mobilization to muscle.    PT Home Exercise Plan  EVAL:  sit to stand, SLS, bent knee raise, bridge and sidelying  abduction; 11/23/17 - SLS with foo tback, tandem, marching at counter    Consulted and Agree with Plan of Care  Patient       Patient will benefit from skilled therapeutic intervention in order to improve the following deficits and impairments:  Abnormal gait, Decreased activity tolerance, Decreased balance, Difficulty walking, Decreased strength, Pain  Visit Diagnosis: Pain in right hip  Difficulty in walking, not elsewhere classified  Muscle weakness (generalized)     Problem List Patient Active Problem List   Diagnosis Date Noted  . Primary osteoarthritis of right hip 10/09/2017  . Osteoarthritis of right hip 10/04/2017  . Special screening for malignant neoplasms, colon 12/30/2016  . Breast cancer ([place]) 06/27/2011  . Hx Breast  cancer, Right, multifocal IDC, receptor +, Her2 - 06/07/2006    Class: Stage 1  . Hx Breast cancer (DCIS), Right, Stage 0,  08/08/2001    Kipp Brood, PT, DPT Physical Therapist with [facility]  11/27/2017 11:20 AM    [facility] [place], [place], [address] Phone: [phone]   Fax:  [phone]  Name: ULYANA PITONES MRN: [id number] Date of Birth: December 04, 1945

## 2017-11-27 NOTE — Patient Instructions (Signed)
    SINGLE LEG STANCE - SLS: 10 repetitions hold for 10-20 seconds  Stand on one leg and maintain your balance. Stand in front of kitchen counter for support if needed.      TANDEM STANCE BALANCE: 10 repetitions for 10-20 seconds  Stand and balnace in tandem stance. Hold this position. Relax and repeat. Stand next to kitchen counter or in between door frame.     Standing marching in place-Supported: 2-3 times for 1 minute (hold each knee up for 3-5 seconds)  Stand facing your kitchen countertop. Place 1 or 2 hands on the counter for support. Begin marching in place, bringing your left knee up then down, followed by your right knee up and down. Continue alternating legs in this manner for the duration of the exercise.

## 2017-12-01 ENCOUNTER — Encounter (HOSPITAL_COMMUNITY): Payer: Self-pay | Admitting: Physical Therapy

## 2017-12-01 ENCOUNTER — Ambulatory Visit (HOSPITAL_COMMUNITY): Payer: PPO | Admitting: Physical Therapy

## 2017-12-01 DIAGNOSIS — M6281 Muscle weakness (generalized): Secondary | ICD-10-CM

## 2017-12-01 DIAGNOSIS — M25551 Pain in right hip: Secondary | ICD-10-CM

## 2017-12-01 DIAGNOSIS — R262 Difficulty in walking, not elsewhere classified: Secondary | ICD-10-CM

## 2017-12-01 NOTE — Therapy (Signed)
[place], [place], [address] Phone: [phone]   Fax:  [phone]  Physical Therapy Treatment  Patient Details  Name: Haley Peterson MRN: [id number] Date of Birth: 08-29-1946 Referring Provider: Joanell Rising    Encounter Date: 12/01/2017  PT End of Session - 12/01/17 1118    Visit Number  10    Number of Visits  14    Date for PT Re-Evaluation  12/14/17    Authorization Type  [employer]    Authorization Time Period  11/23/17 - 12/14/17    Authorization - Visit Number  10    Authorization - Number of Visits  14    PT Start Time  8502    PT Stop Time  1119    PT Time Calculation (min)  39 min    Equipment Utilized During Treatment  Gait belt    Activity Tolerance  Patient tolerated treatment well    Behavior During Therapy  [facility] for tasks assessed/performed       Past Medical History:  Diagnosis Date  . Arthritis   . Bone spur    left heel  . Cancer ([place])   . Dyspnea    recent last 2 months when walk to mail box- "winded" less active -no exercise 2 months   . History of breast cancer    left, right  . Hypertension   . Pneumonia 10/2012    Past Surgical History:  Procedure Laterality Date  . ABDOMINAL HYSTERECTOMY    . BIOPSY  04/12/2017   Procedure: BIOPSY;  Surgeon: Rogene Houston, MD;  [facility];  Service: Endoscopy;;  ascending colon ulcer biopsies  . BREAST LUMPECTOMY  08/28/2001   left  . BREAST RECONSTRUCTION Right    implant  . CHOLECYSTECTOMY  2002  . COLONOSCOPY    . COLONOSCOPY N/A 04/12/2017   Procedure: COLONOSCOPY;  Surgeon: Rogene Houston, MD;  [facility];  Service: Endoscopy;  Laterality: N/A;  830  . MASTECTOMY  08/15/2006   right  . PARTIAL HYSTERECTOMY  1985  . REPLACEMENT TOTAL KNEE  2009, 2010   right-2010, left 2009  . TISSUE EXPANDER PLACEMENT    . TOTAL HIP ARTHROPLASTY Right 10/09/2017   Procedure: RIGHT TOTAL HIP ARTHROPLASTY  ANTERIOR APPROACH;  Surgeon: Frederik Pear, MD;  Location: [facility];  Service: Orthopedics;  Laterality: Right;    There were no vitals filed for this visit.      [facility] PT Assessment - 12/01/17 0001      Assessment   Medical Diagnosis  Rt THR    Referring Provider  Joanell Rising     Onset Date/Surgical Date  10/09/17    Next MD Visit  follow up in 3 months (last appt. 11/21/17)    Prior Therapy  acute and HH      Restrictions   Weight Bearing Restrictions  No      Observation/Other Assessments   Focus on Therapeutic Outcomes (FOTO)   53 was 53       Functional Tests   Functional tests  Single leg stance;Sit to Stand      Single Leg Stance   Comments  Lt: 7 seconds; Rt: 27 seconds was B 7 sec       Posture/Postural Control   Posture/Postural Control  No significant limitations    Postural Limitations  Increased lumbar lordosis      Strength   Right Hip Flexion  5/5 initially  2/5 now 5/5    Right Hip Extension  4/5 was 3+/5 now 4/5     Right Hip ABduction  4-/5 ws 3-/5    Right Knee Extension  5/5    Left Knee Extension  5/5      Ambulation/Gait   Ambulation/Gait  Yes    Ambulation Distance (Feet)  600 Feet 3MWT    Assistive device  None    Gait Pattern  Trendelenburg    Ambulation Surface  Level    Stairs  Yes    Stairs Assistance  7: Independent    Stair Management Technique  One rail Right;Alternating pattern;Forwards    Number of Stairs  8    Height of Stairs  6      Standardized Balance Assessment   Standardized Balance Assessment  Five Times Sit to Stand    Five times sit to stand comments   8 seconds indidcating decreased fall risk and improved BLE functional strength                  [facility] Adult PT Treatment/Exercise - 12/01/17 0001      Knee/Hip Exercises: Standing   SLS  5x      Knee/Hip Exercises: Seated   Sit to Sand  10 reps      Knee/Hip Exercises: Supine   Bridges  10 reps 10 second hold       Knee/Hip Exercises: Sidelying   Hip  ABduction  Right;10 reps 5 second hold       Knee/Hip Exercises: Prone   Hip Extension  Strengthening;Both;15 reps               PT Short Term Goals - 12/01/17 1106      PT SHORT TERM GOAL #1   Title  Pt to be ambulating in her home without her cane.     Baseline  11/23/17 - patient is walking without cane and only takes the cane if going out for safety but she denies using it. She uses sometimes in teh middle of night if getting up for the bathroom.    Time  2    Period  Weeks    Status  Achieved      PT SHORT TERM GOAL #2   Title  PT to be able to single leg stance on both feet for 15 seconds to reduce risk of falling     Baseline  12/01/17 - RLE: 27 seconds, LE: 7 seconds    Time  2    Period  Weeks    Status  Partially Met      PT SHORT TERM GOAL #3   Title  Pt Rt LE strength to improve to allow pt to go up and down 6 steps in a reciprocal manner.     Baseline  11/23/17 - ascends/descend 8x 6" steps with 1 hand rail and step over step pattern    Time  2    Period  Weeks    Status  Achieved        PT Long Term Goals - 12/01/17 1107      PT LONG TERM GOAL #1   Title  Pt to pain  in her right hip to be no greater than a 1/10 to allow pt to have no difficulty getting to sleep due to hip pain.     Baseline  12/01/17 -highest pain is a 2 /10 or less, hip pain doesn't wake her up anymore, it is her knee pain  Time  4    Period  Weeks    Status  On-going      PT LONG TERM GOAL #2   Title  Pt to be able to single leg stance for at least 30 seconds on both LE to allow pt to feel confident walking on uneven terrain without an assistive device.     Baseline  12/01/17 - RLE: 27 seconds, LE: 7 seconds    Time  4    Period  Weeks    Status  On-going      PT LONG TERM GOAL #3   Title  Pt strength in right hip increased one grade to allow pt to arise from a squatted position to pick items up off of the floor.    Baseline  11/23/17 - increase in MMT by 1 grade or more for  alll groups tested    Time  4    Period  Weeks    Status  Achieved      PT LONG TERM GOAL #4   Title  Pt to be walking in and outside the house without an assistive device.    Baseline  11/23/17 - patient is walking without cane and only takes the cane if going out for safety but she denies using it. She uses sometimes in teh middle of night if getting up for the bathroom.    Time  4    Period  Weeks    Status  Achieved      PT LONG TERM GOAL #5   Title  PT to be working out at [employer] again to return to previous functional level.    Baseline  11/23/17 - has gone 1 time to this point but is hoping to get into a regular routine.    Time  4    Period  Weeks    Status  Partially Met            Plan - 12/01/17 1224    Clinical Impression Statement  Pt reassessed today with gains in all areas.  Pt main deficit at this time is balance, and weakness in the gluteus minimus and gluteal maximus.  Pt was given a new HEP to concentrate on current deficits.  Pt is currently walking without a cane now.  Ms. Goggins will continue to benefit from skilled physical therapy to address the above deficits and maximize her functional ability.     Rehab Potential  Good    PT Frequency  2x / week    PT Duration  3 weeks    PT Treatment/Interventions  Therapeutic exercise;Balance training;Gait training;Stair training;Functional mobility training;Therapeutic activities;Patient/family education    PT Next Visit Plan  Tandem stance with UE and head motion, stair climbing and slow sit to stand      PT Home Exercise Plan  EVAL:  sit to stand, SLS, bent knee raise, bridge and sidelying abduction; 11/23/17 - SLS with foo tback, tandem, marching at counter    Consulted and Agree with Plan of Care  Patient       Patient will benefit from skilled therapeutic intervention in order to improve the following deficits and impairments:  Abnormal gait, Decreased activity tolerance, Decreased balance, Difficulty walking,  Decreased strength, Pain  Visit Diagnosis: Pain in right hip  Difficulty in walking, not elsewhere classified  Muscle weakness (generalized)     Problem List Patient Active Problem List   Diagnosis Date Noted  . Primary osteoarthritis of right hip 10/09/2017  .  Osteoarthritis of right hip 10/04/2017  . Special screening for malignant neoplasms, colon 12/30/2016  . Breast cancer ([place]) 06/27/2011  . Hx Breast cancer, Right, multifocal IDC, receptor +, Her2 - 06/07/2006    Class: Stage 1  . Hx Breast cancer (DCIS), Right, Stage 0,  08/08/2001    Rayetta Humphrey, PT CLT [phone] 12/01/2017, 12:25 PM  [facility] [address] [place], [place], [address] Phone: [phone]   Fax:  [phone]  Name: Haley Peterson MRN: [id number] Date of Birth: August 18, 1946

## 2017-12-01 NOTE — Patient Instructions (Addendum)
Straight Leg Raise (Prone)    Abdomen and head supported, keep left knee locked and raise leg at hip. Avoid arching low back. Repeat __15__ times per set. Do __1__ sets per session. Do _2___ sessions per day.  http://orth.exer.us/1112   Copyright  VHI. All rights reserved.  Bridging    Slowly raise buttocks from floor, keeping stomach tight.Hold for 10 seconds  Repeat __10__ times per set. Do _1___ sets per session. Do ____ sessions per day.2  http://orth.exer.us/1096   Copyright  VHI. All rights reserved.  Strengthening: Hip Abduction (Side-Lying)    Tighten muscles on front of left thigh, then lift leg _12___ inches from surface, keeping knee locked. Hold for 5 seconds  Repeat _10___ times per set. Do _1___ sets per session. Do ____2 sessions per day.  http://orth.exer.us/622   Copyright  VHI. All rights reserved.  Balance: Unilateral    Attempt to balance on left leg, eyes open. Hold _30_ seconds. Repeat __3-5__ times per set. Do ___1_ sets per session. Do __2__ sessions per day. Perform exercise with eyes closed.  http://orth.exer.us/28   Copyright  VHI. All rights reserved.

## 2017-12-06 ENCOUNTER — Ambulatory Visit (HOSPITAL_COMMUNITY): Payer: PPO

## 2017-12-06 ENCOUNTER — Encounter (HOSPITAL_COMMUNITY): Payer: Self-pay

## 2017-12-06 ENCOUNTER — Other Ambulatory Visit: Payer: Self-pay

## 2017-12-06 DIAGNOSIS — R262 Difficulty in walking, not elsewhere classified: Secondary | ICD-10-CM

## 2017-12-06 DIAGNOSIS — M6281 Muscle weakness (generalized): Secondary | ICD-10-CM

## 2017-12-06 DIAGNOSIS — M25551 Pain in right hip: Secondary | ICD-10-CM | POA: Diagnosis not present

## 2017-12-06 NOTE — Therapy (Signed)
[place], [place], [address] Phone: [phone]   Fax:  [phone]  Physical Therapy Treatment  Patient Details  Name: Haley Peterson MRN: [id number] Date of Birth: 1945/11/17 Referring Provider: Joanell Rising    Encounter Date: 12/06/2017  PT End of Session - 12/06/17 1432    Visit Number  11    Number of Visits  14    Date for PT Re-Evaluation  12/14/17    Authorization Type  [employer]    Authorization Time Period  11/23/17 - 12/14/17    Authorization - Visit Number  11    Authorization - Number of Visits  14    PT Start Time  1350    PT Stop Time  1429    PT Time Calculation (min)  39 min    Equipment Utilized During Treatment  Gait belt    Activity Tolerance  Patient tolerated treatment well    Behavior During Therapy  [facility] for tasks assessed/performed       Past Medical History:  Diagnosis Date  . Arthritis   . Bone spur    left heel  . Cancer ([place])   . Dyspnea    recent last 2 months when walk to mail box- "winded" less active -no exercise 2 months   . History of breast cancer    left, right  . Hypertension   . Pneumonia 10/2012    Past Surgical History:  Procedure Laterality Date  . ABDOMINAL HYSTERECTOMY    . BIOPSY  04/12/2017   Procedure: BIOPSY;  Surgeon: Rogene Houston, MD;  [facility];  Service: Endoscopy;;  ascending colon ulcer biopsies  . BREAST LUMPECTOMY  08/28/2001   left  . BREAST RECONSTRUCTION Right    implant  . CHOLECYSTECTOMY  2002  . COLONOSCOPY    . COLONOSCOPY N/A 04/12/2017   Procedure: COLONOSCOPY;  Surgeon: Rogene Houston, MD;  [facility];  Service: Endoscopy;  Laterality: N/A;  830  . MASTECTOMY  08/15/2006   right  . PARTIAL HYSTERECTOMY  1985  . REPLACEMENT TOTAL KNEE  2009, 2010   right-2010, left 2009  . TISSUE EXPANDER PLACEMENT    . TOTAL HIP ARTHROPLASTY Right 10/09/2017   Procedure: RIGHT TOTAL HIP ARTHROPLASTY  ANTERIOR APPROACH;  Surgeon: Frederik Pear, MD;  Location: [facility];  Service: Orthopedics;  Laterality: Right;    There were no vitals filed for this visit.  Subjective Assessment - 12/06/17 1636    Subjective  Patient reports her right hip is really sore today and she believes she may have overdone it at the gym. She reports she went on Monday and Tuesday so far this week and did upper body exercises and then 3 lower body exercises wiht low weight for 15 reps.     Pertinent History  HTN     Limitations  Standing;Walking;Lifting;House hold activities    Patient Stated Goals  Easier to put her shoes and socks on; to sleep better; to be walking without an assistive device, complete steps in a reciprocal manner.     Currently in Pain?  Yes    Pain Score  6     Pain Location  Hip    Pain Orientation  Right    Pain Descriptors / Indicators  Aching;Sore    Pain Type  Acute pain    Pain Onset  More than a month ago    Pain Frequency  Constant  Aggravating Factors   walking on right    Pain Relieving Factors  rest    Effect of Pain on Daily Activities  some    Multiple Pain Sites  No    Pain Onset  1 to 4 weeks ago       [facility] Adult PT Treatment/Exercise - 12/06/17 0001      Balance Poses: Yoga   Warrior I  2 reps;30 seconds;Limitations    Warrior I Limitations  4 reps total alternating foot position      Manual Therapy   Manual Therapy  Soft tissue mobilization    Manual therapy comments  performed seperate of other interventions    Soft tissue mobilization  sustained pressure and parallel mobilization to right gluteus medius        Balance Exercises - 12/06/17 1400      Balance Exercises: Standing   Tandem Stance  Eyes open;Foam/compliant surface;2 reps;30 secs 4 reps total, alternated foot position    Tandem Gait  Forward;2 reps;Intermittent upper extremity support;Foam/compliant surface    Sidestepping  Foam/compliant support;1 rep;Limitations both directions    Marching  Limitations  2x 1 minute on foam, 3 second holds BLE, pateitn requiring left UE support secodnary to right hip pain today    Other Standing Exercises  SLS with UUE, 3 cone tap, 10 reps BLE        PT Education - 12/06/17 1431    Education provided  Yes    Education Details  Educated on form/technique throughout. Educated to decrease reps at gym from 15 to 10 and perform every otehr day for recovery. Educated to focus on balance exercises in HEP. Educated to ice hip after exercises to manage pain. instructed to have barrier between ice/gel pack andskin to prevent cold burn.    Person(s) Educated  Patient    Methods  Explanation    Comprehension  Verbalized understanding       PT Short Term Goals - 12/01/17 1106      PT SHORT TERM GOAL #1   Title  Pt to be ambulating in her home without her cane.     Baseline  11/23/17 - patient is walking without cane and only takes the cane if going out for safety but she denies using it. She uses sometimes in teh middle of night if getting up for the bathroom.    Time  2    Period  Weeks    Status  Achieved      PT SHORT TERM GOAL #2   Title  PT to be able to single leg stance on both feet for 15 seconds to reduce risk of falling     Baseline  12/01/17 - RLE: 27 seconds, LE: 7 seconds    Time  2    Period  Weeks    Status  Partially Met      PT SHORT TERM GOAL #3   Title  Pt Rt LE strength to improve to allow pt to go up and down 6 steps in a reciprocal manner.     Baseline  11/23/17 - ascends/descend 8x 6" steps with 1 hand rail and step over step pattern    Time  2    Period  Weeks    Status  Achieved        PT Long Term Goals - 12/01/17 1107      PT LONG TERM GOAL #1   Title  Pt to pain  in her right hip to be  no greater than a 1/10 to allow pt to have no difficulty getting to sleep due to hip pain.     Baseline  12/01/17 -highest pain is a 2 /10 or less, hip pain doesn't wake her up anymore, it is her knee pain    Time  4    Period   Weeks    Status  On-going      PT LONG TERM GOAL #2   Title  Pt to be able to single leg stance for at least 30 seconds on both LE to allow pt to feel confident walking on uneven terrain without an assistive device.     Baseline  12/01/17 - RLE: 27 seconds, LE: 7 seconds    Time  4    Period  Weeks    Status  On-going      PT LONG TERM GOAL #3   Title  Pt strength in right hip increased one grade to allow pt to arise from a squatted position to pick items up off of the floor.    Baseline  11/23/17 - increase in MMT by 1 grade or more for alll groups tested    Time  4    Period  Weeks    Status  Achieved      PT LONG TERM GOAL #4   Title  Pt to be walking in and outside the house without an assistive device.    Baseline  11/23/17 - patient is walking without cane and only takes the cane if going out for safety but she denies using it. She uses sometimes in teh middle of night if getting up for the bathroom.    Time  4    Period  Weeks    Status  Achieved      PT LONG TERM GOAL #5   Title  PT to be working out at [employer] again to return to previous functional level.    Baseline  11/23/17 - has gone 1 time to this point but is hoping to get into a regular routine.    Time  4    Period  Weeks    Status  Partially Met            Plan - 12/06/17 1640    Clinical Impression Statement  Patient is progress well in therapy however was limited today by right hip pain secondary to increased participation in gym activity. Patient was educated to participate in gym exercises every other day to allow recovery time for her muscles. She was able to advance balance training today and was instructed to perform her balance exercises from her HEP as this remains her main limitation at this time. She reported decreased pain following soft tissue work to her right gluteus medius and was educated on benefits of ice for pain management. She will continue to benefit from skilled PT services to address  current impairments and progress towards remaining goals to improve functional mobility and QOL    Rehab Potential  Good    PT Frequency  2x / week    PT Duration  3 weeks    PT Treatment/Interventions  Therapeutic exercise;Balance training;Gait training;Stair training;Functional mobility training;Therapeutic activities;Patient/family education    PT Next Visit Plan  Continue tandem gait on foam and side stepping. Manual PRN for hip pain. Tandem stance with UE and head motion, stair climbing and slow sit to stand      PT Home Exercise Plan  EVAL:  sit to stand,  SLS, bent knee raise, bridge and sidelying abduction; 11/23/17 - SLS with foo tback, tandem, marching at counter    Consulted and Agree with Plan of Care  Patient       Patient will benefit from skilled therapeutic intervention in order to improve the following deficits and impairments:  Abnormal gait, Decreased activity tolerance, Decreased balance, Difficulty walking, Decreased strength, Pain  Visit Diagnosis: Pain in right hip  Difficulty in walking, not elsewhere classified  Muscle weakness (generalized)     Problem List Patient Active Problem List   Diagnosis Date Noted  . Primary osteoarthritis of right hip 10/09/2017  . Osteoarthritis of right hip 10/04/2017  . Special screening for malignant neoplasms, colon 12/30/2016  . Breast cancer ([place]) 06/27/2011  . Hx Breast cancer, Right, multifocal IDC, receptor +, Her2 - 06/07/2006    Class: Stage 1  . Hx Breast cancer (DCIS), Right, Stage 0,  08/08/2001    Kipp Brood, PT, DPT Physical Therapist with [facility]  12/06/2017 4:44 PM    [facility] [address] [place], [place], [address] Phone: [phone]   Fax:  [phone]  Name: Haley Peterson MRN: [id number] Date of Birth: 09/26/1946

## 2017-12-08 ENCOUNTER — Ambulatory Visit (HOSPITAL_COMMUNITY): Payer: PPO

## 2017-12-08 ENCOUNTER — Encounter (HOSPITAL_COMMUNITY): Payer: Self-pay

## 2017-12-08 DIAGNOSIS — M25551 Pain in right hip: Secondary | ICD-10-CM | POA: Diagnosis not present

## 2017-12-08 DIAGNOSIS — M6281 Muscle weakness (generalized): Secondary | ICD-10-CM

## 2017-12-08 DIAGNOSIS — R262 Difficulty in walking, not elsewhere classified: Secondary | ICD-10-CM

## 2017-12-08 NOTE — Therapy (Signed)
[place], [place], [address] Phone: [phone]   Fax:  [phone]  Physical Therapy Treatment  Patient Details  Name: Haley Peterson MRN: [id number] Date of Birth: 01-23-1946 Referring Provider: Joanell Rising    Encounter Date: 12/08/2017  PT End of Session - 12/08/17 1218    Visit Number  12    Number of Visits  14    Date for PT Re-Evaluation  12/14/17    Authorization Type  [employer]    Authorization Time Period  11/23/17 - 12/14/17    Authorization - Visit Number  12    Authorization - Number of Visits  14    PT Start Time  1033    PT Stop Time  1117    PT Time Calculation (min)  44 min    Equipment Utilized During Treatment  Gait belt    Activity Tolerance  Patient tolerated treatment well    Behavior During Therapy  [facility] for tasks assessed/performed       Past Medical History:  Diagnosis Date  . Arthritis   . Bone spur    left heel  . Cancer ([place])   . Dyspnea    recent last 2 months when walk to mail box- "winded" less active -no exercise 2 months   . History of breast cancer    left, right  . Hypertension   . Pneumonia 10/2012    Past Surgical History:  Procedure Laterality Date  . ABDOMINAL HYSTERECTOMY    . BIOPSY  04/12/2017   Procedure: BIOPSY;  Surgeon: Rogene Houston, MD;  [facility];  Service: Endoscopy;;  ascending colon ulcer biopsies  . BREAST LUMPECTOMY  08/28/2001   left  . BREAST RECONSTRUCTION Right    implant  . CHOLECYSTECTOMY  2002  . COLONOSCOPY    . COLONOSCOPY N/A 04/12/2017   Procedure: COLONOSCOPY;  Surgeon: Rogene Houston, MD;  [facility];  Service: Endoscopy;  Laterality: N/A;  830  . MASTECTOMY  08/15/2006   right  . PARTIAL HYSTERECTOMY  1985  . REPLACEMENT TOTAL KNEE  2009, 2010   right-2010, left 2009  . TISSUE EXPANDER PLACEMENT    . TOTAL HIP ARTHROPLASTY Right 10/09/2017   Procedure: RIGHT TOTAL HIP ARTHROPLASTY  ANTERIOR APPROACH;  Surgeon: Frederik Pear, MD;  Location: [facility];  Service: Orthopedics;  Laterality: Right;    There were no vitals filed for this visit.  Subjective Assessment - 12/08/17 1033    Subjective  Pt report she went to the gym for the first time Monday and Tuesday hurting today; did some legs exercises and elliptical    Pertinent History  HTN     Limitations  Standing;Walking;Lifting;House hold activities    Patient Stated Goals  Easier to put her shoes and socks on; to sleep better; to be walking without an assistive device, complete steps in a reciprocal manner.     Currently in Pain?  Yes    Pain Score  5     Pain Location  Hip    Pain Orientation  Right    Pain Descriptors / Indicators  -- muscle sore in right thigh    Pain Onset  More than a month ago    Pain Onset  1 to 4 weeks ago                           Balance Exercises -  12/08/17 1041      Balance Exercises: Standing   Tandem Stance  Eyes open;Foam/compliant surface;2 reps;30 secs  (Pended)  4 reps total, alternated foot position    SLS  Eyes open;Solid surface;Foam/compliant surface;Upper extremity support 1;Intermittent upper extremity support;5 reps;10 secs  (Pended)  BLE; floor and airex        PT Education - 12/08/17 1216    Education provided  Yes    Education Details  Educated on form/technique throughout. Educated on gym program to perform elliptical for 5 minutes only and progress as feels better; recumbent bike start with 5 minutes and do not increase time on elliptical and recumbent bike at the same time. Educated on improtance on strength, balance, and coordination and how they are interwoven.    Person(s) Educated  Patient    Methods  Explanation    Comprehension  Verbalized understanding       PT Short Term Goals - 12/08/17 1224      PT SHORT TERM GOAL #1   Title  Pt to be ambulating in her home without her cane.     Status  Partially Met      PT SHORT TERM GOAL #2    Title  PT to be able to single leg stance on both feet for 15 seconds to reduce risk of falling     Status  On-going      PT SHORT TERM GOAL #3   Title  Pt Rt LE strength to improve to allow pt to go up and down 6 steps in a reciprocal manner.     Status  On-going        PT Long Term Goals - 12/08/17 1225      PT LONG TERM GOAL #1   Title  Pt to pain  in her right hip to be no greater than a 1/10 to allow pt to have no difficulty getting to sleep due to hip pain.     Baseline  12/01/17 -highest pain is a 2 /10 or less, hip pain doesn't wake her up anymore, it is her knee pain    Time  4    Period  Weeks    Status  On-going      PT LONG TERM GOAL #2   Title  Pt to be able to single leg stance for at least 30 seconds on both LE to allow pt to feel confident walking on uneven terrain without an assistive device.     Baseline  12/01/17 - RLE: 27 seconds, LE: 7 seconds    Time  4    Period  Weeks    Status  On-going      PT LONG TERM GOAL #3   Title  Pt strength in right hip increased one grade to allow pt to arise from a squatted position to pick items up off of the floor.    Baseline  11/23/17 - increase in MMT by 1 grade or more for alll groups tested    Time  4    Period  Weeks    Status  Achieved      PT LONG TERM GOAL #4   Title  Pt to be walking in and outside the house without an assistive device.    Baseline  11/23/17 - patient is walking without cane and only takes the cane if going out for safety but she denies using it. She uses sometimes in teh middle of night if getting up for the bathroom.  Time  4    Period  Weeks    Status  Achieved      PT LONG TERM GOAL #5   Title  PT to be working out at [employer] again to return to previous functional level.    Baseline  11/23/17 - has gone 1 time to this point but is hoping to get into a regular routine.    Time  4    Period  Weeks    Status  Partially Met            Plan - 12/08/17 1220    Clinical Impression  Statement  Patient progressed well in therapy today however was somewhat limited by right lateral quadriceps pain secondary to increased participation at gym on Monday and Tuesday. PT performed scar tissue mobilization which may be epicenter of some of her discomfort. PT will follow up on post response to treatment. Pt to focus on elliptical and recumbent bike at gym and balance exercises as home to strength overall trunk and lower extremity strength, balance, and coordination to decrease load on joints. She will continue to benefit from skilled PT services to address current impairments and progress towards remaining goals to improve functional mobility and quality of life.    History and Personal Factors relevant to plan of care:  B TKR, back pain    Clinical Presentation  Stable    Rehab Potential  Good    PT Frequency  2x / week    PT Duration  3 weeks    PT Treatment/Interventions  Therapeutic exercise;Balance training;Gait training;Stair training;Functional mobility training;Therapeutic activities;Patient/family education    PT Next Visit Plan  Continue tandem gait on foam and side stepping. Manual PRN for hip pain. Tandem stance with UE and head motion, stair climbing and slow sit to stand      PT Home Exercise Plan  EVAL:  sit to stand, SLS, bent knee raise, bridge and sidelying abduction; 11/23/17 - SLS with foo tback, tandem, marching at counter    Consulted and Agree with Plan of Care  Patient       Patient will benefit from skilled therapeutic intervention in order to improve the following deficits and impairments:  Abnormal gait, Decreased activity tolerance, Decreased balance, Difficulty walking, Decreased strength, Pain  Visit Diagnosis: Pain in right hip  Difficulty in walking, not elsewhere classified  Muscle weakness (generalized)     Problem List Patient Active Problem List   Diagnosis Date Noted  . Primary osteoarthritis of right hip 10/09/2017  . Osteoarthritis of  right hip 10/04/2017  . Special screening for malignant neoplasms, colon 12/30/2016  . Breast cancer ([place]) 06/27/2011  . Hx Breast cancer, Right, multifocal IDC, receptor +, Her2 - 06/07/2006    Class: Stage 1  . Hx Breast cancer (DCIS), Right, Stage 0,  08/08/2001    Floria Raveling. Hartnett-Rands, MS, PT [facility] [place] #[id number] 12/08/2017, 12:28 PM  [place] [address] [place], [place], [address] Phone: [phone]   Fax:  [phone]  Name: Haley Peterson MRN: [id number] Date of Birth: August 16, 1946

## 2017-12-12 ENCOUNTER — Ambulatory Visit (HOSPITAL_COMMUNITY): Payer: PPO

## 2017-12-12 ENCOUNTER — Encounter (HOSPITAL_COMMUNITY): Payer: Self-pay

## 2017-12-12 ENCOUNTER — Other Ambulatory Visit: Payer: Self-pay

## 2017-12-12 DIAGNOSIS — R262 Difficulty in walking, not elsewhere classified: Secondary | ICD-10-CM

## 2017-12-12 DIAGNOSIS — M25551 Pain in right hip: Secondary | ICD-10-CM

## 2017-12-12 DIAGNOSIS — M6281 Muscle weakness (generalized): Secondary | ICD-10-CM

## 2017-12-12 NOTE — Therapy (Signed)
[place], [place], [address] Phone: [phone]   Fax:  [phone]  Physical Therapy Treatment  Patient Details  Name: Haley Peterson MRN: [id number] Date of Birth: [birth year] Referring Provider: Joanell Rising    Encounter Date: 12/12/2017  PT End of Session - 12/12/17 0921    Visit Number  13    Number of Visits  14    Date for PT Re-Evaluation  12/14/17    Authorization Type  [employer]    Authorization Time Period  11/23/17 - 12/14/17    Authorization - Visit Number  13    Authorization - Number of Visits  14    PT Start Time  0910    PT Stop Time  0948    PT Time Calculation (min)  38 min    Equipment Utilized During Treatment  Gait belt    Activity Tolerance  Patient tolerated treatment well;No increased pain patient reported decrease in pain to 2/10 at EOS    Behavior During Therapy  [facility] for tasks assessed/performed       Past Medical History:  Diagnosis Date  . Arthritis   . Bone spur    left heel  . Cancer ([place])   . Dyspnea    recent last 2 months when walk to mail box- "winded" less active -no exercise 2 months   . History of breast cancer    left, right  . Hypertension   . Pneumonia 10/2012    Past Surgical History:  Procedure Laterality Date  . ABDOMINAL HYSTERECTOMY    . BIOPSY  04/12/2017   Procedure: BIOPSY;  Surgeon: Rogene Houston, MD;  [facility];  Service: Endoscopy;;  ascending colon ulcer biopsies  . BREAST LUMPECTOMY  08/28/2001   left  . BREAST RECONSTRUCTION Right    implant  . CHOLECYSTECTOMY  2002  . COLONOSCOPY    . COLONOSCOPY N/A 04/12/2017   Procedure: COLONOSCOPY;  Surgeon: Rogene Houston, MD;  [facility];  Service: Endoscopy;  Laterality: N/A;  830  . MASTECTOMY  08/15/2006   right  . PARTIAL HYSTERECTOMY  1985  . REPLACEMENT TOTAL KNEE  2009, 2010   right-2010, left 2009  . TISSUE EXPANDER PLACEMENT    . TOTAL HIP  ARTHROPLASTY Right 10/09/2017   Procedure: RIGHT TOTAL HIP ARTHROPLASTY ANTERIOR APPROACH;  Surgeon: Frederik Pear, MD;  Location: [facility];  Service: Orthopedics;  Laterality: Right;    There were no vitals filed for this visit.  Subjective Assessment - 12/12/17 0912    Subjective  Patient went to the gym yesterday and did 5 minutes on the elliptical. She is planning to do 6 minutes tomorrow and slowly increase by 1 minute each time. She practiced her balance exercises while at the gym using the desk counter for safety when doing her single limb balance and side-stepping. She reports she was really sore last Friday after leaving the rehab office from the soft tissue work but her pain gradually improved over the weekend. She states her last appointment is this Thursday and she hopes she'd made good progress towards her goals.     Pertinent History  HTN     Limitations  Standing;Walking;Lifting;House hold activities    Patient Stated Goals  Easier to put her shoes and socks on; to sleep better; to be walking without an assistive device, complete steps in a reciprocal manner.     Currently in Pain?  Yes  Pain Score  5     Pain Location  Hip    Pain Orientation  Right    Pain Descriptors / Indicators  Aching;Sore    Pain Type  Chronic pain    Pain Onset  More than a month ago    Pain Frequency  Constant    Aggravating Factors   when she first starts to walk it hurts    Pain Relieving Factors  rest, elliptical at gym    Effect of Pain on Daily Activities  some    Multiple Pain Sites  Yes    Pain Score  5    Pain Location  Knee    Pain Orientation  Right    Pain Descriptors / Indicators  Aching    Pain Type  Chronic pain    Pain Onset  More than a month ago    Pain Frequency  Intermittent    Aggravating Factors   walking, standing up, stairs    Pain Relieving Factors  moving helps     Effect of Pain on Daily Activities  some        [facility] Adult PT Treatment/Exercise - 12/12/17 0001       Ambulation/Gait   Ambulation Distance (Feet)  200 Feet 2 bouts    Gait Pattern  Decreased stride length;Decreased step length - right;Decreased step length - left;Decreased hip/knee flexion - right;Decreased hip/knee flexion - left;Trendelenburg;Antalgic    Ambulation Surface  Level    Gait Comments  Walk at start of session and end of session to determine effect of treatment on giat: patient with decreased anatlgic gait pattern at end of session, decreased trendelenburg, and improved equal step length      Balance Poses: Yoga   Warrior I  2 reps;30 seconds;Limitations    Warrior I Limitations  4 reps total alternating foot position      Knee/Hip Exercises: Aerobic   Elliptical  5 minutes on level 1      Manual Therapy   Manual Therapy  Soft tissue mobilization    Manual therapy comments  performed seperate of other interventions    Soft tissue mobilization  Parallel mobilization of ITB and valstuas laterallis on right thigh; scar mobilization for R THA      Balance Exercises - 12/12/17 0944      Balance Exercises: Standing   Tandem Stance  Eyes open;Foam/compliant surface;2 reps;30 secs 4 reps total alternated foot position    SLS  Eyes open;Solid surface;2 reps;15 secs;Intermittent upper extremity support BLE        PT Education - 12/12/17 0918    Education provided  Yes    Education Details  Educate don form and thechniwue throughout seesion. Discussed working HEP into gym routine and allowing for recovery days. Educated on scar massage to reduce adheasions, instructed to perform cross-friction technique without lotion and then moisturize scar after.    Person(s) Educated  Patient    Methods  Explanation    Comprehension  Verbalized understanding       PT Short Term Goals - 12/08/17 1224      PT SHORT TERM GOAL #1   Title  Pt to be ambulating in her home without her cane.     Status  Partially Met      PT SHORT TERM GOAL #2   Title  PT to be able to single leg stance on  both feet for 15 seconds to reduce risk of falling     Status  On-going  PT SHORT TERM GOAL #3   Title  Pt Rt LE strength to improve to allow pt to go up and down 6 steps in a reciprocal manner.     Status  On-going        PT Long Term Goals - 12/08/17 1225      PT LONG TERM GOAL #1   Title  Pt to pain  in her right hip to be no greater than a 1/10 to allow pt to have no difficulty getting to sleep due to hip pain.     Baseline  12/01/17 -highest pain is a 2 /10 or less, hip pain doesn't wake her up anymore, it is her knee pain    Time  4    Period  Weeks    Status  On-going      PT LONG TERM GOAL #2   Title  Pt to be able to single leg stance for at least 30 seconds on both LE to allow pt to feel confident walking on uneven terrain without an assistive device.     Baseline  12/01/17 - RLE: 27 seconds, LE: 7 seconds    Time  4    Period  Weeks    Status  On-going      PT LONG TERM GOAL #3   Title  Pt strength in right hip increased one grade to allow pt to arise from a squatted position to pick items up off of the floor.    Baseline  11/23/17 - increase in MMT by 1 grade or more for alll groups tested    Time  4    Period  Weeks    Status  Achieved      PT LONG TERM GOAL #4   Title  Pt to be walking in and outside the house without an assistive device.    Baseline  11/23/17 - patient is walking without cane and only takes the cane if going out for safety but she denies using it. She uses sometimes in teh middle of night if getting up for the bathroom.    Time  4    Period  Weeks    Status  Achieved      PT LONG TERM GOAL #5   Title  PT to be working out at [employer] again to return to previous functional level.    Baseline  11/23/17 - has gone 1 time to this point but is hoping to get into a regular routine.    Time  4    Period  Weeks    Status  Partially Met        Plan - 12/12/17 0958    Clinical Impression Statement  Patient continues to progress independence for  exercises by demonstrating increased gym participation and has positive repose to exercise with elliptical reporting decreased right hip stiffness with activity. She arrived with lateral right thigh pain today and has significant adhesions along Rt THA scar. The majority of session focused on manual therapy to reduce adhesions and pain. Patient responded well reporting decrease in pain from 5/10 at start of session to 2/10 at end of session. She was educated on incorporating HEP into gym routine and allowing a recovery day between gym participation. Patient was educated on scar massage technique `and frequency to perform at home. She will continue to benefit from skilled PT services to address current impairments and progress towards goals to improve functional mobility and QOL.    History and Personal  Factors relevant to plan of care:  B TKR, back pain, R THA anterior approach    Rehab Potential  Good    PT Frequency  2x / week    PT Duration  3 weeks    PT Treatment/Interventions  Therapeutic exercise;Balance training;Gait training;Stair training;Functional mobility training;Therapeutic activities;Patient/family education    PT Next Visit Plan  Re-assess this session and determine if appropriate for discharge. Continue scar massage and manual therapy for vastus lateralis and ITB for pain PRN.  Continue tandem gait on foam and side stepping. Tandem stance with UE and head motion, stair climbing and slow sit to stand      PT Home Exercise Plan  EVAL:  sit to stand, SLS, bent knee raise, bridge and sidelying abduction; 11/23/17 - SLS with foo tback, tandem, marching at counter    Consulted and Agree with Plan of Care  Patient       Patient will benefit from skilled therapeutic intervention in order to improve the following deficits and impairments:  Abnormal gait, Decreased activity tolerance, Decreased balance, Difficulty walking, Decreased strength, Pain  Visit Diagnosis: Pain in right  hip  Difficulty in walking, not elsewhere classified  Muscle weakness (generalized)     Problem List Patient Active Problem List   Diagnosis Date Noted  . Primary osteoarthritis of right hip 10/09/2017  . Osteoarthritis of right hip 10/04/2017  . Special screening for malignant neoplasms, colon 12/30/2016  . Breast cancer ([place]) 06/27/2011  . Hx Breast cancer, Right, multifocal IDC, receptor +, Her2 - 06/07/2006    Class: Stage 1  . Hx Breast cancer (DCIS), Right, Stage 0,  08/08/2001    Kipp Brood, PT, DPT Physical Therapist with [facility]  12/12/2017 10:10 AM    [place], [place], [address] Phone: [phone]   Fax:  [phone]  Name: Haley Peterson MRN: [id number] Date of Birth: 03-Apr-1946

## 2017-12-14 ENCOUNTER — Ambulatory Visit (HOSPITAL_COMMUNITY): Payer: PPO

## 2017-12-14 ENCOUNTER — Encounter (HOSPITAL_COMMUNITY): Payer: Self-pay

## 2017-12-14 ENCOUNTER — Encounter (HOSPITAL_COMMUNITY): Payer: PPO

## 2017-12-14 ENCOUNTER — Other Ambulatory Visit: Payer: Self-pay

## 2017-12-14 DIAGNOSIS — R262 Difficulty in walking, not elsewhere classified: Secondary | ICD-10-CM

## 2017-12-14 DIAGNOSIS — M6281 Muscle weakness (generalized): Secondary | ICD-10-CM

## 2017-12-14 DIAGNOSIS — M25551 Pain in right hip: Secondary | ICD-10-CM | POA: Diagnosis not present

## 2017-12-14 NOTE — Therapy (Signed)
[place], [place], [address] Phone: [phone]   Fax:  [phone]  Physical Therapy Treatment/Re-Assessment  Patient Details  Name: Haley Peterson MRN: [id number] Date of Birth: 1945-12-16 Referring Provider: Joanell Rising   Encounter Date: 12/14/2017  PT End of Session - 12/14/17 1554    Visit Number  14    Number of Visits  20    Date for PT Re-Evaluation  01/04/18    Authorization Type  [employer]    Authorization Time Period  12/14/17 - 01/05/18    Authorization - Visit Number  1    Authorization - Number of Visits  6    PT Start Time  9417    PT Stop Time  1345    PT Time Calculation (min)  40 min    Activity Tolerance  Patient tolerated treatment well;No increased pain    Behavior During Therapy  WFL for tasks assessed/performed       Past Medical History:  Diagnosis Date  . Arthritis   . Bone spur    left heel  . Cancer ([place])   . Dyspnea    recent last 2 months when walk to mail box- "winded" less active -no exercise 2 months   . History of breast cancer    left, right  . Hypertension   . Pneumonia 10/2012    Past Surgical History:  Procedure Laterality Date  . ABDOMINAL HYSTERECTOMY    . BIOPSY  04/12/2017   Procedure: BIOPSY;  Surgeon: Rogene Houston, MD;  [facility];  Service: Endoscopy;;  ascending colon ulcer biopsies  . BREAST LUMPECTOMY  08/28/2001   left  . BREAST RECONSTRUCTION Right    implant  . CHOLECYSTECTOMY  2002  . COLONOSCOPY    . COLONOSCOPY N/A 04/12/2017   Procedure: COLONOSCOPY;  Surgeon: Rogene Houston, MD;  [facility];  Service: Endoscopy;  Laterality: N/A;  830  . MASTECTOMY  08/15/2006   right  . PARTIAL HYSTERECTOMY  1985  . REPLACEMENT TOTAL KNEE  2009, 2010   right-2010, left 2009  . TISSUE EXPANDER PLACEMENT    . TOTAL HIP ARTHROPLASTY Right 10/09/2017   Procedure: RIGHT TOTAL HIP ARTHROPLASTY ANTERIOR APPROACH;   Surgeon: Frederik Pear, MD;  Location: [facility];  Service: Orthopedics;  Laterality: Right;    There were no vitals filed for this visit.  Subjective Assessment - 12/14/17 1307    Subjective  Patient reports feeling sore from the elliptical here after her last session but reports after doing the elliptical at teh gym yesterday she did not feel as sore. She states she is frustrated that sheis not walking normally again and that she is still having a lot of pain in her left hip. She reports trying to do the scar massage herself and that is was a little difficult but she feels like it heps her walk..    Pertinent History  HTN     Limitations  Standing;Walking;Lifting;House hold activities    How long can you sit comfortably?  limited by knee    How long can you stand comfortably?  limited by knee    How long can you walk comfortably?  limited by left hip and right hip pain    Patient Stated Goals  Easier to put her shoes and socks on; to sleep better; to be walking without an assistive device, complete steps in a reciprocal manner.     Currently in  Pain?  Yes    Pain Score  4     Pain Location  Hip    Pain Orientation  Right;Anterior    Pain Descriptors / Indicators  Aching;Sore    Pain Type  Chronic pain    Pain Onset  More than a month ago    Pain Frequency  Constant    Aggravating Factors   when she first starts walking    Pain Relieving Factors  rest, massage, elliptical    Effect of Pain on Daily Activities  some limitation with walking mostly    Pain Score  -- left is bothering here too, same as right         [facility] PT Assessment - 12/14/17 0001      Assessment   Medical Diagnosis  Rt THR    Referring Provider  Joanell Rising    Onset Date/Surgical Date  10/09/17    Next MD Visit  follow up in 3 months (last appt. 11/21/17)    Prior Therapy  acute and HH      Precautions   Precautions  --      Observation/Other Assessments   Focus on Therapeutic Outcomes (FOTO)   47% lmited on  12/01/17      Single Leg Stance   Comments  Lt: 7 seconds; Rt: 7 seconds was Lt: 7 sec, Rt: 27 sec       Posture/Postural Control   Postural Limitations  Increased lumbar lordosis      AROM   Overall AROM Comments  WFL      Strength   Right Hip Flexion  5/5    Right Hip Extension  5/5    Right Hip ABduction  5/5    Left Hip Flexion  5/5    Left Hip Extension  5/5    Left Hip ABduction  5/5    Right/Left Knee  Right;Left    Right Knee Flexion  5/5    Right Knee Extension  5/5    Left Knee Flexion  5/5    Left Knee Extension  5/5      Flexibility   Soft Tissue Assessment /Muscle Length  no      Palpation   Palpation comment  Scar mobility limited with substantial adhesions noted along scar within 2 cm from incision      Ambulation/Gait   Stairs  Yes    Stairs Assistance  7: Independent    Stair Management Technique  One rail Right;Alternating pattern;Forwards    Number of Stairs  12    Height of Stairs  6    Gait Comments  Patient has greater difficulty ascending stairs rather tahn descending       [facility] Adult PT Treatment/Exercise - 12/14/17 0001      Manual Therapy   Manual Therapy  Soft tissue mobilization    Manual therapy comments  performed seperate of other interventions    Soft tissue mobilization  Parallel mobilization of ITB and valstuas laterallis on right thigh; scar mobilization for R THA      Balance Exercises - 12/14/17 1334      Balance Exercises: Standing   SLS  Eyes open;Solid surface;5 reps;10 secs contralateral leg hooked to facilitate muscle activation    Standing, One Foot on a Step  Eyes open;Foam/compliant surface;2 inch;10 secs;5 reps        PT Education - 12/14/17 1552    Education provided  Yes    Education Details  Educatd on progress towards goals  and on greatest limitation of myofascial restrictions from adheasions under scar as well as balance limitations. Educated on appropriate POC to continue addressing these limitations.  Discussed benefit of aquatic therapy and continuing to participate in gym routine every other day.    Person(s) Educated  Patient    Methods  Explanation    Comprehension  Verbalized understanding       PT Short Term Goals - 12/14/17 1303      PT SHORT TERM GOAL #1   Title  Pt to be ambulating in her home without her cane.     Status  Achieved      PT SHORT TERM GOAL #2   Title  PT to be able to single leg stance on both feet for 15 seconds to reduce risk of falling     Baseline  12/14/17 - Bil Le 7 seconds    Time  3    Period  Weeks    Status  On-going    Target Date  01/04/18      PT SHORT TERM GOAL #3   Title  Pt Rt LE strength to improve to allow pt to go up and down 6 steps in a reciprocal manner.     Baseline  11/23/17 - ascends/descend 12x 6" steps with 1 hand rail and step over step pattern    Status  Achieved        PT Long Term Goals - 12/14/17 1303      PT LONG TERM GOAL #1   Title  Pt to pain  in her right hip to be no greater than a 1/10 to allow pt to have no difficulty getting to sleep due to hip pain.     Baseline  12/01/17 -highest pain is a 2 /10 or less, hip pain doesn't wake her up anymore, it is her knee pain    Time  --    Period  Weeks    Status  On-going    Target Date  01/04/18      PT LONG TERM GOAL #2   Title  Pt to be able to single leg stance for at least 30 seconds on both LE to allow pt to feel confident walking on uneven terrain without an assistive device.     Baseline  12/01/17 - RLE: 27 seconds, LE: 7 seconds; 12/14/17 - Bil LE 7 seconds    Time  --    Period  Weeks    Status  On-going    Target Date  01/04/18      PT LONG TERM GOAL #3   Title  Pt strength in right hip increased one grade to allow pt to arise from a squatted position to pick items up off of the floor.    Baseline  11/23/17 - increase in MMT by 1 grade or more for alll groups tested    Time  4    Period  Weeks    Status  Achieved      PT LONG TERM GOAL #4   Title  Pt  to be walking in and outside the house without an assistive device.    Baseline  11/23/17 - patient is walking without cane and only takes the cane if going out for safety but she denies using it. She uses sometimes in teh middle of night if getting up for the bathroom.    Time  4    Period  Weeks    Status  Achieved  PT LONG TERM GOAL #5   Title  PT to be working out at the gym again to return to previous functional level.    Baseline  11/23/17 - has gone 1 time to this point but is hoping to get into a regular routine.; 12/14/17 - participating in gym routine 3x/week    Time  4    Period  Weeks    Status  Achieved        Plan - 12/14/17 1304    Clinical Impression Statement  Re-assessment was performed today and patient has met 2/3 short term goals and 3/5 long term goals. She has made significant improvements with BLE strength however remains limited by decreased balance during single limb stance activities. Her greatest limitation with pain is the significant myofascial restrictions along her hip and the adhesion of scar tissue along her incision. She is also limited by right knee pain during therapy. Patient has been educated on safe progression of gym activity and has initiated self-care of scar massage. She will continue to benefit from skilled PT services to address current impairments and progress towards remaining goals to improve functional mobility and QOL.    Rehab Potential  Good    PT Frequency  2x / week    PT Duration  3 weeks    PT Treatment/Interventions  Therapeutic exercise;Balance training;Gait training;Stair training;Functional mobility training;Therapeutic activities;Patient/family education    PT Next Visit Plan  Focus on scar massage and manual therapy for vastus lateralis and ITB for pain PRN.  Continue tandem gait on foam and side stepping and focus on single limb stance and stair training.    PT Home Exercise Plan  EVAL:  sit to stand, SLS, bent knee raise, bridge  and sidelying abduction; 11/23/17 - SLS with foo tback, tandem, marching at counter    Consulted and Agree with Plan of Care  Patient       Patient will benefit from skilled therapeutic intervention in order to improve the following deficits and impairments:  Abnormal gait, Decreased activity tolerance, Decreased balance, Difficulty walking, Decreased strength, Pain  Visit Diagnosis: Pain in right hip  Difficulty in walking, not elsewhere classified  Muscle weakness (generalized)     Problem List Patient Active Problem List   Diagnosis Date Noted  . Primary osteoarthritis of right hip 10/09/2017  . Osteoarthritis of right hip 10/04/2017  . Special screening for malignant neoplasms, colon 12/30/2016  . Breast cancer ([place]) 06/27/2011  . Hx Breast cancer, Right, multifocal IDC, receptor +, Her2 - 06/07/2006    Class: Stage 1  . Hx Breast cancer (DCIS), Right, Stage 0,  08/08/2001    Kipp Brood, PT, DPT Physical Therapist with [facility]  12/14/2017 5:35 PM    [place] [address] [place], [place], [address] Phone: [phone]   Fax:  [phone]  Name: JOSHUA ZERINGUE MRN: [id number] Date of Birth: Apr 06, 1946

## 2017-12-18 ENCOUNTER — Telehealth (HOSPITAL_COMMUNITY): Payer: Self-pay | Admitting: Internal Medicine

## 2017-12-18 NOTE — Telephone Encounter (Signed)
12/18/17  pt called to cx said she had "the worst cold", sneezing, coughing and doesn't want to spread it around

## 2017-12-19 ENCOUNTER — Encounter (HOSPITAL_COMMUNITY): Payer: PPO | Admitting: Physical Therapy

## 2017-12-21 ENCOUNTER — Encounter (HOSPITAL_COMMUNITY): Payer: PPO

## 2017-12-25 ENCOUNTER — Ambulatory Visit (HOSPITAL_COMMUNITY): Payer: PPO | Attending: Orthopedic Surgery

## 2017-12-25 ENCOUNTER — Encounter (HOSPITAL_COMMUNITY): Payer: Self-pay

## 2017-12-25 DIAGNOSIS — M25551 Pain in right hip: Secondary | ICD-10-CM

## 2017-12-25 DIAGNOSIS — R262 Difficulty in walking, not elsewhere classified: Secondary | ICD-10-CM

## 2017-12-25 DIAGNOSIS — M6281 Muscle weakness (generalized): Secondary | ICD-10-CM | POA: Diagnosis not present

## 2017-12-25 NOTE — Patient Instructions (Addendum)
  ELASTIC BAND - SIDELYING CLAM - [place]LL   While lying on your side with your knees bent and an elastic band wrapped around your knees, draw up the top knee while keeping contact of your feet together as shown.   Do not let your pelvis roll back during the lifting movement.    Add in with other exercises, 2-3 sets of 10-15 reps   lateral walk at pole or counter   Standing at a support surface (role or counter top) use hands to assist with balance. Perform sideways walking down and back until you feel that you need to take break.   It may be help to place a chair close to allow for safe rest breaks.

## 2017-12-25 NOTE — Therapy (Signed)
[place], [place], [address] Phone: [phone]   Fax:  [phone]  Physical Therapy Treatment  Patient Details  Name: Haley Peterson MRN: [id number] Date of Birth: Feb 03, 1946 Referring Provider: Joanell Peterson   Encounter Date: 12/25/2017  PT End of Session - 12/25/17 1028    Visit Number  15    Number of Visits  20    Date for PT Re-Evaluation  01/04/18    Authorization Type  [employer]    Authorization Time Period  12/14/17 - 01/05/18    Authorization - Visit Number  2    Authorization - Number of Visits  6    PT Start Time  9030    PT Stop Time  1110    PT Time Calculation (min)  42 min    Activity Tolerance  Patient tolerated treatment well;No increased pain    Behavior During Therapy  WFL for tasks assessed/performed       Past Medical History:  Diagnosis Date  . Arthritis   . Bone spur    left heel  . Cancer ([place])   . Dyspnea    recent last 2 months when walk to mail box- "winded" less active -no exercise 2 months   . History of breast cancer    left, right  . Hypertension   . Pneumonia 10/2012    Past Surgical History:  Procedure Laterality Date  . ABDOMINAL HYSTERECTOMY    . BIOPSY  04/12/2017   Procedure: BIOPSY;  Surgeon: Rogene Houston, MD;  [facility];  Service: Endoscopy;;  ascending colon ulcer biopsies  . BREAST LUMPECTOMY  08/28/2001   left  . BREAST RECONSTRUCTION Right    implant  . CHOLECYSTECTOMY  2002  . COLONOSCOPY    . COLONOSCOPY N/A 04/12/2017   Procedure: COLONOSCOPY;  Surgeon: Rogene Houston, MD;  [facility];  Service: Endoscopy;  Laterality: N/A;  830  . MASTECTOMY  08/15/2006   right  . PARTIAL HYSTERECTOMY  1985  . REPLACEMENT TOTAL KNEE  2009, 2010   right-2010, left 2009  . TISSUE EXPANDER PLACEMENT    . TOTAL HIP ARTHROPLASTY Right 10/09/2017   Procedure: RIGHT TOTAL HIP ARTHROPLASTY ANTERIOR APPROACH;  Surgeon: Frederik Pear, MD;  Location: [facility];  Service: Orthopedics;  Laterality: Right;    There were no vitals filed for this visit.  Subjective Assessment - 12/25/17 1028    Subjective  Pt states that she doesn't feel too bad today. She's having a little muscle soreness, but not really any pain.     Pertinent History  HTN     Limitations  Standing;Walking;Lifting;House hold activities    How long can you sit comfortably?  limited by knee    How long can you stand comfortably?  limited by knee    How long can you walk comfortably?  limited by left hip and right hip pain    Patient Stated Goals  Easier to put her shoes and socks on; to sleep better; to be walking without an assistive device, complete steps in a reciprocal manner.     Currently in Pain?  No/denies    Pain Onset  More than a month ago          [facility] Adult PT Treatment/Exercise - 12/25/17 0001      Knee/Hip Exercises: Standing   SLS with Vectors  on RLE, x5RT    Gait Training  fwd/retro tandem gait  on foam x2RT     Other Standing Knee Exercises  sidestepping with RTB 3f x3RT      Knee/Hip Exercises: Supine   Bridges  Both;15 reps    Single Leg Bridge  Right;3 sets;5 reps      Knee/Hip Exercises: Sidelying   Hip ABduction  --    Clams  RLE only, 2x15 with RTB      Manual Therapy   Manual Therapy  Soft tissue mobilization;Myofascial release    Manual therapy comments  performed seperate of other interventions    Soft tissue mobilization  instrument assisted STM of R ITB and VLO; scar mobilization for R THA    Myofascial Release  MFR to proximal R glute med          PT Education - 12/25/17 1028    Education provided  Yes    Education Details  exercise technique; updated HEP    Person(s) Educated  Patient    Methods  Explanation;Demonstration;Handout    Comprehension  Verbalized understanding;Returned demonstration       PT Short Term Goals - 12/14/17 1303      PT SHORT TERM GOAL #1   Title  Pt to be ambulating  in her home without her cane.     Status  Achieved      PT SHORT TERM GOAL #2   Title  PT to be able to single leg stance on both feet for 15 seconds to reduce risk of falling     Baseline  12/14/17 - Bil Le 7 seconds    Time  3    Period  Weeks    Status  On-going    Target Date  01/04/18      PT SHORT TERM GOAL #3   Title  Pt Rt LE strength to improve to allow pt to go up and down 6 steps in a reciprocal manner.     Baseline  11/23/17 - ascends/descend 12x 6" steps with 1 hand rail and step over step pattern    Status  Achieved        PT Long Term Goals - 12/14/17 1303      PT LONG TERM GOAL #1   Title  Pt to pain  in her right hip to be no greater than a 1/10 to allow pt to have no difficulty getting to sleep due to hip pain.     Baseline  12/01/17 -highest pain is a 2 /10 or less, hip pain doesn't wake her up anymore, it is her knee pain    Time  --    Period  Weeks    Status  On-going    Target Date  01/04/18      PT LONG TERM GOAL #2   Title  Pt to be able to single leg stance for at least 30 seconds on both LE to allow pt to feel confident walking on uneven terrain without an assistive device.     Baseline  12/01/17 - RLE: 27 seconds, LE: 7 seconds; 12/14/17 - Bil LE 7 seconds    Time  --    Period  Weeks    Status  On-going    Target Date  01/04/18      PT LONG TERM GOAL #3   Title  Pt strength in right hip increased one grade to allow pt to arise from a squatted position to pick items up off of the floor.    Baseline  11/23/17 - increase in MMT  by 1 grade or more for alll groups tested    Time  4    Period  Weeks    Status  Achieved      PT LONG TERM GOAL #4   Title  Pt to be walking in and outside the house without an assistive device.    Baseline  11/23/17 - patient is walking without cane and only takes the cane if going out for safety but she denies using it. She uses sometimes in teh middle of night if getting up for the bathroom.    Time  4    Period  Weeks     Status  Achieved      PT LONG TERM GOAL #5   Title  PT to be working out at [employer] again to return to previous functional level.    Baseline  11/23/17 - has gone 1 time to this point but is hoping to get into a regular routine.; 12/14/17 - participating in gym routine 3x/week    Time  4    Period  Weeks    Status  Achieved            Plan - 12/25/17 1111    Clinical Impression Statement  Session focused on gluteal strengthening, SLS, and addressing soft tissue restrictions of R ITB, VLO, and glute med. Pt reporting hip musculature fatigue during therex with min to no reports of knee discomfort this date. Pt wil palpable knots in proximal glute med which were tender to palpation. Added clams and sidestepping to HEP for glute med strengthening. Continue as planned, progressing as able.    Rehab Potential  Good    PT Frequency  2x / week    PT Duration  3 weeks    PT Treatment/Interventions  Therapeutic exercise;Balance training;Gait training;Stair training;Functional mobility training;Therapeutic activities;Patient/family education    PT Next Visit Plan  Focus on scar massage and manual therapy for vastus lateralis, ITB, glute med for pain PRN.  Continue tandem gait on foam and side stepping and focus on single limb stance and stair training. add hip hikes    PT Home Exercise Plan  EVAL:  sit to stand, SLS, bent knee raise, bridge and sidelying abduction; 11/23/17 - SLS with foo tback, tandem, marching at counter; 2/11: clams and sidestep with RTB    Consulted and Agree with Plan of Care  Patient       Patient will benefit from skilled therapeutic intervention in order to improve the following deficits and impairments:  Abnormal gait, Decreased activity tolerance, Decreased balance, Difficulty walking, Decreased strength, Pain  Visit Diagnosis: Pain in right hip  Difficulty in walking, not elsewhere classified  Muscle weakness (generalized)     Problem List Patient Active Problem  List   Diagnosis Date Noted  . Primary osteoarthritis of right hip 10/09/2017  . Osteoarthritis of right hip 10/04/2017  . Special screening for malignant neoplasms, colon 12/30/2016  . Breast cancer ([place]) 06/27/2011  . Hx Breast cancer, Right, multifocal IDC, receptor +, Her2 - 06/07/2006    Class: Stage 1  . Hx Breast cancer (DCIS), Right, Stage 0,  08/08/2001       Geraldine Solar PT, DPT  [facility] [address] [place], [place], [address] Phone: [phone]   Fax:  [phone]  Name: JANNIE DOYLE MRN: [id number] Date of Birth: [birth year]

## 2017-12-27 ENCOUNTER — Encounter (HOSPITAL_COMMUNITY): Payer: Self-pay

## 2017-12-27 ENCOUNTER — Other Ambulatory Visit: Payer: Self-pay

## 2017-12-27 ENCOUNTER — Ambulatory Visit (HOSPITAL_COMMUNITY): Payer: PPO

## 2017-12-27 DIAGNOSIS — M25551 Pain in right hip: Secondary | ICD-10-CM | POA: Diagnosis not present

## 2017-12-27 DIAGNOSIS — R262 Difficulty in walking, not elsewhere classified: Secondary | ICD-10-CM

## 2017-12-27 DIAGNOSIS — M6281 Muscle weakness (generalized): Secondary | ICD-10-CM

## 2017-12-27 NOTE — Therapy (Signed)
[place], [place], [address] Phone: [phone]   Fax:  [phone]  Physical Therapy Treatment  Patient Details  Name: Haley Peterson MRN: [id number] Date of Birth: [birth year] Referring Provider: Joanell Rising   Encounter Date: 12/27/2017  PT End of Session - 12/27/17 1035    Visit Number  16    Number of Visits  20    Date for PT Re-Evaluation  01/04/18    Authorization Type  [employer]    Authorization Time Period  12/14/17 - 01/05/18    Authorization - Visit Number  3    Authorization - Number of Visits  6    PT Start Time  0950    PT Stop Time  1030    PT Time Calculation (min)  40 min    Activity Tolerance  Patient tolerated treatment well;No increased pain    Behavior During Therapy  WFL for tasks assessed/performed       Past Medical History:  Diagnosis Date  . Arthritis   . Bone spur    left heel  . Cancer ([place])   . Dyspnea    recent last 2 months when walk to mail box- "winded" less active -no exercise 2 months   . History of breast cancer    left, right  . Hypertension   . Pneumonia 10/2012    Past Surgical History:  Procedure Laterality Date  . ABDOMINAL HYSTERECTOMY    . BIOPSY  04/12/2017   Procedure: BIOPSY;  Surgeon: Rogene Houston, MD;  [facility];  Service: Endoscopy;;  ascending colon ulcer biopsies  . BREAST LUMPECTOMY  08/28/2001   left  . BREAST RECONSTRUCTION Right    implant  . CHOLECYSTECTOMY  2002  . COLONOSCOPY    . COLONOSCOPY N/A 04/12/2017   Procedure: COLONOSCOPY;  Surgeon: Rogene Houston, MD;  [facility];  Service: Endoscopy;  Laterality: N/A;  830  . MASTECTOMY  08/15/2006   right  . PARTIAL HYSTERECTOMY  1985  . REPLACEMENT TOTAL KNEE  2009, 2010   right-2010, left 2009  . TISSUE EXPANDER PLACEMENT    . TOTAL HIP ARTHROPLASTY Right 10/09/2017   Procedure: RIGHT TOTAL HIP ARTHROPLASTY ANTERIOR APPROACH;  Surgeon: Frederik Pear, MD;  Location: [facility];  Service: Orthopedics;  Laterality: Right;    There were no vitals filed for this visit.  Subjective Assessment - 12/27/17 1036    Subjective  Patient reports she is feeling much better and thinks the soft tissue work has really been helping her pain and that she is walking more normally now. She states she did not get to the [facility] or do her HEP last week becasue she was sick and [facility] is [facility] to [facility] again next week. She reports there was some pain after the soft tissue work last session but it went away after about a day.    Pertinent History  HTN     Limitations  Standing;Walking;Lifting;House hold activities    How long can you sit comfortably?  limited by knee    How long can you stand comfortably?  limited by knee    How long can you walk comfortably?  limited by left hip and right hip pain    Patient Stated Goals  Easier to put her shoes and socks on; to sleep better; to be walking without an assistive device, complete steps in  a reciprocal manner.     Currently in Pain?  No/denies        [facility] Adult PT Treatment/Exercise - 12/27/17 0001      Knee/Hip Exercises: Standing   Forward Step Up  Right;15 reps;Hand Hold: 1;Step Height: 4" contralateral knee drive, ipsilateral hand suppprot      Knee/Hip Exercises: Sidelying   Clams  RLE only, 2x15 with RTB      Manual Therapy   Manual Therapy  Soft tissue mobilization;Myofascial release    Manual therapy comments  performed seperate of other interventions    Soft tissue mobilization  instrument assisted STM of R ITB and VLO; scar mobilization for R THA    Myofascial Release  MFR to proximal R glute med       Balance Exercises - 12/27/17 1014      Balance Exercises: Standing   SLS  Eyes open;Solid surface 10 reps Bil LE, 3 cones    Standing, One Foot on a Step  Eyes open;5 reps;4 inch;15 secs    Tandem Gait  Forward;Foam/compliant surface;4 reps cues to WS and  to for step reactions for safety    Sidestepping  Foam/compliant support;Limitations;2 reps 2 reps both directions, cues to WS into toes or heels         PT Education - 12/27/17 1012    Education provided  Yes    Education Details  exercise technique and cues for safe weight shifting throughout balance training.    Person(s) Educated  Patient    Methods  Explanation    Comprehension  Verbalized understanding       PT Short Term Goals - 12/14/17 1303      PT SHORT TERM GOAL #1   Title  Pt to be ambulating in her home without her cane.     Status  Achieved      PT SHORT TERM GOAL #2   Title  PT to be able to single leg stance on both feet for 15 seconds to reduce risk of falling     Baseline  12/14/17 - Bil Le 7 seconds    Time  3    Period  Weeks    Status  On-going    Target Date  01/04/18      PT SHORT TERM GOAL #3   Title  Pt Rt LE strength to improve to allow pt to go up and down 6 steps in a reciprocal manner.     Baseline  11/23/17 - ascends/descend 12x 6" steps with 1 hand rail and step over step pattern    Status  Achieved        PT Long Term Goals - 12/14/17 1303      PT LONG TERM GOAL #1   Title  Pt to pain  in her right hip to be no greater than a 1/10 to allow pt to have no difficulty getting to sleep due to hip pain.     Baseline  12/01/17 -highest pain is a 2 /10 or less, hip pain doesn't wake her up anymore, it is her knee pain    Time  --    Period  Weeks    Status  On-going    Target Date  01/04/18      PT LONG TERM GOAL #2   Title  Pt to be able to single leg stance for at least 30 seconds on both LE to allow pt to feel confident walking on uneven terrain without an assistive device.  Baseline  12/01/17 - RLE: 27 seconds, LE: 7 seconds; 12/14/17 - Bil LE 7 seconds    Time  --    Period  Weeks    Status  On-going    Target Date  01/04/18      PT LONG TERM GOAL #3   Title  Pt strength in right hip increased one grade to allow pt to arise from a  squatted position to pick items up off of the floor.    Baseline  11/23/17 - increase in MMT by 1 grade or more for alll groups tested    Time  4    Period  Weeks    Status  Achieved      PT LONG TERM GOAL #4   Title  Pt to be walking in and outside the house without an assistive device.    Baseline  11/23/17 - patient is walking without cane and only takes the cane if going out for safety but she denies using it. She uses sometimes in teh middle of night if getting up for the bathroom.    Time  4    Period  Weeks    Status  Achieved      PT LONG TERM GOAL #5   Title  PT to be working out at [employer] again to return to previous functional level.    Baseline  11/23/17 - has gone 1 time to this point but is hoping to get into a regular routine.; 12/14/17 - participating in gym routine 3x/week    Time  4    Period  Weeks    Status  Achieved         Plan - 12/27/17 1013    Clinical Impression Statement  Patient arrived to session with improved gait quality and no pain today. She is progressing well in therapy and demonstrated improved SLS balance through increased time during balance activities before LOB today. Manual therapy was performed with instrumentation and patient had positive response reporting decrease in pain at end of session. She continues to have palpable knots along glut med and VLO proximally and will continue to benefit from manual therapy to address myofascial restrictions. She will continue to benefit from skilled PT services to address current impairments and progress towards goals to improve functional mobility and QOL.    Rehab Potential  Good    PT Frequency  2x / week    PT Duration  3 weeks    PT Treatment/Interventions  Therapeutic exercise;Balance training;Gait training;Stair training;Functional mobility training;Therapeutic activities;Patient/family education    PT Next Visit Plan  Focus on scar massage and manual therapy for vastus lateralis, ITB, glute med for pain  PRN.  Continue tandem gait on foam and side stepping and focus on single limb stance and stair training. Continue with clamshell and add hip hike next session.    PT Home Exercise Plan  EVAL:  sit to stand, SLS, bent knee raise, bridge and sidelying abduction; 11/23/17 - SLS with foo tback, tandem, marching at counter; 2/11: clams and sidestep with RTB    Consulted and Agree with Plan of Care  Patient       Patient will benefit from skilled therapeutic intervention in order to improve the following deficits and impairments:  Abnormal gait, Decreased activity tolerance, Decreased balance, Difficulty walking, Decreased strength, Pain  Visit Diagnosis: Pain in right hip  Difficulty in walking, not elsewhere classified  Muscle weakness (generalized)     Problem List Patient Active Problem  List   Diagnosis Date Noted  . Primary osteoarthritis of right hip 10/09/2017  . Osteoarthritis of right hip 10/04/2017  . Special screening for malignant neoplasms, colon 12/30/2016  . Breast cancer ([place]) 06/27/2011  . Hx Breast cancer, Right, multifocal IDC, receptor +, Her2 - 06/07/2006    Class: Stage 1  . Hx Breast cancer ([facility]IS), Right, Stage 0,  08/08/2001    Kipp Brood, PT, DPT Physical Therapist with [facility]  12/27/2017 10:45 AM    [facility] [address] [place], [place], [address] Phone: [phone]   Fax:  [phone]  Name: ANIKO FINNIGAN MRN: [id number] Date of Birth: Nov 13, 1946

## 2018-01-02 ENCOUNTER — Encounter (HOSPITAL_COMMUNITY): Payer: Self-pay

## 2018-01-02 ENCOUNTER — Ambulatory Visit (HOSPITAL_COMMUNITY): Payer: PPO

## 2018-01-02 DIAGNOSIS — M6281 Muscle weakness (generalized): Secondary | ICD-10-CM

## 2018-01-02 DIAGNOSIS — R262 Difficulty in walking, not elsewhere classified: Secondary | ICD-10-CM

## 2018-01-02 DIAGNOSIS — M25551 Pain in right hip: Secondary | ICD-10-CM

## 2018-01-02 NOTE — Therapy (Signed)
[facility] [address] [place], [place], [address] Phone: [phone]   Fax:  [phone]  Physical Therapy Treatment  Patient Details  Name: Haley Peterson MRN: [id number] Date of Birth: November 19, 1945 Referring Provider: Joanell Rising   Encounter Date: 01/02/2018  PT End of Session - 01/02/18 1038    Visit Number  17    Number of Visits  20    Date for PT Re-Evaluation  01/04/18    Authorization Type  [employer]    Authorization Time Period  12/14/17 - 01/05/18    PT Start Time  1034    PT Stop Time  1116    PT Time Calculation (min)  42 min    Equipment Utilized During Treatment  Gait belt    Activity Tolerance  Patient tolerated treatment well;No increased pain    Behavior During Therapy  WFL for tasks assessed/performed       Past Medical History:  Diagnosis Date  . Arthritis   . Bone spur    left heel  . Cancer ([place])   . Dyspnea    recent last 2 months when walk to mail box- "winded" less active -no exercise 2 months   . History of breast cancer    left, right  . Hypertension   . Pneumonia 10/2012    Past Surgical History:  Procedure Laterality Date  . ABDOMINAL HYSTERECTOMY    . BIOPSY  04/12/2017   Procedure: BIOPSY;  Surgeon: Rogene Houston, MD;  [facility];  Service: Endoscopy;;  ascending colon ulcer biopsies  . BREAST LUMPECTOMY  08/28/2001   left  . BREAST RECONSTRUCTION Right    implant  . CHOLECYSTECTOMY  2002  . COLONOSCOPY    . COLONOSCOPY N/A 04/12/2017   Procedure: COLONOSCOPY;  Surgeon: Rogene Houston, MD;  [facility];  Service: Endoscopy;  Laterality: N/A;  830  . MASTECTOMY  08/15/2006   right  . PARTIAL HYSTERECTOMY  1985  . REPLACEMENT TOTAL KNEE  2009, 2010   right-2010, left 2009  . TISSUE EXPANDER PLACEMENT    . TOTAL HIP ARTHROPLASTY Right 10/09/2017   Procedure: RIGHT TOTAL HIP ARTHROPLASTY ANTERIOR APPROACH;  Surgeon: Frederik Pear, MD;  Location: [facility];   Service: Orthopedics;  Laterality: Right;    There were no vitals filed for this visit.  Subjective Assessment - 01/02/18 1036    Subjective  Pt stated she is a little sore following the gym yesterday, no reoprts of pain today.      Patient Stated Goals  Easier to put her shoes and socks on; to sleep better; to be walking without an assistive device, complete steps in a reciprocal manner.     Currently in Pain?  No/denies                      [facility] Adult PT Treatment/Exercise - 01/02/18 0001      Knee/Hip Exercises: Standing   Lateral Step Up  2 sets;10 reps;Step Height: 2" hip hike BLE 2x 10    Forward Step Up  Right;15 reps;Hand Hold: 1;Step Height: 6" contralateral knee drive       Manual Therapy   Manual Therapy  Soft tissue mobilization;Myofascial release    Manual therapy comments  performed seperate of other interventions    Myofascial Release  MFR to proximal R glute med          Balance Exercises - 01/02/18 1053  Balance Exercises: Standing   SLS  Eyes open;Solid surface Rt 16", Lt 12"    SLS with Vectors  3 reps 3x 5" BLE 1 HHA    Standing, One Foot on a Step  Eyes open;5 reps;4 inch;15 secs    Balance Beam  tandem and sidestep 2RT    Tandem Gait  Forward;Foam/compliant surface;4 reps    Sidestepping  Foam/compliant support;Limitations;2 reps    Other Standing Exercises  SLS with UUE, 3 cone tap, 10 reps BLE          PT Short Term Goals - 12/14/17 1303      PT SHORT TERM GOAL #1   Title  Pt to be ambulating in her home without her cane.     Status  Achieved      PT SHORT TERM GOAL #2   Title  PT to be able to single leg stance on both feet for 15 seconds to reduce risk of falling     Baseline  12/14/17 - Bil Le 7 seconds    Time  3    Period  Weeks    Status  On-going    Target Date  01/04/18      PT SHORT TERM GOAL #3   Title  Pt Rt LE strength to improve to allow pt to go up and down 6 steps in a reciprocal manner.     Baseline   11/23/17 - ascends/descend 12x 6" steps with 1 hand rail and step over step pattern    Status  Achieved        PT Long Term Goals - 12/14/17 1303      PT LONG TERM GOAL #1   Title  Pt to pain  in her right hip to be no greater than a 1/10 to allow pt to have no difficulty getting to sleep due to hip pain.     Baseline  12/01/17 -highest pain is a 2 /10 or less, hip pain doesn't wake her up anymore, it is her knee pain    Time  --    Period  Weeks    Status  On-going    Target Date  01/04/18      PT LONG TERM GOAL #2   Title  Pt to be able to single leg stance for at least 30 seconds on both LE to allow pt to feel confident walking on uneven terrain without an assistive device.     Baseline  12/01/17 - RLE: 27 seconds, LE: 7 seconds; 12/14/17 - Bil LE 7 seconds    Time  --    Period  Weeks    Status  On-going    Target Date  01/04/18      PT LONG TERM GOAL #3   Title  Pt strength in right hip increased one grade to allow pt to arise from a squatted position to pick items up off of the floor.    Baseline  11/23/17 - increase in MMT by 1 grade or more for alll groups tested    Time  4    Period  Weeks    Status  Achieved      PT LONG TERM GOAL #4   Title  Pt to be walking in and outside the house without an assistive device.    Baseline  11/23/17 - patient is walking without cane and only takes the cane if going out for safety but she denies using it. She uses sometimes in teh middle of  night if getting up for the bathroom.    Time  4    Period  Weeks    Status  Achieved      PT LONG TERM GOAL #5   Title  PT to be working out at [employer] again to return to previous functional level.    Baseline  11/23/17 - has gone 1 time to this point but is hoping to get into a regular routine.; 12/14/17 - participating in gym routine 3x/week    Time  4    Period  Weeks    Status  Achieved            Plan - 01/02/18 1205    Clinical Impression Statement  Session focus with gluteal  strengthening and manual techqniues to improve gait quality.  Pt continues to demonstrate impaired balance activities especially with SLS.  Included balance and functional strengthening for gluteal activation.  Added hip hikes for strengthening with moderate cueing to improve form and techniques.  EOS with manual to address soft tissue and myofascial restrictions, noted improved gait mechanics following manual.      Rehab Potential  Good    PT Frequency  2x / week    PT Duration  3 weeks    PT Treatment/Interventions  Therapeutic exercise;Balance training;Gait training;Stair training;Functional mobility training;Therapeutic activities;Patient/family education    PT Next Visit Plan  Focus on scar massage and manual therapy for vastus lateralis, ITB, glute med for pain PRN.  Continue tandem gait on foam and side stepping and focus on single limb stance and stair training. Continue with hip hike and review form with clam as HEP next session.    PT Home Exercise Plan  EVAL:  sit to stand, SLS, bent knee raise, bridge and sidelying abduction; 11/23/17 - SLS with foo tback, tandem, marching at counter; 2/11: clams and sidestep with RTB       Patient will benefit from skilled therapeutic intervention in order to improve the following deficits and impairments:  Abnormal gait, Decreased activity tolerance, Decreased balance, Difficulty walking, Decreased strength, Pain  Visit Diagnosis: Pain in right hip  Difficulty in walking, not elsewhere classified  Muscle weakness (generalized)     Problem List Patient Active Problem List   Diagnosis Date Noted  . Primary osteoarthritis of right hip 10/09/2017  . Osteoarthritis of right hip 10/04/2017  . Special screening for malignant neoplasms, colon 12/30/2016  . Breast cancer ([place]) 06/27/2011  . Hx Breast cancer, Right, multifocal IDC, receptor +, Her2 - 06/07/2006    Class: Stage 1  . Hx Breast cancer ([facility]IS), Right, Stage 0,  08/08/2001   Ihor Austin, LPTA; CBIS [phone]  Aldona Lento 01/02/2018, 12:12 PM  [facility] [address] [place], [place], [address] Phone: [phone]   Fax:  [phone]  Name: Haley Peterson MRN: [id number] Date of Birth: [birth year]

## 2018-01-04 ENCOUNTER — Ambulatory Visit (HOSPITAL_COMMUNITY): Payer: PPO

## 2018-01-04 ENCOUNTER — Other Ambulatory Visit: Payer: Self-pay

## 2018-01-04 ENCOUNTER — Encounter (HOSPITAL_COMMUNITY): Payer: Self-pay

## 2018-01-04 DIAGNOSIS — M25551 Pain in right hip: Secondary | ICD-10-CM

## 2018-01-04 DIAGNOSIS — M6281 Muscle weakness (generalized): Secondary | ICD-10-CM

## 2018-01-04 DIAGNOSIS — R262 Difficulty in walking, not elsewhere classified: Secondary | ICD-10-CM

## 2018-01-04 NOTE — Therapy (Signed)
[place], [place], [address] Phone: [phone]   Fax:  [phone]  Physical Therapy Treatment/Discharge Summary  Patient Details  Name: Haley Peterson MRN: [id number] Date of Birth: 1946-08-24 Referring Provider: Joanell Rising   Encounter Date: 01/04/2018   PHYSICAL THERAPY DISCHARGE SUMMARY  Visits from Start of Care: 18  Current functional level related to goals / functional outcomes: Re-assessment was performed today and patient has met/partially met 3/3 short term goals and 5/5 long term goals. She continues to demonstrate excellent Bil LE and has demonstrated improved SLS with the Rt LE. She was unable to improve on her Lt SLS and complained of knee pain as her primary limitation. Her FOTO score improved by 12% indicating a significant improvement in functional limitations. She has been educated on safe progression of gym activity and has initiated self-care of scar massage and soft tissue mobilization. She reports feeling confident in progressing herself at the gym and managing independently. She will be discharged after today's session.    Remaining deficits: Bil knee pain.   Education / Equipment: Educated on progress twoards goals and safe progression of HEP and gym/fitness program frequency. Educated on performign soft tissue mobilization at home and offered multiple locations patient can obtain massage tools if she would like to.   Plan: Patient agrees to discharge.  Patient goals were met. Patient is being discharged due to meeting the stated rehab goals.  ?????      PT End of Session - 01/04/18 1356    Visit Number  18    Number of Visits  20    Date for PT Re-Evaluation  01/04/18    Authorization Type  [employer]    Authorization Time Period  12/14/17 - 01/05/18    Authorization - Visit Number  5    Authorization - Number of Visits  6    PT Start Time  1351    PT Stop Time  1418    PT  Time Calculation (min)  27 min    Equipment Utilized During Treatment  Gait belt    Activity Tolerance  Patient tolerated treatment well;No increased pain    Behavior During Therapy  WFL for tasks assessed/performed       Past Medical History:  Diagnosis Date  . Arthritis   . Bone spur    left heel  . Cancer ([place])   . Dyspnea    recent last 2 months when walk to mail box- "winded" less active -no exercise 2 months   . History of breast cancer    left, right  . Hypertension   . Pneumonia 10/2012    Past Surgical History:  Procedure Laterality Date  . ABDOMINAL HYSTERECTOMY    . BIOPSY  04/12/2017   Procedure: BIOPSY;  Surgeon: Rogene Houston, MD;  [facility];  Service: Endoscopy;;  ascending colon ulcer biopsies  . BREAST LUMPECTOMY  08/28/2001   left  . BREAST RECONSTRUCTION Right    implant  . CHOLECYSTECTOMY  2002  . COLONOSCOPY    . COLONOSCOPY N/A 04/12/2017   Procedure: COLONOSCOPY;  Surgeon: Rogene Houston, MD;  [facility];  Service: Endoscopy;  Laterality: N/A;  830  . MASTECTOMY  08/15/2006   right  . PARTIAL HYSTERECTOMY  1985  . REPLACEMENT TOTAL KNEE  2009, 2010   right-2010, left 2009  . TISSUE EXPANDER PLACEMENT    . TOTAL HIP ARTHROPLASTY Right 10/09/2017  Procedure: RIGHT TOTAL HIP ARTHROPLASTY ANTERIOR APPROACH;  Surgeon: Frederik Pear, MD;  Location: [facility];  Service: Orthopedics;  Laterality: Right;    There were no vitals filed for this visit.  Subjective Assessment - 01/04/18 1353    Subjective  She states she is sore from her last session but it is not in her hip. Both knees are bothering her. She reports she went to the gym on monday and is plannig to continue going next week. She reports her knees are bothering here a lot more     Limitations  Standing;Walking;Lifting;House hold activities    How long can you sit comfortably?  limited by knees    How long can you stand comfortably?  limited by knees    How long can  you walk comfortably?  limited by knees    Patient Stated Goals  Easier to put her shoes and socks on; to sleep better; to be walking without an assistive device, complete steps in a reciprocal manner.     Currently in Pain?  Yes    Pain Location  Knee    Pain Orientation  Right;Left    Pain Descriptors / Indicators  Sore    Pain Onset  More than a month ago    Pain Frequency  Constant    Multiple Pain Sites  No         [facility] PT Assessment - 01/04/18 0001      Assessment   Medical Diagnosis  Rt THR    Referring Provider  Joanell Rising    Onset Date/Surgical Date  10/09/17    Next MD Visit  follow up in 3 months (last appt. 11/21/17)    Prior Therapy  acute and HH      Observation/Other Assessments   Focus on Therapeutic Outcomes (FOTO)   35% limited 47% limited on 12/01/17      Single Leg Stance   Comments  Lt: 7 seconds; Rt: 30 seconds left knee pain      Ambulation/Gait   Gait Pattern  Within Functional Limits        [facility] Adult PT Treatment/Exercise - 01/04/18 0001      Manual Therapy   Manual Therapy  Soft tissue mobilization;Myofascial release    Manual therapy comments  performed seperate of other interventions    Myofascial Release  instrument assisted soft tissue mobilization to vastus lateralis and medialis for bil LE's         PT Education - 01/04/18 1428    Education provided  Yes    Education Details  Educated on progress twoards goals and safe progression of HEP and gym/fitness program frequency. Educated on performign soft tissue mobilization at home and offered multiple locations patient can obtain massage tools if she would like to.    Person(s) Educated  Patient    Methods  Explanation    Comprehension  Verbalized understanding       PT Short Term Goals - 01/04/18 1350      PT SHORT TERM GOAL #1   Title  Pt to be ambulating in her home without her cane.     Baseline  01/04/18 - patient ambulating without AD    Time  2    Period  Weeks    Status   Achieved      PT SHORT TERM GOAL #2   Title  PT to be able to single leg stance on both feet for 15 seconds to reduce risk of falling  Baseline  01/04/18 - Lt LE = 7 seconds, Rt LE = 30 seconds    Time  3    Period  Weeks    Status  Partially Met      PT SHORT TERM GOAL #3   Title  Pt Rt LE strength to improve to allow pt to go up and down 6 steps in a reciprocal manner.     Baseline  11/23/17 - ascends/descend 12x 6" steps with 1 hand rail and step over step pattern    Status  Achieved        PT Long Term Goals - 01/04/18 1350      PT LONG TERM GOAL #1   Title  Pt to pain  in her right hip to be no greater than a 1/10 to allow pt to have no difficulty getting to sleep due to hip pain.     Baseline  12/01/17 -highest pain is a 2/10 or less, hip pain doesn't wake her up anymore, it is her knee pain    Period  Weeks    Status  Partially Met      PT LONG TERM GOAL #2   Title  Pt to be able to single leg stance for at least 30 seconds on both LE to allow pt to feel confident walking on uneven terrain without an assistive device.     Baseline  01/04/18 - Lt LE = 7 seconds, Rt LE = 30 seconds    Period  Weeks    Status  Partially Met      PT LONG TERM GOAL #3   Title  Pt strength in right hip increased one grade to allow pt to arise from a squatted position to pick items up off of the floor.    Baseline  11/23/17 - increase in MMT by 1 grade or more for alll groups tested    Time  4    Period  Weeks    Status  Achieved      PT LONG TERM GOAL #4   Title  Pt to be walking in and outside the house without an assistive device.    Baseline  11/23/17 - patient is walking without cane and only takes the cane if going out for safety but she denies using it. She uses sometimes in teh middle of night if getting up for the bathroom.    Time  4    Period  Weeks    Status  Achieved      PT LONG TERM GOAL #5   Title  PT to be working out at [employer] again to return to previous functional  level.    Baseline  11/23/17 - has gone 1 time to this point but is hoping to get into a regular routine.; 12/14/17 - participating in gym routine 3x/week    Time  4    Period  Weeks    Status  Achieved        Plan - 01/04/18 1350    Clinical Impression Statement  Re-assessment was performed today and patient has met/partially met 3/3 short term goals and 5/5 long term goals. She continues to demonstrate excellent Bil LE and has demonstrated improved SLS with the Rt LE. She was unable to improve on her Lt SLS and complained of knee pain as her primary limitation. Her FOTO score improved by 12% indicating a significant improvement in functional limitations. She has been educated on safe progression of gym activity and has  initiated self-care of scar massage and soft tissue mobilization. She reports feeling confident in progressing herself at the gym and managing independently. She will be discharged after today's session.    Rehab Potential  Good    PT Frequency  2x / week    PT Duration  3 weeks    PT Treatment/Interventions  Therapeutic exercise;Balance training;Gait training;Stair training;Functional mobility training;Therapeutic activities;Patient/family education    PT Next Visit Plan  discharge this session    PT Home Exercise Plan  EVAL:  sit to stand, SLS, bent knee raise, bridge and sidelying abduction; 11/23/17 - SLS with foo tback, tandem, marching at counter; 2/11: clams and sidestep with RTB    Consulted and Agree with Plan of Care  Patient       Patient will benefit from skilled therapeutic intervention in order to improve the following deficits and impairments:  Abnormal gait, Decreased activity tolerance, Decreased balance, Difficulty walking, Decreased strength, Pain  Visit Diagnosis: Pain in right hip  Difficulty in walking, not elsewhere classified  Muscle weakness (generalized)     Problem List Patient Active Problem List   Diagnosis Date Noted  . Primary  osteoarthritis of right hip 10/09/2017  . Osteoarthritis of right hip 10/04/2017  . Special screening for malignant neoplasms, colon 12/30/2016  . Breast cancer ([place]) 06/27/2011  . Hx Breast cancer, Right, multifocal IDC, receptor +, Her2 - 06/07/2006    Class: Stage 1  . Hx Breast cancer ([facility]), Right, Stage 0,  08/08/2001    Kipp Brood, PT, DPT Physical Therapist with [facility]  01/04/2018 2:51 PM    [facility] [address] [place], [place], [address] Phone: [phone]   Fax:  [phone]  Name: Haley Peterson MRN: [id number] Date of Birth: September 14, 1946

## 2018-01-12 ENCOUNTER — Telehealth (HOSPITAL_COMMUNITY): Payer: Self-pay

## 2018-01-12 NOTE — Telephone Encounter (Signed)
Pt came by with denials from insurance co and was worried about the bills. I called pt accounting they state account is on hold until the claims are worked out between [employer] and [employer]. Pt should not be charged for the entire bill for our services.

## 2018-02-27 DIAGNOSIS — Z09 Encounter for follow-up examination after completed treatment for conditions other than malignant neoplasm: Secondary | ICD-10-CM | POA: Diagnosis not present

## 2018-02-27 DIAGNOSIS — M25551 Pain in right hip: Secondary | ICD-10-CM | POA: Diagnosis not present

## 2018-02-27 DIAGNOSIS — Z96641 Presence of right artificial hip joint: Secondary | ICD-10-CM | POA: Diagnosis not present

## 2018-02-28 DIAGNOSIS — J060 Acute laryngopharyngitis: Secondary | ICD-10-CM | POA: Diagnosis not present

## 2018-05-16 DIAGNOSIS — I10 Essential (primary) hypertension: Secondary | ICD-10-CM | POA: Diagnosis not present

## 2018-05-16 DIAGNOSIS — J060 Acute laryngopharyngitis: Secondary | ICD-10-CM | POA: Diagnosis not present

## 2018-05-16 DIAGNOSIS — R7301 Impaired fasting glucose: Secondary | ICD-10-CM | POA: Diagnosis not present

## 2018-05-16 DIAGNOSIS — C50919 Malignant neoplasm of unspecified site of unspecified female breast: Secondary | ICD-10-CM | POA: Diagnosis not present

## 2018-05-16 DIAGNOSIS — E875 Hyperkalemia: Secondary | ICD-10-CM | POA: Diagnosis not present

## 2018-05-16 DIAGNOSIS — R944 Abnormal results of kidney function studies: Secondary | ICD-10-CM | POA: Diagnosis not present

## 2018-05-16 DIAGNOSIS — M779 Enthesopathy, unspecified: Secondary | ICD-10-CM | POA: Diagnosis not present

## 2018-05-23 DIAGNOSIS — Z6834 Body mass index (BMI) 34.0-34.9, adult: Secondary | ICD-10-CM | POA: Diagnosis not present

## 2018-05-23 DIAGNOSIS — I10 Essential (primary) hypertension: Secondary | ICD-10-CM | POA: Diagnosis not present

## 2018-05-23 DIAGNOSIS — R944 Abnormal results of kidney function studies: Secondary | ICD-10-CM | POA: Diagnosis not present

## 2018-05-23 DIAGNOSIS — C50919 Malignant neoplasm of unspecified site of unspecified female breast: Secondary | ICD-10-CM | POA: Diagnosis not present

## 2018-05-23 DIAGNOSIS — R7301 Impaired fasting glucose: Secondary | ICD-10-CM | POA: Diagnosis not present

## 2018-05-23 DIAGNOSIS — M170 Bilateral primary osteoarthritis of knee: Secondary | ICD-10-CM | POA: Diagnosis not present

## 2018-05-23 DIAGNOSIS — M160 Bilateral primary osteoarthritis of hip: Secondary | ICD-10-CM | POA: Diagnosis not present

## 2018-05-23 DIAGNOSIS — E875 Hyperkalemia: Secondary | ICD-10-CM | POA: Diagnosis not present

## 2018-07-06 ENCOUNTER — Other Ambulatory Visit: Payer: Self-pay | Admitting: Internal Medicine

## 2018-07-06 DIAGNOSIS — Z1231 Encounter for screening mammogram for malignant neoplasm of breast: Secondary | ICD-10-CM

## 2018-08-02 IMAGING — RF DG C-ARM 61-120 MIN
1 series · 2 of 2 positions shown · non-contrast
Comparison: None.

CLINICAL DATA: Right anterior total hip arthroplasty

EXAM:
OPERATIVE RIGHT HIP (WITH PELVIS IF PERFORMED) 2 VIEWS
TECHNIQUE: Fluoroscopic spot image(s) were submitted for interpretation
post-operatively.

[Series 1: run · 2 of 2 slices shown]
[im 1/2]
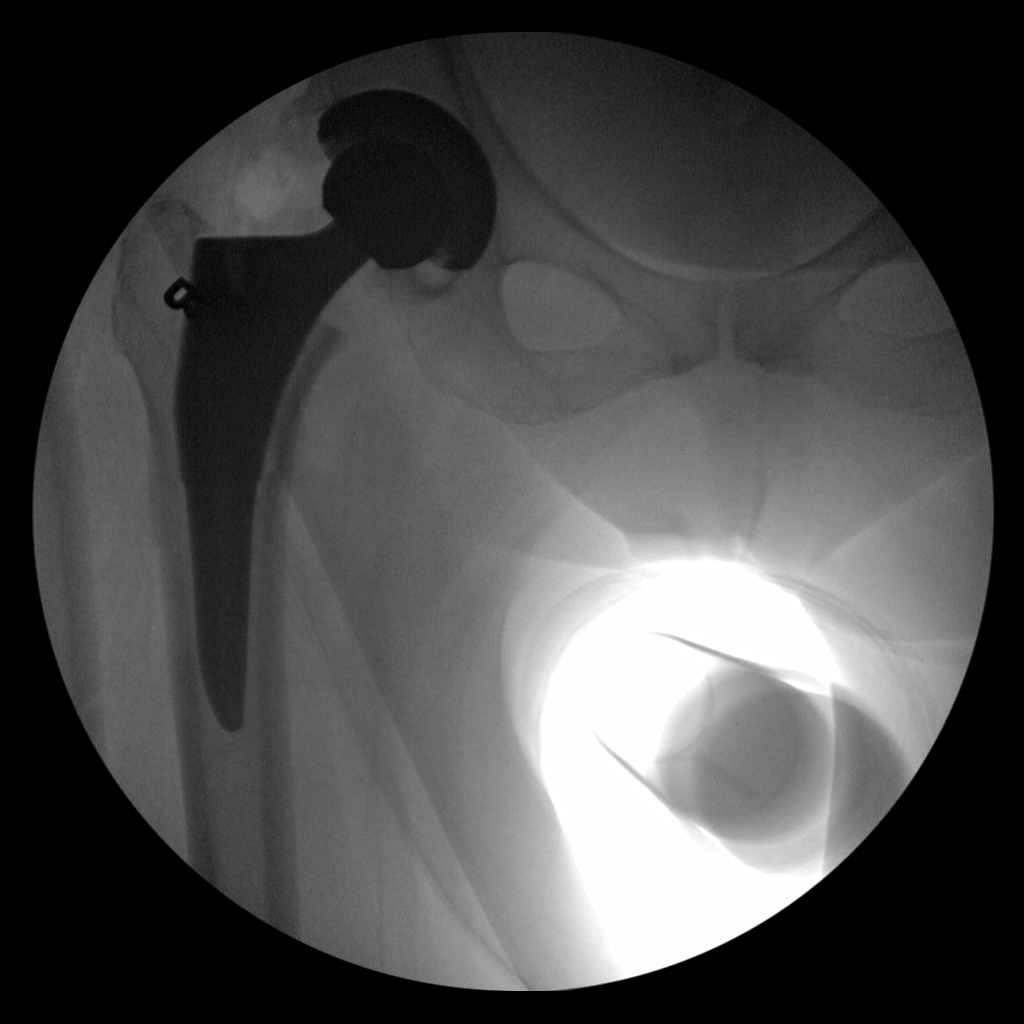
[im 2/2]
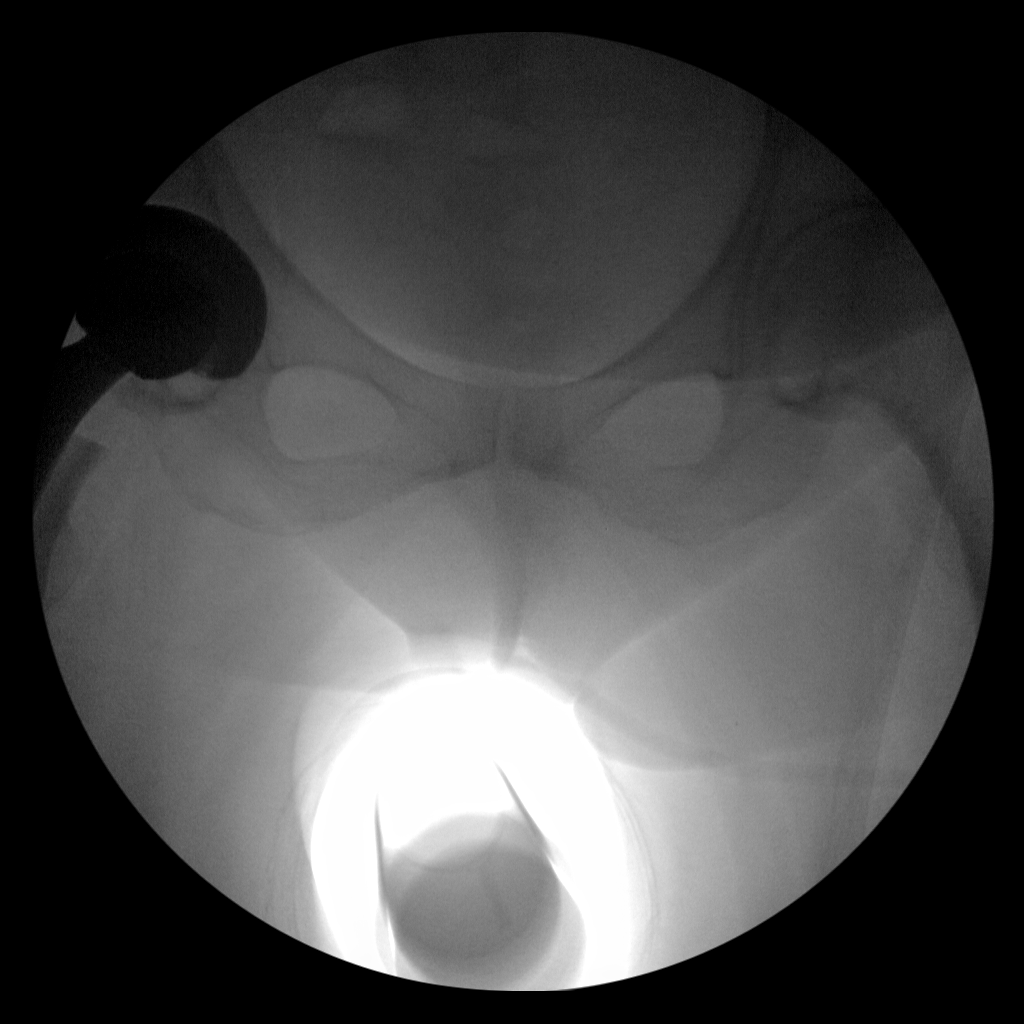

[2 of 2 positions shown; findings below may reference images not displayed]

FINDINGS: Changes of right hip replacement. No visible hardware or bony
complicating feature. Normal AP alignment.
IMPRESSION: Right hip replacement.  No visible complicating feature.

## 2018-08-06 DIAGNOSIS — R7301 Impaired fasting glucose: Secondary | ICD-10-CM | POA: Diagnosis not present

## 2018-08-06 DIAGNOSIS — I10 Essential (primary) hypertension: Secondary | ICD-10-CM | POA: Diagnosis not present

## 2018-08-06 DIAGNOSIS — E875 Hyperkalemia: Secondary | ICD-10-CM | POA: Diagnosis not present

## 2018-08-06 DIAGNOSIS — R944 Abnormal results of kidney function studies: Secondary | ICD-10-CM | POA: Diagnosis not present

## 2018-08-06 DIAGNOSIS — J060 Acute laryngopharyngitis: Secondary | ICD-10-CM | POA: Diagnosis not present

## 2018-08-13 DIAGNOSIS — I10 Essential (primary) hypertension: Secondary | ICD-10-CM | POA: Diagnosis not present

## 2018-08-20 ENCOUNTER — Ambulatory Visit
Admission: RE | Admit: 2018-08-20 | Discharge: 2018-08-20 | Disposition: A | Discharge status: Home / Self Care | Payer: PPO | Source: Ambulatory Visit | Attending: Internal Medicine | Admitting: Internal Medicine

## 2018-08-20 DIAGNOSIS — Z1231 Encounter for screening mammogram for malignant neoplasm of breast: Secondary | ICD-10-CM | POA: Diagnosis not present

## 2018-08-29 DIAGNOSIS — M25561 Pain in right knee: Secondary | ICD-10-CM | POA: Diagnosis not present

## 2018-08-29 DIAGNOSIS — M1612 Unilateral primary osteoarthritis, left hip: Secondary | ICD-10-CM | POA: Diagnosis not present

## 2018-08-29 DIAGNOSIS — M25562 Pain in left knee: Secondary | ICD-10-CM | POA: Diagnosis not present

## 2018-08-30 DIAGNOSIS — C50919 Malignant neoplasm of unspecified site of unspecified female breast: Secondary | ICD-10-CM | POA: Diagnosis not present

## 2018-08-30 DIAGNOSIS — J060 Acute laryngopharyngitis: Secondary | ICD-10-CM | POA: Diagnosis not present

## 2018-08-30 DIAGNOSIS — E875 Hyperkalemia: Secondary | ICD-10-CM | POA: Diagnosis not present

## 2018-08-30 DIAGNOSIS — Z6834 Body mass index (BMI) 34.0-34.9, adult: Secondary | ICD-10-CM | POA: Diagnosis not present

## 2018-08-30 DIAGNOSIS — M160 Bilateral primary osteoarthritis of hip: Secondary | ICD-10-CM | POA: Diagnosis not present

## 2018-08-30 DIAGNOSIS — R7301 Impaired fasting glucose: Secondary | ICD-10-CM | POA: Diagnosis not present

## 2018-08-30 DIAGNOSIS — I10 Essential (primary) hypertension: Secondary | ICD-10-CM | POA: Diagnosis not present

## 2018-08-30 DIAGNOSIS — R944 Abnormal results of kidney function studies: Secondary | ICD-10-CM | POA: Diagnosis not present

## 2018-08-30 DIAGNOSIS — M779 Enthesopathy, unspecified: Secondary | ICD-10-CM | POA: Diagnosis not present

## 2018-08-30 DIAGNOSIS — M170 Bilateral primary osteoarthritis of knee: Secondary | ICD-10-CM | POA: Diagnosis not present

## 2018-09-04 DIAGNOSIS — N183 Chronic kidney disease, stage 3 (moderate): Secondary | ICD-10-CM | POA: Diagnosis not present

## 2018-09-04 DIAGNOSIS — C50919 Malignant neoplasm of unspecified site of unspecified female breast: Secondary | ICD-10-CM | POA: Diagnosis not present

## 2018-09-04 DIAGNOSIS — Z0000 Encounter for general adult medical examination without abnormal findings: Secondary | ICD-10-CM | POA: Diagnosis not present

## 2018-09-04 DIAGNOSIS — R7301 Impaired fasting glucose: Secondary | ICD-10-CM | POA: Diagnosis not present

## 2018-09-04 DIAGNOSIS — I10 Essential (primary) hypertension: Secondary | ICD-10-CM | POA: Diagnosis not present

## 2018-09-04 DIAGNOSIS — Z6836 Body mass index (BMI) 36.0-36.9, adult: Secondary | ICD-10-CM | POA: Diagnosis not present

## 2018-09-04 DIAGNOSIS — M1710 Unilateral primary osteoarthritis, unspecified knee: Secondary | ICD-10-CM | POA: Diagnosis not present

## 2018-09-11 DIAGNOSIS — L03116 Cellulitis of left lower limb: Secondary | ICD-10-CM | POA: Diagnosis not present

## 2018-09-11 DIAGNOSIS — L298 Other pruritus: Secondary | ICD-10-CM | POA: Diagnosis not present

## 2018-09-11 DIAGNOSIS — I872 Venous insufficiency (chronic) (peripheral): Secondary | ICD-10-CM | POA: Diagnosis not present

## 2018-09-11 DIAGNOSIS — L03115 Cellulitis of right lower limb: Secondary | ICD-10-CM | POA: Diagnosis not present

## 2018-09-25 DIAGNOSIS — N183 Chronic kidney disease, stage 3 (moderate): Secondary | ICD-10-CM | POA: Diagnosis not present

## 2018-09-25 DIAGNOSIS — R7301 Impaired fasting glucose: Secondary | ICD-10-CM | POA: Diagnosis not present

## 2018-09-25 DIAGNOSIS — I10 Essential (primary) hypertension: Secondary | ICD-10-CM | POA: Diagnosis not present

## 2018-09-25 DIAGNOSIS — C50919 Malignant neoplasm of unspecified site of unspecified female breast: Secondary | ICD-10-CM | POA: Diagnosis not present

## 2018-09-25 DIAGNOSIS — I872 Venous insufficiency (chronic) (peripheral): Secondary | ICD-10-CM | POA: Diagnosis not present

## 2018-09-28 ENCOUNTER — Other Ambulatory Visit: Payer: Self-pay | Admitting: Orthopedic Surgery

## 2018-10-15 ENCOUNTER — Encounter (HOSPITAL_COMMUNITY): Payer: Self-pay

## 2018-10-15 ENCOUNTER — Inpatient Hospital Stay (HOSPITAL_COMMUNITY): Admit: 2018-10-15 | Payer: PPO | Admitting: Orthopedic Surgery

## 2018-10-15 DIAGNOSIS — N183 Chronic kidney disease, stage 3 (moderate): Secondary | ICD-10-CM | POA: Diagnosis not present

## 2018-10-15 DIAGNOSIS — R7301 Impaired fasting glucose: Secondary | ICD-10-CM | POA: Diagnosis not present

## 2018-10-15 DIAGNOSIS — I10 Essential (primary) hypertension: Secondary | ICD-10-CM | POA: Diagnosis not present

## 2018-10-15 DIAGNOSIS — C50919 Malignant neoplasm of unspecified site of unspecified female breast: Secondary | ICD-10-CM | POA: Diagnosis not present

## 2018-10-15 SURGERY — ARTHROPLASTY, HIP, TOTAL, ANTERIOR APPROACH
Anesthesia: Spinal | Laterality: Left

## 2018-12-24 DIAGNOSIS — J060 Acute laryngopharyngitis: Secondary | ICD-10-CM | POA: Diagnosis not present

## 2018-12-24 DIAGNOSIS — B379 Candidiasis, unspecified: Secondary | ICD-10-CM | POA: Diagnosis not present

## 2018-12-31 ENCOUNTER — Encounter (HOSPITAL_COMMUNITY): Admission: RE | Payer: Self-pay | Source: Home / Self Care

## 2018-12-31 ENCOUNTER — Inpatient Hospital Stay (HOSPITAL_COMMUNITY): Admission: RE | Admit: 2018-12-31 | Payer: PPO | Source: Home / Self Care | Admitting: Orthopedic Surgery

## 2018-12-31 SURGERY — ARTHROPLASTY, HIP, TOTAL, ANTERIOR APPROACH
Anesthesia: Spinal | Laterality: Left

## 2019-03-29 DIAGNOSIS — Z0000 Encounter for general adult medical examination without abnormal findings: Secondary | ICD-10-CM | POA: Diagnosis not present

## 2019-04-23 ENCOUNTER — Other Ambulatory Visit: Payer: Self-pay | Admitting: Orthopedic Surgery

## 2019-05-06 DIAGNOSIS — R944 Abnormal results of kidney function studies: Secondary | ICD-10-CM | POA: Diagnosis not present

## 2019-05-06 DIAGNOSIS — I10 Essential (primary) hypertension: Secondary | ICD-10-CM | POA: Diagnosis not present

## 2019-05-06 DIAGNOSIS — N183 Chronic kidney disease, stage 3 (moderate): Secondary | ICD-10-CM | POA: Diagnosis not present

## 2019-05-06 DIAGNOSIS — R7301 Impaired fasting glucose: Secondary | ICD-10-CM | POA: Diagnosis not present

## 2019-05-07 DIAGNOSIS — N183 Chronic kidney disease, stage 3 (moderate): Secondary | ICD-10-CM | POA: Diagnosis not present

## 2019-05-07 DIAGNOSIS — I10 Essential (primary) hypertension: Secondary | ICD-10-CM | POA: Diagnosis not present

## 2019-05-07 DIAGNOSIS — M1710 Unilateral primary osteoarthritis, unspecified knee: Secondary | ICD-10-CM | POA: Diagnosis not present

## 2019-05-07 DIAGNOSIS — R7301 Impaired fasting glucose: Secondary | ICD-10-CM | POA: Diagnosis not present

## 2019-05-07 DIAGNOSIS — Z6836 Body mass index (BMI) 36.0-36.9, adult: Secondary | ICD-10-CM | POA: Diagnosis not present

## 2019-05-07 DIAGNOSIS — C50919 Malignant neoplasm of unspecified site of unspecified female breast: Secondary | ICD-10-CM | POA: Diagnosis not present

## 2019-05-21 ENCOUNTER — Other Ambulatory Visit (HOSPITAL_COMMUNITY): Payer: Self-pay | Admitting: *Deleted

## 2019-05-21 NOTE — Patient Instructions (Addendum)
YOU HAD A COVID 19 TEST ON 05/23/2019 @ 115 PM,  ONCE YOUR COVID TEST IS COMPLETED, PLEASE BEGIN THE QUARANTINE INSTRUCTIONS AS OUTLINED IN YOUR HANDOUT.                Mayara TAJHA SAMMARCO    Your procedure is scheduled on: 05-27-2019   Report to [facility]  Entrance  Report to admitting at 10:25 AM      Call this number if you have problems the morning of surgery [phone]     Remember: [employer], NO [place].   NO SOLID FOOD AFTER MIDNIGHT THE NIGHT PRIOR TO SURGERY.  NOTHING BY MOUTH EXCEPT CLEAR LIQUIDS UNTIL 9:55 AM.   PLEASE FINISH ENSURE DRINK PER SURGEON ORDER 3 HOURS PRIOR TO SCHEDULED SURGERY TIME WHICH NEEDS TO BE COMPLETED AT 9:55 AM.   CLEAR LIQUID DIET   Foods Allowed                                                                     Foods Excluded  Coffee and tea, regular and decaf                             liquids that you cannot  Plain Jell-O in any flavor                                             see through such as: Fruit ices (not with fruit pulp)                                     milk, soups, orange juice  Iced Popsicles                                    All solid food Carbonated beverages, regular and diet                                    Cranberry, grape and apple juices Sports drinks like Gatorade Lightly seasoned clear broth or consume(fat free) Sugar, honey syrup  Sample Menu Breakfast                                Lunch                                     Supper Cranberry juice                    Beef broth                            Chicken broth  Jell-O                                     Grape juice                           Apple juice Coffee or tea                        Jell-O                                      Popsicle                                                Coffee or tea                        Coffee or  tea  _____________________________________________________________________     Take these medicines the morning of surgery with A SIP OF WATER: None                                You may not have any metal on your body including hair pins and              piercings              Do not wear jewelry, make-up, lotions, powders or perfumes, deodorant             Do not wear nail polish.  Do not shave  48 hours prior to surgery.                Do not bring valuables to the hospital. [place].  Contacts, dentures or bridgework may not be worn into surgery.       _____________________________________________________________________             [facility] - Preparing for Surgery Before surgery, you can play an important role.   Because skin is not sterile, your skin needs to be as free of germs as possible.   You can reduce the number of germs on your skin by washing with CHG (chlorahexidine gluconate) soap before surgery.   CHG is an antiseptic cleaner which kills germs and bonds with the skin to continue killing germs even after washing. Please DO NOT use if you have an allergy to CHG or antibacterial soaps.   If your skin becomes reddened/irritated stop using the CHG and inform your nurse when you arrive at Short Stay. Do not shave (including legs and underarms) for at least 48 hours prior to the first CHG shower.   Please follow these instructions carefully:  1.  Shower with CHG Soap the night before surgery and the  morning of Surgery.  2.  If you choose to wash your hair, wash your hair first as usual with your  normal  shampoo.  3.  After you shampoo, rinse your hair and body thoroughly to remove the  shampoo.  4.  Use CHG as you would any other liquid soap.  You can apply chg directly  to the skin and wash                       Gently with a scrungie or clean washcloth.  5.  Apply the  CHG Soap to your body ONLY FROM THE NECK DOWN.   Do not use on face/ open                           Wound or open sores. Avoid contact with eyes, ears mouth and genitals (private parts).                       Wash face,  Genitals (private parts) with your normal soap.             6.  Wash thoroughly, paying special attention to the area where your surgery  will be performed.  7.  Thoroughly rinse your body with warm water from the neck down.  8.  DO NOT shower/wash with your normal soap after using and rinsing off  the CHG Soap.                9.  Pat yourself dry with a clean towel.            10.  Wear clean pajamas.            11.  Place clean sheets on your bed the night of your first shower and do not  sleep with pets. Day of Surgery : Do not apply any lotions/deodorants the morning of surgery.  Please wear clean clothes to the hospital/surgery center.  FAILURE TO FOLLOW THESE INSTRUCTIONS MAY RESULT IN THE CANCELLATION OF YOUR SURGERY PATIENT SIGNATURE_________________________________  NURSE SIGNATURE__________________________________  ________________________________________________________________________   [name]  An incentive spirometer is a tool that can help keep your lungs clear and active. This tool measures how well you are filling your lungs with each breath. Taking long deep breaths may help reverse or decrease the chance of developing breathing (pulmonary) problems (especially infection) following:  A long period of time when you are unable to move or be active. BEFORE THE PROCEDURE   If the spirometer includes an indicator to show your best effort, your nurse or respiratory therapist will set it to a desired goal.  If possible, sit up straight or lean slightly forward. Try not to slouch.  Hold the incentive spirometer in an upright position. INSTRUCTIONS FOR USE  1. Sit on the edge of your bed if possible, or sit up as far as you can in bed or on a  chair. 2. Hold the incentive spirometer in an upright position. 3. Breathe out normally. 4. Place the mouthpiece in your mouth and seal your lips tightly around it. 5. Breathe in slowly and as deeply as possible, raising the piston or the ball toward the top of the column. 6. Hold your breath for 3-5 seconds or for as long as possible. Allow the piston or ball to fall to the bottom of the column. 7. Remove the mouthpiece from your mouth and breathe out normally. 8. Rest for a few seconds and repeat Steps 1 through 7 at least 10 times every 1-2 hours when you are awake. Take your time and take a few normal breaths between deep breaths. 9. The spirometer may include an indicator to  show your best effort. Use the indicator as a goal to work toward during each repetition. 10. After each set of 10 deep breaths, practice coughing to be sure your lungs are clear. If you have an incision (the cut made at the time of surgery), support your incision when coughing by placing a pillow or rolled up towels firmly against it. Once you are able to get out of bed, walk around indoors and cough well. You may stop using the incentive spirometer when instructed by your caregiver.  RISKS AND COMPLICATIONS  Take your time so you do not get dizzy or light-headed.  If you are in pain, you may need to take or ask for pain medication before doing incentive spirometry. It is harder to take a deep breath if you are having pain. AFTER USE  Rest and breathe slowly and easily.  It can be helpful to keep track of a log of your progress. Your caregiver can provide you with a simple table to help with this. If you are using the spirometer at home, follow these instructions: [place] IF:   You are having difficultly using the spirometer.  You have trouble using the spirometer as often as instructed.  Your pain medication is not giving enough relief while using the spirometer.  You develop fever of 100.5 F  (38.1 C) or higher. SEEK IMMEDIATE MEDICAL CARE IF:   You cough up bloody sputum that had not been present before.  You develop fever of 102 F (38.9 C) or greater.  You develop worsening pain at or near the incision site. MAKE SURE YOU:   Understand these instructions.  Will watch your condition.  Will get help right away if you are not doing well or get worse. Document Released: 03/13/2007 Document Revised: 01/23/2012 Document Reviewed: 05/14/2007 ExitCare Patient Information 2014 ExitCare, [place].   ________________________________________________________________________  WHAT IS A BLOOD TRANSFUSION? Blood Transfusion Information  A transfusion is the replacement of blood or some of its parts. Blood is made up of multiple cells which provide different functions.  Red blood cells carry oxygen and are used for blood loss replacement.  White blood cells fight against infection.  Platelets control bleeding.  Plasma helps clot blood.  Other blood products are available for specialized needs, such as hemophilia or other clotting disorders. BEFORE THE TRANSFUSION  Who gives blood for transfusions?   Healthy volunteers who are fully evaluated to make sure their blood is safe. This is blood bank blood. Transfusion therapy is the safest it has ever been in the practice of medicine. Before blood is taken from a donor, a complete history is taken to make sure that person has no history of diseases nor engages in risky social behavior (examples are intravenous drug use or sexual activity with multiple partners). The donor's travel history is screened to minimize risk of transmitting infections, such as malaria. The donated blood is tested for signs of infectious diseases, such as HIV and hepatitis. The blood is then tested to be sure it is compatible with you in order to minimize the chance of a transfusion reaction. If you or a relative donates blood, this is often done in anticipation  of surgery and is not appropriate for emergency situations. It takes many days to process the donated blood. RISKS AND COMPLICATIONS Although transfusion therapy is very safe and saves many lives, the main dangers of transfusion include:   Getting an infectious disease.  Developing a transfusion reaction. This is an allergic reaction  to something in the blood you were given. Every precaution is taken to prevent this. The decision to have a blood transfusion has been considered carefully by your caregiver before blood is given. Blood is not given unless the benefits outweigh the risks. AFTER THE TRANSFUSION  Right after receiving a blood transfusion, you will usually feel much better and more energetic. This is especially true if your red blood cells have gotten low (anemic). The transfusion raises the level of the red blood cells which carry oxygen, and this usually causes an energy increase.  The nurse administering the transfusion will monitor you carefully for complications. HOME CARE INSTRUCTIONS  No special instructions are needed after a transfusion. You may find your energy is better. Speak with your caregiver about any limitations on activity for underlying diseases you may have. SEEK MEDICAL CARE IF:   Your condition is not improving after your transfusion.  You develop redness or irritation at the intravenous (IV) site. SEEK IMMEDIATE MEDICAL CARE IF:  Any of the following symptoms occur over the next 12 hours:  Shaking chills.  You have a temperature by mouth above 102 F (38.9 C), not controlled by medicine.  Chest, back, or muscle pain.  People around you feel you are not acting correctly or are confused.  Shortness of breath or difficulty breathing.  Dizziness and fainting.  You get a rash or develop hives.  You have a decrease in urine output.  Your urine turns a dark color or changes to pink, red, or brown. Any of the following symptoms occur over the next 10  days:  You have a temperature by mouth above 102 F (38.9 C), not controlled by medicine.  Shortness of breath.  Weakness after normal activity.  The white part of the eye turns yellow (jaundice).  You have a decrease in the amount of urine or are urinating less often.  Your urine turns a dark color or changes to pink, red, or brown. Document Released: 10/28/2000 Document Revised: 01/23/2012 Document Reviewed: 06/16/2008 [facility] Patient Information 2014 [place], [place].  _______________________________________________________________________

## 2019-05-23 ENCOUNTER — Other Ambulatory Visit: Payer: Self-pay

## 2019-05-23 ENCOUNTER — Ambulatory Visit (HOSPITAL_COMMUNITY)
Admission: RE | Admit: 2019-05-23 | Discharge: 2019-05-23 | Disposition: A | Discharge status: Home / Self Care | Payer: PPO | Source: Ambulatory Visit | Attending: Orthopedic Surgery | Admitting: Orthopedic Surgery

## 2019-05-23 ENCOUNTER — Encounter (HOSPITAL_COMMUNITY): Payer: Self-pay

## 2019-05-23 ENCOUNTER — Encounter (HOSPITAL_COMMUNITY)
Admission: RE | Admit: 2019-05-23 | Discharge: 2019-05-23 | Disposition: A | Discharge status: Home / Self Care | Payer: PPO | Source: Ambulatory Visit | Attending: Orthopedic Surgery | Admitting: Orthopedic Surgery

## 2019-05-23 ENCOUNTER — Encounter (HOSPITAL_COMMUNITY): Payer: Self-pay | Admitting: Physician Assistant

## 2019-05-23 ENCOUNTER — Other Ambulatory Visit (HOSPITAL_COMMUNITY)
Admission: RE | Admit: 2019-05-23 | Discharge: 2019-05-23 | Disposition: A | Discharge status: Home / Self Care | Payer: PPO | Source: Ambulatory Visit | Attending: Orthopedic Surgery | Admitting: Orthopedic Surgery

## 2019-05-23 DIAGNOSIS — Z01818 Encounter for other preprocedural examination: Secondary | ICD-10-CM | POA: Diagnosis not present

## 2019-05-23 DIAGNOSIS — R9431 Abnormal electrocardiogram [ECG] [EKG]: Secondary | ICD-10-CM | POA: Insufficient documentation

## 2019-05-23 DIAGNOSIS — I10 Essential (primary) hypertension: Secondary | ICD-10-CM | POA: Diagnosis not present

## 2019-05-23 DIAGNOSIS — M1612 Unilateral primary osteoarthritis, left hip: Secondary | ICD-10-CM | POA: Insufficient documentation

## 2019-05-23 DIAGNOSIS — R000 Tachycardia, unspecified: Secondary | ICD-10-CM | POA: Insufficient documentation

## 2019-05-23 DIAGNOSIS — Z1159 Encounter for screening for other viral diseases: Secondary | ICD-10-CM | POA: Insufficient documentation

## 2019-05-23 LAB — CBC WITH DIFFERENTIAL/PLATELET
Abs Immature Granulocytes: 0.02 10*3/uL (ref 0.00–0.07)
Basophils Absolute: 0.1 10*3/uL (ref 0.0–0.1)
Basophils Relative: 1 %
Eosinophils Absolute: 0.3 10*3/uL (ref 0.0–0.5)
Eosinophils Relative: 2 %
HCT: 41.9 % (ref 36.0–46.0)
Hemoglobin: 12.9 g/dL (ref 12.0–15.0)
Immature Granulocytes: 0 %
Lymphocytes Relative: 16 %
Lymphs Abs: 1.8 10*3/uL (ref 0.7–4.0)
MCH: 28.1 pg (ref 26.0–34.0)
MCHC: 30.8 g/dL (ref 30.0–36.0)
MCV: 91.3 fL (ref 80.0–100.0)
Monocytes Absolute: 0.9 10*3/uL (ref 0.1–1.0)
Monocytes Relative: 8 %
Neutro Abs: 8.4 10*3/uL — ABNORMAL HIGH (ref 1.7–7.7)
Neutrophils Relative %: 73 %
Platelets: 385 10*3/uL (ref 150–400)
RBC: 4.59 MIL/uL (ref 3.87–5.11)
RDW: 13.4 % (ref 11.5–15.5)
WBC: 11.5 10*3/uL — ABNORMAL HIGH (ref 4.0–10.5)
nRBC: 0 % (ref 0.0–0.2)

## 2019-05-23 LAB — BASIC METABOLIC PANEL
Anion gap: 10 (ref 5–15)
BUN: 31 mg/dL — ABNORMAL HIGH (ref 8–23)
CO2: 25 mmol/L (ref 22–32)
Calcium: 9.3 mg/dL (ref 8.9–10.3)
Chloride: 104 mmol/L (ref 98–111)
Creatinine, Ser: 1.48 mg/dL — ABNORMAL HIGH (ref 0.44–1.00)
GFR calc Af Amer: 40 mL/min — ABNORMAL LOW (ref >60)
GFR calc non Af Amer: 35 mL/min — ABNORMAL LOW (ref >60)
Glucose, Bld: 93 mg/dL (ref 70–99)
Potassium: 4.3 mmol/L (ref 3.5–5.1)
Sodium: 139 mmol/L (ref 135–145)

## 2019-05-23 LAB — URINALYSIS, ROUTINE W REFLEX MICROSCOPIC
Bilirubin Urine: NEGATIVE
Glucose, UA: NEGATIVE mg/dL
Hgb urine dipstick: NEGATIVE
Ketones, ur: NEGATIVE mg/dL
Nitrite: NEGATIVE
Protein, ur: NEGATIVE mg/dL
Specific Gravity, Urine: 1.013 (ref 1.005–1.030)
pH: 7 (ref 5.0–8.0)

## 2019-05-23 LAB — SURGICAL PCR SCREEN
MRSA, PCR: NEGATIVE
Staphylococcus aureus: NEGATIVE

## 2019-05-23 LAB — PROTIME-INR
INR: 1 (ref 0.8–1.2)
Prothrombin Time: 13.4 seconds (ref 11.4–15.2)

## 2019-05-23 LAB — APTT: aPTT: 31 seconds (ref 24–36)

## 2019-05-24 LAB — SARS CORONAVIRUS 2 (TAT 6-24 HRS): SARS Coronavirus 2: NEGATIVE

## 2019-05-24 LAB — ABO/RH: ABO/RH(D): A POS

## 2019-05-27 ENCOUNTER — Ambulatory Visit (HOSPITAL_COMMUNITY): Admission: RE | Admit: 2019-05-27 | Payer: PPO | Source: Home / Self Care | Admitting: Orthopedic Surgery

## 2019-05-27 ENCOUNTER — Encounter (HOSPITAL_COMMUNITY): Admission: RE | Payer: Self-pay | Source: Home / Self Care

## 2019-05-27 LAB — TYPE AND SCREEN
ABO/RH(D): A POS
Antibody Screen: NEGATIVE

## 2019-05-27 SURGERY — ARTHROPLASTY, HIP, TOTAL, ANTERIOR APPROACH
Anesthesia: Spinal | Laterality: Left

## 2019-05-29 DIAGNOSIS — E669 Obesity, unspecified: Secondary | ICD-10-CM | POA: Diagnosis not present

## 2019-05-29 DIAGNOSIS — Z136 Encounter for screening for cardiovascular disorders: Secondary | ICD-10-CM | POA: Diagnosis not present

## 2019-05-29 DIAGNOSIS — R9431 Abnormal electrocardiogram [ECG] [EKG]: Secondary | ICD-10-CM | POA: Diagnosis not present

## 2019-05-29 DIAGNOSIS — Z96641 Presence of right artificial hip joint: Secondary | ICD-10-CM | POA: Diagnosis not present

## 2019-05-29 DIAGNOSIS — Z6836 Body mass index (BMI) 36.0-36.9, adult: Secondary | ICD-10-CM | POA: Diagnosis not present

## 2019-05-29 DIAGNOSIS — N183 Chronic kidney disease, stage 3 (moderate): Secondary | ICD-10-CM | POA: Diagnosis not present

## 2019-05-29 DIAGNOSIS — R000 Tachycardia, unspecified: Secondary | ICD-10-CM | POA: Diagnosis not present

## 2019-05-29 DIAGNOSIS — M1710 Unilateral primary osteoarthritis, unspecified knee: Secondary | ICD-10-CM | POA: Diagnosis not present

## 2019-05-29 DIAGNOSIS — Z96659 Presence of unspecified artificial knee joint: Secondary | ICD-10-CM | POA: Diagnosis not present

## 2019-05-29 DIAGNOSIS — I10 Essential (primary) hypertension: Secondary | ICD-10-CM | POA: Diagnosis not present

## 2019-06-12 DIAGNOSIS — M170 Bilateral primary osteoarthritis of knee: Secondary | ICD-10-CM | POA: Diagnosis not present

## 2019-06-12 DIAGNOSIS — Z96659 Presence of unspecified artificial knee joint: Secondary | ICD-10-CM | POA: Diagnosis not present

## 2019-06-12 DIAGNOSIS — R000 Tachycardia, unspecified: Secondary | ICD-10-CM | POA: Diagnosis not present

## 2019-06-12 DIAGNOSIS — N183 Chronic kidney disease, stage 3 (moderate): Secondary | ICD-10-CM | POA: Diagnosis not present

## 2019-06-12 DIAGNOSIS — R0600 Dyspnea, unspecified: Secondary | ICD-10-CM | POA: Diagnosis not present

## 2019-06-12 DIAGNOSIS — M160 Bilateral primary osteoarthritis of hip: Secondary | ICD-10-CM | POA: Diagnosis not present

## 2019-06-12 DIAGNOSIS — J060 Acute laryngopharyngitis: Secondary | ICD-10-CM | POA: Diagnosis not present

## 2019-06-12 DIAGNOSIS — Z96641 Presence of right artificial hip joint: Secondary | ICD-10-CM | POA: Diagnosis not present

## 2019-06-12 DIAGNOSIS — Z6834 Body mass index (BMI) 34.0-34.9, adult: Secondary | ICD-10-CM | POA: Diagnosis not present

## 2019-06-12 DIAGNOSIS — E669 Obesity, unspecified: Secondary | ICD-10-CM | POA: Diagnosis not present

## 2019-06-12 DIAGNOSIS — I10 Essential (primary) hypertension: Secondary | ICD-10-CM | POA: Diagnosis not present

## 2019-06-12 DIAGNOSIS — M1710 Unilateral primary osteoarthritis, unspecified knee: Secondary | ICD-10-CM | POA: Diagnosis not present

## 2019-06-12 DIAGNOSIS — Z0000 Encounter for general adult medical examination without abnormal findings: Secondary | ICD-10-CM | POA: Diagnosis not present

## 2019-06-12 DIAGNOSIS — Z136 Encounter for screening for cardiovascular disorders: Secondary | ICD-10-CM | POA: Diagnosis not present

## 2019-06-12 DIAGNOSIS — R9431 Abnormal electrocardiogram [ECG] [EKG]: Secondary | ICD-10-CM | POA: Diagnosis not present

## 2019-07-11 ENCOUNTER — Other Ambulatory Visit: Payer: Self-pay | Admitting: Internal Medicine

## 2019-07-11 DIAGNOSIS — Z1231 Encounter for screening mammogram for malignant neoplasm of breast: Secondary | ICD-10-CM

## 2019-07-15 ENCOUNTER — Ambulatory Visit: Payer: PPO | Admitting: Cardiology

## 2019-07-18 ENCOUNTER — Other Ambulatory Visit: Payer: Self-pay

## 2019-07-18 ENCOUNTER — Encounter

## 2019-07-18 ENCOUNTER — Encounter: Payer: Self-pay | Admitting: Cardiovascular Disease

## 2019-07-18 ENCOUNTER — Ambulatory Visit: Payer: PPO | Admitting: Cardiovascular Disease

## 2019-07-18 VITALS — BP 132/72 | HR 101 | Temp 97.8°F | Ht 64.0 in | Wt 203.0 lb

## 2019-07-18 DIAGNOSIS — Z01818 Encounter for other preprocedural examination: Secondary | ICD-10-CM

## 2019-07-18 DIAGNOSIS — R0602 Shortness of breath: Secondary | ICD-10-CM

## 2019-07-18 NOTE — Progress Notes (Signed)
CARDIOLOGY CONSULT NOTE  Patient ID: AUTIE WESTENBERGER MRN: [id number] DOB/AGE: 1945-12-21 73 y.o.  Admit date: (Not on file) Primary Physician: Celene Squibb, MD  Reason for Consultation: Abnormal EKG, Preoperative risk stratification  HPI: Haley Peterson is a 73 y.o. female who is being seen today for the evaluation of abnormal EKG and Preoperative risk stratification at the request of Celene Squibb, MD.    Past medical history includes hypertension chronic kidney disease stage III.  I reviewed labs from 05/06/2019: Hemoglobin 13.9, BUN 29, creatinine 1.37, total cholesterol 178, triglycerides 78, HDL 61, LDL 101, A1c 5.6%.  I personally reviewed an ECG performed on 05/23/2019 which demonstrated sinus tachycardia and nonspecific ST segment deviation, 101 bpm.  She is being scheduled for left hip replacement surgery.  She underwent right hip replacement surgery without event about 2 years ago and had both knees replaced several years ago.  She denies exertional chest pain.  She has some shortness of breath when walking to her mailbox and this is been going on for years.  When she rests she recovers fairly quickly.  She denies leg swelling, orthopnea, and paroxysmal nocturnal dyspnea.   Allergies  Allergen Reactions  . Penicillins Other (See Comments)    "LOSS OF VISION"  Has patient had a PCN reaction causing immediate rash, facial/tongue/throat swelling, SOB or lightheadedness with hypotension: No Has patient had a PCN reaction causing severe rash involving mucus membranes or skin necrosis: No Has patient had a PCN reaction that required hospitalization: No Has patient had a PCN reaction occurring within the last 10 years: No If all of the above answers are "NO", then may proceed with Cephalosporin use.   . Oxytetracycline Other (See Comments)    Fever blisters  . Tamoxifen Other (See Comments)    "GENERAL ILLNESS WITH LIGHTHEADNESS"   . Aromasin [Exemestane] Other (See  Comments)    UNSPECIFIED REACTION  Pt is unsure of what reaction was...  . Codeine Other (See Comments)    LIGHTHEADED    Current Outpatient Medications  Medication Sig Dispense Refill  . aspirin EC 325 MG tablet Take 1 tablet (325 mg total) by mouth 2 (two) times daily. 30 tablet 0  . betamethasone dipropionate (DIPROLENE) 0.05 % cream Apply 1 application 2 (two) times daily as needed topically (skin irritation).     . Calcium-Vitamin D (CALTRATE 600 PLUS-VIT D PO) Take 1 tablet by mouth daily with supper.     . cholecalciferol (VITAMIN D) 1000 UNITS tablet Take 1,000 Units by mouth daily with supper.     . Garlic (GARLIQUE) A999333 MG TBEC Take 1 tablet by mouth daily with supper.     . hydrochlorothiazide (HYDRODIURIL) 12.5 MG tablet Take 12.5 mg by mouth daily.    Marland Kitchen losartan (COZAAR) 100 MG tablet Take 100 mg by mouth daily.    . naproxen sodium (ALEVE) 220 MG tablet Take 220 mg by mouth daily as needed (pain).    Marland Kitchen oxyCODONE-acetaminophen (ROXICET) 5-325 MG tablet Take 1 tablet by mouth every 4 (four) hours as needed. 30 tablet 0  . tiZANidine (ZANAFLEX) 2 MG tablet Take 1 tablet (2 mg total) by mouth every 6 (six) hours as needed for muscle spasms. 60 tablet 0  . TURMERIC CURCUMIN PO Take 3 capsules daily with supper by mouth.     . vitamin C (ASCORBIC ACID) 500 MG tablet Take 500 mg by mouth daily.     No current facility-administered medications  for this visit.     Past Medical History:  Diagnosis Date  . Arthritis   . Bone spur    left heel  . Cancer ([place])   . Dyspnea    recent last 2 months when walk to mail box- "winded" less active -no exercise 2 months   . History of breast cancer    left, right  . Hypertension   . Pneumonia 10/2012    Past Surgical History:  Procedure Laterality Date  . ABDOMINAL HYSTERECTOMY    . BIOPSY  04/12/2017   Procedure: BIOPSY;  Surgeon: Rogene Houston, MD;  [facility];  Service: Endoscopy;;  ascending colon ulcer biopsies   . BREAST LUMPECTOMY  08/28/2001   left  . BREAST RECONSTRUCTION Right    implant  . CHOLECYSTECTOMY  2002  . COLONOSCOPY    . COLONOSCOPY N/A 04/12/2017   Procedure: COLONOSCOPY;  Surgeon: Rogene Houston, MD;  Location: [facility];  Service: Endoscopy;  Laterality: N/A;  830  . MASTECTOMY  08/15/2006   right  . PARTIAL HYSTERECTOMY  1985  . REPLACEMENT TOTAL KNEE  2009, 2010   right-2010, left 2009  . TISSUE EXPANDER PLACEMENT    . TOTAL HIP ARTHROPLASTY Right 10/09/2017   Procedure: RIGHT TOTAL HIP ARTHROPLASTY ANTERIOR APPROACH;  Surgeon: Frederik Pear, MD;  Location: [facility];  Service: Orthopedics;  Laterality: Right;    Social History   Socioeconomic History  . Marital status: Divorced    Spouse name: Not on file  . Number of children: Not on file  . Years of education: Not on file  . Highest education level: Not on file  Occupational History  . Not on file  Social Needs  . Financial resource strain: Not on file  . Food insecurity    Worry: Not on file    Inability: Not on file  . Transportation needs    Medical: Not on file    Non-medical: Not on file  Tobacco Use  . Smoking status: Former Smoker    Packs/day: 0.50    Years: 5.00    Pack years: 2.50    Types: Cigarettes    Quit date: 09/28/1983    Years since quitting: 35.8  . Smokeless tobacco: Never Used  Substance and Sexual Activity  . Alcohol use: No  . Drug use: No  . Sexual activity: Not on file  Lifestyle  . Physical activity    Days per week: Not on file    Minutes per session: Not on file  . Stress: Not on file  Relationships  . Social Herbalist on phone: Not on file    Gets together: Not on file    Attends religious service: Not on file    Active member of club or organization: Not on file    Attends meetings of clubs or organizations: Not on file    Relationship status: Not on file  . Intimate partner violence    Fear of current or ex partner: Not on file    Emotionally  abused: Not on file    Physically abused: Not on file    Forced sexual activity: Not on file  Other Topics Concern  . Not on file  Social History Narrative  . Not on file     No family history of premature CAD in 1st degree relatives.  Current Meds  Medication Sig  . aspirin EC 325 MG tablet Take 1 tablet (325 mg total) by  mouth 2 (two) times daily.  . betamethasone dipropionate (DIPROLENE) 0.05 % cream Apply 1 application 2 (two) times daily as needed topically (skin irritation).   . Calcium-Vitamin D (CALTRATE 600 PLUS-VIT D PO) Take 1 tablet by mouth daily with supper.   . cholecalciferol (VITAMIN D) 1000 UNITS tablet Take 1,000 Units by mouth daily with supper.   . Garlic (GARLIQUE) A999333 MG TBEC Take 1 tablet by mouth daily with supper.   . hydrochlorothiazide (HYDRODIURIL) 12.5 MG tablet Take 12.5 mg by mouth daily.  Marland Kitchen losartan (COZAAR) 100 MG tablet Take 100 mg by mouth daily.  . naproxen sodium (ALEVE) 220 MG tablet Take 220 mg by mouth daily as needed (pain).  Marland Kitchen oxyCODONE-acetaminophen (ROXICET) 5-325 MG tablet Take 1 tablet by mouth every 4 (four) hours as needed.  Marland Kitchen tiZANidine (ZANAFLEX) 2 MG tablet Take 1 tablet (2 mg total) by mouth every 6 (six) hours as needed for muscle spasms.  . TURMERIC CURCUMIN PO Take 3 capsules daily with supper by mouth.   . vitamin C (ASCORBIC ACID) 500 MG tablet Take 500 mg by mouth daily.      Review of systems complete and found to be negative unless listed above in HPI    Physical exam Blood pressure 132/72, pulse (!) 101, temperature 97.8 F (36.6 C), height 5\' 4"  (1.626 m), weight 203 lb (92.1 kg), SpO2 98 %. General: NAD Neck: No JVD, no thyromegaly or thyroid nodule.  Lungs: Clear to auscultation bilaterally with normal respiratory effort. CV: Nondisplaced PMI.  Regular rate and rhythm, normal S1/S2, no S3/S4, no murmur.  No peripheral edema.  No carotid bruit.    Abdomen: Soft, nontender, no distention.  Skin: Intact without  lesions or rashes.  Neurologic: Alert and oriented x 3.  Psych: Normal affect. Extremities: No clubbing or cyanosis.  HEENT: Normal.   ECG: Most recent ECG reviewed.   Labs: Lab Results  Component Value Date/Time   K 4.3 05/23/2019 03:00 PM   BUN 31 (H) 05/23/2019 03:00 PM   CREATININE 1.48 (H) 05/23/2019 03:00 PM   ALT 15 06/27/2011 11:27 AM   HGB 12.9 05/23/2019 03:00 PM     Lipids: Lab Results  Component Value Date/Time   LDLCALC 149 (H) 06/27/2011 11:27 AM   CHOL 230 (H) 06/27/2011 11:27 AM   TRIG 93 06/27/2011 11:27 AM   HDL 62 06/27/2011 11:27 AM        ASSESSMENT AND PLAN:  1.  Abnormal EKG: EKG reviewed above with sinus tachycardia and mild nonspecific ST segment deviation.  She has some shortness of breath which may be due to deconditioning.  There is no family history of premature coronary disease.  In order to rule out any occult ischemic heart disease, I will obtain a [place] for further clarification.  2.  Preoperative risk stratification: Please see discussion #1.  I suspect low risk for major adverse cardiac events but a nuclear stress test will help further clarify.   Disposition: Follow up to be determined  Signed: Kate Sable, M.D., F.A.C.C.  07/18/2019, 10:09 AM

## 2019-07-18 NOTE — Patient Instructions (Addendum)
Medication Instructions:Your physician recommends that you continue on your current medications as directed. Please refer to the Current Medication list given to you today.    Labwork: NONE   Procedures/Testing:  Your physician has requested that you have a lexiscan myoview. For further information please visit [email or web address]. Please follow instruction sheet, as given.   Follow-Up:To be determined after test   Any Additional Special Instructions Will Be Listed Below (If Applicable).     If you need a refill on your cardiac medications before your next appointment, please call your pharmacy.     Thank you for choosing [facility] !

## 2019-07-24 ENCOUNTER — Encounter (HOSPITAL_COMMUNITY): Payer: Self-pay

## 2019-07-24 ENCOUNTER — Encounter (HOSPITAL_COMMUNITY)
Admission: RE | Admit: 2019-07-24 | Discharge: 2019-07-24 | Disposition: A | Discharge status: Home / Self Care | Payer: PPO | Source: Ambulatory Visit | Attending: Cardiovascular Disease | Admitting: Cardiovascular Disease

## 2019-07-24 ENCOUNTER — Encounter (HOSPITAL_BASED_OUTPATIENT_CLINIC_OR_DEPARTMENT_OTHER)
Admission: RE | Admit: 2019-07-24 | Discharge: 2019-07-24 | Disposition: A | Discharge status: Home / Self Care | Payer: PPO | Source: Ambulatory Visit | Attending: Cardiovascular Disease | Admitting: Cardiovascular Disease

## 2019-07-24 ENCOUNTER — Other Ambulatory Visit: Payer: Self-pay

## 2019-07-24 DIAGNOSIS — R0602 Shortness of breath: Secondary | ICD-10-CM | POA: Diagnosis not present

## 2019-07-24 LAB — NM MYOCAR MULTI W/SPECT W/WALL MOTION / EF
LV dias vol: 56 mL (ref 46–106)
LV sys vol: 7 mL
Peak HR: 108 {beats}/min
RATE: 0.49
Rest HR: 77 {beats}/min
SDS: 3
SRS: 3
SSS: 6
TID: 0.94

## 2019-07-24 MED ORDER — TECHNETIUM TC 99M TETROFOSMIN IV KIT
30.0000 | PACK | Freq: Once | INTRAVENOUS | Status: AC | PRN
  Administered 2019-07-24: 32.3 via INTRAVENOUS

## 2019-07-24 MED ORDER — SODIUM CHLORIDE 0.9% FLUSH
INTRAVENOUS | Status: AC
  Administered 2019-07-24: 10 mL via INTRAVENOUS
  Filled 2019-07-24: qty 10

## 2019-07-24 MED ORDER — REGADENOSON 0.4 MG/5ML IV SOLN
INTRAVENOUS | Status: AC
  Administered 2019-07-24: 0.4 mg via INTRAVENOUS
  Filled 2019-07-24: qty 5

## 2019-07-24 MED ORDER — TECHNETIUM TC 99M TETROFOSMIN IV KIT
10.0000 | PACK | Freq: Once | INTRAVENOUS | Status: AC | PRN
  Administered 2019-07-24: 08:00:00 11 via INTRAVENOUS

## 2019-08-07 DIAGNOSIS — E669 Obesity, unspecified: Secondary | ICD-10-CM | POA: Diagnosis not present

## 2019-08-07 DIAGNOSIS — Z96641 Presence of right artificial hip joint: Secondary | ICD-10-CM | POA: Diagnosis not present

## 2019-08-07 DIAGNOSIS — I10 Essential (primary) hypertension: Secondary | ICD-10-CM | POA: Diagnosis not present

## 2019-08-07 DIAGNOSIS — Z96659 Presence of unspecified artificial knee joint: Secondary | ICD-10-CM | POA: Diagnosis not present

## 2019-08-07 DIAGNOSIS — M1710 Unilateral primary osteoarthritis, unspecified knee: Secondary | ICD-10-CM | POA: Diagnosis not present

## 2019-08-07 DIAGNOSIS — N183 Chronic kidney disease, stage 3 (moderate): Secondary | ICD-10-CM | POA: Diagnosis not present

## 2019-08-07 DIAGNOSIS — R0600 Dyspnea, unspecified: Secondary | ICD-10-CM | POA: Diagnosis not present

## 2019-08-07 DIAGNOSIS — R9431 Abnormal electrocardiogram [ECG] [EKG]: Secondary | ICD-10-CM | POA: Diagnosis not present

## 2019-08-27 ENCOUNTER — Telehealth: Payer: Self-pay | Admitting: Cardiovascular Disease

## 2019-08-27 ENCOUNTER — Other Ambulatory Visit: Payer: Self-pay | Admitting: Orthopedic Surgery

## 2019-08-27 NOTE — Telephone Encounter (Signed)
Wells Guiles @ [facility] is calling for pt's surgical clearance   Please call [phone]

## 2019-08-27 NOTE — Telephone Encounter (Signed)
Returned call to [place] at [facility]. Routed to VM. Left msg stating that surgical clearance was placed on providers desk for review.

## 2019-08-28 ENCOUNTER — Other Ambulatory Visit: Payer: Self-pay

## 2019-08-28 ENCOUNTER — Ambulatory Visit
Admission: RE | Admit: 2019-08-28 | Discharge: 2019-08-28 | Disposition: A | Discharge status: Home / Self Care | Payer: PPO | Source: Ambulatory Visit | Attending: Internal Medicine | Admitting: Internal Medicine

## 2019-08-28 DIAGNOSIS — Z1231 Encounter for screening mammogram for malignant neoplasm of breast: Secondary | ICD-10-CM | POA: Diagnosis not present

## 2019-08-28 HISTORY — DX: Personal history of irradiation: Z92.3

## 2019-09-17 NOTE — Patient Instructions (Addendum)
DUE TO COVID-19 ONLY ONE VISITOR IS ALLOWED TO COME WITH YOU AND STAY IN THE WAITING ROOM ONLY DURING PRE OP AND PROCEDURE DAY OF SURGERY. THE 1 VISITOR MAY VISIT WITH YOU AFTER SURGERY IN YOUR PRIVATE ROOM DURING VISITING HOURS ONLY!  YOU NEED TO HAVE A COVID 19 TEST ON____11-5___ @___2 :00PM____, THIS TEST MUST BE DONE BEFORE SURGERY, COME  [address], [place] , [address].  ([place]) ONCE YOUR COVID TEST IS COMPLETED, PLEASE BEGIN THE QUARANTINE INSTRUCTIONS AS OUTLINED IN YOUR HANDOUT.                 Haley Peterson     Your procedure is scheduled on: 09-23-2019   Report to [facility] Main  Entrance   Report to [place] at 5:30AM     Call this number if you have problems the morning of surgery [phone]    Do not eat food After Midnight. YOU MAY HAVE CLEAR LIQUIDS FROM MIDNIGHT UNTIL 4:30AM. At 4:30AM Please finish the prescribed Pre-Surgery ENSURE drink. Nothing by mouth after you finish the ENSURE drink !   CLEAR LIQUID DIET   Foods Allowed                                                                     Foods Excluded  Coffee and tea, regular and decaf                             liquids that you cannot  Plain Jell-O any favor except red or purple                                           see through such as: Fruit ices (not with fruit pulp)                                     milk, soups, orange juice  Iced Popsicles                                    All solid food Carbonated beverages, regular and diet                                    Cranberry, grape and apple juices Sports drinks like Gatorade Lightly seasoned clear broth or consume(fat free) Sugar, honey syrup  Sample Menu Breakfast                                Lunch                                     Supper Cranberry juice  Beef broth                            Chicken broth Jell-O                                     Grape juice                            Apple juice Coffee or tea                        Jell-O                                      Popsicle                                                Coffee or tea                        Coffee or tea  _____________________________________________________________________   BRUSH YOUR TEETH MORNING OF SURGERY AND RINSE YOUR MOUTH OUT, NO CHEWING GUM CANDY OR MINTS.     Take these medicines the morning of surgery with A SIP OF WATER: NONE                                 You may not have any metal on your body including hair pins and              piercings  Do not wear jewelry, make-up, lotions, powders or perfumes, deodorant             Do not wear nail polish on your fingernails.  Do not shave  48 hours prior to surgery.                 Do not bring valuables to the hospital. [place].  Contacts, dentures or bridgework may not be worn into surgery.  YOU MAY BRING A SMALL OVERNIGHT BAG                   Please read over the following fact sheets you were given: _____________________________________________________________________             [facility] - Preparing for Surgery Before surgery, you can play an important role.  Because skin is not sterile, your skin needs to be as free of germs as possible.  You can reduce the number of germs on your skin by washing with CHG (chlorahexidine gluconate) soap before surgery.  CHG is an antiseptic cleaner which kills germs and bonds with the skin to continue killing germs even after washing. Please DO NOT use if you have an allergy to CHG or antibacterial soaps.  If your skin becomes reddened/irritated stop using the CHG and inform your nurse when you arrive at Short Stay. Do not shave (including legs and underarms) for at least 48 hours  prior to the first CHG shower.  You may shave your face/neck. Please follow these instructions carefully:  1.  Shower with CHG Soap the night before  surgery and the  morning of Surgery.  2.  If you choose to wash your hair, wash your hair first as usual with your  normal  shampoo.  3.  After you shampoo, rinse your hair and body thoroughly to remove the  shampoo.                           4.  Use CHG as you would any other liquid soap.  You can apply chg directly  to the skin and wash                       Gently with a scrungie or clean washcloth.  5.  Apply the CHG Soap to your body ONLY FROM THE NECK DOWN.   Do not use on face/ open                           Wound or open sores. Avoid contact with eyes, ears mouth and genitals (private parts).                       Wash face,  Genitals (private parts) with your normal soap.             6.  Wash thoroughly, paying special attention to the area where your surgery  will be performed.  7.  Thoroughly rinse your body with warm water from the neck down.  8.  DO NOT shower/wash with your normal soap after using and rinsing off  the CHG Soap.                9.  Pat yourself dry with a clean towel.            10.  Wear clean pajamas.            11.  Place clean sheets on your bed the night of your first shower and do not  sleep with pets. Day of Surgery : Do not apply any lotions/deodorants the morning of surgery.  Please wear clean clothes to the hospital/surgery center.  FAILURE TO FOLLOW THESE INSTRUCTIONS MAY RESULT IN THE CANCELLATION OF YOUR SURGERY PATIENT SIGNATURE_________________________________  NURSE SIGNATURE__________________________________  ________________________________________________________________________   Haley Peterson  An incentive spirometer is a tool that can help keep your lungs clear and active. This tool measures how well you are filling your lungs with each breath. Taking long deep breaths may help reverse or decrease the chance of developing breathing (pulmonary) problems (especially infection) following:  A long period of time when you are unable to  move or be active. BEFORE THE PROCEDURE   If the spirometer includes an indicator to show your best effort, your nurse or respiratory therapist will set it to a desired goal.  If possible, sit up straight or lean slightly forward. Try not to slouch.  Hold the incentive spirometer in an upright position. INSTRUCTIONS FOR USE  1. Sit on the edge of your bed if possible, or sit up as far as you can in bed or on a chair. 2. Hold the incentive spirometer in an upright position. 3. Breathe out normally. 4. Place the mouthpiece in your mouth and seal your lips tightly around it. 5. Breathe  in slowly and as deeply as possible, raising the piston or the ball toward the top of the column. 6. Hold your breath for 3-5 seconds or for as long as possible. Allow the piston or ball to fall to the bottom of the column. 7. Remove the mouthpiece from your mouth and breathe out normally. 8. Rest for a few seconds and repeat Steps 1 through 7 at least 10 times every 1-2 hours when you are awake. Take your time and take a few normal breaths between deep breaths. 9. The spirometer may include an indicator to show your best effort. Use the indicator as a goal to work toward during each repetition. 10. After each set of 10 deep breaths, practice coughing to be sure your lungs are clear. If you have an incision (the cut made at the time of surgery), support your incision when coughing by placing a pillow or rolled up towels firmly against it. Once you are able to get out of bed, walk around indoors and cough well. You may stop using the incentive spirometer when instructed by your caregiver.  RISKS AND COMPLICATIONS  Take your time so you do not get dizzy or light-headed.  If you are in pain, you may need to take or ask for pain medication before doing incentive spirometry. It is harder to take a deep breath if you are having pain. AFTER USE  Rest and breathe slowly and easily.  It can be helpful to keep track of  a log of your progress. Your caregiver can provide you with a simple table to help with this. If you are using the spirometer at home, follow these instructions: [place] IF:   You are having difficultly using the spirometer.  You have trouble using the spirometer as often as instructed.  Your pain medication is not giving enough relief while using the spirometer.  You develop fever of 100.5 F (38.1 C) or higher. SEEK IMMEDIATE MEDICAL CARE IF:   You cough up bloody sputum that had not been present before.  You develop fever of 102 F (38.9 C) or greater.  You develop worsening pain at or near the incision site. MAKE SURE YOU:   Understand these instructions.  Will watch your condition.  Will get help right away if you are not doing well or get worse. Document Released: 03/13/2007 Document Revised: 01/23/2012 Document Reviewed: 05/14/2007 [facility] Patient Information 2014 [place], [place].   ________________________________________________________________________

## 2019-09-18 ENCOUNTER — Other Ambulatory Visit: Payer: Self-pay

## 2019-09-18 ENCOUNTER — Encounter (HOSPITAL_COMMUNITY): Payer: Self-pay

## 2019-09-18 ENCOUNTER — Encounter (HOSPITAL_COMMUNITY)
Admission: RE | Admit: 2019-09-18 | Discharge: 2019-09-18 | Disposition: A | Discharge status: Home / Self Care | Payer: PPO | Source: Ambulatory Visit | Attending: Orthopedic Surgery | Admitting: Orthopedic Surgery

## 2019-09-18 DIAGNOSIS — M1612 Unilateral primary osteoarthritis, left hip: Secondary | ICD-10-CM | POA: Insufficient documentation

## 2019-09-18 DIAGNOSIS — Z01812 Encounter for preprocedural laboratory examination: Secondary | ICD-10-CM | POA: Insufficient documentation

## 2019-09-18 HISTORY — DX: Unspecified cataract: H26.9

## 2019-09-18 HISTORY — DX: Chronic kidney disease, stage 3 unspecified: N18.30

## 2019-09-18 LAB — BASIC METABOLIC PANEL
Anion gap: 6 (ref 5–15)
BUN: 18 mg/dL (ref 8–23)
CO2: 27 mmol/L (ref 22–32)
Calcium: 8.9 mg/dL (ref 8.9–10.3)
Chloride: 99 mmol/L (ref 98–111)
Creatinine, Ser: 1.35 mg/dL — ABNORMAL HIGH (ref 0.44–1.00)
GFR calc Af Amer: 45 mL/min — ABNORMAL LOW (ref >60)
GFR calc non Af Amer: 39 mL/min — ABNORMAL LOW (ref >60)
Glucose, Bld: 92 mg/dL (ref 70–99)
Potassium: 3.9 mmol/L (ref 3.5–5.1)
Sodium: 132 mmol/L — ABNORMAL LOW (ref 135–145)

## 2019-09-18 LAB — CBC WITH DIFFERENTIAL/PLATELET
Abs Immature Granulocytes: 0.02 10*3/uL (ref 0.00–0.07)
Basophils Absolute: 0.1 10*3/uL (ref 0.0–0.1)
Basophils Relative: 1 %
Eosinophils Absolute: 0.3 10*3/uL (ref 0.0–0.5)
Eosinophils Relative: 3 %
HCT: 43.5 % (ref 36.0–46.0)
Hemoglobin: 13.6 g/dL (ref 12.0–15.0)
Immature Granulocytes: 0 %
Lymphocytes Relative: 16 %
Lymphs Abs: 1.6 10*3/uL (ref 0.7–4.0)
MCH: 28.9 pg (ref 26.0–34.0)
MCHC: 31.3 g/dL (ref 30.0–36.0)
MCV: 92.4 fL (ref 80.0–100.0)
Monocytes Absolute: 1 10*3/uL (ref 0.1–1.0)
Monocytes Relative: 10 %
Neutro Abs: 7.1 10*3/uL (ref 1.7–7.7)
Neutrophils Relative %: 70 %
Platelets: 348 10*3/uL (ref 150–400)
RBC: 4.71 MIL/uL (ref 3.87–5.11)
RDW: 12.9 % (ref 11.5–15.5)
WBC: 10 10*3/uL (ref 4.0–10.5)
nRBC: 0 % (ref 0.0–0.2)

## 2019-09-18 LAB — PROTIME-INR
INR: 1.1 (ref 0.8–1.2)
Prothrombin Time: 13.7 seconds (ref 11.4–15.2)

## 2019-09-18 LAB — URINALYSIS, ROUTINE W REFLEX MICROSCOPIC
Bilirubin Urine: NEGATIVE
Glucose, UA: NEGATIVE mg/dL
Hgb urine dipstick: NEGATIVE
Ketones, ur: NEGATIVE mg/dL
Leukocytes,Ua: NEGATIVE
Nitrite: NEGATIVE
Protein, ur: NEGATIVE mg/dL
Specific Gravity, Urine: 1.01 (ref 1.005–1.030)
pH: 7 (ref 5.0–8.0)

## 2019-09-18 LAB — APTT: aPTT: 26 seconds (ref 24–36)

## 2019-09-18 LAB — SURGICAL PCR SCREEN
MRSA, PCR: NEGATIVE
Staphylococcus aureus: NEGATIVE

## 2019-09-18 NOTE — Progress Notes (Addendum)
PCP - Celene Squibb, MD Cardiologist - Natividad Brood 07-18-2019  Chest x-ray - 05-23-2019 epic  EKG - 05-23-2019 epic  Stress Test - 07-24-2019 ECHO -  Cardiac Cath -   Sleep Study -  CPAP -   Fasting Blood Sugar -  Checks Blood Sugar _____ times a day  Blood Thinner Instructions: Aspirin Instructions: Last Dose:  Anesthesia review:  Patient was cancelled in July  2020 for abnormal EKG and reports SOB with exertion. She was seen by cardio Dr Bronson Ing for eval and underwent stress test which resulted as "low risk" . Today patient denies any acute cardiac symptoms and attributes her sob to her being out of shape. RN called and requested cardiac clearance from [facility] , surgical coordinator for Dr Mayer Camel. Wells Guiles to send clearance .   09-19-2019 update; Cardiac clearance Dr Bronson Ing on chart   Patient denies shortness of breath, fever, cough and chest pain at PAT appointment   Patient verbalized understanding of instructions that were given to them at the PAT appointment. Patient was also instructed that they will need to review over the PAT instructions again at home before surgery.

## 2019-09-19 ENCOUNTER — Other Ambulatory Visit (HOSPITAL_COMMUNITY)
Admission: RE | Admit: 2019-09-19 | Discharge: 2019-09-19 | Disposition: A | Discharge status: Home / Self Care | Payer: PPO | Source: Ambulatory Visit | Attending: Orthopedic Surgery | Admitting: Orthopedic Surgery

## 2019-09-19 DIAGNOSIS — Z01812 Encounter for preprocedural laboratory examination: Secondary | ICD-10-CM | POA: Insufficient documentation

## 2019-09-19 DIAGNOSIS — Z20828 Contact with and (suspected) exposure to other viral communicable diseases: Secondary | ICD-10-CM | POA: Diagnosis not present

## 2019-09-20 DIAGNOSIS — M1612 Unilateral primary osteoarthritis, left hip: Secondary | ICD-10-CM | POA: Diagnosis present

## 2019-09-20 LAB — NOVEL CORONAVIRUS, NAA (HOSP ORDER, SEND-OUT TO REF LAB; TAT 18-24 HRS): SARS-CoV-2, NAA: NOT DETECTED

## 2019-09-20 NOTE — H&P (Signed)
TOTAL HIP ADMISSION H&P  Patient is admitted for left total hip arthroplasty.  Subjective:  Chief Complaint: left hip pain  HPI: Haley Peterson, 73 y.o. female, has a history of pain and functional disability in the left hip(s) due to arthritis and patient has failed non-surgical conservative treatments for greater than 12 weeks to include NSAID's and/or analgesics, use of assistive devices and activity modification.  Onset of symptoms was gradual starting couple years ago with gradually worsening course since that time.The patient noted no past surgery on the left hip(s).  Patient currently rates pain in the left hip at 10 out of 10 with activity. Patient has night pain, worsening of pain with activity and weight bearing, trendelenberg gait, pain that interfers with activities of daily living, pain with passive range of motion and crepitus. Patient has evidence of periarticular osteophytes and joint space narrowing by imaging studies. This condition presents safety issues increasing the risk of falls.    There is no current active infection.  Patient Active Problem List   Diagnosis Date Noted  . Primary osteoarthritis of right hip 10/09/2017  . Osteoarthritis of right hip 10/04/2017  . Special screening for malignant neoplasms, colon 12/30/2016  . Breast cancer ([place]) 06/27/2011  . Hx Breast cancer, Right, multifocal IDC, receptor +, Her2 - 06/07/2006    Class: Stage 1  . Hx Breast cancer (DCIS), Right, Stage 0,  08/08/2001   Past Medical History:  Diagnosis Date  . Arthritis   . Bone spur    left heel  . Breast cancer ([place])    bilateral  . Cancer ([place])    Bilateral Breast cancer  . Cataract    right eye   . CKD (chronic kidney disease), stage III    mgd by PCP   . Dyspnea    recent last 2 months when walk to mail box- "winded" less active -no exercise 2 months   . History of breast cancer    left, right  . Hypertension   . Personal history of radiation therapy   . Pneumonia  10/2012    Past Surgical History:  Procedure Laterality Date  . ABDOMINAL HYSTERECTOMY    . BIOPSY  04/12/2017   Procedure: BIOPSY;  Surgeon: Rogene Houston, MD;  [facility];  Service: Endoscopy;;  ascending colon ulcer biopsies  . BREAST LUMPECTOMY  08/28/2001   left  . BREAST RECONSTRUCTION Right    implant  . CHOLECYSTECTOMY  2002  . COLONOSCOPY    . COLONOSCOPY N/A 04/12/2017   Procedure: COLONOSCOPY;  Surgeon: Rogene Houston, MD;  Location: [facility];  Service: Endoscopy;  Laterality: N/A;  830  . MASTECTOMY  08/15/2006   right  . PARTIAL HYSTERECTOMY  1985  . REPLACEMENT TOTAL KNEE  2009, 2010   right-2010, left 2009  . TISSUE EXPANDER PLACEMENT    . TOTAL HIP ARTHROPLASTY Right 10/09/2017   Procedure: RIGHT TOTAL HIP ARTHROPLASTY ANTERIOR APPROACH;  Surgeon: Frederik Pear, MD;  Location: [facility];  Service: Orthopedics;  Laterality: Right;    No current facility-administered medications for this encounter.    Current Outpatient Medications  Medication Sig Dispense Refill Last Dose  . Ascorbic Acid (VITAMIN C) 1000 MG tablet Take 1,000 mg by mouth daily with lunch.     . Calcium-Vitamin D (CALTRATE 600 PLUS-VIT D PO) Take 1 tablet by mouth daily with lunch.      . cholecalciferol (VITAMIN D) 1000 UNITS tablet Take 1,000 Units by mouth daily  with lunch.      . Garlic (GARLIQUE PO) Take 1 tablet by mouth daily with lunch.     . hydrochlorothiazide (HYDRODIURIL) 12.5 MG tablet Take 12.5 mg by mouth daily.     Marland Kitchen ibuprofen (ADVIL) 200 MG tablet Take 400 mg by mouth every 8 (eight) hours as needed (for pain.).     Marland Kitchen losartan (COZAAR) 100 MG tablet Take 100 mg by mouth daily.     . naproxen sodium (ALEVE) 220 MG tablet Take 220-440 mg by mouth 2 (two) times daily as needed (pain).      . TURMERIC PO Take 3 capsules by mouth daily with lunch.      Allergies  Allergen Reactions  . Penicillins Other (See Comments)    "LOSS OF VISION"  Has patient had a PCN  reaction causing immediate rash, facial/tongue/throat swelling, SOB or lightheadedness with hypotension: No Has patient had a PCN reaction causing severe rash involving mucus membranes or skin necrosis: No Has patient had a PCN reaction that required hospitalization: No Has patient had a PCN reaction occurring within the last 10 years: No If all of the above answers are "NO", then may proceed with Cephalosporin use.   . Oxytetracycline Other (See Comments)    Fever blisters  . Tamoxifen Other (See Comments)    "GENERAL ILLNESS WITH LIGHTHEADNESS"   . Aromasin [Exemestane] Other (See Comments)    UNSPECIFIED REACTION  Pt is unsure of what reaction was...  . Codeine Other (See Comments)    LIGHTHEADED    Social History   Tobacco Use  . Smoking status: Former Smoker    Packs/day: 0.50    Years: 5.00    Pack years: 2.50    Types: Cigarettes    Quit date: 09/28/1983    Years since quitting: 36.0  . Smokeless tobacco: Never Used  Substance Use Topics  . Alcohol use: No    Family History  Problem Relation Age of Onset  . Arthritis Mother   . Thyroid cancer Brother        2010  . Colon cancer Neg Hx      Review of Systems  Constitutional: Negative.   HENT: Positive for tinnitus.   Eyes: Negative.   Respiratory: Positive for shortness of breath.   Cardiovascular: Negative.   Gastrointestinal: Negative.   Genitourinary: Negative.   Musculoskeletal: Positive for joint pain.  Skin: Negative.   Neurological: Negative.   Endo/Heme/Allergies: Negative.   Psychiatric/Behavioral: Negative.     Objective:  Physical Exam  Constitutional: She is oriented to person, place, and time. She appears well-developed and well-nourished.  HENT:  Head: Normocephalic and atraumatic.  Eyes: Pupils are equal, round, and reactive to light.  Neck: Normal range of motion. Neck supple.  Cardiovascular: Intact distal pulses.  Respiratory: Effort normal.  Musculoskeletal:        General:  Tenderness present.     Comments: Patient walks with a significant left-sided limp.  Any attempts at internal rotation of the left lower extremity causes significant pain.  Foot tap is negative.  Right hip surgical scar is well-healed internal next rotation 40 each.  Both total knees come to full extension, both flex 110.  No palpable effusions, collateral ligaments are stable.  Neurological: She is alert and oriented to person, place, and time.  Skin: Skin is warm and dry.  Psychiatric: She has a normal mood and affect. Her behavior is normal. Judgment and thought content normal.    Vital signs in last  24 hours:    Labs:   Estimated body mass index is 35.07 kg/m as calculated from the following:   Height as of 09/18/19: 5' 4" (1.626 m).   Weight as of 09/18/19: 92.7 kg.   Imaging Review Plain radiographs demonstrate arthritis left hip.  She only has a couple of millimeters of cartilage.  She does have a periarticular osteophyte formation.   Assessment/Plan:  End stage arthritis, left hip(s)  The patient history, physical examination, clinical judgement of the provider and imaging studies are consistent with end stage degenerative joint disease of the left hip(s) and total hip arthroplasty is deemed medically necessary. The treatment options including medical management, injection therapy, arthroscopy and arthroplasty were discussed at length. The risks and benefits of total hip arthroplasty were presented and reviewed. The risks due to aseptic loosening, infection, stiffness, dislocation/subluxation,  thromboembolic complications and other imponderables were discussed.  The patient acknowledged the explanation, agreed to proceed with the plan and consent was signed. Patient is being admitted for inpatient treatment for surgery, pain control, PT, OT, prophylactic antibiotics, VTE prophylaxis, progressive ambulation and ADL's and discharge planning.The patient is planning to be discharged  home with home health services    Patient's anticipated LOS is less than 2 midnights, meeting these requirements: - Younger than 54 - Lives within 1 hour of care - Has a competent adult at home to recover with post-op recover - NO history of  - Chronic pain requiring opiods  - Diabetes  - Coronary Artery Disease  - Heart failure  - Heart attack  - Stroke  - DVT/VTE  - Cardiac arrhythmia  - Respiratory Failure/COPD  - Renal failure  - Anemia  - Advanced Liver disease

## 2019-09-22 MED ORDER — EPHEDRINE 5 MG/ML INJ
INTRAVENOUS | Status: AC
  Filled 2019-09-22: qty 10

## 2019-09-22 MED ORDER — TRANEXAMIC ACID 1000 MG/10ML IV SOLN
2000.0000 mg | INTRAVENOUS | Status: DC
  Filled 2019-09-22: qty 20

## 2019-09-22 MED ORDER — BUPIVACAINE LIPOSOME 1.3 % IJ SUSP
10.0000 mL | Freq: Once | INTRAMUSCULAR | Status: DC
  Filled 2019-09-22: qty 10

## 2019-09-22 NOTE — Anesthesia Preprocedure Evaluation (Addendum)
Anesthesia Evaluation  Patient identified by MRN, date of birth, ID band Patient awake    Reviewed: Allergy & Precautions, H&P , NPO status , Patient's Chart, lab work & pertinent test results  Airway Mallampati: II  TM Distance: >3 FB Neck ROM: Full    Dental no notable dental hx. (+) Teeth Intact, Dental Advisory Given   Pulmonary neg pulmonary ROS, former smoker,    Pulmonary exam normal breath sounds clear to auscultation       Cardiovascular Exercise Tolerance: Good hypertension, Pt. on medications  Rhythm:Regular Rate:Normal     Neuro/Psych negative neurological ROS  negative psych ROS   GI/Hepatic negative GI ROS, Neg liver ROS,   Endo/Other  negative endocrine ROS  Renal/GU Renal InsufficiencyRenal disease  negative genitourinary   Musculoskeletal  (+) Arthritis , Osteoarthritis,    Abdominal   Peds  Hematology negative hematology ROS (+)   Anesthesia Other Findings   Reproductive/Obstetrics negative OB ROS                            Anesthesia Physical Anesthesia Plan  ASA: II  Anesthesia Plan: Spinal   Post-op Pain Management:    Induction: Intravenous  PONV Risk Score and Plan: 3 and Ondansetron, Dexamethasone, Propofol infusion and TIVA  Airway Management Planned: Simple Face Mask  Additional Equipment:   Intra-op Plan:   Post-operative Plan:   Informed Consent: I have reviewed the patients History and Physical, chart, labs and discussed the procedure including the risks, benefits and alternatives for the proposed anesthesia with the patient or authorized representative who has indicated his/her understanding and acceptance.     Dental advisory given  Plan Discussed with: CRNA  Anesthesia Plan Comments:         Anesthesia Quick Evaluation

## 2019-09-23 ENCOUNTER — Other Ambulatory Visit: Payer: Self-pay

## 2019-09-23 ENCOUNTER — Ambulatory Visit (HOSPITAL_COMMUNITY): Payer: PPO | Admitting: Anesthesiology

## 2019-09-23 ENCOUNTER — Observation Stay (HOSPITAL_COMMUNITY)
Admission: RE | Admit: 2019-09-23 | Discharge: 2019-09-24 | Disposition: A | Discharge status: Home / Self Care | Payer: PPO | Attending: Orthopedic Surgery | Admitting: Orthopedic Surgery

## 2019-09-23 ENCOUNTER — Encounter (HOSPITAL_COMMUNITY): Payer: Self-pay

## 2019-09-23 ENCOUNTER — Ambulatory Visit (HOSPITAL_COMMUNITY): Payer: PPO

## 2019-09-23 ENCOUNTER — Encounter (HOSPITAL_COMMUNITY)
Admission: RE | Disposition: A | Discharge status: Home / Self Care | Payer: Self-pay | Source: Home / Self Care | Attending: Orthopedic Surgery

## 2019-09-23 DIAGNOSIS — Z96641 Presence of right artificial hip joint: Secondary | ICD-10-CM | POA: Insufficient documentation

## 2019-09-23 DIAGNOSIS — I129 Hypertensive chronic kidney disease with stage 1 through stage 4 chronic kidney disease, or unspecified chronic kidney disease: Secondary | ICD-10-CM | POA: Insufficient documentation

## 2019-09-23 DIAGNOSIS — Z79899 Other long term (current) drug therapy: Secondary | ICD-10-CM | POA: Insufficient documentation

## 2019-09-23 DIAGNOSIS — Z87891 Personal history of nicotine dependence: Secondary | ICD-10-CM | POA: Insufficient documentation

## 2019-09-23 DIAGNOSIS — Z853 Personal history of malignant neoplasm of breast: Secondary | ICD-10-CM | POA: Diagnosis not present

## 2019-09-23 DIAGNOSIS — Z419 Encounter for procedure for purposes other than remedying health state, unspecified: Secondary | ICD-10-CM

## 2019-09-23 DIAGNOSIS — Z96651 Presence of right artificial knee joint: Secondary | ICD-10-CM | POA: Diagnosis not present

## 2019-09-23 DIAGNOSIS — Z96642 Presence of left artificial hip joint: Secondary | ICD-10-CM | POA: Diagnosis not present

## 2019-09-23 DIAGNOSIS — M1612 Unilateral primary osteoarthritis, left hip: Secondary | ICD-10-CM | POA: Diagnosis not present

## 2019-09-23 DIAGNOSIS — N183 Chronic kidney disease, stage 3 unspecified: Secondary | ICD-10-CM | POA: Diagnosis not present

## 2019-09-23 DIAGNOSIS — D62 Acute posthemorrhagic anemia: Secondary | ICD-10-CM | POA: Diagnosis not present

## 2019-09-23 DIAGNOSIS — Z923 Personal history of irradiation: Secondary | ICD-10-CM | POA: Insufficient documentation

## 2019-09-23 DIAGNOSIS — Z471 Aftercare following joint replacement surgery: Secondary | ICD-10-CM | POA: Diagnosis not present

## 2019-09-23 HISTORY — PX: TOTAL HIP ARTHROPLASTY: SHX124

## 2019-09-23 LAB — TYPE AND SCREEN
ABO/RH(D): A POS
Antibody Screen: NEGATIVE

## 2019-09-23 SURGERY — ARTHROPLASTY, HIP, TOTAL, ANTERIOR APPROACH
Anesthesia: Spinal | Laterality: Left

## 2019-09-23 MED ORDER — MENTHOL 3 MG MT LOZG: 1.0000 | LOZENGE | OROMUCOSAL | Status: DC | PRN

## 2019-09-23 MED ORDER — FLEET ENEMA 7-19 GM/118ML RE ENEM: 1.0000 | ENEMA | Freq: Once | RECTAL | Status: DC | PRN

## 2019-09-23 MED ORDER — POVIDONE-IODINE 10 % EX SWAB
2.0000 "application " | Freq: Once | CUTANEOUS | Status: AC
  Administered 2019-09-23: 2 via TOPICAL

## 2019-09-23 MED ORDER — LOSARTAN POTASSIUM 50 MG PO TABS: 100.0000 mg | ORAL_TABLET | Freq: Every day | ORAL | Status: DC

## 2019-09-23 MED ORDER — FENTANYL CITRATE (PF) 100 MCG/2ML IJ SOLN
INTRAMUSCULAR | Status: DC | PRN
  Administered 2019-09-23: 100 ug via INTRAVENOUS

## 2019-09-23 MED ORDER — KCL IN DEXTROSE-NACL 20-5-0.45 MEQ/L-%-% IV SOLN
INTRAVENOUS | Status: DC
  Administered 2019-09-23 – 2019-09-24 (×3): via INTRAVENOUS
  Filled 2019-09-23 (×4): qty 1000

## 2019-09-23 MED ORDER — PANTOPRAZOLE SODIUM 40 MG PO TBEC
40.0000 mg | DELAYED_RELEASE_TABLET | Freq: Every day | ORAL | Status: DC
  Administered 2019-09-24: 09:00:00 40 mg via ORAL
  Filled 2019-09-23: qty 1

## 2019-09-23 MED ORDER — BUPIVACAINE HCL 0.25 % IJ SOLN
INTRAMUSCULAR | Status: DC | PRN
  Administered 2019-09-23: 30 mL

## 2019-09-23 MED ORDER — ACETAMINOPHEN 325 MG PO TABS: 325.0000 mg | ORAL_TABLET | Freq: Four times a day (QID) | ORAL | Status: DC | PRN

## 2019-09-23 MED ORDER — PROPOFOL 500 MG/50ML IV EMUL
INTRAVENOUS | Status: DC | PRN
  Administered 2019-09-23: 30 mg via INTRAVENOUS

## 2019-09-23 MED ORDER — VITAMIN D 25 MCG (1000 UNIT) PO TABS
1000.0000 [IU] | ORAL_TABLET | Freq: Every day | ORAL | Status: DC
  Administered 2019-09-24: 1000 [IU] via ORAL
  Filled 2019-09-23: qty 1

## 2019-09-23 MED ORDER — METHOCARBAMOL 500 MG PO TABS
500.0000 mg | ORAL_TABLET | Freq: Four times a day (QID) | ORAL | Status: DC | PRN
  Administered 2019-09-23: 500 mg via ORAL
  Filled 2019-09-23 (×2): qty 1

## 2019-09-23 MED ORDER — BISACODYL 5 MG PO TBEC: 5.0000 mg | DELAYED_RELEASE_TABLET | Freq: Every day | ORAL | Status: DC | PRN

## 2019-09-23 MED ORDER — PROPOFOL 10 MG/ML IV BOLUS
INTRAVENOUS | Status: AC
  Filled 2019-09-23: qty 40

## 2019-09-23 MED ORDER — BUPIVACAINE IN DEXTROSE 0.75-8.25 % IT SOLN
INTRATHECAL | Status: DC | PRN
  Administered 2019-09-23: 15 mg via INTRATHECAL

## 2019-09-23 MED ORDER — VANCOMYCIN HCL IN DEXTROSE 1-5 GM/200ML-% IV SOLN
1000.0000 mg | INTRAVENOUS | Status: AC
  Administered 2019-09-23: 1000 mg via INTRAVENOUS
  Filled 2019-09-23: qty 200

## 2019-09-23 MED ORDER — HYDROMORPHONE HCL 1 MG/ML IJ SOLN: 0.5000 mg | INTRAMUSCULAR | Status: DC | PRN

## 2019-09-23 MED ORDER — DEXAMETHASONE SODIUM PHOSPHATE 10 MG/ML IJ SOLN
10.0000 mg | Freq: Once | INTRAMUSCULAR | Status: AC
  Administered 2019-09-24: 10 mg via INTRAVENOUS
  Filled 2019-09-23: qty 1

## 2019-09-23 MED ORDER — PROPOFOL 10 MG/ML IV BOLUS
INTRAVENOUS | Status: AC
  Filled 2019-09-23: qty 20

## 2019-09-23 MED ORDER — ALUMINUM HYDROXIDE GEL 320 MG/5ML PO SUSP: 15.0000 mL | ORAL | Status: DC | PRN

## 2019-09-23 MED ORDER — PROPOFOL 500 MG/50ML IV EMUL
INTRAVENOUS | Status: DC | PRN
  Administered 2019-09-23: 75 ug/kg/min via INTRAVENOUS

## 2019-09-23 MED ORDER — METOCLOPRAMIDE HCL 5 MG PO TABS: 5.0000 mg | ORAL_TABLET | Freq: Three times a day (TID) | ORAL | Status: DC | PRN

## 2019-09-23 MED ORDER — PHENYLEPHRINE HCL-NACL 10-0.9 MG/250ML-% IV SOLN
INTRAVENOUS | Status: DC | PRN
  Administered 2019-09-23: 30 ug/min via INTRAVENOUS

## 2019-09-23 MED ORDER — TRANEXAMIC ACID-NACL 1000-0.7 MG/100ML-% IV SOLN
1000.0000 mg | Freq: Once | INTRAVENOUS | Status: AC
  Administered 2019-09-23: 1000 mg via INTRAVENOUS
  Filled 2019-09-23: qty 100

## 2019-09-23 MED ORDER — PROPOFOL 10 MG/ML IV BOLUS
INTRAVENOUS | Status: AC
  Filled 2019-09-23: qty 60

## 2019-09-23 MED ORDER — METOCLOPRAMIDE HCL 5 MG/ML IJ SOLN: 5.0000 mg | Freq: Three times a day (TID) | INTRAMUSCULAR | Status: DC | PRN

## 2019-09-23 MED ORDER — ACETAMINOPHEN 500 MG PO TABS
1000.0000 mg | ORAL_TABLET | Freq: Once | ORAL | Status: AC
  Administered 2019-09-23: 06:00:00 1000 mg via ORAL
  Filled 2019-09-23: qty 2

## 2019-09-23 MED ORDER — TRANEXAMIC ACID-NACL 1000-0.7 MG/100ML-% IV SOLN
1000.0000 mg | INTRAVENOUS | Status: AC
  Administered 2019-09-23: 08:00:00 1000 mg via INTRAVENOUS
  Filled 2019-09-23: qty 100

## 2019-09-23 MED ORDER — SODIUM CHLORIDE (PF) 0.9 % IJ SOLN
INTRAMUSCULAR | Status: AC
  Filled 2019-09-23: qty 50

## 2019-09-23 MED ORDER — 0.9 % SODIUM CHLORIDE (POUR BTL) OPTIME
TOPICAL | Status: DC | PRN
  Administered 2019-09-23: 1000 mL

## 2019-09-23 MED ORDER — BUPIVACAINE LIPOSOME 1.3 % IJ SUSP
INTRAMUSCULAR | Status: DC | PRN
  Administered 2019-09-23: 10 mL

## 2019-09-23 MED ORDER — HYDROMORPHONE HCL 1 MG/ML IJ SOLN: 0.2500 mg | INTRAMUSCULAR | Status: DC | PRN

## 2019-09-23 MED ORDER — ONDANSETRON HCL 4 MG/2ML IJ SOLN
4.0000 mg | Freq: Four times a day (QID) | INTRAMUSCULAR | Status: DC | PRN
  Administered 2019-09-24: 4 mg via INTRAVENOUS
  Filled 2019-09-23: qty 2

## 2019-09-23 MED ORDER — DOCUSATE SODIUM 100 MG PO CAPS
100.0000 mg | ORAL_CAPSULE | Freq: Two times a day (BID) | ORAL | Status: DC
  Administered 2019-09-23 – 2019-09-24 (×2): 100 mg via ORAL
  Filled 2019-09-23 (×2): qty 1

## 2019-09-23 MED ORDER — ONDANSETRON HCL 4 MG PO TABS
4.0000 mg | ORAL_TABLET | Freq: Four times a day (QID) | ORAL | Status: DC | PRN
  Administered 2019-09-23: 4 mg via ORAL
  Filled 2019-09-23: qty 1

## 2019-09-23 MED ORDER — TRANEXAMIC ACID 1000 MG/10ML IV SOLN
INTRAVENOUS | Status: DC | PRN
  Administered 2019-09-23: 08:00:00 2000 mg via TOPICAL

## 2019-09-23 MED ORDER — ASPIRIN EC 81 MG PO TBEC: 81.0000 mg | DELAYED_RELEASE_TABLET | Freq: Two times a day (BID) | ORAL | 0 refills | Status: DC

## 2019-09-23 MED ORDER — OXYCODONE HCL 5 MG PO TABS
5.0000 mg | ORAL_TABLET | ORAL | Status: DC | PRN
  Administered 2019-09-23: 5 mg via ORAL
  Filled 2019-09-23: qty 1

## 2019-09-23 MED ORDER — CHLORHEXIDINE GLUCONATE 4 % EX LIQD: 60.0000 mL | Freq: Once | CUTANEOUS | Status: DC

## 2019-09-23 MED ORDER — TIZANIDINE HCL 2 MG PO TABS: 2.0000 mg | ORAL_TABLET | Freq: Four times a day (QID) | ORAL | 0 refills | Status: DC | PRN

## 2019-09-23 MED ORDER — SODIUM CHLORIDE 0.9% FLUSH
INTRAVENOUS | Status: DC | PRN
  Administered 2019-09-23: 50 mL

## 2019-09-23 MED ORDER — FENTANYL CITRATE (PF) 100 MCG/2ML IJ SOLN
INTRAMUSCULAR | Status: AC
  Filled 2019-09-23: qty 2

## 2019-09-23 MED ORDER — PHENOL 1.4 % MT LIQD: 1.0000 | OROMUCOSAL | Status: DC | PRN

## 2019-09-23 MED ORDER — METHOCARBAMOL 500 MG IVPB - SIMPLE MED
500.0000 mg | Freq: Four times a day (QID) | INTRAVENOUS | Status: DC | PRN
  Filled 2019-09-23: qty 50

## 2019-09-23 MED ORDER — POLYETHYLENE GLYCOL 3350 17 G PO PACK: 17.0000 g | PACK | Freq: Every day | ORAL | Status: DC | PRN

## 2019-09-23 MED ORDER — HYDROCHLOROTHIAZIDE 25 MG PO TABS: 12.5000 mg | ORAL_TABLET | Freq: Every day | ORAL | Status: DC

## 2019-09-23 MED ORDER — VITAMIN C 500 MG PO TABS
1000.0000 mg | ORAL_TABLET | Freq: Every day | ORAL | Status: DC
  Administered 2019-09-24: 1000 mg via ORAL
  Filled 2019-09-23: qty 2

## 2019-09-23 MED ORDER — GABAPENTIN 300 MG PO CAPS
300.0000 mg | ORAL_CAPSULE | Freq: Three times a day (TID) | ORAL | Status: DC
  Administered 2019-09-23 – 2019-09-24 (×4): 300 mg via ORAL
  Filled 2019-09-23 (×4): qty 1

## 2019-09-23 MED ORDER — ASPIRIN 81 MG PO CHEW
81.0000 mg | CHEWABLE_TABLET | Freq: Two times a day (BID) | ORAL | Status: DC
  Administered 2019-09-23 – 2019-09-24 (×2): 81 mg via ORAL
  Filled 2019-09-23 (×2): qty 1

## 2019-09-23 MED ORDER — LACTATED RINGERS IV SOLN
INTRAVENOUS | Status: DC
  Administered 2019-09-23 (×2): via INTRAVENOUS

## 2019-09-23 MED ORDER — PHENYLEPHRINE HCL (PRESSORS) 10 MG/ML IV SOLN
INTRAVENOUS | Status: DC | PRN
  Administered 2019-09-23 (×4): 80 ug via INTRAVENOUS

## 2019-09-23 MED ORDER — CELECOXIB 200 MG PO CAPS
200.0000 mg | ORAL_CAPSULE | Freq: Two times a day (BID) | ORAL | Status: DC
  Administered 2019-09-23 – 2019-09-24 (×2): 200 mg via ORAL
  Filled 2019-09-23 (×2): qty 1

## 2019-09-23 MED ORDER — OXYCODONE-ACETAMINOPHEN 5-325 MG PO TABS: 1.0000 | ORAL_TABLET | ORAL | 0 refills | Status: DC | PRN

## 2019-09-23 MED ORDER — ACETAMINOPHEN 500 MG PO TABS
1000.0000 mg | ORAL_TABLET | Freq: Four times a day (QID) | ORAL | Status: AC
  Administered 2019-09-23 (×2): 1000 mg via ORAL
  Filled 2019-09-23 (×2): qty 2

## 2019-09-23 MED ORDER — DIPHENHYDRAMINE HCL 12.5 MG/5ML PO ELIX: 12.5000 mg | ORAL_SOLUTION | ORAL | Status: DC | PRN

## 2019-09-23 MED ORDER — BUPIVACAINE HCL (PF) 0.25 % IJ SOLN
INTRAMUSCULAR | Status: AC
  Filled 2019-09-23: qty 30

## 2019-09-23 SURGICAL SUPPLY — 48 items
BAG DECANTER FOR FLEXI CONT (MISCELLANEOUS) ×3 IMPLANT
BLADE SAW SGTL 18X1.27X75 (BLADE) ×2 IMPLANT
BLADE SAW SGTL 18X1.27X75MM (BLADE) ×1
BLADE SURG SZ10 CARB STEEL (BLADE) ×6 IMPLANT
CONT SPEC 4OZ CLIKSEAL STRL BL (MISCELLANEOUS) ×3 IMPLANT
COVER PERINEAL POST (MISCELLANEOUS) ×3 IMPLANT
COVER SURGICAL LIGHT HANDLE (MISCELLANEOUS) ×3 IMPLANT
COVER WAND RF STERILE (DRAPES) ×3 IMPLANT
CUP ACETBLR 52 OD 100 SERIES (Hips) ×3 IMPLANT
DECANTER SPIKE VIAL GLASS SM (MISCELLANEOUS) ×6 IMPLANT
DRAPE STERI IOBAN 125X83 (DRAPES) ×3 IMPLANT
DRAPE U-SHAPE 47X51 STRL (DRAPES) ×6 IMPLANT
DRESSING AQUACEL AG SP 3.5X10 (GAUZE/BANDAGES/DRESSINGS) ×1 IMPLANT
DRSG AQUACEL AG ADV 3.5X10 (GAUZE/BANDAGES/DRESSINGS) ×3 IMPLANT
DRSG AQUACEL AG SP 3.5X10 (GAUZE/BANDAGES/DRESSINGS) ×3
DURAPREP 26ML APPLICATOR (WOUND CARE) ×3 IMPLANT
ELECT BLADE TIP CTD 4 INCH (ELECTRODE) ×3 IMPLANT
ELECT REM PT RETURN 15FT ADLT (MISCELLANEOUS) ×3 IMPLANT
ELIMINATOR HOLE APEX DEPUY (Hips) ×3 IMPLANT
GLOVE BIO SURGEON STRL SZ7.5 (GLOVE) ×3 IMPLANT
GLOVE BIO SURGEON STRL SZ8.5 (GLOVE) ×3 IMPLANT
GLOVE BIOGEL PI IND STRL 8 (GLOVE) ×1 IMPLANT
GLOVE BIOGEL PI IND STRL 9 (GLOVE) ×1 IMPLANT
GLOVE BIOGEL PI INDICATOR 8 (GLOVE) ×2
GLOVE BIOGEL PI INDICATOR 9 (GLOVE) ×2
GOWN STRL REUS W/TWL XL LVL3 (GOWN DISPOSABLE) ×6 IMPLANT
HEAD CERAMIC DELTA 36 PLUS 1.5 (Hips) ×3 IMPLANT
HOLDER FOLEY CATH W/STRAP (MISCELLANEOUS) ×3 IMPLANT
KIT TURNOVER KIT A (KITS) IMPLANT
LINER NEUTRAL 52X36MM PLUS 4 (Liner) ×3 IMPLANT
MANIFOLD NEPTUNE II (INSTRUMENTS) ×3 IMPLANT
NEEDLE HYPO 21X1.5 SAFETY (NEEDLE) ×6 IMPLANT
NS IRRIG 1000ML POUR BTL (IV SOLUTION) ×3 IMPLANT
PACK ANTERIOR HIP CUSTOM (KITS) ×3 IMPLANT
STEM FEMORAL SZ6 HIGH ACTIS (Stem) ×3 IMPLANT
SUT ETHIBOND NAB CT1 #1 30IN (SUTURE) ×3 IMPLANT
SUT VIC AB 0 CT1 27 (SUTURE) ×2
SUT VIC AB 0 CT1 27XBRD ANBCTR (SUTURE) ×1 IMPLANT
SUT VIC AB 1 CTX 36 (SUTURE) ×2
SUT VIC AB 1 CTX36XBRD ANBCTR (SUTURE) ×1 IMPLANT
SUT VIC AB 2-0 CT1 27 (SUTURE) ×2
SUT VIC AB 2-0 CT1 TAPERPNT 27 (SUTURE) ×1 IMPLANT
SUT VIC AB 3-0 CT1 27 (SUTURE) ×2
SUT VIC AB 3-0 CT1 TAPERPNT 27 (SUTURE) ×1 IMPLANT
SYR CONTROL 10ML LL (SYRINGE) ×9 IMPLANT
TRAY FOLEY MTR SLVR 14FR STAT (SET/KITS/TRAYS/PACK) ×3 IMPLANT
TRAY FOLEY MTR SLVR 16FR STAT (SET/KITS/TRAYS/PACK) IMPLANT
YANKAUER SUCT BULB TIP 10FT TU (MISCELLANEOUS) ×3 IMPLANT

## 2019-09-23 NOTE — Discharge Instructions (Signed)

## 2019-09-23 NOTE — Plan of Care (Signed)

## 2019-09-23 NOTE — Evaluation (Signed)
Physical Therapy Evaluation Patient Details Name: Haley Peterson MRN: [id number] DOB: 07-10-1946 Today's Date: 09/23/2019   History of Present Illness  Patient is 73 y.o. female s/p Lt THA anterior approach with PMH significant for HTN, breast cancer, CKD's, OA, and bil TKA's, Rt THA.    Clinical Impression  Haley Peterson is a 73 y.o. female POD 0 s/p Lt THA anterior approach. Patient reports independence with mobility at baseline. Patient is now limited by functional impairments (see PT problem list below) and requires min assist for transfers and gait with RW. Patient was able to ambulate ~90 feet with RW and cues/assist for safe management of RW intermittently. Patient instructed in exercise to facilitate ROM and circulation. Patient will benefit from continued skilled PT interventions to address impairments and progress towards PLOF. Acute PT will follow to progress mobility and stair training in preparation for safe discharge home.    Follow Up Recommendations Follow surgeon's recommendation for DC plan and follow-up therapies    Equipment Recommendations  None recommended by PT    Recommendations for Other Services       Precautions / Restrictions Precautions Precautions: Fall Restrictions Weight Bearing Restrictions: No      Mobility  Bed Mobility Overal bed mobility: Needs Assistance Bed Mobility: Supine to Sit     Supine to sit: Min assist     General bed mobility comments: cues for use of bed rail, assist required for Lt LE mobility and to raise trunk completely  Transfers Overall transfer level: Needs assistance Equipment used: Rolling walker (2 wheeled) Transfers: Sit to/from Stand Sit to Stand: Min assist         General transfer comment: cues for safe hand placement and technique with RW, assist required to initiate power up and complete rise to stand  Ambulation/Gait Ambulation/Gait assistance: Min assist Gait Distance (Feet): 90 Feet Assistive device:  Rolling walker (2 wheeled) Gait Pattern/deviations: Step-through pattern Gait velocity: decreased   General Gait Details: cues for safe step pattern in RW and to maintain safe proximity to RW, no overt LOB noted, assist required intermittently for safe management of RW to negotiate turns and obstacles  Science writer    Modified Rankin (Stroke Patients Only)       Balance Overall balance assessment: Needs assistance Sitting-balance support: No upper extremity supported;Feet supported Sitting balance-Leahy Scale: Good     Standing balance support: During functional activity;Bilateral upper extremity supported Standing balance-Leahy Scale: Fair              Pertinent Vitals/Pain Pain Assessment: 0-10 Pain Score: 7  Pain Location: Lt hip Pain Descriptors / Indicators: Sore Pain Intervention(s): Limited activity within patient's tolerance;Monitored during session;Repositioned;Ice applied    Home Living Family/patient expects to be discharged to:: Private residence Living Arrangements: Alone Available Help at Discharge: Family;Available PRN/intermittently Type of Home: House Home Access: Stairs to enter;Ramped entrance Entrance Stairs-Rails: Right Entrance Stairs-Number of Steps: 7 Home Layout: One level Home Equipment: Grab bars - tub/shower;Shower seat - built in;Grab bars - toilet;Walker - 2 wheels;Cane - single point;Bedside commode Additional Comments: pt's son is going to stay 1 night and her sister-in-law a couple of nights after    Prior Function Level of Independence: Independent               Hand Dominance   Dominant Hand: Right    Extremity/Trunk Assessment   Upper Extremity Assessment Upper Extremity Assessment: Overall  WFL for tasks assessed    Lower Extremity Assessment Lower Extremity Assessment: Overall WFL for tasks assessed;LLE deficits/detail LLE Sensation: WNL LLE Coordination: WNL    Cervical / Trunk  Assessment Cervical / Trunk Assessment: Normal  Communication   Communication: No difficulties  Cognition Arousal/Alertness: Awake/alert Behavior During Therapy: WFL for tasks assessed/performed Overall Cognitive Status: Within Functional Limits for tasks assessed       General Comments      Exercises Total Joint Exercises Ankle Circles/Pumps: AROM;15 reps;Seated;Both Heel Slides: AAROM;10 reps;Seated;Left   Assessment/Plan    PT Assessment Patient needs continued PT services  PT Problem List Decreased strength;Decreased range of motion;Decreased balance;Decreased activity tolerance;Decreased mobility;Decreased knowledge of use of DME       PT Treatment Interventions DME instruction;Stair training;Therapeutic activities;Balance training;Therapeutic exercise;Functional mobility training;Gait training;Patient/family education;Modalities    PT Goals (Current goals can be found in the Care Plan section)  Acute Rehab PT Goals Patient Stated Goal: to improve walking without pain PT Goal Formulation: With patient Time For Goal Achievement: 09/30/19 Potential to Achieve Goals: Good    Frequency 7X/week    AM-PAC PT "6 Clicks" Mobility  Outcome Measure Help needed turning from your back to your side while in a flat bed without using bedrails?: A Little Help needed moving from lying on your back to sitting on the side of a flat bed without using bedrails?: A Little Help needed moving to and from a bed to a chair (including a wheelchair)?: A Little Help needed standing up from a chair using your arms (e.g., wheelchair or bedside chair)?: A Little Help needed to walk in hospital room?: A Little Help needed climbing 3-5 steps with a railing? : A Little 6 Click Score: 18    End of Session Equipment Utilized During Treatment: Gait belt Activity Tolerance: Patient tolerated treatment well Patient left: in chair;with call bell/phone within reach;with chair alarm set Nurse  Communication: Mobility status PT Visit Diagnosis: Muscle weakness (generalized) (M62.81);Difficulty in walking, not elsewhere classified (R26.2)    Time: [id number] PT Time Calculation (min) (ACUTE ONLY): 29 min   Charges:   PT Evaluation $PT Eval Low Complexity: 1 Low PT Treatments $Gait Training: 8-22 mins        Kipp Brood, PT, DPT Physical Therapist with [facility]  09/23/2019 3:16 PM

## 2019-09-23 NOTE — Op Note (Signed)
PATIENT ID:      Haley Peterson  MRN:     [id number] DOB/AGE:    73-Oct-1947 / 73 y.o.  OPERATIVE REPORT   DATE OF PROCEDURE:  09/23/2019      PREOPERATIVE DIAGNOSIS:  Left Hip Osteoarthritis                                                         POSTOPERATIVE DIAGNOSIS:  Left Hip Osteoarthritis                                                          PROCEDURE: Anterior L total hip arthroplasty using a 52 mm DePuy Pinnacle  Cup, [employer], 0-degree polyethylene liner, a +1.5 mm x 49mm ceramic head, a 6hi Depuy Actis stem  SURGEON: Kerin Salen  ASSISTANT:   Kerry Hough. [employer]  (present throughout entire procedure and necessary for timely completion of the procedure)   ANESTHESIA: Spinal, Exparel 133mg  injection BLOOD LOSS: 300 cc FLUID REPLACEMENT: 1600 cc crystalloid TRANEXAMIC ACID: 1gm IV, 2gm Topical COMPLICATIONS: none    INDICATIONS FOR PROCEDURE: A 73 y.o. year-old With  Left Hip Osteoarthritis   for 3 years, x-rays show bone-on-bone arthritic changes, and osteophytes. Despite conservative measures with observation, anti-inflammatory medicine, narcotics, use of a cane, has severe unremitting pain and can ambulate only a few blocks before resting. Patient desires elective L total hip arthroplasty to decrease pain and increase function. The risks, benefits, and alternatives were discussed at length including but not limited to the risks of infection, bleeding, nerve injury, stiffness, blood clots, the need for revision surgery, cardiopulmonary complications, among others, and they were willing to proceed. Questions answered      PROCEDURE IN DETAIL: The patient was identified by armband,   received preoperative IV antibiotics in the holding area at [facility], taken to the operating room , appropriate anesthetic monitors   were attached and anesthesia was induced with the patient on the gurney. HANA boots were applied to the feet, and the patient  was transferred  to the HANA table with a peroneal post and support underneath the non-operative leg. Theoperative lower extremity was then prepped and draped in the usual sterile fashion from just above the iliac crest to the knee. And a timeout procedure was performed. Skin along incision area was injected with 10 cc of Exparel solution. We then made a 14 cm incision along the interval at the leading edge of the tensor fascia lata of starting at 2 cm lateral to the ASIS. Small bleeders in the skin and subcutaneous tissue identified and cauterized we dissected down to the fascia and made an incision in the fascia allowing [place] to elevate the fascia of the tensor muscle and exploited the interval between the rectus and the tensor fascia lata. A Cobra retractor was then placed along the superior neck of the femur. A cerebellar retractor was used to expose the interval between the tensor fascia lata and the rectus femoris.  We identified and cauterized the ascending branch of the anterior circumflex artery. A second Cobra retractor along the inferior neck  of the femur. A small Hohmann retractor was placed underneath the origin of the rectus femoris, giving [place] good medial exposure. Using Ronguers fatty tissue was removed from in front of the anterior capsule. The capsule was then incised, starting out at the superior anterior rim of the acetabulum going laterally along the anterior neck. The capsule was then teed along the neck superiorly and inferiorly. Electrocautery was used to release capsule from the anterior and medial neck of the femur to allow external rotation. Cobra retractors were then placed along the inferior and superior neck allowing [place] to perform a standard neck cut and removed the femoral head with a power corkscrew. We then placed a medium bent homan retractor in the cotyloid notch and posteriorly along the acetabular rim a narrow Cobra retractor. Exposed labral tissue and osteophytes were then removed. We then  sequentially reamed up to a 51 mm basket reamer obtaining good coverage in all quadrants, verified by C-arm imaging. Under C-arm control we then hammered into place a 52 mm Pinnacle cup in 45 of abduction and 15 of anteversion. The cup seated nicely and required no supplemental screws. We then placed a central hole Eliminator and a 0 polyethylene liner. The foot was then externally rotated to 130-140. The limb was extended and adducted to the floor, delivering the proximal femur up into the wound. A medium curved Hohmann retractor was placed over the greater trochanter and a long Homan retractor along the posterior femoral neck completing the exposure and lateralizing the femur. We then performed releases superiorly and and inferiorly of the capsule going back to the pirformis fossa superiorly and to the lesser trochanter inferiorly. We then entered the proximal femur with the box cutting offset chisel followed by, a canal sounder, the chili pepper and broaching up to a 6 broach. This seated nicely and we reamed the calcar. A trial reduction was performed with a 1.5 mm X 36 mm head.The limb lengths were excellent the hip was stable in 90 of external rotation. At this point the trial components removed and we hammered into place a # 6 hi  Offset Actis stem with Gryption coating. A + 1.5 mm x 36 ceramic head was then hammered into place. The hip was reduced and final C-arm images obtained. The wound was thoroughly irrigated with normal saline solution. We repaired the ant capsule and the tensor fascia lot a with running 0 vicryl suture. the subcutaneous tissue was closed with 2-0 and 3-0 Vicryl suture followed by an Aquacil dressing. At this point the patient was awaken and transferred to hospital gurney without difficulty.   Kerin Salen 09/23/2019, 7:07 AM

## 2019-09-23 NOTE — Anesthesia Procedure Notes (Signed)
Spinal  Patient location during procedure: OR Start time: 09/23/2019 7:25 AM End time: 09/23/2019 7:32 AM Staffing Anesthesiologist: Roderic Palau, MD Performed: anesthesiologist  Preanesthetic Checklist Completed: patient identified, surgical consent, pre-op evaluation, timeout performed, IV checked, risks and benefits discussed and monitors and equipment checked Spinal Block Patient position: sitting Prep: DuraPrep Patient monitoring: cardiac monitor, continuous pulse ox and blood pressure Approach: midline Location: L3-4 Injection technique: single-shot Needle Needle type: Quincke  Needle gauge: 22 G Needle length: 9 cm Assessment Sensory level: T8 Additional Notes Functioning IV was confirmed and monitors were applied. Sterile prep and drape, including hand hygiene and sterile gloves were used. The patient was positioned and the spine was prepped. The skin was anesthetized with lidocaine.  Free flow of clear CSF was obtained prior to injecting local anesthetic into the CSF.  The spinal needle aspirated freely following injection.  The needle was carefully withdrawn.  The patient tolerated the procedure well. CRNA attempted at L2-3 prior to my attempt.

## 2019-09-23 NOTE — Interval H&P Note (Signed)
History and Physical Interval Note:  09/23/2019 7:06 AM  Haley Peterson  has presented today for surgery, with the diagnosis of Left Hip Osteoarthritis.  The various methods of treatment have been discussed with the patient and family. After consideration of risks, benefits and other options for treatment, the patient has consented to  Procedure(s): Left Anterior Hip Arthroplasty (Left) as a surgical intervention.  The patient's history has been reviewed, patient examined, no change in status, stable for surgery.  I have reviewed the patient's chart and labs.  Questions were answered to the patient's satisfaction.     Kerin Salen

## 2019-09-23 NOTE — Transfer of Care (Signed)
Immediate Anesthesia Transfer of Care Note  Patient: Haley Peterson  Procedure(s) Performed: Left Anterior Hip Arthroplasty (Left )  Patient Location: PACU  Anesthesia Type:Spinal  Level of Consciousness: sedated, patient cooperative and responds to stimulation  Airway & Oxygen Therapy: Patient Spontanous Breathing and Patient connected to face mask oxygen  Post-op Assessment: Report given to RN and Post -op Vital signs reviewed and stable  Post vital signs: Reviewed and stable  Last Vitals:  Vitals Value Taken Time  BP 92/45 09/23/19 0930  Temp    Pulse 67 09/23/19 0930  Resp 10 09/23/19 0930  SpO2 100 % 09/23/19 0930  Vitals shown include unvalidated device data.  Last Pain:  Vitals:   09/23/19 0557  TempSrc:   PainSc: 0-No pain         Complications: No apparent anesthesia complications

## 2019-09-23 NOTE — Anesthesia Postprocedure Evaluation (Signed)
Anesthesia Post Note  Patient: Haley Peterson  Procedure(s) Performed: Left Anterior Hip Arthroplasty (Left )     Patient location during evaluation: PACU Anesthesia Type: Spinal Level of consciousness: oriented and awake and alert Pain management: pain level controlled Vital Signs Assessment: post-procedure vital signs reviewed and stable Respiratory status: spontaneous breathing, respiratory function stable and patient connected to nasal cannula oxygen Cardiovascular status: blood pressure returned to baseline and stable Postop Assessment: no headache, no backache, no apparent nausea or vomiting, spinal receding and patient able to bend at knees Anesthetic complications: no    Last Vitals:  Vitals:   09/23/19 1045 09/23/19 1102  BP: (!) 109/51 108/60  Pulse: 65 68  Resp: 11 18  Temp: (!) 36.4 C (!) 36.4 C  SpO2: 98% 100%    Last Pain:  Vitals:   09/23/19 1102  TempSrc: Oral  PainSc: 0-No pain                 Kaydi Kley,W. EDMOND

## 2019-09-24 ENCOUNTER — Encounter (HOSPITAL_COMMUNITY): Payer: Self-pay | Admitting: Orthopedic Surgery

## 2019-09-24 DIAGNOSIS — M1612 Unilateral primary osteoarthritis, left hip: Secondary | ICD-10-CM | POA: Diagnosis not present

## 2019-09-24 LAB — BASIC METABOLIC PANEL
Anion gap: 8 (ref 5–15)
BUN: 18 mg/dL (ref 8–23)
CO2: 21 mmol/L — ABNORMAL LOW (ref 22–32)
Calcium: 7.7 mg/dL — ABNORMAL LOW (ref 8.9–10.3)
Chloride: 102 mmol/L (ref 98–111)
Creatinine, Ser: 1.48 mg/dL — ABNORMAL HIGH (ref 0.44–1.00)
GFR calc Af Amer: 40 mL/min — ABNORMAL LOW (ref >60)
GFR calc non Af Amer: 35 mL/min — ABNORMAL LOW (ref >60)
Glucose, Bld: 136 mg/dL — ABNORMAL HIGH (ref 70–99)
Potassium: 3.9 mmol/L (ref 3.5–5.1)
Sodium: 131 mmol/L — ABNORMAL LOW (ref 135–145)

## 2019-09-24 LAB — CBC
HCT: 33 % — ABNORMAL LOW (ref 36.0–46.0)
Hemoglobin: 10.4 g/dL — ABNORMAL LOW (ref 12.0–15.0)
MCH: 28.7 pg (ref 26.0–34.0)
MCHC: 31.5 g/dL (ref 30.0–36.0)
MCV: 91.2 fL (ref 80.0–100.0)
Platelets: 270 10*3/uL (ref 150–400)
RBC: 3.62 MIL/uL — ABNORMAL LOW (ref 3.87–5.11)
RDW: 13.1 % (ref 11.5–15.5)
WBC: 18.6 10*3/uL — ABNORMAL HIGH (ref 4.0–10.5)
nRBC: 0 % (ref 0.0–0.2)

## 2019-09-24 NOTE — Plan of Care (Signed)

## 2019-09-24 NOTE — Progress Notes (Signed)
PATIENT ID: Haley Peterson  MRN: [id number]  DOB/AGE:  December 05, 1945 / 73 y.o.  1 Day Post-Op Procedure(s) (LRB): Left Anterior Hip Arthroplasty (Left)    PROGRESS NOTE Subjective: Patient is alert, oriented, no Nausea, no Vomiting, yes passing gas, . Taking PO well. Denies SOB, Chest or Calf Pain. Using Incentive Spirometer, PAS in place. Ambulate 16' Patient reports pain as  2/10  .    Objective: Vital signs in last 24 hours: Vitals:   09/24/19 0144 09/24/19 0210 09/24/19 0456 09/24/19 0504  BP: (Abnormal) 92/41 (Abnormal) 102/40 (Abnormal) 97/42 (Abnormal) 105/48  Pulse: 94  80   Resp: 16  16   Temp: 98.5 F (36.9 C)  97.9 F (36.6 C)   TempSrc: Oral  Oral   SpO2: 96%  94%   Weight:      Height:          Intake/Output from previous day: I/O last 3 completed shifts: In: 3977.2 [P.O.:240; I.V.:3637.2; IV Piggyback:100] Out: 1500 [Urine:1200; Blood:300]   Intake/Output this shift: No intake/output data recorded.   LABORATORY DATA: Recent Labs    09/24/19 0233  WBC 18.6*  HGB 10.4*  HCT 33.0*  PLT 270  NA 131*  K 3.9  CL 102  CO2 21*  BUN 18  CREATININE 1.48*  GLUCOSE 136*  CALCIUM 7.7*    Examination: Neurologically intact ABD soft Neurovascular intact Sensation intact distally Intact pulses distally Dorsiflexion/Plantar flexion intact Incision: dressing C/D/I No cellulitis present Compartment soft} XR AP&Lat of hip shows well placed\fixed THA  Assessment:   1 Day Post-Op Procedure(s) (LRB): Left Anterior Hip Arthroplasty (Left) ADDITIONAL DIAGNOSIS:  Expected Acute Blood Loss Anemia, history of breast cancer  Patient's anticipated LOS is less than 2 midnights, meeting these requirements: - Younger than 41 - Lives within 1 hour of care - Has a competent adult at home to recover with post-op recover - NO history of  - Chronic pain requiring opiods  - Diabetes  - Coronary Artery Disease  - Heart failure  - Heart attack  - Stroke  - DVT/VTE  -  Cardiac arrhythmia  - Respiratory Failure/COPD  - Renal failure  - Anemia  - Advanced Liver disease       Plan: PT/OT WBAT, THA  DVT Prophylaxis: SCDx72 hrs, ASA 81 mg BID x 2 weeks  DISCHARGE PLAN: Home, today if patient passes PT  DISCHARGE NEEDS: HHPT, Walker and 3-in-1 comode seatPatient ID: Haley Peterson, female   DOB: April 21, 1946, 74 y.o.   MRN: [id number]

## 2019-09-24 NOTE — Progress Notes (Signed)
Physical Therapy Treatment Patient Details Name: Haley Peterson MRN: [id number] DOB: 01-02-1946 Today's Date: 09/24/2019    History of Present Illness Patient is 73 y.o. female s/p Lt THA anterior approach with PMH significant for HTN, breast cancer, CKD's, OA, and bil TKA's, Rt THA.    PT Comments    POD # 1 pm session Pt OOB sleeping in recliner.  Assisted with amb a further distance.  General Gait Details: 25% VC's on proper upright posture and safety with turns. Then returned to room to perform some TE's following HEP handout.  Instructed on proper tech, freq as well as use of ICE.   Addressed all mobility questions, discussed appropriate activity, educated on use of ICE.  Pt ready for D/C to home.   Follow Up Recommendations  Follow surgeon's recommendation for DC plan and follow-up therapies(HEP)     Equipment Recommendations  None recommended by PT(has from prior)    Recommendations for Other Services       Precautions / Restrictions Precautions Precautions: Fall Restrictions Weight Bearing Restrictions: No    Mobility  Bed Mobility Overal bed mobility: Needs Assistance Bed Mobility: Supine to Sit     Supine to sit: Min guard;Supervision     General bed mobility comments: OOB in recliner  Transfers Overall transfer level: Needs assistance Equipment used: Rolling walker (2 wheeled) Transfers: Sit to/from Stand Sit to Stand: Supervision;Min guard         General transfer comment: 25% VC's on proper hand placemen, safety with turns and increased time  Ambulation/Gait Ambulation/Gait assistance: Supervision;Min guard Gait Distance (Feet): 82 Feet Assistive device: Rolling walker (2 wheeled) Gait Pattern/deviations: Step-through pattern Gait velocity: decreased   General Gait Details: 25% VC's on proper upright posture and safety with turns.   Stairs Stairs: (pt stated she has a ramp)           Wheelchair Mobility    Modified Rankin (Stroke  Patients Only)       Balance                                            Cognition Arousal/Alertness: Awake/alert Behavior During Therapy: WFL for tasks assessed/performed Overall Cognitive Status: Within Functional Limits for tasks assessed                                        Exercises 5 resps of all standing TE's   General Comments        Pertinent Vitals/Pain Pain Assessment: 0-10 Pain Score: 6  Pain Location: Lt hip with amb (no c/o pain at rest) Pain Descriptors / Indicators: Sore;Discomfort;Grimacing;Operative site guarding Pain Intervention(s): Monitored during session;Premedicated before session;Repositioned;Ice applied    Home Living                      Prior Function            PT Goals (current goals can now be found in the care plan section) Progress towards PT goals: Progressing toward goals    Frequency    7X/week      PT Plan Current plan remains appropriate    Co-evaluation              AM-PAC PT "6 Clicks" Mobility   Outcome Measure  Help needed turning from your back to your side while in a flat bed without using bedrails?: A Little Help needed moving from lying on your back to sitting on the side of a flat bed without using bedrails?: A Little Help needed moving to and from a bed to a chair (including a wheelchair)?: A Little Help needed standing up from a chair using your arms (e.g., wheelchair or bedside chair)?: A Little Help needed to walk in hospital room?: A Little Help needed climbing 3-5 steps with a railing? : A Little 6 Click Score: 18    End of Session Equipment Utilized During Treatment: Gait belt Activity Tolerance: Patient tolerated treatment well Patient left: in chair;with call bell/phone within reach;with chair alarm set Nurse Communication: Mobility status PT Visit Diagnosis: Muscle weakness (generalized) (M62.81);Difficulty in walking, not elsewhere classified  (R26.2)     Time: [id number] PT Time Calculation (min) (ACUTE ONLY): 23 min  Charges:  $Gait Training: 8-22 mins $Therapeutic Exercise: 8-22 mins                     Rica Koyanagi  PTA Acute  Rehabilitation Services Pager      [phone] Office      [phone]

## 2019-09-24 NOTE — Progress Notes (Signed)
Physical Therapy Treatment Patient Details Name: Haley Peterson MRN: [id number] DOB: Jul 06, 1946 Today's Date: 09/24/2019    History of Present Illness Patient is 73 y.o. female s/p Lt THA anterior approach with PMH significant for HTN, breast cancer, CKD's, OA, and bil TKA's, Rt THA.    PT Comments    POD #1 am session Assisted OOB.  General bed mobility comments: demonstarted and instructed how to use belt to self assist LE off bed.  Required 25% VC's on proper tech and increased time. General transfer comment: 25% VC's on proper hand placemen, safety with turns and increased time.  General Gait Details: 25% VC's on proper upright posture and safety with turns. Then returned to room to perform some TE's following HEP handout.  Instructed on proper tech, freq as well as use of ICE.   Pt will need one more PT session prior to D/C.   Follow Up Recommendations  Follow surgeon's recommendation for DC plan and follow-up therapies(HEP)     Equipment Recommendations  None recommended by PT(has from prior)    Recommendations for Other Services       Precautions / Restrictions Precautions Precautions: Fall Restrictions Weight Bearing Restrictions: No    Mobility  Bed Mobility Overal bed mobility: Needs Assistance Bed Mobility: Supine to Sit     Supine to sit: Min guard;Supervision     General bed mobility comments: demonstarted and instructed how to use belt to self assist LE off bed.  Required 25% VC's on proper tech and increased time.  Transfers Overall transfer level: Needs assistance Equipment used: Rolling walker (2 wheeled) Transfers: Sit to/from Stand Sit to Stand: Supervision;Min guard         General transfer comment: 25% VC's on proper hand placemen, safety with turns and increased time  Ambulation/Gait Ambulation/Gait assistance: Supervision;Min guard Gait Distance (Feet): 75 Feet Assistive device: Rolling walker (2 wheeled) Gait Pattern/deviations:  Step-through pattern Gait velocity: decreased   General Gait Details: 25% VC's on proper upright posture and safety with turns.   Stairs Stairs: (pt stated she has a ramp)           Wheelchair Mobility    Modified Rankin (Stroke Patients Only)       Balance                                            Cognition Arousal/Alertness: Awake/alert Behavior During Therapy: WFL for tasks assessed/performed Overall Cognitive Status: Within Functional Limits for tasks assessed                                        Exercises Total Joint Exercises Ankle Circles/Pumps: AROM;Seated;Both;10 reps Quad Sets: AROM;Both;Seated;10 reps Gluteal Sets: AROM;Seated;Both;10 reps Short Arc Quad: AROM;Left;5 reps;Seated Heel Slides: AAROM;Seated;Left;5 reps Hip ABduction/ADduction: AAROM;Left;5 reps Long Arc Quad: AROM;Left;5 reps    General Comments        Pertinent Vitals/Pain Pain Assessment: 0-10 Pain Score: 6  Pain Location: Lt hip with amb (no c/o pain at rest) Pain Descriptors / Indicators: Sore;Discomfort;Grimacing;Operative site guarding Pain Intervention(s): Monitored during session;Premedicated before session;Repositioned;Ice applied    Home Living                      Prior Function  PT Goals (current goals can now be found in the care plan section) Progress towards PT goals: Progressing toward goals    Frequency    7X/week      PT Plan Current plan remains appropriate    Co-evaluation              AM-PAC PT "6 Clicks" Mobility   Outcome Measure  Help needed turning from your back to your side while in a flat bed without using bedrails?: A Little Help needed moving from lying on your back to sitting on the side of a flat bed without using bedrails?: A Little Help needed moving to and from a bed to a chair (including a wheelchair)?: A Little Help needed standing up from a chair using your arms  (e.g., wheelchair or bedside chair)?: A Little Help needed to walk in hospital room?: A Little Help needed climbing 3-5 steps with a railing? : A Little 6 Click Score: 18    End of Session Equipment Utilized During Treatment: Gait belt Activity Tolerance: Patient tolerated treatment well Patient left: in chair;with call bell/phone within reach;with chair alarm set Nurse Communication: Mobility status PT Visit Diagnosis: Muscle weakness (generalized) (M62.81);Difficulty in walking, not elsewhere classified (R26.2)     Time: [id number] PT Time Calculation (min) (ACUTE ONLY): 25 min  Charges:  $Gait Training: 8-22 mins $Therapeutic Exercise: 8-22 mins                     Rica Koyanagi  PTA Acute  Rehabilitation Services Pager      [phone] Office      [phone]

## 2019-09-24 NOTE — Discharge Summary (Signed)
Patient ID: Haley Peterson MRN: [id number] DOB/AGE: Apr 07, 1946 73 y.o.  Admit date: 09/23/2019 Discharge date: 09/24/2019  Admission Diagnoses:  Principal Problem:   Osteoarthritis of left hip Active Problems:   S/P hip replacement, left   Discharge Diagnoses:  Same  Past Medical History:  Diagnosis Date  . Arthritis   . Bone spur    left heel  . Breast cancer ([place])    bilateral  . Cancer ([place])    Bilateral Breast cancer  . Cataract    right eye   . CKD (chronic kidney disease), stage III    mgd by PCP   . Dyspnea    recent last 2 months when walk to mail box- "winded" less active -no exercise 2 months   . History of breast cancer    left, right  . Hypertension   . Personal history of radiation therapy   . Pneumonia 10/2012    Surgeries: Procedure(s): Left Anterior Hip Arthroplasty on 09/23/2019   Consultants:   Discharged Condition: Improved  Hospital Course: Anetta PRAJNA THRONEBERRY is an 73 y.o. female who was admitted 09/23/2019 for operative treatment ofOsteoarthritis of left hip. Patient has severe unremitting pain that affects sleep, daily activities, and work/hobbies. After pre-op clearance the patient was taken to the operating room on 09/23/2019 and underwent  Procedure(s): Left Anterior Hip Arthroplasty.    Patient was given perioperative antibiotics:  Anti-infectives (From admission, onward)   Start     Dose/Rate Route Frequency Ordered Stop   09/23/19 0600  vancomycin (VANCOCIN) IVPB 1000 mg/200 mL premix     1,000 mg 200 mL/hr over 60 Minutes Intravenous On call to O.R. 09/23/19 0533 09/23/19 [id number]       Patient was given sequential compression devices, early ambulation, and chemoprophylaxis to prevent DVT.  Patient benefited maximally from hospital stay and there were no complications.    Recent vital signs:  Patient Vitals for the past 24 hrs:  BP Temp Temp src Pulse Resp SpO2  09/24/19 0945 (!) 115/49 98.1 F (36.7 C) Oral 92 18 100 %  09/24/19 0504 (!)  105/48 - - - - -  09/24/19 0456 (!) 97/42 97.9 F (36.6 C) Oral 80 16 94 %  09/24/19 0210 (!) 102/40 - - - - -  09/24/19 0144 (!) 92/41 98.5 F (36.9 C) Oral 94 16 96 %  09/23/19 2305 (!) 121/42 98.7 F (37.1 C) Oral (!) 103 16 96 %  09/23/19 1755 (!) 126/51 98.4 F (36.9 C) Oral (!) 103 18 100 %  09/23/19 1407 (!) 118/53 98.3 F (36.8 C) Oral 81 16 100 %  09/23/19 1305 (!) 109/48 97.7 F (36.5 C) Oral 80 16 100 %     Recent laboratory studies:  Recent Labs    09/24/19 0233  WBC 18.6*  HGB 10.4*  HCT 33.0*  PLT 270  NA 131*  K 3.9  CL 102  CO2 21*  BUN 18  CREATININE 1.48*  GLUCOSE 136*  CALCIUM 7.7*     Discharge Medications:   Allergies as of 09/24/2019      Reactions   Penicillins Other (See Comments)   "LOSS OF VISION" Has patient had a PCN reaction causing immediate rash, facial/tongue/throat swelling, SOB or lightheadedness with hypotension: No Has patient had a PCN reaction causing severe rash involving mucus membranes or skin necrosis: No Has patient had a PCN reaction that required hospitalization: No Has patient had a PCN reaction occurring within the last 10 years: No If all  of the above answers are "NO", then may proceed with Cephalosporin use.   Oxytetracycline Other (See Comments)   Fever blisters   Tamoxifen Other (See Comments)   "GENERAL ILLNESS WITH LIGHTHEADNESS"   Aromasin [exemestane] Other (See Comments)   UNSPECIFIED REACTION  Pt is unsure of what reaction was...   Codeine Other (See Comments)   LIGHTHEADED      Medication List    STOP taking these medications   ibuprofen 200 MG tablet Commonly known as: ADVIL   naproxen sodium 220 MG tablet Commonly known as: ALEVE     TAKE these medications   aspirin EC 81 MG tablet Take 1 tablet (81 mg total) by mouth 2 (two) times daily.   CALTRATE 600 PLUS-VIT D PO Take 1 tablet by mouth daily with lunch.   cholecalciferol 1000 units tablet Commonly known as: VITAMIN D Take 1,000  Units by mouth daily with lunch.   GARLIQUE PO Take 1 tablet by mouth daily with lunch.   hydrochlorothiazide 12.5 MG tablet Commonly known as: HYDRODIURIL Take 12.5 mg by mouth daily.   losartan 100 MG tablet Commonly known as: COZAAR Take 100 mg by mouth daily.   oxyCODONE-acetaminophen 5-325 MG tablet Commonly known as: PERCOCET/ROXICET Take 1 tablet by mouth every 4 (four) hours as needed for severe pain.   tiZANidine 2 MG tablet Commonly known as: ZANAFLEX Take 1 tablet (2 mg total) by mouth every 6 (six) hours as needed.   TURMERIC PO Take 3 capsules by mouth daily with lunch.   vitamin C 1000 MG tablet Take 1,000 mg by mouth daily with lunch.            Durable Medical Equipment  (From admission, onward)         Start     Ordered   09/23/19 1058  DME Walker rolling  Once    Question:  Patient needs a walker to treat with the following condition  Answer:  Status post total hip replacement, left   09/23/19 1057   09/23/19 1058  DME 3 n 1  Once     09/23/19 1057           Discharge Care Instructions  (From admission, onward)         Start     Ordered   09/24/19 0000  Weight bearing as tolerated     09/24/19 1244          Diagnostic Studies: Dg C-arm 1-60 Min-no Report  Result Date: 09/23/2019 Fluoroscopy was utilized by the requesting physician.  No radiographic interpretation.   Mm 3d Screen Breast Uni Left  Result Date: 08/28/2019 CLINICAL DATA:  Screening. EXAM: DIGITAL SCREENING UNILATERAL LEFT MAMMOGRAM WITH CAD AND TOMO COMPARISON:  Previous exam(s). ACR Breast Density Category b: There are scattered areas of fibroglandular density. FINDINGS: The patient has had a right mastectomy. There are no findings suspicious for malignancy. Images were processed with CAD. IMPRESSION: No mammographic evidence of malignancy. Lumpectomy changes. A result letter of this screening mammogram will be mailed directly to the patient. RECOMMENDATION: Screening  mammogram in one year.  (Code:[id number]-L-71M) BI-RADS CATEGORY  1: Negative. Electronically Signed   By: Curlene Dolphin M.D.   On: 08/28/2019 11:29   Dg Hip Operative Unilat W Or W/o Pelvis Left  Result Date: 09/23/2019 CLINICAL DATA:  Left hip replacement EXAM: OPERATIVE left HIP (WITH PELVIS IF PERFORMED) 1 VIEW TECHNIQUE: Fluoroscopic spot image(s) were submitted for interpretation post-operatively. COMPARISON:  None. FINDINGS: Radiographs demonstrate left  total hip arthroplasty. Partially imaged prior right hip arthroplasty. Fluoroscopy time: 14 seconds IMPRESSION: Post left total hip arthroplasty. Please refer to operative note for details. Electronically Signed   By: Macy Mis M.D.   On: 09/23/2019 09:08    Disposition: Discharge disposition: 01-Home or Self Care       Discharge Instructions    Call MD / Call 911   Complete by: As directed    If you experience chest pain or shortness of breath, CALL 911 and be transported to the hospital emergency room.  If you develope a fever above 101 F, pus (white drainage) or increased drainage or redness at the wound, or calf pain, call your surgeon's office.   Constipation Prevention   Complete by: As directed    Drink plenty of fluids.  Prune juice may be helpful.  You may use a stool softener, such as Colace (over the counter) 100 mg twice a day.  Use MiraLax (over the counter) for constipation as needed.   Diet - low sodium heart healthy   Complete by: As directed    Driving restrictions   Complete by: As directed    No driving for 2 weeks   Increase activity slowly as tolerated   Complete by: As directed    Patient may shower   Complete by: As directed    You may shower without a dressing once there is no drainage.  Do not wash over the wound.  If drainage remains, cover wound with plastic wrap and then shower.   Weight bearing as tolerated   Complete by: As directed       Follow-up Information    Frederik Pear, MD In 2 weeks.    Specialty: Orthopedic Surgery Contact information: [place] [address] [phone]        [facility] Follow up.   Specialty: [employer] Contact information: [address] [place] [address] [phone]            Signed: Joanell Rising 09/24/2019, 12:44 PM

## 2019-09-24 NOTE — Progress Notes (Signed)
Therapy Plan: Prearranged [facility] [address] Has DME

## 2019-09-26 DIAGNOSIS — H269 Unspecified cataract: Secondary | ICD-10-CM | POA: Diagnosis not present

## 2019-09-26 DIAGNOSIS — I129 Hypertensive chronic kidney disease with stage 1 through stage 4 chronic kidney disease, or unspecified chronic kidney disease: Secondary | ICD-10-CM | POA: Diagnosis not present

## 2019-09-26 DIAGNOSIS — Z9181 History of falling: Secondary | ICD-10-CM | POA: Diagnosis not present

## 2019-09-26 DIAGNOSIS — Z87891 Personal history of nicotine dependence: Secondary | ICD-10-CM | POA: Diagnosis not present

## 2019-09-26 DIAGNOSIS — Z96643 Presence of artificial hip joint, bilateral: Secondary | ICD-10-CM | POA: Diagnosis not present

## 2019-09-26 DIAGNOSIS — Z853 Personal history of malignant neoplasm of breast: Secondary | ICD-10-CM | POA: Diagnosis not present

## 2019-09-26 DIAGNOSIS — N183 Chronic kidney disease, stage 3 unspecified: Secondary | ICD-10-CM | POA: Diagnosis not present

## 2019-09-26 DIAGNOSIS — Z471 Aftercare following joint replacement surgery: Secondary | ICD-10-CM | POA: Diagnosis not present

## 2019-10-01 DIAGNOSIS — I129 Hypertensive chronic kidney disease with stage 1 through stage 4 chronic kidney disease, or unspecified chronic kidney disease: Secondary | ICD-10-CM | POA: Diagnosis not present

## 2019-10-01 DIAGNOSIS — H269 Unspecified cataract: Secondary | ICD-10-CM | POA: Diagnosis not present

## 2019-10-01 DIAGNOSIS — Z96643 Presence of artificial hip joint, bilateral: Secondary | ICD-10-CM | POA: Diagnosis not present

## 2019-10-01 DIAGNOSIS — Z471 Aftercare following joint replacement surgery: Secondary | ICD-10-CM | POA: Diagnosis not present

## 2019-10-01 DIAGNOSIS — N183 Chronic kidney disease, stage 3 unspecified: Secondary | ICD-10-CM | POA: Diagnosis not present

## 2019-10-01 DIAGNOSIS — Z853 Personal history of malignant neoplasm of breast: Secondary | ICD-10-CM | POA: Diagnosis not present

## 2019-10-01 DIAGNOSIS — Z9181 History of falling: Secondary | ICD-10-CM | POA: Diagnosis not present

## 2019-10-01 DIAGNOSIS — Z87891 Personal history of nicotine dependence: Secondary | ICD-10-CM | POA: Diagnosis not present

## 2019-10-07 ENCOUNTER — Other Ambulatory Visit: Payer: Self-pay

## 2019-10-08 DIAGNOSIS — Z471 Aftercare following joint replacement surgery: Secondary | ICD-10-CM | POA: Diagnosis not present

## 2019-10-08 DIAGNOSIS — Z96642 Presence of left artificial hip joint: Secondary | ICD-10-CM | POA: Diagnosis not present

## 2019-10-08 DIAGNOSIS — M1612 Unilateral primary osteoarthritis, left hip: Secondary | ICD-10-CM | POA: Diagnosis not present

## 2019-10-09 DIAGNOSIS — R9431 Abnormal electrocardiogram [ECG] [EKG]: Secondary | ICD-10-CM | POA: Diagnosis not present

## 2019-10-09 DIAGNOSIS — Z96659 Presence of unspecified artificial knee joint: Secondary | ICD-10-CM | POA: Diagnosis not present

## 2019-10-09 DIAGNOSIS — I10 Essential (primary) hypertension: Secondary | ICD-10-CM | POA: Diagnosis not present

## 2019-10-09 DIAGNOSIS — E669 Obesity, unspecified: Secondary | ICD-10-CM | POA: Diagnosis not present

## 2019-10-09 DIAGNOSIS — M1710 Unilateral primary osteoarthritis, unspecified knee: Secondary | ICD-10-CM | POA: Diagnosis not present

## 2019-10-09 DIAGNOSIS — Z96641 Presence of right artificial hip joint: Secondary | ICD-10-CM | POA: Diagnosis not present

## 2019-10-09 DIAGNOSIS — R0600 Dyspnea, unspecified: Secondary | ICD-10-CM | POA: Diagnosis not present

## 2019-10-17 ENCOUNTER — Ambulatory Visit (HOSPITAL_COMMUNITY): Payer: PPO | Attending: Orthopedic Surgery | Admitting: Physical Therapy

## 2019-10-17 ENCOUNTER — Other Ambulatory Visit: Payer: Self-pay

## 2019-10-17 ENCOUNTER — Encounter (HOSPITAL_COMMUNITY): Payer: Self-pay | Admitting: Physical Therapy

## 2019-10-17 DIAGNOSIS — M6281 Muscle weakness (generalized): Secondary | ICD-10-CM | POA: Diagnosis not present

## 2019-10-17 DIAGNOSIS — M25551 Pain in right hip: Secondary | ICD-10-CM | POA: Insufficient documentation

## 2019-10-17 DIAGNOSIS — M25552 Pain in left hip: Secondary | ICD-10-CM | POA: Diagnosis not present

## 2019-10-17 DIAGNOSIS — R262 Difficulty in walking, not elsewhere classified: Secondary | ICD-10-CM | POA: Diagnosis not present

## 2019-10-17 NOTE — Patient Instructions (Addendum)
Heel Raise: Bilateral (Standing)    Rise on balls of feet. Repeat _10___ times per set. Do __1__ sets per session. Do ___2_ sessions per day.  http://orth.exer.us/38   Copyright  VHI. All rights reserved.  Functional Quadriceps: Chair Squat    Keeping feet flat on floor, shoulder width apart, squat . Use support as necessary. Repeat _10___ times per set. Do _1___ sets per session. Do _2___ sessions per day.  http://orth.exer.us/736   Copyright  VHI. All rights reserved.  Functional Quadriceps: Sit to Stand    Sit on edge of chair, feet flat on floor. Stand upright, extending knees fully. Repeat __10__ times per set. Do ___1_ sets per session. Do _2___ sessions per day.  http://orth.exer.us/734   Copyright  VHI. All rights reserved.  Bridging    Slowly raise buttocks from floor, keeping stomach tight. Repeat __10__ times per set. Do ___1_ sets per session. Do _2___ sessions per day.  http://orth.exer.us/1096   Copyright  VHI. All rights reserved.

## 2019-10-17 NOTE — Therapy (Signed)
[place], [place], [address] Phone: [phone]   Fax:  [phone]  Physical Therapy Evaluation  Patient Details  Name: Haley Peterson MRN: [id number] Date of Birth: July 14, 1946 Referring Provider (PT): Frederik Pear    Encounter Date: 10/17/2019  PT End of Session - 10/17/19 1125    Visit Number  1    Number of Visits  8    Date for PT Re-Evaluation  11/14/19    Authorization Type  [employer]    Authorization - Visit Number  1    Authorization - Number of Visits  8    PT Start Time  9381    PT Stop Time  1120    PT Time Calculation (min)  40 min    Activity Tolerance  Patient tolerated treatment well       Past Medical History:  Diagnosis Date  . Arthritis   . Bone spur    left heel  . Breast cancer ([place])    bilateral  . Cancer ([place])    Bilateral Breast cancer  . Cataract    right eye   . CKD (chronic kidney disease), stage III    mgd by PCP   . Dyspnea    recent last 2 months when walk to mail box- "winded" less active -no exercise 2 months   . History of breast cancer    left, right  . Hypertension   . Personal history of radiation therapy   . Pneumonia 10/2012    Past Surgical History:  Procedure Laterality Date  . ABDOMINAL HYSTERECTOMY    . BIOPSY  04/12/2017   Procedure: BIOPSY;  Surgeon: Rogene Houston, MD;  [facility];  Service: Endoscopy;;  ascending colon ulcer biopsies  . BREAST LUMPECTOMY  08/28/2001   left  . BREAST RECONSTRUCTION Right    implant  . CHOLECYSTECTOMY  2002  . COLONOSCOPY    . COLONOSCOPY N/A 04/12/2017   Procedure: COLONOSCOPY;  Surgeon: Rogene Houston, MD;  [facility];  Service: Endoscopy;  Laterality: N/A;  830  . MASTECTOMY  08/15/2006   right  . PARTIAL HYSTERECTOMY  1985  . REPLACEMENT TOTAL KNEE  2009, 2010   right-2010, left 2009  . TISSUE EXPANDER PLACEMENT    . TOTAL HIP ARTHROPLASTY Right 10/09/2017   Procedure: RIGHT TOTAL HIP ARTHROPLASTY ANTERIOR APPROACH;  Surgeon: Frederik Pear, MD;  Location: [facility];  Service: Orthopedics;  Laterality: Right;  . TOTAL HIP ARTHROPLASTY Left 09/23/2019   Procedure: Left Anterior Hip Arthroplasty;  Surgeon: Frederik Pear, MD;  Location: [facility];  Service: Orthopedics;  Laterality: Left;    There were no vitals filed for this visit.   Subjective Assessment - 10/17/19 0939    Subjective  Pt states that she has had pain in her left hip for quite a while she recently, 09/23/2019, decided to have a THR.  She has been being seen by [facility] services and has been discharged.  She is now being referred to skilled PT to improve her functioning level.    Pertinent History  s/p RT THR, s/P, TKR, hx of breast cancer    Limitations  Standing;Walking;Lifting    How long can you sit comfortably?  able to sit as long as she wants    How long can you stand comfortably?  20 minutes    How long can you walk comfortably?  walking with a cane able to walk for less  than five minutes due to not really pursuing walking longer.  To  be sleeping in her bed again,(currently in the recliner)    Patient Stated Goals  TO be able to walk without an assistive device.    Currently in Pain?  Yes    Pain Score  5     Pain Location  Leg    Pain Orientation  Left;Anterior    Pain Descriptors / Indicators  Sore    Pain Type  Acute pain    Pain Radiating Towards  main soreness is anterior aspect of her thigh    Pain Onset  1 to 4 weeks ago    Pain Frequency  Constant    Aggravating Factors   walking    Pain Relieving Factors  ice    Effect of Pain on Daily Activities  limits         [facility] PT Assessment - 10/17/19 0001      Assessment   Medical Diagnosis  Lt THR    Referring Provider (PT)  Frederik Pear     Onset Date/Surgical Date  09/23/19    Prior Therapy  home health       Precautions   Precautions  Anterior Hip      Restrictions   Weight Bearing Restrictions  No      Balance  Screen   Has the patient fallen in the past 6 months  No    Has the patient had a decrease in activity level because of a fear of falling?   Yes    Is the patient reluctant to leave their home because of a fear of falling?   No      Home Environment   Living Environment  Private residence    Type of [place] to enter    Entrance Stairs-Number of Steps  [place]  One level      Prior Function   Level of Independence  Independent with community mobility with device      Cognition   Overall Cognitive Status  Within Functional Limits for tasks assessed      Observation/Other Assessments   Focus on Therapeutic Outcomes (FOTO)   39      Functional Tests   Functional tests  Sit to Stand;Single leg stance      Single Leg Stance   Comments  Rt 5" , Lt 4"       Sit to Stand   Comments  8 in 30 seconds       ROM / Strength   AROM / PROM / Strength  Strength      Strength   Strength Assessment Site  Hip;Knee;Ankle    Right/Left Hip  Right;Left    Right Hip Flexion  5/5    Right Hip Extension  3+/5    Left Hip Flexion  4+/5    Left Hip Extension  3-/5    Right/Left Knee  Right;Left    Right Knee Flexion  5/5    Right Knee Extension  5/5    Left Knee Flexion  4/5    Left Knee Extension  4+/5    Right/Left Ankle  Right;Left    Right Ankle Dorsiflexion  3+/5    Left Ankle Dorsiflexion  5/5      Ambulation/Gait   Ambulation Distance (Feet)  206 Feet    Assistive device  Straight cane    Gait Comments  3  minute walk test                 Objective measurements completed on examination: See above findings.      [facility] Adult PT Treatment/Exercise - 10/17/19 0001      Exercises   Exercises  Knee/Hip      Knee/Hip Exercises: Standing   Heel Raises  Both;10 reps    Functional Squat  10 reps      Knee/Hip Exercises: Seated   Sit to Sand  10 reps      Knee/Hip Exercises: Supine   Bridges  Both;10 reps    Bridges Limitations  5"  hold              PT Education - 10/17/19 1124    Education Details  to increase ambulation time at home; pt should be walking for exercise everyday; updated HEP    Person(s) Educated  Patient    Methods  Explanation;Handout    Comprehension  Verbalized understanding;Returned demonstration       PT Short Term Goals - 10/17/19 1537      PT SHORT TERM GOAL #1   Title  Pt to be able to stand without AD for 30 minutes without increased Lt hip pain in order to make a simple meal    Time  2    Period  Weeks    Status  New    Target Date  10/31/19      PT SHORT TERM GOAL #2   Title  PT to be walking in her home without a cane with confidence.    Time  2    Period  Weeks      PT SHORT TERM GOAL #3   Title  PT to be walking to her mailbox and back using her cane without fatigue or increased pain.    Time  2    Period  Weeks    Status  New        PT Long Term Goals - 10/17/19 1540      PT LONG TERM GOAL #1   Title  PT to be able to single leg stance on both LE for at least 30" to allow pt to be confident walking outside without an assistive device.    Time  4    Period  Weeks    Status  New    Target Date  11/14/19      PT LONG TERM GOAL #2   Title  PT strength in core and LE to have increased 1/2 grade to allow pt to be confident walking up and down steps using a handrail in a reciprocal manner.    Time  4    Period  Weeks    Status  New      PT LONG TERM GOAL #3   Title  PT to be able to walk for 30 mintues without having to sit down in order to be able to complete her shopping tasks.    Time  4    Period  Weeks    Status  New      PT LONG TERM GOAL #4   Title  PT to be sleeping in her bed once again    Time  4    Period  Weeks    Status  New             Plan - 10/17/19 1126    Clinical Impression Statement  Ms. Rembert is a 73  yo female who has had a recent Lt THR on 09/23/2019.  She has been discharged from home health and is now being referred to  skilled outpatient PT to return her to her previous level of functionins.  Evaluation demonstrates decreased balance, decreased activity tolerance, decreased velocity with gait, difficulty sleeping in her bed and the pt is ambulating with a cane,(previously no assistive device).  Ms. Tolles will benefit from skilled PT to address these deficiets and maximize her functioning ability.    Personal Factors and Comorbidities  Comorbidity 3+    Comorbidities  Rt THR, TKR, hx of breast cancer.    Examination-Activity Limitations  Bathing;Bed Mobility;Bend;Carry;Dressing;Lift;Locomotion Level;Squat;Stairs;Stand    Examination-Participation Restrictions  Cleaning;Community Activity;Laundry;Driving;Shop;Yard Work    Stability/Clinical Decision Making  Stable/Uncomplicated    Clinical Decision Making  Low    Rehab Potential  Good    PT Frequency  2x / week    PT Duration  4 weeks    PT Treatment/Interventions  Patient/family education;Gait training;Stair training;Functional mobility training;Therapeutic activities;Therapeutic exercise;Balance training    PT Next Visit Plan  begin rocker board, step ups, balance activity, gt with cane increasing velocity, side steps., manual for pain control    PT Home Exercise Plan  heel raise, sit to stand, squat and bridges.       Patient will benefit from skilled therapeutic intervention in order to improve the following deficits and impairments:  Abnormal gait, Decreased activity tolerance, Decreased balance, Decreased strength, Decreased endurance, Pain  Visit Diagnosis: Pain in left hip  Difficulty in walking, not elsewhere classified  Muscle weakness (generalized)     Problem List Patient Active Problem List   Diagnosis Date Noted  . S/P hip replacement, left 09/23/2019  . Osteoarthritis of left hip 09/20/2019  . Primary osteoarthritis of right hip 10/09/2017  . Osteoarthritis of right hip 10/04/2017  . Special screening for malignant neoplasms, colon  12/30/2016  . Breast cancer ([place]) 06/27/2011  . Hx Breast cancer, Right, multifocal IDC, receptor +, Her2 - 06/07/2006    Class: Stage 1  . Hx Breast cancer (DCIS), Right, Stage 0,  08/08/2001  Rayetta Humphrey, PT CLT [phone] 10/17/2019, 3:46 PM  [facility] [address] [place], [place], [address] Phone: [phone]   Fax:  [phone]  Name: Haley Peterson MRN: [id number] Date of Birth: Nov 19, 1945

## 2019-10-22 ENCOUNTER — Encounter (HOSPITAL_COMMUNITY): Payer: Self-pay | Admitting: Physical Therapy

## 2019-10-22 ENCOUNTER — Other Ambulatory Visit: Payer: Self-pay

## 2019-10-22 ENCOUNTER — Ambulatory Visit (HOSPITAL_COMMUNITY): Payer: PPO | Admitting: Physical Therapy

## 2019-10-22 DIAGNOSIS — M6281 Muscle weakness (generalized): Secondary | ICD-10-CM

## 2019-10-22 DIAGNOSIS — M25552 Pain in left hip: Secondary | ICD-10-CM

## 2019-10-22 DIAGNOSIS — R262 Difficulty in walking, not elsewhere classified: Secondary | ICD-10-CM

## 2019-10-22 DIAGNOSIS — M25551 Pain in right hip: Secondary | ICD-10-CM

## 2019-10-22 NOTE — Therapy (Signed)
[place], [place], [address] Phone: [phone]   Fax:  [phone]  Physical Therapy Treatment  Patient Details  Name: Haley Peterson MRN: [id number] Date of Birth: [birth year] Referring Provider (PT): Frederik Pear    Encounter Date: 10/22/2019  PT End of Session - 10/22/19 1513    Visit Number  2    Number of Visits  8    Date for PT Re-Evaluation  11/14/19    Authorization Type  [employer]    Authorization - Visit Number  2    Authorization - Number of Visits  8    PT Start Time  1500    PT Stop Time  5329    PT Time Calculation (min)  38 min    Activity Tolerance  Patient tolerated treatment well       Past Medical History:  Diagnosis Date  . Arthritis   . Bone spur    left heel  . Breast cancer ([place])    bilateral  . Cancer ([place])    Bilateral Breast cancer  . Cataract    right eye   . CKD (chronic kidney disease), stage III    mgd by PCP   . Dyspnea    recent last 2 months when walk to mail box- "winded" less active -no exercise 2 months   . History of breast cancer    left, right  . Hypertension   . Personal history of radiation therapy   . Pneumonia 10/2012    Past Surgical History:  Procedure Laterality Date  . ABDOMINAL HYSTERECTOMY    . BIOPSY  04/12/2017   Procedure: BIOPSY;  Surgeon: Rogene Houston, MD;  [facility];  Service: Endoscopy;;  ascending colon ulcer biopsies  . BREAST LUMPECTOMY  08/28/2001   left  . BREAST RECONSTRUCTION Right    implant  . CHOLECYSTECTOMY  2002  . COLONOSCOPY    . COLONOSCOPY N/A 04/12/2017   Procedure: COLONOSCOPY;  Surgeon: Rogene Houston, MD;  [facility];  Service: Endoscopy;  Laterality: N/A;  830  . MASTECTOMY  08/15/2006   right  . PARTIAL HYSTERECTOMY  1985  . REPLACEMENT TOTAL KNEE  2009, 2010   right-2010, left 2009  . TISSUE EXPANDER PLACEMENT    . TOTAL HIP ARTHROPLASTY Right 10/09/2017   Procedure:  RIGHT TOTAL HIP ARTHROPLASTY ANTERIOR APPROACH;  Surgeon: Frederik Pear, MD;  Location: [facility];  Service: Orthopedics;  Laterality: Right;  . TOTAL HIP ARTHROPLASTY Left 09/23/2019   Procedure: Left Anterior Hip Arthroplasty;  Surgeon: Frederik Pear, MD;  [facility];  Service: Orthopedics;  Laterality: Left;    There were no vitals filed for this visit.  Subjective Assessment - 10/22/19 1503    Subjective  [name] states that her pain is at a 5; she is doing her exercises.  She stayed in bed all  day but did not get much sleep.    Pertinent History  s/p RT THR, s/P, TKR, hx of breast cancer    Limitations  Standing;Walking;Lifting    How long can you sit comfortably?  able to sit as long as she wants    How long can you stand comfortably?  20 minutes    How long can you walk comfortably?  walking with a cane able to walk for less than five minutes due to not really pursuing walking longer.  To  be sleeping in her bed again,(currently in the  recliner)    Patient Stated Goals  TO be able to walk without an assistive device.    Currently in Pain?  Yes    Pain Score  5     Pain Location  Hip    Pain Orientation  Left    Pain Descriptors / Indicators  Aching    Pain Type  Acute pain    Pain Onset  1 to 4 weeks ago    Pain Frequency  Constant                       [facility] Adult PT Treatment/Exercise - 10/22/19 0001      Exercises   Exercises  Knee/Hip      Knee/Hip Exercises: Aerobic   Nustep  level 3 , hills x 7 minutes    not charged for.      Knee/Hip Exercises: Standing   Heel Raises  10 reps    Forward Lunges  10 reps;Both    Lateral Step Up  Step Height: 4";Hand Hold: 1    Forward Step Up  Step Height: 4";Hand Hold: 1    Functional Squat  15 reps    Rocker Board  2 minutes    SLS  3 x each LE     Other Standing Knee Exercises  side step x 2 Reps     Other Standing Knee Exercises  gt around department with greater cadence.       Knee/Hip Exercises: Seated    Sit to Sand  10 reps               PT Short Term Goals - 10/22/19 1524      PT SHORT TERM GOAL #1   Title  Pt to be able to stand without AD for 30 minutes without increased Lt hip pain in order to make a simple meal    Time  2    Period  Weeks    Status  On-going    Target Date  10/31/19      PT SHORT TERM GOAL #2   Title  PT to be walking in her home without a cane with confidence.    Time  2    Period  Weeks    Status  On-going      PT SHORT TERM GOAL #3   Title  PT to be walking to her mailbox and back using her cane without fatigue or increased pain.    Time  2    Period  Weeks    Status  Achieved        PT Long Term Goals - 10/22/19 1525      PT LONG TERM GOAL #1   Title  PT to be able to single leg stance on both LE for at least 30" to allow pt to be confident walking outside without an assistive device.    Time  4    Period  Weeks    Status  On-going      PT LONG TERM GOAL #2   Title  PT strength in core and LE to have increased 1/2 grade to allow pt to be confident walking up and down steps using a handrail in a reciprocal manner.    Time  4    Period  Weeks    Status  On-going      PT LONG TERM GOAL #3   Title  PT to be able to walk for 30 mintues without  having to sit down in order to be able to complete her shopping tasks.    Time  4    Period  Weeks    Status  On-going      PT LONG TERM GOAL #4   Title  PT to be sleeping in her bed once again    Time  4    Period  Weeks    Status  On-going            Plan - 10/22/19 1527    Clinical Impression Statement  Evaluat8ion and goals reviewed with pt.  Pt began wt bearing strengthening and balance activity with contact gaurd assist.  Therapist urged pt to ambulate with increased velocity.    Personal Factors and Comorbidities  Comorbidity 3+    Comorbidities  Rt THR, TKR, hx of breast cancer.    Examination-Activity Limitations  Bathing;Bed Mobility;Bend;Carry;Dressing;Lift;Locomotion  Level;Squat;Stairs;Stand    Examination-Participation Restrictions  Cleaning;Community Activity;Laundry;Driving;Shop;Yard Work    Stability/Clinical Decision Making  Stable/Uncomplicated    Rehab Potential  Good    PT Frequency  2x / week    PT Duration  4 weeks    PT Treatment/Interventions  Patient/family education;Gait training;Stair training;Functional mobility training;Therapeutic activities;Therapeutic exercise;Balance training;Manual techniques    PT Next Visit Plan  begin step downs and steps    PT Home Exercise Plan  heel raise, sit to stand, squat and bridges.       Patient will benefit from skilled therapeutic intervention in order to improve the following deficits and impairments:  Abnormal gait, Decreased activity tolerance, Decreased balance, Decreased strength, Decreased endurance, Pain  Visit Diagnosis: Pain in left hip  Difficulty in walking, not elsewhere classified  Muscle weakness (generalized)  Pain in right hip     Problem List Patient Active Problem List   Diagnosis Date Noted  . S/P hip replacement, left 09/23/2019  . Osteoarthritis of left hip 09/20/2019  . Primary osteoarthritis of right hip 10/09/2017  . Osteoarthritis of right hip 10/04/2017  . Special screening for malignant neoplasms, colon 12/30/2016  . Breast cancer ([place]) 06/27/2011  . Hx Breast cancer, Right, multifocal IDC, receptor +, Her2 - 06/07/2006    Class: Stage 1  . Hx Breast cancer (DCIS), Right, Stage 0,  08/08/2001    Rayetta Humphrey, PT CLT [phone] 10/22/2019, 3:38 PM  [facility] [address] [place], [place], [address] Phone: [phone]   Fax:  [phone]  Name: PRICILA BRIDGE MRN: [id number] Date of Birth: 1946/02/14

## 2019-10-24 ENCOUNTER — Other Ambulatory Visit: Payer: Self-pay

## 2019-10-24 ENCOUNTER — Ambulatory Visit (HOSPITAL_COMMUNITY): Payer: PPO | Admitting: Physical Therapy

## 2019-10-24 DIAGNOSIS — M25552 Pain in left hip: Secondary | ICD-10-CM | POA: Diagnosis not present

## 2019-10-24 DIAGNOSIS — M6281 Muscle weakness (generalized): Secondary | ICD-10-CM

## 2019-10-24 DIAGNOSIS — R262 Difficulty in walking, not elsewhere classified: Secondary | ICD-10-CM

## 2019-10-24 NOTE — Therapy (Signed)
[place], [place], [address] Phone: [phone]   Fax:  [phone]  Physical Therapy Treatment  Patient Details  Name: Haley Peterson MRN: [id number] Date of Birth: [birth year] Referring Provider (PT): Frederik Pear    Encounter Date: 10/24/2019  PT End of Session - 10/24/19 1207    Visit Number  3    Number of Visits  8    Date for PT Re-Evaluation  11/14/19    Authorization Type  [employer]    Authorization - Visit Number  3    Authorization - Number of Visits  8    PT Start Time  9450    PT Stop Time  1220    PT Time Calculation (min)  45 min    Activity Tolerance  Patient tolerated treatment well       Past Medical History:  Diagnosis Date  . Arthritis   . Bone spur    left heel  . Breast cancer ([place])    bilateral  . Cancer ([place])    Bilateral Breast cancer  . Cataract    right eye   . CKD (chronic kidney disease), stage III    mgd by PCP   . Dyspnea    recent last 2 months when walk to mail box- "winded" less active -no exercise 2 months   . History of breast cancer    left, right  . Hypertension   . Personal history of radiation therapy   . Pneumonia 10/2012    Past Surgical History:  Procedure Laterality Date  . ABDOMINAL HYSTERECTOMY    . BIOPSY  04/12/2017   Procedure: BIOPSY;  Surgeon: Rogene Houston, MD;  [facility];  Service: Endoscopy;;  ascending colon ulcer biopsies  . BREAST LUMPECTOMY  08/28/2001   left  . BREAST RECONSTRUCTION Right    implant  . CHOLECYSTECTOMY  2002  . COLONOSCOPY    . COLONOSCOPY N/A 04/12/2017   Procedure: COLONOSCOPY;  Surgeon: Rogene Houston, MD;  [facility];  Service: Endoscopy;  Laterality: N/A;  830  . MASTECTOMY  08/15/2006   right  . PARTIAL HYSTERECTOMY  1985  . REPLACEMENT TOTAL KNEE  2009, 2010   right-2010, left 2009  . TISSUE EXPANDER PLACEMENT    . TOTAL HIP ARTHROPLASTY Right 10/09/2017   Procedure: RIGHT TOTAL HIP ARTHROPLASTY ANTERIOR APPROACH;  Surgeon: Frederik Pear, MD;  Location: [facility];  Service: Orthopedics;  Laterality: Right;  . TOTAL HIP ARTHROPLASTY Left 09/23/2019   Procedure: Left Anterior Hip Arthroplasty;  Surgeon: Frederik Pear, MD;  [facility];  Service: Orthopedics;  Laterality: Left;    There were no vitals filed for this visit.  Subjective Assessment - 10/24/19 1141    Subjective  PT states that she slept all night in her bed last night.  Her knee is bothering her at thei time.  She is walking for ten minutes now.    Pertinent History  s/p RT THR, s/P, TKR, hx of breast cancer    Limitations  Standing;Walking;Lifting    How long can you sit comfortably?  able to sit as long as she wants    How long can you stand comfortably?  20 minutes    How long can you walk comfortably?  walking with a cane able to walk for less than five minutes due to not really pursuing walking longer.  To  be sleeping in her bed again,(currently in the recliner)  Patient Stated Goals  TO be able to walk without an assistive device.    Currently in Pain?  Yes    Pain Score  6     Pain Location  Knee    Pain Orientation  Left    Pain Descriptors / Indicators  Aching    Pain Onset  In the past 7 days    Pain Frequency  Intermittent    Aggravating Factors   activity    Pain Relieving Factors  rest    Effect of Pain on Daily Activities  limits                 [facility] Adult PT Treatment/Exercise - 10/24/19 0001      Ambulation/Gait   Stairs  Yes    Stairs Assistance  6: Modified independent (Device/Increase time)    Number of Stairs  8    Height of Stairs  7    Gait Comments  reciprocal       Exercises   Exercises  Knee/Hip      Knee/Hip Exercises: Aerobic   Nustep  level 3 , hills x 7 minutes    not charged for.      Knee/Hip Exercises: Standing   Heel Raises  15 reps    Forward Lunges  10 reps;Both    Lateral Step Up  15 reps;Hand Hold: 1;Step Height:  4"    Forward Step Up  15 reps;Hand Hold: 1;Step Height: 4"    Functional Squat  15 reps    Rocker Board  2 minutes    SLS  3 x each LE     Other Standing Knee Exercises  hip abduction/extensionx 10 B, green t bandside step x 2 Reps     Other Standing Knee Exercises  tandem stance on foam; gt around department with greater cadence.       Knee/Hip Exercises: Seated   Sit to Sand  10 reps               PT Short Term Goals - 10/22/19 1524      PT SHORT TERM GOAL #1   Title  Pt to be able to stand without AD for 30 minutes without increased Lt hip pain in order to make a simple meal    Time  2    Period  Weeks    Status  On-going    Target Date  10/31/19      PT SHORT TERM GOAL #2   Title  PT to be walking in her home without a cane with confidence.    Time  2    Period  Weeks    Status  On-going      PT SHORT TERM GOAL #3   Title  PT to be walking to her mailbox and back using her cane without fatigue or increased pain.    Time  2    Period  Weeks    Status  Achieved        PT Long Term Goals - 10/22/19 1525      PT LONG TERM GOAL #1   Title  PT to be able to single leg stance on both LE for at least 30" to allow pt to be confident walking outside without an assistive device.    Time  4    Period  Weeks    Status  On-going      PT LONG TERM GOAL #2   Title  PT strength in core  and LE to have increased 1/2 grade to allow pt to be confident walking up and down steps using a handrail in a reciprocal manner.    Time  4    Period  Weeks    Status  On-going      PT LONG TERM GOAL #3   Title  PT to be able to walk for 30 mintues without having to sit down in order to be able to complete her shopping tasks.    Time  4    Period  Weeks    Status  On-going      PT LONG TERM GOAL #4   Title  PT to be sleeping in her bed once again    Time  4    Period  Weeks    Status  On-going            Plan - 10/24/19 1207    Clinical Impression Statement  PT Lt  knee bothering her today not her hip.   States that her knee bothers her periodically.  Added standing hip ab/adduciton as well as reps withno difficulty.  PT took less breaks today.    Personal Factors and Comorbidities  Comorbidity 3+    Comorbidities  Rt THR, TKR, hx of breast cancer.    Examination-Activity Limitations  Bathing;Bed Mobility;Bend;Carry;Dressing;Lift;Locomotion Level;Squat;Stairs;Stand    Examination-Participation Restrictions  Cleaning;Community Activity;Laundry;Driving;Shop;Yard Work    Stability/Clinical Decision Making  Stable/Uncomplicated    Rehab Potential  Good    PT Frequency  2x / week    PT Duration  4 weeks    PT Treatment/Interventions  Patient/family education;Gait training;Stair training;Functional mobility training;Therapeutic activities;Therapeutic exercise;Balance training;Manual techniques    PT Next Visit Plan  begin step downs    PT Home Exercise Plan  heel raise, sit to stand, squat and bridges.       Patient will benefit from skilled therapeutic intervention in order to improve the following deficits and impairments:  Abnormal gait, Decreased activity tolerance, Decreased balance, Decreased strength, Decreased endurance, Pain  Visit Diagnosis: Pain in left hip  Difficulty in walking, not elsewhere classified  Muscle weakness (generalized)     Problem List Patient Active Problem List   Diagnosis Date Noted  . S/P hip replacement, left 09/23/2019  . Osteoarthritis of left hip 09/20/2019  . Primary osteoarthritis of right hip 10/09/2017  . Osteoarthritis of right hip 10/04/2017  . Special screening for malignant neoplasms, colon 12/30/2016  . Breast cancer ([place]) 06/27/2011  . Hx Breast cancer, Right, multifocal IDC, receptor +, Her2 - 06/07/2006    Class: Stage 1  . Hx Breast cancer (DCIS), Right, Stage 0,  08/08/2001    Rayetta Humphrey, PT CLT [phone] 10/24/2019, 12:21 PM  [facility] [address] [place], [place], [address] Phone: [phone]   Fax:  [phone]  Name: Haley Peterson MRN: [id number] Date of Birth: 04-Sep-1946

## 2019-10-29 ENCOUNTER — Ambulatory Visit (HOSPITAL_COMMUNITY): Payer: PPO | Admitting: Physical Therapy

## 2019-10-29 ENCOUNTER — Other Ambulatory Visit: Payer: Self-pay

## 2019-10-29 ENCOUNTER — Encounter (HOSPITAL_COMMUNITY): Payer: Self-pay | Admitting: Physical Therapy

## 2019-10-29 DIAGNOSIS — R262 Difficulty in walking, not elsewhere classified: Secondary | ICD-10-CM

## 2019-10-29 DIAGNOSIS — M25552 Pain in left hip: Secondary | ICD-10-CM | POA: Diagnosis not present

## 2019-10-29 DIAGNOSIS — M6281 Muscle weakness (generalized): Secondary | ICD-10-CM

## 2019-10-29 NOTE — Therapy (Signed)
[place], [place], [address] Phone: [phone]   Fax:  [phone]  Physical Therapy Treatment  Patient Details  Name: Haley Peterson MRN: [id number] Date of Birth: [birth year] Referring Provider (PT): Frederik Pear    Encounter Date: 10/29/2019  PT End of Session - 10/29/19 1324    Visit Number  4    Number of Visits  8    Date for PT Re-Evaluation  11/14/19    Authorization Type  [employer]    Authorization - Visit Number  4    Authorization - Number of Visits  8    PT Start Time  6384    PT Stop Time  1400    PT Time Calculation (min)  45 min    Activity Tolerance  Patient tolerated treatment well       Past Medical History:  Diagnosis Date  . Arthritis   . Bone spur    left heel  . Breast cancer ([place])    bilateral  . Cancer ([facility])    Bilateral Breast cancer  . Cataract    right eye   . CKD (chronic kidney disease), stage III    mgd by PCP   . Dyspnea    recent last 2 months when walk to mail box- "winded" less active -no exercise 2 months   . History of breast cancer    left, right  . Hypertension   . Personal history of radiation therapy   . Pneumonia 10/2012    Past Surgical History:  Procedure Laterality Date  . ABDOMINAL HYSTERECTOMY    . BIOPSY  04/12/2017   Procedure: BIOPSY;  Surgeon: Rogene Houston, MD;  [facility];  Service: Endoscopy;;  ascending colon ulcer biopsies  . BREAST LUMPECTOMY  08/28/2001   left  . BREAST RECONSTRUCTION Right    implant  . CHOLECYSTECTOMY  2002  . COLONOSCOPY    . COLONOSCOPY N/A 04/12/2017   Procedure: COLONOSCOPY;  Surgeon: Rogene Houston, MD;  [facility];  Service: Endoscopy;  Laterality: N/A;  830  . MASTECTOMY  08/15/2006   right  . PARTIAL HYSTERECTOMY  1985  . REPLACEMENT TOTAL KNEE  2009, 2010   right-2010, left 2009  . TISSUE EXPANDER PLACEMENT    . TOTAL HIP ARTHROPLASTY Right 10/09/2017   Procedure: RIGHT TOTAL HIP ARTHROPLASTY ANTERIOR APPROACH;  Surgeon: Frederik Pear, MD;  Location: [facility];  Service: Orthopedics;  Laterality: Right;  . TOTAL HIP ARTHROPLASTY Left 09/23/2019   Procedure: Left Anterior Hip Arthroplasty;  Surgeon: Frederik Pear, MD;  [facility];  Service: Orthopedics;  Laterality: Left;    There were no vitals filed for this visit.  Subjective Assessment - 10/29/19 1317    Subjective  PT states that she has been on her feet all morning shopping.  She is doing better on her steps but balance is still difficult.    Pertinent History  s/p RT THR, s/P, TKR, hx of breast cancer    Limitations  Standing;Walking;Lifting    How long can you sit comfortably?  able to sit as long as she wants    How long can you stand comfortably?  20 minutes    How long can you walk comfortably?  walking with a cane able to walk for less than five minutes due to not really pursuing walking longer.  To  be sleeping in her bed again,(currently in the recliner)    Patient  Stated Goals  TO be able to walk without an assistive device.    Currently in Pain?  Yes    Pain Score  3     Pain Location  Hip    Pain Orientation  Left    Pain Descriptors / Indicators  Aching    Pain Onset  In the past 7 days                       [facility] Adult PT Treatment/Exercise - 10/29/19 0001      Ambulation/Gait   Stairs  Yes    Stairs Assistance  6: Modified independent (Device/Increase time)    Number of Stairs  12    Height of Stairs  7    Gait Comments  reciprocal       Exercises   Exercises  Knee/Hip      Knee/Hip Exercises: Stretches   Gastroc Stretch Limitations  slant board 30" x 3       Knee/Hip Exercises: Aerobic   Nustep  level 3 , hills x 7 minutes    not charged for.      Knee/Hip Exercises: Standing   Heel Raises  15 reps    Forward Lunges  10 reps;Both    Lateral Step Up  Both;15 reps;Hand Hold: 2;Step Height: 6"    Forward Step Up  Both;15 reps;Hand Hold:  1;Step Height: 6"    Step Down  Left;10 reps;Step Height: 6"    Functional Squat  15 reps    Rocker Board  2 minutes    SLS  4 x each LE ; Lt 3" ; Rt 9"     Other Standing Knee Exercises  hip abduction/extensionx 15 B, green t bandside step x 2 Reps     Other Standing Knee Exercises  tandem stance on foam; gt around department with greater cadence.       Knee/Hip Exercises: Seated   Sit to Sand  10 reps               PT Short Term Goals - 10/29/19 1346      PT SHORT TERM GOAL #1   Title  Pt to be able to stand without AD for 30 minutes without increased Lt hip pain in order to make a simple meal    Time  2    Period  Weeks    Status  Achieved    Target Date  10/31/19      PT SHORT TERM GOAL #2   Title  PT to be walking in her home without a cane with confidence.    Time  2    Period  Weeks    Status  Achieved      PT SHORT TERM GOAL #3   Title  PT to be walking to her mailbox and back using her cane without fatigue or increased pain.    Time  2    Period  Weeks    Status  Achieved        PT Long Term Goals - 10/29/19 1347      PT LONG TERM GOAL #1   Title  PT to be able to single leg stance on both LE for at least 30" to allow pt to be confident walking outside without an assistive device.    Time  4    Period  Weeks    Status  On-going      PT LONG TERM GOAL #2  Title  PT strength in core and LE to have increased 1/2 grade to allow pt to be confident walking up and down steps using a handrail in a reciprocal manner.    Time  4    Period  Weeks    Status  Partially Met      PT LONG TERM GOAL #3   Title  PT to be able to walk for 30 mintues without having to sit down in order to be able to complete her shopping tasks.    Time  4    Period  Weeks    Status  Achieved      PT LONG TERM GOAL #4   Title  PT to be sleeping in her bed once again    Time  4    Period  Weeks    Status  Achieved            Plan - 10/29/19 1349    Clinical  Impression Statement  No breaks needed today and pt shopped this morning.  Reviewed goals with good progress.  Added step downs and gastroc stretch to program.    Personal Factors and Comorbidities  Comorbidity 3+    Comorbidities  Rt THR, TKR, hx of breast cancer.    Examination-Activity Limitations  Bathing;Bed Mobility;Bend;Carry;Dressing;Lift;Locomotion Level;Squat;Stairs;Stand    Examination-Participation Restrictions  Cleaning;Community Activity;Laundry;Driving;Shop;Yard Work    Stability/Clinical Decision Making  Stable/Uncomplicated    Rehab Potential  Good    PT Frequency  2x / week    PT Duration  4 weeks    PT Treatment/Interventions  Patient/family education;Gait training;Stair training;Functional mobility training;Therapeutic activities;Therapeutic exercise;Balance training;Manual techniques    PT Next Visit Plan  begin cybex hip extension machine.    PT Home Exercise Plan  heel raise, sit to stand, squat and bridges.       Patient will benefit from skilled therapeutic intervention in order to improve the following deficits and impairments:  Abnormal gait, Decreased activity tolerance, Decreased balance, Decreased strength, Decreased endurance, Pain  Visit Diagnosis: Pain in left hip  Difficulty in walking, not elsewhere classified  Muscle weakness (generalized)     Problem List Patient Active Problem List   Diagnosis Date Noted  . S/P hip replacement, left 09/23/2019  . Osteoarthritis of left hip 09/20/2019  . Primary osteoarthritis of right hip 10/09/2017  . Osteoarthritis of right hip 10/04/2017  . Special screening for malignant neoplasms, colon 12/30/2016  . Breast cancer ([place]) 06/27/2011  . Hx Breast cancer, Right, multifocal IDC, receptor +, Her2 - 06/07/2006    Class: Stage 1  . Hx Breast cancer (DCIS), Right, Stage 0,  08/08/2001    Rayetta Humphrey, PT CLT [phone] 10/29/2019, 1:57 PM  [facility] [address] [place], [place], [address] Phone: [phone]   Fax:  [phone]  Name: Haley Peterson MRN: [id number] Date of Birth: [birth year]

## 2019-10-31 ENCOUNTER — Encounter (HOSPITAL_COMMUNITY): Payer: Self-pay

## 2019-10-31 ENCOUNTER — Other Ambulatory Visit: Payer: Self-pay

## 2019-10-31 ENCOUNTER — Ambulatory Visit (HOSPITAL_COMMUNITY): Payer: PPO

## 2019-10-31 DIAGNOSIS — M25552 Pain in left hip: Secondary | ICD-10-CM

## 2019-10-31 DIAGNOSIS — R262 Difficulty in walking, not elsewhere classified: Secondary | ICD-10-CM

## 2019-10-31 DIAGNOSIS — M6281 Muscle weakness (generalized): Secondary | ICD-10-CM

## 2019-10-31 NOTE — Therapy (Signed)
[place], [place], [address] Phone: [phone]   Fax:  [phone]  Physical Therapy Treatment  Patient Details  Name: Haley Peterson MRN: [id number] Date of Birth: [birth year] Referring Provider (PT): Frederik Pear    Encounter Date: 10/31/2019  PT End of Session - 10/31/19 1321    Visit Number  5    Number of Visits  8    Date for PT Re-Evaluation  11/14/19    Authorization Type  [employer]    Authorization - Visit Number  5    Authorization - Number of Visits  8    PT Start Time  1962    PT Stop Time  1400    PT Time Calculation (min)  43 min    Equipment Utilized During Treatment  --   CGA   Activity Tolerance  Patient tolerated treatment well    Behavior During Therapy  [facility] for tasks assessed/performed       Past Medical History:  Diagnosis Date  . Arthritis   . Bone spur    left heel  . Breast cancer ([place])    bilateral  . Cancer ([place])    Bilateral Breast cancer  . Cataract    right eye   . CKD (chronic kidney disease), stage III    mgd by PCP   . Dyspnea    recent last 2 months when walk to mail box- "winded" less active -no exercise 2 months   . History of breast cancer    left, right  . Hypertension   . Personal history of radiation therapy   . Pneumonia 10/2012    Past Surgical History:  Procedure Laterality Date  . ABDOMINAL HYSTERECTOMY    . BIOPSY  04/12/2017   Procedure: BIOPSY;  Surgeon: Rogene Houston, MD;  [facility];  Service: Endoscopy;;  ascending colon ulcer biopsies  . BREAST LUMPECTOMY  08/28/2001   left  . BREAST RECONSTRUCTION Right    implant  . CHOLECYSTECTOMY  2002  . COLONOSCOPY    . COLONOSCOPY N/A 04/12/2017   Procedure: COLONOSCOPY;  Surgeon: Rogene Houston, MD;  [facility];  Service: Endoscopy;  Laterality: N/A;  830  . MASTECTOMY  08/15/2006   right  . PARTIAL HYSTERECTOMY  1985  . REPLACEMENT TOTAL KNEE  2009, 2010    right-2010, left 2009  . TISSUE EXPANDER PLACEMENT    . TOTAL HIP ARTHROPLASTY Right 10/09/2017   Procedure: RIGHT TOTAL HIP ARTHROPLASTY ANTERIOR APPROACH;  Surgeon: Frederik Pear, MD;  Location: [facility];  Service: Orthopedics;  Laterality: Right;  . TOTAL HIP ARTHROPLASTY Left 09/23/2019   Procedure: Left Anterior Hip Arthroplasty;  Surgeon: Frederik Pear, MD;  [facility];  Service: Orthopedics;  Laterality: Left;    There were no vitals filed for this visit.  Subjective Assessment - 10/31/19 1313    Subjective  Pt stated she is sore today hip and knee, believes may be related to weather.  Reports steps are improving, balance is still difficult.    Pertinent History  s/p RT THR, s/P, TKR, hx of breast cancer    Patient Stated Goals  TO be able to walk without an assistive device.    Currently in Pain?  Yes    Pain Score  3     Pain Location  Hip    Pain Orientation  Left    Pain Descriptors / Indicators  Aching;Sore    Pain  Type  Acute pain    Pain Onset  In the past 7 days    Pain Frequency  Intermittent    Aggravating Factors   activity    Pain Relieving Factors  rest    Effect of Pain on Daily Activities  limits                       [facility] Adult PT Treatment/Exercise - 10/31/19 0001      Ambulation/Gait   Stairs  Yes    Stairs Assistance  6: Modified independent (Device/Increase time)    Number of Stairs  12    Height of Stairs  7    Gait Comments  reciprocal       Exercises   Exercises  Knee/Hip      Knee/Hip Exercises: Stretches   Gastroc Stretch Limitations  slant board 30" x 3       Knee/Hip Exercises: Machines for Strengthening   Cybex Leg Press  2 set x 10 reps.  4Pl      Knee/Hip Exercises: Standing   Heel Raises  20 reps    Heel Raises Limitations  slope    Forward Lunges  10 reps;Both    Forward Lunges Limitations  no HHA    Functional Squat  15 reps    Rocker Board Limitations  Reciprocal pattern 4in step height x1; 7in height 3RT     SLS  Lt 6", Rt 7"    Other Standing Knee Exercises  sidestep GTB 2RT    Other Standing Knee Exercises  tandem stance on foam 3x 30"; tandem gait 2RT               PT Short Term Goals - 10/29/19 1346      PT SHORT TERM GOAL #1   Title  Pt to be able to stand without AD for 30 minutes without increased Lt hip pain in order to make a simple meal    Time  2    Period  Weeks    Status  Achieved    Target Date  10/31/19      PT SHORT TERM GOAL #2   Title  PT to be walking in her home without a cane with confidence.    Time  2    Period  Weeks    Status  Achieved      PT SHORT TERM GOAL #3   Title  PT to be walking to her mailbox and back using her cane without fatigue or increased pain.    Time  2    Period  Weeks    Status  Achieved        PT Long Term Goals - 10/29/19 1347      PT LONG TERM GOAL #1   Title  PT to be able to single leg stance on both LE for at least 30" to allow pt to be confident walking outside without an assistive device.    Time  4    Period  Weeks    Status  On-going      PT LONG TERM GOAL #2   Title  PT strength in core and LE to have increased 1/2 grade to allow pt to be confident walking up and down steps using a handrail in a reciprocal manner.    Time  4    Period  Weeks    Status  Partially Met      PT LONG TERM GOAL #  3   Title  PT to be able to walk for 30 mintues without having to sit down in order to be able to complete her shopping tasks.    Time  4    Period  Weeks    Status  Achieved      PT LONG TERM GOAL #4   Title  PT to be sleeping in her bed once again    Time  4    Period  Weeks    Status  Achieved            Plan - 10/31/19 1903    Clinical Impression Statement  Pt progressed well towards POC with no reast breaks required and good form with exercises.  Added leg press for hip extension strengthening.  No reoprts of pain through session.    Personal Factors and Comorbidities  Comorbidity 3+     Comorbidities  Rt THR, TKR, hx of breast cancer.    Examination-Activity Limitations  Bathing;Bed Mobility;Bend;Carry;Dressing;Lift;Locomotion Level;Squat;Stairs;Stand    Examination-Participation Restrictions  Cleaning;Community Activity;Laundry;Driving;Shop;Yard Work    Stability/Clinical Decision Making  Stable/Uncomplicated    Clinical Decision Making  Low    Rehab Potential  Good    PT Frequency  2x / week    PT Duration  4 weeks    PT Treatment/Interventions  Patient/family education;Gait training;Stair training;Functional mobility training;Therapeutic activities;Therapeutic exercise;Balance training;Manual techniques    PT Next Visit Plan  Progress balance.  Add vector stance next session.    PT Home Exercise Plan  heel raise, sit to stand, squat and bridges.       Patient will benefit from skilled therapeutic intervention in order to improve the following deficits and impairments:  Abnormal gait, Decreased activity tolerance, Decreased balance, Decreased strength, Decreased endurance, Pain  Visit Diagnosis: Difficulty in walking, not elsewhere classified  Muscle weakness (generalized)  Pain in left hip     Problem List Patient Active Problem List   Diagnosis Date Noted  . S/P hip replacement, left 09/23/2019  . Osteoarthritis of left hip 09/20/2019  . Primary osteoarthritis of right hip 10/09/2017  . Osteoarthritis of right hip 10/04/2017  . Special screening for malignant neoplasms, colon 12/30/2016  . Breast cancer ([place]) 06/27/2011  . Hx Breast cancer, Right, multifocal IDC, receptor +, Her2 - 06/07/2006    Class: Stage 1  . Hx Breast cancer (DCIS), Right, Stage 0,  08/08/2001   Ihor Austin, LPTA; CBIS [phone]  Aldona Lento 10/31/2019, 7:06 PM  [place], [place], [address] Phone: [phone]   Fax:  [phone]  Name: Haley Peterson MRN: [id number] Date of Birth:  08/17/1946

## 2019-11-01 ENCOUNTER — Telehealth (HOSPITAL_COMMUNITY): Payer: Self-pay | Admitting: Physical Therapy

## 2019-11-01 NOTE — Telephone Encounter (Signed)
pt came by the office to cancel the tuesday's appt due to she has another appt that day

## 2019-11-04 ENCOUNTER — Other Ambulatory Visit: Payer: Self-pay

## 2019-11-04 ENCOUNTER — Ambulatory Visit (HOSPITAL_COMMUNITY): Payer: PPO | Admitting: Physical Therapy

## 2019-11-04 DIAGNOSIS — R262 Difficulty in walking, not elsewhere classified: Secondary | ICD-10-CM

## 2019-11-04 DIAGNOSIS — M25552 Pain in left hip: Secondary | ICD-10-CM

## 2019-11-04 DIAGNOSIS — M6281 Muscle weakness (generalized): Secondary | ICD-10-CM

## 2019-11-04 DIAGNOSIS — M25551 Pain in right hip: Secondary | ICD-10-CM

## 2019-11-04 NOTE — Therapy (Addendum)
Haley Peterson [address] [place], [place], [address] Phone: [phone]   Fax:  [phone]  Physical Therapy Treatment  Patient Details  Name: Haley Peterson MRN: [id number] Date of Birth: [birth year] Referring Provider (PT): Haley Peterson  PHYSICAL THERAPY DISCHARGE SUMMARY  Visits from Start of Care: 6  Current functional level related to goals / functional outcomes: See below   Remaining deficits: Balance and gluteal maximus strength   Education / Equipment: HEP Plan: Patient agrees to discharge.  Patient goals were not met. Patient is being discharged due to meeting the stated rehab goals.  ?????       Encounter Date: 11/04/2019  PT End of Session - 11/04/19 1330    Visit Number  6    Number of Visits  6   Date for PT Re-Evaluation  11/14/19    Authorization Type  [employer]    Authorization - Visit Number  6    Authorization - Number of Visits  6   PT Start Time  8502    PT Stop Time  1400    PT Time Calculation (min)  45 min    Equipment Utilized During Treatment  --   CGA   Activity Tolerance  Patient tolerated treatment well    Behavior During Therapy  WFL for tasks assessed/performed       Past Medical History:  Diagnosis Date  . Arthritis   . Bone spur    left heel  . Breast cancer ([place])    bilateral  . Cancer ([place])    Bilateral Breast cancer  . Cataract    right eye   . CKD (chronic kidney disease), stage III    mgd by PCP   . Dyspnea    recent last 2 months when walk to mail box- "winded" less active -no exercise 2 months   . History of breast cancer    left, right  . Hypertension   . Personal history of radiation therapy   . Pneumonia 10/2012    Past Surgical History:  Procedure Laterality Date  . ABDOMINAL HYSTERECTOMY    . BIOPSY  04/12/2017   Procedure: BIOPSY;  Surgeon: Haley Houston, MD;  [facility];  Service: Endoscopy;;  ascending colon ulcer biopsies  . BREAST  LUMPECTOMY  08/28/2001   left  . BREAST RECONSTRUCTION Right    implant  . CHOLECYSTECTOMY  2002  . COLONOSCOPY    . COLONOSCOPY N/A 04/12/2017   Procedure: COLONOSCOPY;  Surgeon: Haley Houston, MD;  [facility];  Service: Endoscopy;  Laterality: N/A;  830  . MASTECTOMY  08/15/2006   right  . PARTIAL HYSTERECTOMY  1985  . REPLACEMENT TOTAL KNEE  2009, 2010   right-2010, left 2009  . TISSUE EXPANDER PLACEMENT    . TOTAL HIP ARTHROPLASTY Right 10/09/2017   Procedure: RIGHT TOTAL HIP ARTHROPLASTY ANTERIOR APPROACH;  Surgeon: Haley Pear, MD;  Location: [facility];  Service: Orthopedics;  Laterality: Right;  . TOTAL HIP ARTHROPLASTY Left 09/23/2019   Procedure: Left Anterior Hip Arthroplasty;  Surgeon: Haley Pear, MD;  [facility];  Service: Orthopedics;  Laterality: Left;    There were no vitals filed for this visit.  Subjective Assessment - 11/04/19 1312    Subjective  [name] states that she only has soreness in her hip.  She is not having trouble doing anything she can stand for 20 minutes or more.  She is doing her steps at  home without difficulty.    Pertinent History  s/p RT THR, s/P, TKR, hx of breast cancer    Limitations  Standing;Walking;Lifting    How long can you sit comfortably?  able to sit as long as she wants    How long can you stand comfortably?  20 minutes    How long can you walk comfortably?  walking with a cane able to walk for less than five minutes due to not really pursuing walking longer.  To  be sleeping in her bed again,(currently in the recliner)    Patient Stated Goals  TO be able to walk without an assistive device.    Pain Onset  In the past 7 days         [facility] PT Assessment - 11/04/19 0001      Assessment   Medical Diagnosis  Lt THR    Referring Provider (PT)  Haley Peterson     Onset Date/Surgical Date  09/23/19    Next MD Visit  11/05/2019    Prior Therapy  home health       Precautions   Precautions  Anterior Hip       Restrictions   Weight Bearing Restrictions  No      Home Environment   Living Environment  Private residence    Type of Home  House    Home Access  Stairs to enter    Entrance Stairs-Number of Steps  7    Home Layout  One level      Prior Function   Level of Independence  Independent with community mobility with device      Cognition   Overall Cognitive Status  Within Functional Limits for tasks assessed      Observation/Other Assessments   Focus on Therapeutic Outcomes (FOTO)   67 was 39      Functional Tests   Functional tests  Sit to Stand;Single leg stance      Single Leg Stance   Comments  Rt 10" was 5" , Lt 4" was 4"    more her knee than her hip.     Sit to Stand   Comments  13 was 8 in 30 seconds       Strength   Right Hip Flexion  5/5    Right Hip Extension  4/5   was 3+/5    Left Hip Flexion  4+/5    Left Hip Extension  4/5   was 3-/5    Right Knee Flexion  5/5    Right Knee Extension  5/5    Left Knee Flexion  4/5    Left Knee Extension  5/5   was 4+   Right Ankle Dorsiflexion  4/5    Left Ankle Dorsiflexion  4/5   was 3+      Ambulation/Gait   Ambulation Distance (Feet)  478 Feet   was 206   Assistive device  None   was using a cane    Stairs  Yes    Stairs Assistance  6: Modified independent (Device/Increase time)    Number of Stairs  12    Height of Stairs  7    Gait Comments  3 minute walk test                    [facility] Adult PT Treatment/Exercise - 11/04/19 0001      Exercises   Exercises  Knee/Hip      Knee/Hip Exercises: Standing   Heel   Raises  Both;15 reps    Heel Raises Limitations  toe raises x 15     Functional Squat  15 reps    SLS  Lt 4", Rt 10"    Other Standing Knee Exercises  tandem stance on foam 3x 30"; tandem gait 2RT      Knee/Hip Exercises: Prone   Hip Extension  Both;10 reps             PT Education - 11/04/19 1346    Education Details  hep [employer]    Person(s) Educated  Patient    Methods   Explanation;Handout    Comprehension  Verbalized understanding;Returned demonstration       PT Short Term Goals - 11/04/19 1347      PT SHORT TERM GOAL #1   Title  Pt to be able to stand without AD for 30 minutes without increased Lt hip pain in order to make a simple meal    Time  2    Period  Weeks    Status  Achieved    Target Date  10/31/19      PT SHORT TERM GOAL #2   Title  PT to be walking in her home without a cane with confidence.    Time  2    Period  Weeks    Status  Achieved      PT SHORT TERM GOAL #3   Title  PT to be walking to her mailbox and back using her cane without fatigue or increased pain.    Time  2    Period  Weeks    Status  Achieved        PT Long Term Goals - 11/04/19 1347      PT LONG TERM GOAL #1   Title  PT to be able to single leg stance on both LE for at least 30" to allow pt to be confident walking outside without an assistive device.    Time  4    Period  Weeks    Status  On-going      PT LONG TERM GOAL #2   Title  PT strength in core and LE to have increased 1/2 grade to allow pt to be confident walking up and down steps using a handrail in a reciprocal manner.    Time  4    Period  Weeks    Status  Achieved      PT LONG TERM GOAL #3   Title  PT to be able to walk for 30 mintues without having to sit down in order to be able to complete her shopping tasks.    Time  4    Period  Weeks    Status  Achieved      PT LONG TERM GOAL #4   Title  PT to be sleeping in her bed once again    Time  4    Period  Weeks    Status  Achieved            Plan - 11/04/19 1358    Clinical Impression Statement  PT reassessed for MD visit tomorrow.  PT has improved greatly in all aspects and is no longer in need of skllled therapy.  She understands that she still needs to work on hip extension strength and balance.    Personal Factors and Comorbidities  Comorbidity 3+    Comorbidities  Rt THR, TKR, hx of breast cancer.     Examination-Activity Limitations    Bathing;Bed Mobility;Bend;Carry;Dressing;Lift;Locomotion Level;Squat;Stairs;Stand    Examination-Participation Restrictions  Cleaning;Community Activity;Laundry;Driving;Shop;Yard Work    Stability/Clinical Decision Making  Stable/Uncomplicated    Rehab Potential  Good    PT Frequency  2x / week    PT Duration  4 weeks    PT Treatment/Interventions  Patient/family education;Gait training;Stair training;Functional mobility training;Therapeutic activities;Therapeutic exercise;Balance training;Manual techniques    PT Next Visit Plan  Discharge patient    PT Home Exercise Plan  heel raise, sit to stand, squat and bridges.       Patient will benefit from skilled therapeutic intervention in order to improve the following deficits and impairments:  Abnormal gait, Decreased activity tolerance, Decreased balance, Decreased strength, Decreased endurance, Pain  Visit Diagnosis: Difficulty in walking, not elsewhere classified  Muscle weakness (generalized)  Pain in left hip  Pain in right hip     Problem List Patient Active Problem List   Diagnosis Date Noted  . S/P hip replacement, left 09/23/2019  . Osteoarthritis of left hip 09/20/2019  . Primary osteoarthritis of right hip 10/09/2017  . Osteoarthritis of right hip 10/04/2017  . Special screening for malignant neoplasms, colon 12/30/2016  . Breast cancer ([place]) 06/27/2011  . Hx Breast cancer, Right, multifocal IDC, receptor +, Her2 - 06/07/2006    Class: Stage 1  . Hx Breast cancer (DCIS), Right, Stage 0,  08/08/2001  Rayetta Humphrey, PT CLT [phone] 11/04/2019, 2:03 PM  [facility] [address] [place], [place], [address] Phone: [phone]   Fax:  [phone]  Name: Haley Peterson MRN: [id number] Date of Birth: 1946-07-09

## 2019-11-05 ENCOUNTER — Encounter (HOSPITAL_COMMUNITY): Payer: PPO | Admitting: Physical Therapy

## 2019-11-05 DIAGNOSIS — Z9889 Other specified postprocedural states: Secondary | ICD-10-CM | POA: Diagnosis not present

## 2019-11-06 ENCOUNTER — Encounter (HOSPITAL_COMMUNITY): Payer: PPO

## 2019-11-12 ENCOUNTER — Encounter (HOSPITAL_COMMUNITY): Payer: PPO

## 2019-11-14 ENCOUNTER — Encounter (HOSPITAL_COMMUNITY): Payer: PPO | Admitting: Physical Therapy

## 2019-11-26 DIAGNOSIS — I129 Hypertensive chronic kidney disease with stage 1 through stage 4 chronic kidney disease, or unspecified chronic kidney disease: Secondary | ICD-10-CM | POA: Diagnosis not present

## 2019-11-26 DIAGNOSIS — R944 Abnormal results of kidney function studies: Secondary | ICD-10-CM | POA: Diagnosis not present

## 2019-11-26 DIAGNOSIS — E875 Hyperkalemia: Secondary | ICD-10-CM | POA: Diagnosis not present

## 2019-11-26 DIAGNOSIS — R7301 Impaired fasting glucose: Secondary | ICD-10-CM | POA: Diagnosis not present

## 2019-11-26 DIAGNOSIS — J060 Acute laryngopharyngitis: Secondary | ICD-10-CM | POA: Diagnosis not present

## 2019-11-26 DIAGNOSIS — I10 Essential (primary) hypertension: Secondary | ICD-10-CM | POA: Diagnosis not present

## 2019-11-26 DIAGNOSIS — N1831 Chronic kidney disease, stage 3a: Secondary | ICD-10-CM | POA: Diagnosis not present

## 2019-12-11 DIAGNOSIS — H40053 Ocular hypertension, bilateral: Secondary | ICD-10-CM | POA: Diagnosis not present

## 2019-12-19 DIAGNOSIS — R7301 Impaired fasting glucose: Secondary | ICD-10-CM | POA: Diagnosis not present

## 2019-12-19 DIAGNOSIS — R944 Abnormal results of kidney function studies: Secondary | ICD-10-CM | POA: Diagnosis not present

## 2019-12-19 DIAGNOSIS — I10 Essential (primary) hypertension: Secondary | ICD-10-CM | POA: Diagnosis not present

## 2019-12-23 DIAGNOSIS — N1831 Chronic kidney disease, stage 3a: Secondary | ICD-10-CM | POA: Diagnosis not present

## 2019-12-23 DIAGNOSIS — C50919 Malignant neoplasm of unspecified site of unspecified female breast: Secondary | ICD-10-CM | POA: Diagnosis not present

## 2019-12-23 DIAGNOSIS — Z6836 Body mass index (BMI) 36.0-36.9, adult: Secondary | ICD-10-CM | POA: Diagnosis not present

## 2019-12-23 DIAGNOSIS — I10 Essential (primary) hypertension: Secondary | ICD-10-CM | POA: Diagnosis not present

## 2019-12-23 DIAGNOSIS — M1710 Unilateral primary osteoarthritis, unspecified knee: Secondary | ICD-10-CM | POA: Diagnosis not present

## 2019-12-23 DIAGNOSIS — R7301 Impaired fasting glucose: Secondary | ICD-10-CM | POA: Diagnosis not present

## 2019-12-23 DIAGNOSIS — M8580 Other specified disorders of bone density and structure, unspecified site: Secondary | ICD-10-CM | POA: Diagnosis not present

## 2019-12-25 ENCOUNTER — Other Ambulatory Visit (HOSPITAL_COMMUNITY): Payer: Self-pay | Admitting: Internal Medicine

## 2019-12-25 DIAGNOSIS — M8580 Other specified disorders of bone density and structure, unspecified site: Secondary | ICD-10-CM

## 2020-01-08 ENCOUNTER — Other Ambulatory Visit: Payer: Self-pay

## 2020-01-08 ENCOUNTER — Ambulatory Visit
Admission: EM | Admit: 2020-01-08 | Discharge: 2020-01-08 | Disposition: A | Discharge status: Home / Self Care | Payer: PPO | Attending: Emergency Medicine | Admitting: Emergency Medicine

## 2020-01-08 DIAGNOSIS — Z20822 Contact with and (suspected) exposure to covid-19: Secondary | ICD-10-CM | POA: Diagnosis not present

## 2020-01-08 DIAGNOSIS — Z20828 Contact with and (suspected) exposure to other viral communicable diseases: Secondary | ICD-10-CM | POA: Diagnosis not present

## 2020-01-08 NOTE — ED Triage Notes (Signed)
Pt presents to UC w/ c/o cough, sinus drainage x4 days. Positive covid exp. Pt had first dose of covid vaccine on 12/26/19

## 2020-01-08 NOTE — Discharge Instructions (Addendum)
COVID testing ordered.  It will take between 2-5 days for test results.  Someone will contact you regarding abnormal results.    In the meantime: You should remain isolated in your home for 10 days from symptom onset AND greater than 72 hours after symptoms resolution (absence of fever without the use of fever-reducing medication and improvement in respiratory symptoms), whichever is longer Get plenty of rest and push fluids Use OTC zyrtec for nasal congestion, runny nose, and/or sore throat Use OTC flonase for nasal congestion and runny nose Use medications daily for symptom relief Use OTC medications like ibuprofen or tylenol as needed fever or pain Follow up with PCP (via video or telephone) later this week for recheck and to ensure symptoms are improving Call or go to the ED if you have any new or worsening symptoms such as fever, cough, shortness of breath, chest tightness, chest pain, turning blue, changes in mental status, etc..Marland Kitchen

## 2020-01-08 NOTE — ED Provider Notes (Signed)
[facility]   JP:[id number] 01/08/20 Arrival Time: 1027   CC: COVID symptoms  SUBJECTIVE: History from: patient  Haley Peterson is a 74 y.o. female who presents with non-productive cough, sinus drainage, and few episodes loose stools/ diarrhea x 4 days.  Admits to COVID exposure 5 days ago.  Had COVID vaccine on 12/26/19.  Has tried advil without relief.  Denies aggravating factors.  Reports previous symptoms in the past with allergies.   Denies fever, chills, fatigue, sore throat, SOB, wheezing, chest pain, nausea, vomiting, changes in bladder habits.    ROS: As per HPI.  All other pertinent ROS negative.     Past Medical History:  Diagnosis Date  . Arthritis   . Bone spur    left heel  . Breast cancer ([place])    bilateral  . Cancer ([place])    Bilateral Breast cancer  . Cataract    right eye   . CKD (chronic kidney disease), stage III    mgd by PCP   . Dyspnea    recent last 2 months when walk to mail box- "winded" less active -no exercise 2 months   . History of breast cancer    left, right  . Hypertension   . Personal history of radiation therapy   . Pneumonia 10/2012   Past Surgical History:  Procedure Laterality Date  . ABDOMINAL HYSTERECTOMY    . BIOPSY  04/12/2017   Procedure: BIOPSY;  Surgeon: Rogene Houston, MD;  [facility];  Service: Endoscopy;;  ascending colon ulcer biopsies  . BREAST LUMPECTOMY  08/28/2001   left  . BREAST RECONSTRUCTION Right    implant  . CHOLECYSTECTOMY  2002  . COLONOSCOPY    . COLONOSCOPY N/A 04/12/2017   Procedure: COLONOSCOPY;  Surgeon: Rogene Houston, MD;  [facility];  Service: Endoscopy;  Laterality: N/A;  830  . MASTECTOMY  08/15/2006   right  . PARTIAL HYSTERECTOMY  1985  . REPLACEMENT TOTAL KNEE  2009, 2010   right-2010, left 2009  . TISSUE EXPANDER PLACEMENT    . TOTAL HIP ARTHROPLASTY Right 10/09/2017   Procedure: RIGHT TOTAL HIP ARTHROPLASTY ANTERIOR APPROACH;  Surgeon: Frederik Pear, MD;   Location: [facility];  Service: Orthopedics;  Laterality: Right;  . TOTAL HIP ARTHROPLASTY Left 09/23/2019   Procedure: Left Anterior Hip Arthroplasty;  Surgeon: Frederik Pear, MD;  [facility];  Service: Orthopedics;  Laterality: Left;   Allergies  Allergen Reactions  . Penicillins Other (See Comments)    "LOSS OF VISION"  Has patient had a PCN reaction causing immediate rash, facial/tongue/throat swelling, SOB or lightheadedness with hypotension: No Has patient had a PCN reaction causing severe rash involving mucus membranes or skin necrosis: No Has patient had a PCN reaction that required hospitalization: No Has patient had a PCN reaction occurring within the last 10 years: No If all of the above answers are "NO", then may proceed with Cephalosporin use.   . Oxytetracycline Other (See Comments)    Fever blisters  . Tamoxifen Other (See Comments)    "GENERAL ILLNESS WITH LIGHTHEADNESS"   . Aromasin [Exemestane] Other (See Comments)    UNSPECIFIED REACTION  Pt is unsure of what reaction was...  . Codeine Other (See Comments)    LIGHTHEADED   No current facility-administered medications on file prior to encounter.   Current Outpatient Medications on File Prior to Encounter  Medication Sig Dispense Refill  . Ascorbic Acid (VITAMIN C) 1000 MG tablet Take  1,000 mg by mouth daily with lunch.    Marland Kitchen aspirin EC 81 MG tablet Take 1 tablet (81 mg total) by mouth 2 (two) times daily. 60 tablet 0  . Calcium-Vitamin D (CALTRATE 600 PLUS-VIT D PO) Take 1 tablet by mouth daily with lunch.     . cholecalciferol (VITAMIN D) 1000 UNITS tablet Take 1,000 Units by mouth daily with lunch.     . Garlic (GARLIQUE PO) Take 1 tablet by mouth daily with lunch.    . hydrochlorothiazide (HYDRODIURIL) 12.5 MG tablet Take 12.5 mg by mouth daily.    Marland Kitchen losartan (COZAAR) 100 MG tablet Take 100 mg by mouth daily.    Marland Kitchen oxyCODONE-acetaminophen (PERCOCET/ROXICET) 5-325 MG tablet Take 1 tablet by mouth every 4 (four)  hours as needed for severe pain. 30 tablet 0  . tiZANidine (ZANAFLEX) 2 MG tablet Take 1 tablet (2 mg total) by mouth every 6 (six) hours as needed. 60 tablet 0  . TURMERIC PO Take 3 capsules by mouth daily with lunch.     Social History   Socioeconomic History  . Marital status: Divorced    Spouse name: Not on file  . Number of children: Not on file  . Years of education: Not on file  . Highest education level: Not on file  Occupational History  . Not on file  Tobacco Use  . Smoking status: Former Smoker    Packs/day: 0.50    Years: 5.00    Pack years: 2.50    Types: Cigarettes    Quit date: 09/28/1983    Years since quitting: 36.3  . Smokeless tobacco: Never Used  Substance and Sexual Activity  . Alcohol use: No  . Drug use: No  . Sexual activity: Not on file  Other Topics Concern  . Not on file  Social History Narrative  . Not on file   Social Determinants of Health   Financial Resource Strain:   . Difficulty of Paying Living Expenses: Not on file  Food Insecurity:   . Worried About Charity fundraiser in the Last Year: Not on file  . Ran Out of Food in the Last Year: Not on file  Transportation Needs:   . Lack of Transportation (Medical): Not on file  . Lack of Transportation (Non-Medical): Not on file  Physical Activity:   . Days of Exercise per Week: Not on file  . Minutes of Exercise per Session: Not on file  Stress:   . Feeling of Stress : Not on file  Social Connections:   . Frequency of Communication with Friends and Family: Not on file  . Frequency of Social Gatherings with Friends and Family: Not on file  . Attends Religious Services: Not on file  . Active Member of Clubs or Organizations: Not on file  . Attends Archivist Meetings: Not on file  . Marital Status: Not on file  Intimate Partner Violence:   . Fear of Current or Ex-Partner: Not on file  . Emotionally Abused: Not on file  . Physically Abused: Not on file  . Sexually Abused:  Not on file   Family History  Problem Relation Age of Onset  . Arthritis Mother   . Thyroid cancer Brother        2010  . Colon cancer Neg Hx     OBJECTIVE:  Vitals:   01/08/20 1045 01/08/20 1048  BP:  130/67  Pulse:  83  Resp: 16 16  Temp:  98.1 F (36.7 C)  TempSrc: Oral Oral  SpO2:  95%     General appearance: alert; well-appearing, nontoxic; speaking in full sentences and tolerating own secretions HEENT: NCAT; Ears: EACs clear, TMs pearly gray; Eyes: PERRL.  EOM grossly intact.  Nose: nares patent without rhinorrhea, Throat: oropharynx clear, tonsils non erythematous or enlarged, uvula midline  Neck: supple without LAD Lungs: unlabored respirations, symmetrical air entry; cough: absent; no respiratory distress; CTAB Heart: regular rate and rhythm.   Skin: warm and dry Psychological: alert and cooperative; normal mood and affect  ASSESSMENT & PLAN:  1. Suspected COVID-19 virus infection   2. Exposure to COVID-19 virus    COVID testing ordered.  It will take between 2-5 days for test results.  Someone will contact you regarding abnormal results.    In the meantime: You should remain isolated in your home for 10 days from symptom onset AND greater than 72 hours after symptoms resolution (absence of fever without the use of fever-reducing medication and improvement in respiratory symptoms), whichever is longer Get plenty of rest and push fluids Use OTC zyrtec for nasal congestion, runny nose, and/or sore throat Use OTC flonase for nasal congestion and runny nose Use medications daily for symptom relief Use OTC medications like ibuprofen or tylenol as needed fever or pain Follow up with PCP (via video or telephone) later this week for recheck and to ensure symptoms are improving Call or go to the ED if you have any new or worsening symptoms such as fever, cough, shortness of breath, chest tightness, chest pain, turning blue, changes in mental status, etc...   Reviewed  expectations re: course of current medical issues. Questions answered. Outlined signs and symptoms indicating need for more acute intervention. Patient verbalized understanding. After Visit Summary given.         Lestine Box, PA-C 01/08/20 1115

## 2020-01-09 LAB — NOVEL CORONAVIRUS, NAA: SARS-CoV-2, NAA: DETECTED — AB

## 2020-01-10 ENCOUNTER — Other Ambulatory Visit: Payer: Self-pay | Admitting: Physician Assistant

## 2020-01-10 ENCOUNTER — Ambulatory Visit (HOSPITAL_COMMUNITY)
Admission: RE | Admit: 2020-01-10 | Discharge: 2020-01-10 | Disposition: A | Discharge status: Home / Self Care | Payer: Medicare Other | Source: Ambulatory Visit | Attending: Pulmonary Disease | Admitting: Pulmonary Disease

## 2020-01-10 ENCOUNTER — Telehealth: Payer: Self-pay | Admitting: Physician Assistant

## 2020-01-10 ENCOUNTER — Encounter: Payer: Self-pay | Admitting: Physician Assistant

## 2020-01-10 DIAGNOSIS — I10 Essential (primary) hypertension: Secondary | ICD-10-CM | POA: Diagnosis present

## 2020-01-10 DIAGNOSIS — U071 COVID-19: Secondary | ICD-10-CM | POA: Diagnosis present

## 2020-01-10 DIAGNOSIS — N189 Chronic kidney disease, unspecified: Secondary | ICD-10-CM | POA: Diagnosis present

## 2020-01-10 DIAGNOSIS — Z23 Encounter for immunization: Secondary | ICD-10-CM | POA: Insufficient documentation

## 2020-01-10 MED ORDER — METHYLPREDNISOLONE SODIUM SUCC 125 MG IJ SOLR: 125.0000 mg | Freq: Once | INTRAMUSCULAR | Status: DC | PRN

## 2020-01-10 MED ORDER — SODIUM CHLORIDE 0.9 % IV SOLN
700.0000 mg | Freq: Once | INTRAVENOUS | Status: AC
  Administered 2020-01-10: 700 mg via INTRAVENOUS
  Filled 2020-01-10: qty 20

## 2020-01-10 MED ORDER — ALBUTEROL SULFATE HFA 108 (90 BASE) MCG/ACT IN AERS: 2.0000 | INHALATION_SPRAY | Freq: Once | RESPIRATORY_TRACT | Status: DC | PRN

## 2020-01-10 MED ORDER — DIPHENHYDRAMINE HCL 50 MG/ML IJ SOLN: 50.0000 mg | Freq: Once | INTRAMUSCULAR | Status: DC | PRN

## 2020-01-10 MED ORDER — EPINEPHRINE 0.3 MG/0.3ML IJ SOAJ: 0.3000 mg | Freq: Once | INTRAMUSCULAR | Status: DC | PRN

## 2020-01-10 MED ORDER — FAMOTIDINE IN NACL 20-0.9 MG/50ML-% IV SOLN: 20.0000 mg | Freq: Once | INTRAVENOUS | Status: DC | PRN

## 2020-01-10 MED ORDER — SODIUM CHLORIDE 0.9 % IV SOLN
INTRAVENOUS | Status: DC | PRN
  Administered 2020-01-10: 250 mL via INTRAVENOUS

## 2020-01-10 NOTE — Progress Notes (Signed)
  Diagnosis: COVID-19  Physician: dr. Joya Gaskins  Procedure: [facility] Infusion Clinic Med: bamlanivimab infusion - Provided patient with bamlanimivab fact sheet for patients, parents and caregivers prior to infusion.  Complications: No immediate complications noted.  Discharge: Discharged home   Acquanetta Chain 01/10/2020

## 2020-01-10 NOTE — Telephone Encounter (Signed)
Called to discuss with Beata Linard Millers about Covid symptoms and the use of bamlanivimab or casirivimab/imdevimab, a monoclonal antibody infusion for those with mild to moderate Covid symptoms and at a high risk of hospitalization.     Pt is qualified for this infusion at the [facility] infusion center due to co-morbid conditions and/or a member of an at-risk group (age, CKD, HTN), however wants to think about it at this time. Symptoms tier reviewed as well as criteria for ending isolation.  Symptoms reviewed that would warrant ED/Hospital evaluation. Preventative practices reviewed. Patient verbalized understanding. Patient advised to go to Urgent care or ED with severe symptoms. Last date pt would be eligible for infusion is 01/14/20. Pt will try to call PCP and discuss. I will call her back later this PM and see if she would like to be signed up.      Patient Active Problem List   Diagnosis Date Noted  . S/P hip replacement, left 09/23/2019  . Osteoarthritis of left hip 09/20/2019  . Primary osteoarthritis of right hip 10/09/2017  . Osteoarthritis of right hip 10/04/2017  . Special screening for malignant neoplasms, colon 12/30/2016  . Breast cancer ([place]) 06/27/2011  . Hx Breast cancer, Right, multifocal IDC, receptor +, Her2 - 06/07/2006  . Hx Breast cancer ([facility]), Right, Stage 0,  08/08/2001    Angelena Form PA-C

## 2020-01-10 NOTE — Progress Notes (Signed)
  I connected by phone with Haley Peterson on 01/10/2020 at 12:00 PM to discuss the potential use of an new treatment for mild to moderate COVID-19 viral infection in non-hospitalized patients.  This patient is a 74 y.o. female that meets the FDA criteria for Emergency Use Authorization of bamlanivimab or casirivimab\imdevimab.  Has a (+) direct SARS-CoV-2 viral test result  Has mild or moderate COVID-19   Is ? 74 years of age and weighs ? 40 kg  Is NOT hospitalized due to COVID-19  Is NOT requiring oxygen therapy or requiring an increase in baseline oxygen flow rate due to COVID-19  Is within 10 days of symptom onset  Has at least one of the high risk factor(s) for progression to severe COVID-19 and/or hospitalization as defined in EUA.  Specific high risk criteria : >/= 74 yo, HTN, CKD   I have spoken and communicated the following to the patient or parent/caregiver:  1. FDA has authorized the emergency use of bamlanivimab and casirivimab\imdevimab for the treatment of mild to moderate COVID-19 in adults and pediatric patients with positive results of direct SARS-CoV-2 viral testing who are 75 years of age and older weighing at least 40 kg, and who are at high risk for progressing to severe COVID-19 and/or hospitalization.  2. The significant known and potential risks and benefits of bamlanivimab and casirivimab\imdevimab, and the extent to which such potential risks and benefits are unknown.  3. Information on available alternative treatments and the risks and benefits of those alternatives, including clinical trials.  4. Patients treated with bamlanivimab and casirivimab\imdevimab should continue to self-isolate and use infection control measures (e.g., wear mask, isolate, social distance, avoid sharing personal items, clean and disinfect "high touch" surfaces, and frequent handwashing) according to CDC guidelines.   5. The patient or parent/caregiver has the option to accept or refuse  bamlanivimab or casirivimab\imdevimab .  After reviewing this information with the patient, The patient agreed to proceed with receiving the bamlanimivab infusion and will be provided a copy of the Fact sheet prior to receiving the infusion.   Sx onset 2/20. Set up for infusion today at 12:30pm. Directions given. She is aware she will need to push off her second vaccine 90 days.   Angelena Form PA-C 01/10/2020 12:00 PM

## 2020-01-10 NOTE — Discharge Instructions (Signed)

## 2020-01-13 DIAGNOSIS — U071 COVID-19: Secondary | ICD-10-CM | POA: Diagnosis not present

## 2020-02-06 DIAGNOSIS — H524 Presbyopia: Secondary | ICD-10-CM | POA: Diagnosis not present

## 2020-02-06 DIAGNOSIS — H52223 Regular astigmatism, bilateral: Secondary | ICD-10-CM | POA: Diagnosis not present

## 2020-02-06 DIAGNOSIS — H5203 Hypermetropia, bilateral: Secondary | ICD-10-CM | POA: Diagnosis not present

## 2020-02-06 DIAGNOSIS — H40052 Ocular hypertension, left eye: Secondary | ICD-10-CM | POA: Diagnosis not present

## 2020-02-11 DIAGNOSIS — I129 Hypertensive chronic kidney disease with stage 1 through stage 4 chronic kidney disease, or unspecified chronic kidney disease: Secondary | ICD-10-CM | POA: Diagnosis not present

## 2020-02-11 DIAGNOSIS — N183 Chronic kidney disease, stage 3 unspecified: Secondary | ICD-10-CM | POA: Diagnosis not present

## 2020-02-11 DIAGNOSIS — M1710 Unilateral primary osteoarthritis, unspecified knee: Secondary | ICD-10-CM | POA: Diagnosis not present

## 2020-02-11 DIAGNOSIS — E669 Obesity, unspecified: Secondary | ICD-10-CM | POA: Diagnosis not present

## 2020-02-27 DIAGNOSIS — Z83511 Family history of glaucoma: Secondary | ICD-10-CM | POA: Diagnosis not present

## 2020-02-27 DIAGNOSIS — H40013 Open angle with borderline findings, low risk, bilateral: Secondary | ICD-10-CM | POA: Diagnosis not present

## 2020-02-27 DIAGNOSIS — H40053 Ocular hypertension, bilateral: Secondary | ICD-10-CM | POA: Diagnosis not present

## 2020-03-15 IMAGING — CR CHEST - 2 VIEW
2 series · 2 of 2 positions shown · non-contrast
Comparison: Radiographs 09/27/2017, 10/25/2011

CLINICAL DATA: Preoperative radiograph for hip replacement
scheduled 05/27/2019. Hypertension, former smoker.

EXAM:
CHEST - 2 VIEW

[w chest pa]
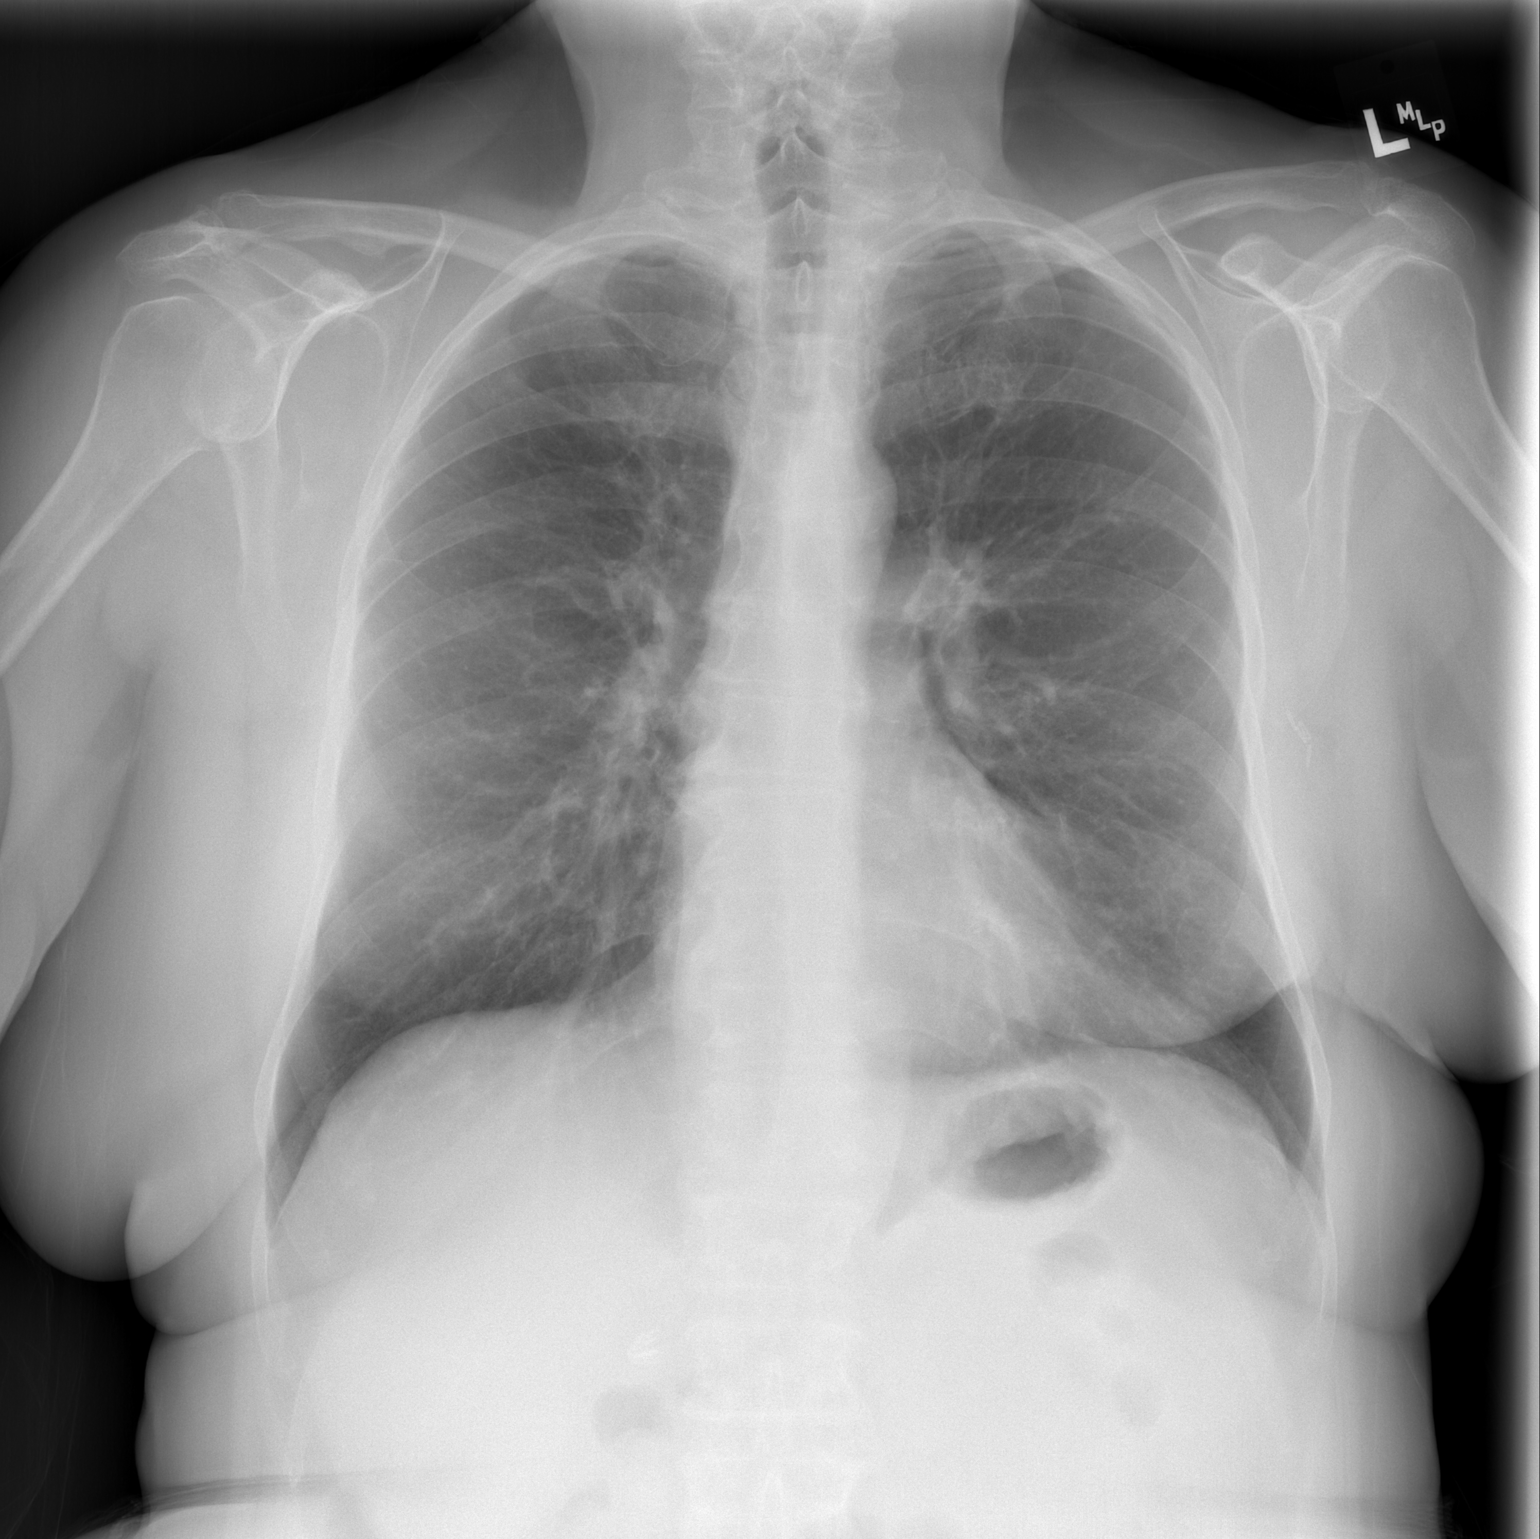

[w chest lat]
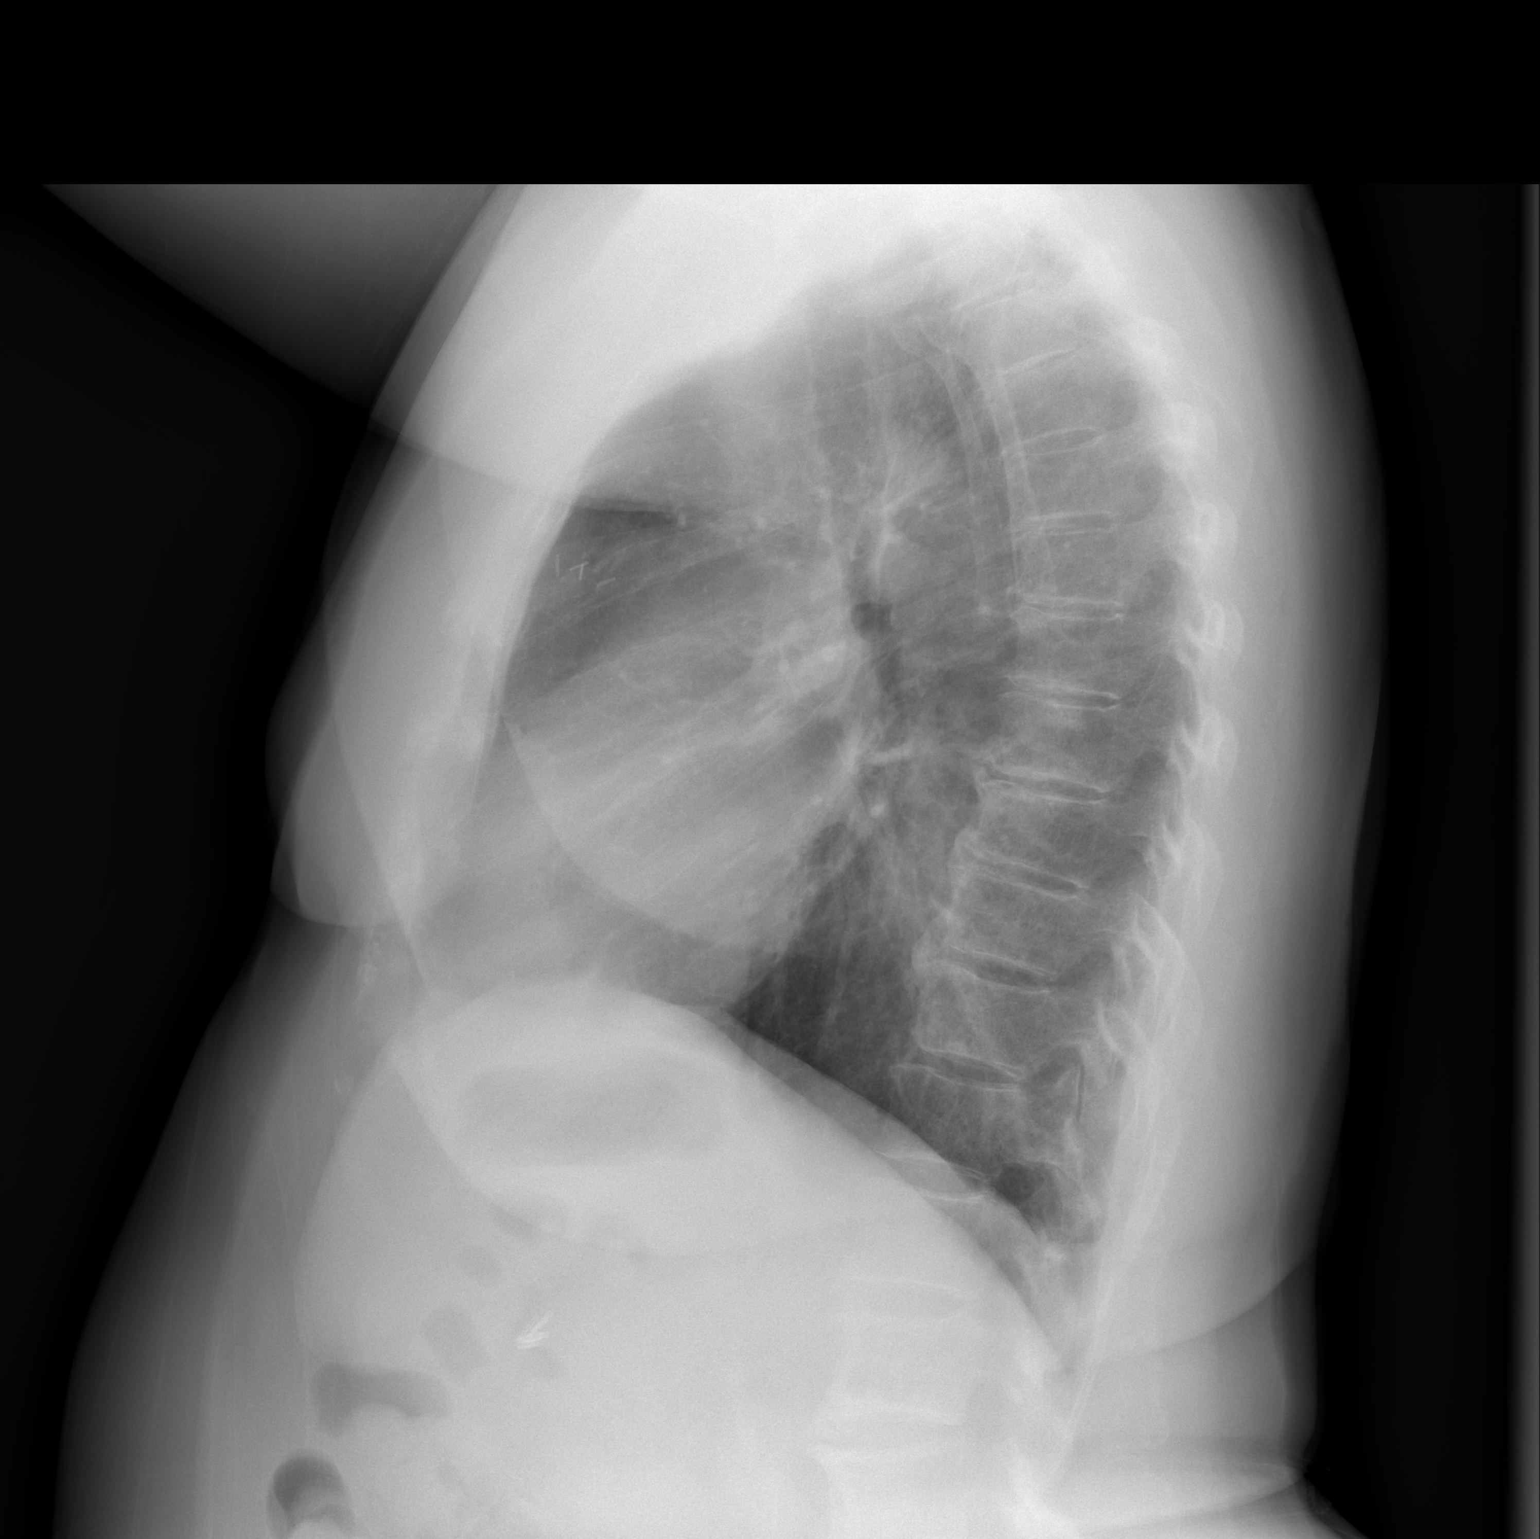

[2 of 2 positions shown; findings below may reference images not displayed]

FINDINGS: The heart size and mediastinal contours are within normal limits.
Both lungs are clear. Prior left mastectomy with clips in the left
axilla. Cholecystectomy clips in the right upper quadrant. Mild
degenerative changes in the spine and shoulders. No acute osseous or
soft tissue abnormality.
IMPRESSION: No active cardiopulmonary disease.

## 2020-03-24 DIAGNOSIS — Z96641 Presence of right artificial hip joint: Secondary | ICD-10-CM | POA: Diagnosis not present

## 2020-03-24 DIAGNOSIS — Z96642 Presence of left artificial hip joint: Secondary | ICD-10-CM | POA: Diagnosis not present

## 2020-03-24 DIAGNOSIS — Z471 Aftercare following joint replacement surgery: Secondary | ICD-10-CM | POA: Diagnosis not present

## 2020-04-07 ENCOUNTER — Ambulatory Visit: Payer: PPO | Attending: Internal Medicine

## 2020-04-07 DIAGNOSIS — Z23 Encounter for immunization: Secondary | ICD-10-CM

## 2020-04-07 NOTE — Progress Notes (Signed)
   Covid-19 Vaccination Clinic  Name:  Haley Peterson    MRN: [id number] DOB: Nov 17, 1945  04/07/2020  [name] was observed post Covid-19 immunization for 15 minutes without incident. She was provided with Vaccine Information Sheet and instruction to access the V-Safe system.   [name] was instructed to call 911 with any severe reactions post vaccine: Marland Kitchen Difficulty breathing  . Swelling of face and throat  . A fast heartbeat  . A bad rash all over body  . Dizziness and weakness   Immunizations Administered    Name Date Dose VIS Date Route   Moderna COVID-19 Vaccine 04/07/2020 11:59 AM 0.5 mL 10/2019 Intramuscular   Manufacturer: Moderna   Lot: [id number]   [id number]

## 2020-06-08 DIAGNOSIS — E669 Obesity, unspecified: Secondary | ICD-10-CM | POA: Diagnosis not present

## 2020-06-08 DIAGNOSIS — M1710 Unilateral primary osteoarthritis, unspecified knee: Secondary | ICD-10-CM | POA: Diagnosis not present

## 2020-06-08 DIAGNOSIS — I129 Hypertensive chronic kidney disease with stage 1 through stage 4 chronic kidney disease, or unspecified chronic kidney disease: Secondary | ICD-10-CM | POA: Diagnosis not present

## 2020-06-08 DIAGNOSIS — N183 Chronic kidney disease, stage 3 unspecified: Secondary | ICD-10-CM | POA: Diagnosis not present

## 2020-06-20 IMAGING — MG DIGITAL SCREENING UNILAT LEFT W/ TOMO W/ CAD
6 series · 6 of 18 positions shown · non-contrast
Comparison: Previous exam(s).

CLINICAL DATA: Screening.

EXAM:
DIGITAL SCREENING UNILATERAL LEFT MAMMOGRAM WITH CAD AND TOMO

[L MLO synth-2D (1 of 2)]
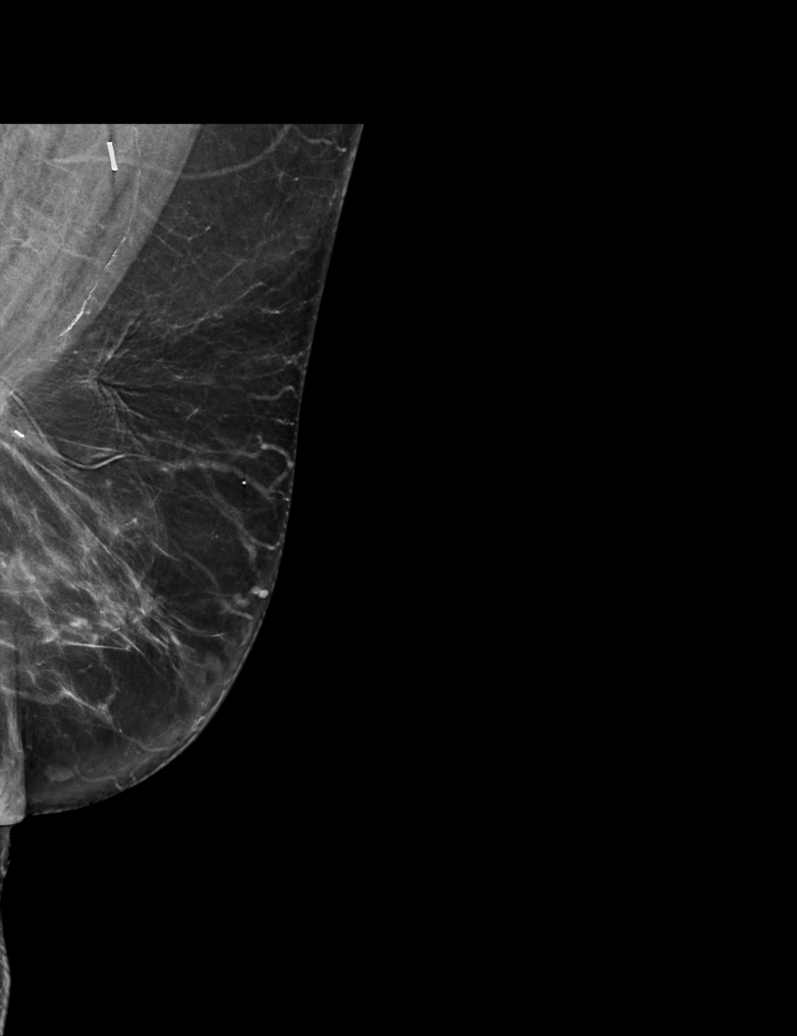

[L CC synth-2D]
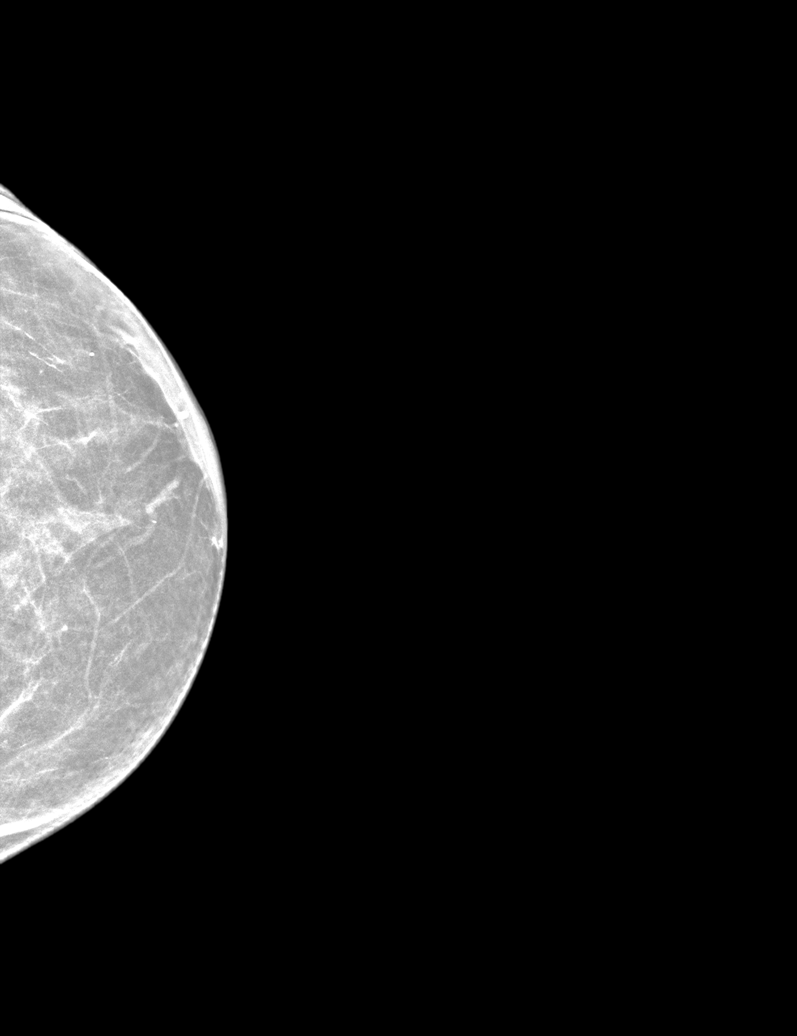

[L MLO synth-2D (2 of 2)]
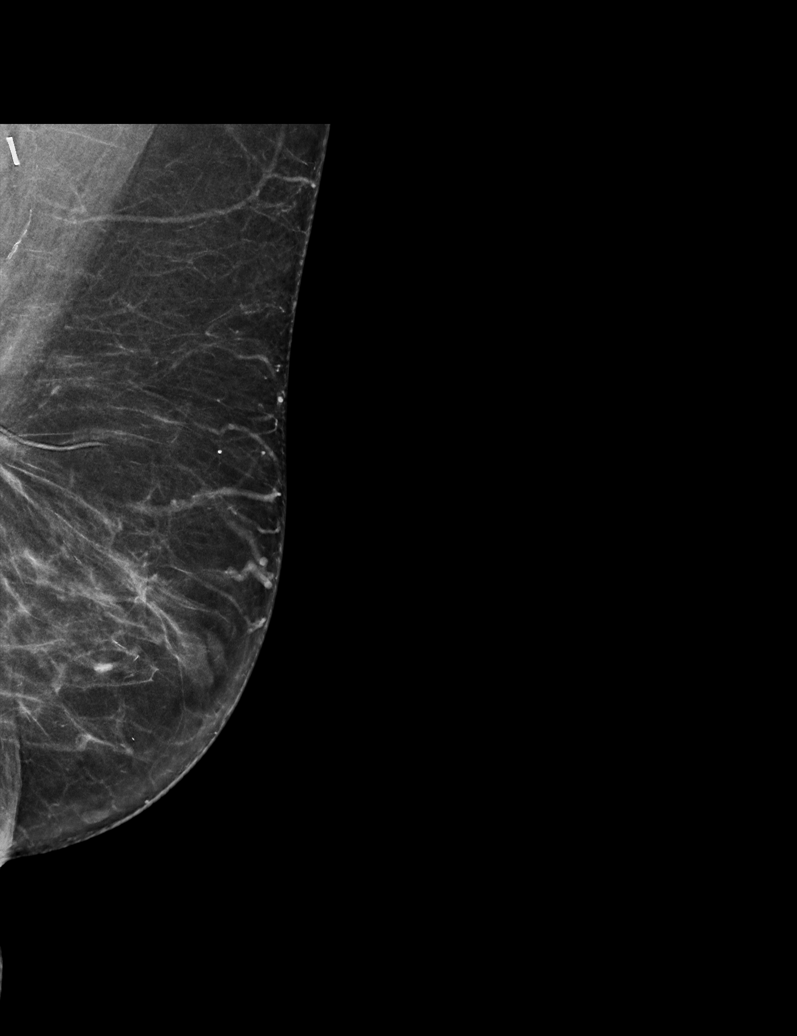

[L CC tomo · tomo slice 25/49.0]
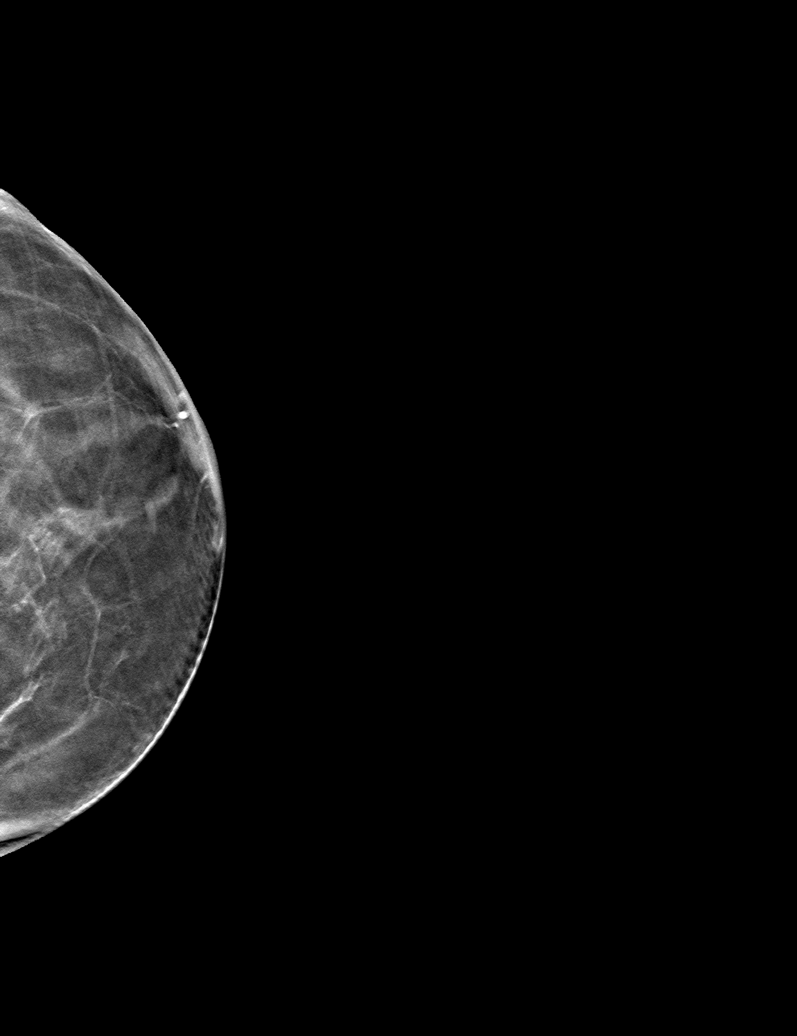

[L MLO tomo (1 of 2) · tomo slice 33/65.0]
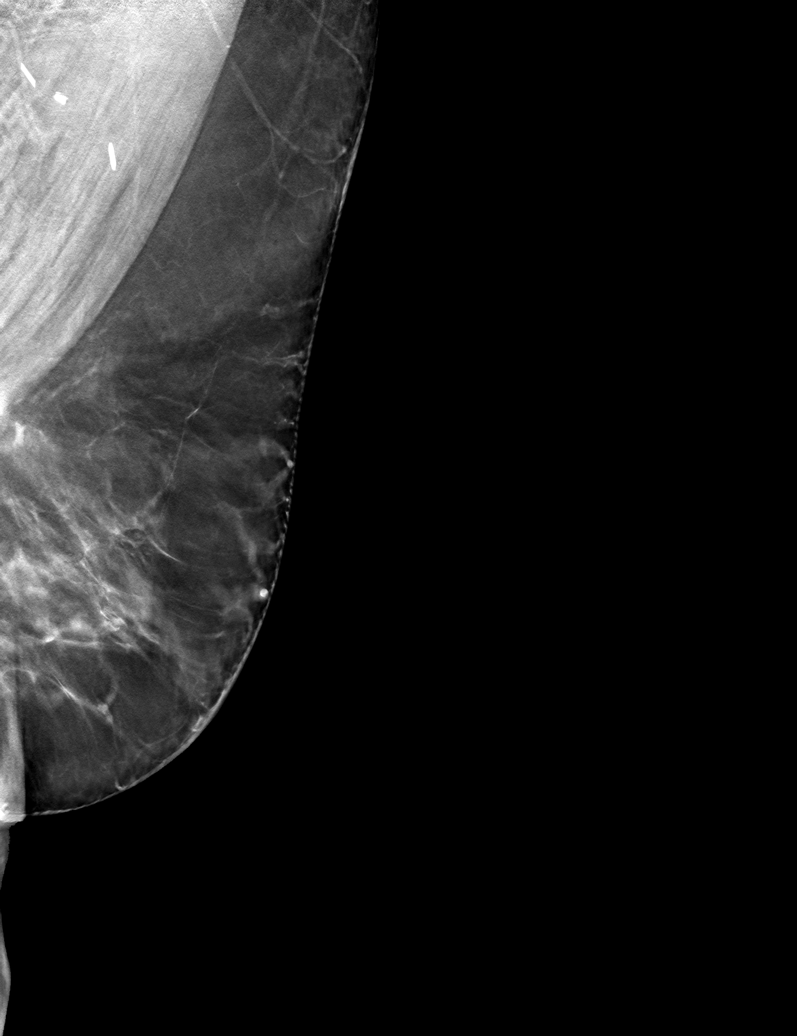

[L MLO tomo (2 of 2) · tomo slice 29/58.0]
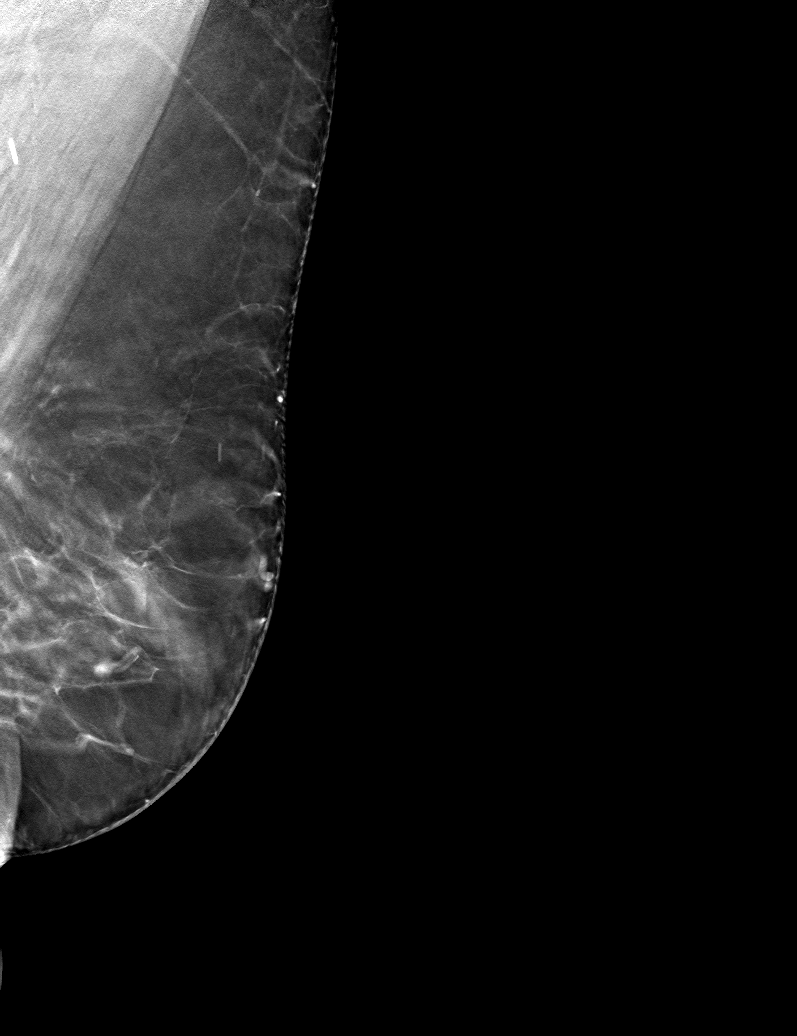

[6 of 18 positions shown; findings below may reference images not displayed]

ACR Breast Density Category b: There are scattered areas of
fibroglandular density.
FINDINGS: The patient has had a right mastectomy. There are no findings
suspicious for malignancy.

Images were processed with CAD.
IMPRESSION: No mammographic evidence of malignancy. Lumpectomy changes. A result
letter of this screening mammogram will be mailed directly to the
patient.

RECOMMENDATION:
Screening mammogram in one year.  (Code:G6-X-GE6)

BI-RADS CATEGORY  1: Negative.

## 2020-06-25 DIAGNOSIS — M170 Bilateral primary osteoarthritis of knee: Secondary | ICD-10-CM | POA: Diagnosis not present

## 2020-06-25 DIAGNOSIS — Z136 Encounter for screening for cardiovascular disorders: Secondary | ICD-10-CM | POA: Diagnosis not present

## 2020-06-25 DIAGNOSIS — Z712 Person consulting for explanation of examination or test findings: Secondary | ICD-10-CM | POA: Diagnosis not present

## 2020-06-25 DIAGNOSIS — M8580 Other specified disorders of bone density and structure, unspecified site: Secondary | ICD-10-CM | POA: Diagnosis not present

## 2020-06-25 DIAGNOSIS — U071 COVID-19: Secondary | ICD-10-CM | POA: Diagnosis not present

## 2020-06-25 DIAGNOSIS — R0600 Dyspnea, unspecified: Secondary | ICD-10-CM | POA: Diagnosis not present

## 2020-06-25 DIAGNOSIS — M160 Bilateral primary osteoarthritis of hip: Secondary | ICD-10-CM | POA: Diagnosis not present

## 2020-06-25 DIAGNOSIS — Z6834 Body mass index (BMI) 34.0-34.9, adult: Secondary | ICD-10-CM | POA: Diagnosis not present

## 2020-06-25 DIAGNOSIS — N1831 Chronic kidney disease, stage 3a: Secondary | ICD-10-CM | POA: Diagnosis not present

## 2020-06-25 DIAGNOSIS — N183 Chronic kidney disease, stage 3 unspecified: Secondary | ICD-10-CM | POA: Diagnosis not present

## 2020-06-25 DIAGNOSIS — J060 Acute laryngopharyngitis: Secondary | ICD-10-CM | POA: Diagnosis not present

## 2020-06-25 DIAGNOSIS — R9431 Abnormal electrocardiogram [ECG] [EKG]: Secondary | ICD-10-CM | POA: Diagnosis not present

## 2020-06-29 DIAGNOSIS — M8580 Other specified disorders of bone density and structure, unspecified site: Secondary | ICD-10-CM | POA: Diagnosis not present

## 2020-06-29 DIAGNOSIS — N1831 Chronic kidney disease, stage 3a: Secondary | ICD-10-CM | POA: Diagnosis not present

## 2020-06-29 DIAGNOSIS — Z0001 Encounter for general adult medical examination with abnormal findings: Secondary | ICD-10-CM | POA: Diagnosis not present

## 2020-06-29 DIAGNOSIS — C50919 Malignant neoplasm of unspecified site of unspecified female breast: Secondary | ICD-10-CM | POA: Diagnosis not present

## 2020-06-29 DIAGNOSIS — M1710 Unilateral primary osteoarthritis, unspecified knee: Secondary | ICD-10-CM | POA: Diagnosis not present

## 2020-06-29 DIAGNOSIS — Z6836 Body mass index (BMI) 36.0-36.9, adult: Secondary | ICD-10-CM | POA: Diagnosis not present

## 2020-06-29 DIAGNOSIS — I129 Hypertensive chronic kidney disease with stage 1 through stage 4 chronic kidney disease, or unspecified chronic kidney disease: Secondary | ICD-10-CM | POA: Diagnosis not present

## 2020-06-29 DIAGNOSIS — I10 Essential (primary) hypertension: Secondary | ICD-10-CM | POA: Diagnosis not present

## 2020-06-29 DIAGNOSIS — R7301 Impaired fasting glucose: Secondary | ICD-10-CM | POA: Diagnosis not present

## 2020-07-07 DIAGNOSIS — H52223 Regular astigmatism, bilateral: Secondary | ICD-10-CM | POA: Diagnosis not present

## 2020-07-07 DIAGNOSIS — H5203 Hypermetropia, bilateral: Secondary | ICD-10-CM | POA: Diagnosis not present

## 2020-07-07 DIAGNOSIS — H40059 Ocular hypertension, unspecified eye: Secondary | ICD-10-CM | POA: Diagnosis not present

## 2020-07-17 ENCOUNTER — Other Ambulatory Visit: Payer: Self-pay | Admitting: Internal Medicine

## 2020-07-17 DIAGNOSIS — Z1231 Encounter for screening mammogram for malignant neoplasm of breast: Secondary | ICD-10-CM

## 2020-08-13 DIAGNOSIS — Z6836 Body mass index (BMI) 36.0-36.9, adult: Secondary | ICD-10-CM | POA: Diagnosis not present

## 2020-08-13 DIAGNOSIS — C50919 Malignant neoplasm of unspecified site of unspecified female breast: Secondary | ICD-10-CM | POA: Diagnosis not present

## 2020-08-13 DIAGNOSIS — N1831 Chronic kidney disease, stage 3a: Secondary | ICD-10-CM | POA: Diagnosis not present

## 2020-08-13 DIAGNOSIS — I129 Hypertensive chronic kidney disease with stage 1 through stage 4 chronic kidney disease, or unspecified chronic kidney disease: Secondary | ICD-10-CM | POA: Diagnosis not present

## 2020-08-13 DIAGNOSIS — M1710 Unilateral primary osteoarthritis, unspecified knee: Secondary | ICD-10-CM | POA: Diagnosis not present

## 2020-08-13 DIAGNOSIS — M8580 Other specified disorders of bone density and structure, unspecified site: Secondary | ICD-10-CM | POA: Diagnosis not present

## 2020-08-13 DIAGNOSIS — R7301 Impaired fasting glucose: Secondary | ICD-10-CM | POA: Diagnosis not present

## 2020-08-13 DIAGNOSIS — I10 Essential (primary) hypertension: Secondary | ICD-10-CM | POA: Diagnosis not present

## 2020-08-28 ENCOUNTER — Other Ambulatory Visit: Payer: Self-pay

## 2020-08-28 ENCOUNTER — Ambulatory Visit
Admission: RE | Admit: 2020-08-28 | Discharge: 2020-08-28 | Disposition: A | Discharge status: Home / Self Care | Payer: PPO | Source: Ambulatory Visit | Attending: Internal Medicine | Admitting: Internal Medicine

## 2020-08-28 DIAGNOSIS — Z1231 Encounter for screening mammogram for malignant neoplasm of breast: Secondary | ICD-10-CM | POA: Diagnosis not present

## 2020-09-01 DIAGNOSIS — Z23 Encounter for immunization: Secondary | ICD-10-CM | POA: Diagnosis not present

## 2020-09-07 DIAGNOSIS — I129 Hypertensive chronic kidney disease with stage 1 through stage 4 chronic kidney disease, or unspecified chronic kidney disease: Secondary | ICD-10-CM | POA: Diagnosis not present

## 2020-09-07 DIAGNOSIS — M170 Bilateral primary osteoarthritis of knee: Secondary | ICD-10-CM | POA: Diagnosis not present

## 2020-09-07 DIAGNOSIS — M160 Bilateral primary osteoarthritis of hip: Secondary | ICD-10-CM | POA: Diagnosis not present

## 2020-09-07 DIAGNOSIS — N1831 Chronic kidney disease, stage 3a: Secondary | ICD-10-CM | POA: Diagnosis not present

## 2020-11-19 DIAGNOSIS — H47323 Drusen of optic disc, bilateral: Secondary | ICD-10-CM | POA: Diagnosis not present

## 2020-11-19 DIAGNOSIS — H40053 Ocular hypertension, bilateral: Secondary | ICD-10-CM | POA: Diagnosis not present

## 2020-11-19 DIAGNOSIS — H43813 Vitreous degeneration, bilateral: Secondary | ICD-10-CM | POA: Diagnosis not present

## 2020-11-19 DIAGNOSIS — H35363 Drusen (degenerative) of macula, bilateral: Secondary | ICD-10-CM | POA: Diagnosis not present

## 2020-11-19 DIAGNOSIS — H35033 Hypertensive retinopathy, bilateral: Secondary | ICD-10-CM | POA: Diagnosis not present

## 2020-11-19 DIAGNOSIS — H524 Presbyopia: Secondary | ICD-10-CM | POA: Diagnosis not present

## 2020-11-19 DIAGNOSIS — H52223 Regular astigmatism, bilateral: Secondary | ICD-10-CM | POA: Diagnosis not present

## 2020-11-19 DIAGNOSIS — H25013 Cortical age-related cataract, bilateral: Secondary | ICD-10-CM | POA: Diagnosis not present

## 2020-11-19 DIAGNOSIS — H5203 Hypermetropia, bilateral: Secondary | ICD-10-CM | POA: Diagnosis not present

## 2020-11-19 DIAGNOSIS — H18593 Other hereditary corneal dystrophies, bilateral: Secondary | ICD-10-CM | POA: Diagnosis not present

## 2020-11-19 DIAGNOSIS — I10 Essential (primary) hypertension: Secondary | ICD-10-CM | POA: Diagnosis not present

## 2020-12-17 DIAGNOSIS — Z96653 Presence of artificial knee joint, bilateral: Secondary | ICD-10-CM | POA: Diagnosis not present

## 2020-12-17 DIAGNOSIS — W19XXXA Unspecified fall, initial encounter: Secondary | ICD-10-CM | POA: Diagnosis not present

## 2021-01-06 DIAGNOSIS — J060 Acute laryngopharyngitis: Secondary | ICD-10-CM | POA: Diagnosis not present

## 2021-01-06 DIAGNOSIS — M170 Bilateral primary osteoarthritis of knee: Secondary | ICD-10-CM | POA: Diagnosis not present

## 2021-01-06 DIAGNOSIS — Z23 Encounter for immunization: Secondary | ICD-10-CM | POA: Diagnosis not present

## 2021-01-06 DIAGNOSIS — N1831 Chronic kidney disease, stage 3a: Secondary | ICD-10-CM | POA: Diagnosis not present

## 2021-01-06 DIAGNOSIS — Z6834 Body mass index (BMI) 34.0-34.9, adult: Secondary | ICD-10-CM | POA: Diagnosis not present

## 2021-01-06 DIAGNOSIS — M160 Bilateral primary osteoarthritis of hip: Secondary | ICD-10-CM | POA: Diagnosis not present

## 2021-01-06 DIAGNOSIS — N183 Chronic kidney disease, stage 3 unspecified: Secondary | ICD-10-CM | POA: Diagnosis not present

## 2021-01-06 DIAGNOSIS — I129 Hypertensive chronic kidney disease with stage 1 through stage 4 chronic kidney disease, or unspecified chronic kidney disease: Secondary | ICD-10-CM | POA: Diagnosis not present

## 2021-01-06 DIAGNOSIS — Z136 Encounter for screening for cardiovascular disorders: Secondary | ICD-10-CM | POA: Diagnosis not present

## 2021-01-06 DIAGNOSIS — R0600 Dyspnea, unspecified: Secondary | ICD-10-CM | POA: Diagnosis not present

## 2021-01-06 DIAGNOSIS — R9431 Abnormal electrocardiogram [ECG] [EKG]: Secondary | ICD-10-CM | POA: Diagnosis not present

## 2021-01-06 DIAGNOSIS — Z0001 Encounter for general adult medical examination with abnormal findings: Secondary | ICD-10-CM | POA: Diagnosis not present

## 2021-01-11 DIAGNOSIS — N1831 Chronic kidney disease, stage 3a: Secondary | ICD-10-CM | POA: Diagnosis not present

## 2021-01-11 DIAGNOSIS — M8580 Other specified disorders of bone density and structure, unspecified site: Secondary | ICD-10-CM | POA: Diagnosis not present

## 2021-01-11 DIAGNOSIS — G9009 Other idiopathic peripheral autonomic neuropathy: Secondary | ICD-10-CM | POA: Diagnosis not present

## 2021-01-11 DIAGNOSIS — M1710 Unilateral primary osteoarthritis, unspecified knee: Secondary | ICD-10-CM | POA: Diagnosis not present

## 2021-01-11 DIAGNOSIS — R7301 Impaired fasting glucose: Secondary | ICD-10-CM | POA: Diagnosis not present

## 2021-01-11 DIAGNOSIS — I10 Essential (primary) hypertension: Secondary | ICD-10-CM | POA: Diagnosis not present

## 2021-01-11 DIAGNOSIS — C50919 Malignant neoplasm of unspecified site of unspecified female breast: Secondary | ICD-10-CM | POA: Diagnosis not present

## 2021-01-12 ENCOUNTER — Other Ambulatory Visit (HOSPITAL_COMMUNITY): Payer: Self-pay | Admitting: Internal Medicine

## 2021-01-12 DIAGNOSIS — Z13820 Encounter for screening for osteoporosis: Secondary | ICD-10-CM

## 2021-01-19 ENCOUNTER — Inpatient Hospital Stay (HOSPITAL_COMMUNITY): Admission: RE | Admit: 2021-01-19 | Payer: PPO | Source: Ambulatory Visit

## 2021-02-03 ENCOUNTER — Ambulatory Visit (HOSPITAL_COMMUNITY)
Admission: RE | Admit: 2021-02-03 | Discharge: 2021-02-03 | Disposition: A | Discharge status: Home / Self Care | Payer: PPO | Source: Ambulatory Visit | Attending: Internal Medicine | Admitting: Internal Medicine

## 2021-02-03 ENCOUNTER — Other Ambulatory Visit: Payer: Self-pay

## 2021-02-03 DIAGNOSIS — Z780 Asymptomatic menopausal state: Secondary | ICD-10-CM | POA: Diagnosis not present

## 2021-02-03 DIAGNOSIS — Z853 Personal history of malignant neoplasm of breast: Secondary | ICD-10-CM | POA: Insufficient documentation

## 2021-02-03 DIAGNOSIS — M85832 Other specified disorders of bone density and structure, left forearm: Secondary | ICD-10-CM | POA: Insufficient documentation

## 2021-02-03 DIAGNOSIS — Z13820 Encounter for screening for osteoporosis: Secondary | ICD-10-CM | POA: Diagnosis not present

## 2021-02-10 DIAGNOSIS — M8580 Other specified disorders of bone density and structure, unspecified site: Secondary | ICD-10-CM | POA: Diagnosis not present

## 2021-02-10 DIAGNOSIS — N1831 Chronic kidney disease, stage 3a: Secondary | ICD-10-CM | POA: Diagnosis not present

## 2021-02-10 DIAGNOSIS — M1710 Unilateral primary osteoarthritis, unspecified knee: Secondary | ICD-10-CM | POA: Diagnosis not present

## 2021-02-10 DIAGNOSIS — R7301 Impaired fasting glucose: Secondary | ICD-10-CM | POA: Diagnosis not present

## 2021-02-10 DIAGNOSIS — I10 Essential (primary) hypertension: Secondary | ICD-10-CM | POA: Diagnosis not present

## 2021-02-10 DIAGNOSIS — G9009 Other idiopathic peripheral autonomic neuropathy: Secondary | ICD-10-CM | POA: Diagnosis not present

## 2021-02-10 DIAGNOSIS — C50919 Malignant neoplasm of unspecified site of unspecified female breast: Secondary | ICD-10-CM | POA: Diagnosis not present

## 2021-03-24 DIAGNOSIS — I129 Hypertensive chronic kidney disease with stage 1 through stage 4 chronic kidney disease, or unspecified chronic kidney disease: Secondary | ICD-10-CM | POA: Diagnosis not present

## 2021-03-24 DIAGNOSIS — I10 Essential (primary) hypertension: Secondary | ICD-10-CM | POA: Diagnosis not present

## 2021-05-13 DIAGNOSIS — I129 Hypertensive chronic kidney disease with stage 1 through stage 4 chronic kidney disease, or unspecified chronic kidney disease: Secondary | ICD-10-CM | POA: Diagnosis not present

## 2021-05-13 DIAGNOSIS — I10 Essential (primary) hypertension: Secondary | ICD-10-CM | POA: Diagnosis not present

## 2021-07-13 DIAGNOSIS — E039 Hypothyroidism, unspecified: Secondary | ICD-10-CM | POA: Diagnosis not present

## 2021-07-13 DIAGNOSIS — R7301 Impaired fasting glucose: Secondary | ICD-10-CM | POA: Diagnosis not present

## 2021-07-13 DIAGNOSIS — D519 Vitamin B12 deficiency anemia, unspecified: Secondary | ICD-10-CM | POA: Diagnosis not present

## 2021-07-13 DIAGNOSIS — I10 Essential (primary) hypertension: Secondary | ICD-10-CM | POA: Diagnosis not present

## 2021-07-14 DIAGNOSIS — I10 Essential (primary) hypertension: Secondary | ICD-10-CM | POA: Diagnosis not present

## 2021-07-14 DIAGNOSIS — I129 Hypertensive chronic kidney disease with stage 1 through stage 4 chronic kidney disease, or unspecified chronic kidney disease: Secondary | ICD-10-CM | POA: Diagnosis not present

## 2021-07-15 DIAGNOSIS — Z0001 Encounter for general adult medical examination with abnormal findings: Secondary | ICD-10-CM | POA: Diagnosis not present

## 2021-07-15 DIAGNOSIS — E7800 Pure hypercholesterolemia, unspecified: Secondary | ICD-10-CM | POA: Diagnosis not present

## 2021-07-15 DIAGNOSIS — H269 Unspecified cataract: Secondary | ICD-10-CM | POA: Diagnosis not present

## 2021-07-15 DIAGNOSIS — M8580 Other specified disorders of bone density and structure, unspecified site: Secondary | ICD-10-CM | POA: Diagnosis not present

## 2021-07-15 DIAGNOSIS — G609 Hereditary and idiopathic neuropathy, unspecified: Secondary | ICD-10-CM | POA: Diagnosis not present

## 2021-07-15 DIAGNOSIS — M1990 Unspecified osteoarthritis, unspecified site: Secondary | ICD-10-CM | POA: Diagnosis not present

## 2021-07-15 DIAGNOSIS — N1831 Chronic kidney disease, stage 3a: Secondary | ICD-10-CM | POA: Diagnosis not present

## 2021-07-15 DIAGNOSIS — G629 Polyneuropathy, unspecified: Secondary | ICD-10-CM | POA: Diagnosis not present

## 2021-07-15 DIAGNOSIS — R7301 Impaired fasting glucose: Secondary | ICD-10-CM | POA: Diagnosis not present

## 2021-07-15 DIAGNOSIS — Z853 Personal history of malignant neoplasm of breast: Secondary | ICD-10-CM | POA: Diagnosis not present

## 2021-07-15 DIAGNOSIS — I10 Essential (primary) hypertension: Secondary | ICD-10-CM | POA: Diagnosis not present

## 2021-07-22 ENCOUNTER — Other Ambulatory Visit: Payer: Self-pay | Admitting: Internal Medicine

## 2021-07-22 DIAGNOSIS — Z1231 Encounter for screening mammogram for malignant neoplasm of breast: Secondary | ICD-10-CM

## 2021-09-02 ENCOUNTER — Other Ambulatory Visit: Payer: Self-pay

## 2021-09-02 ENCOUNTER — Ambulatory Visit
Admission: RE | Admit: 2021-09-02 | Discharge: 2021-09-02 | Disposition: A | Discharge status: Home / Self Care | Payer: PPO | Source: Ambulatory Visit | Attending: Internal Medicine | Admitting: Internal Medicine

## 2021-09-02 DIAGNOSIS — Z1231 Encounter for screening mammogram for malignant neoplasm of breast: Secondary | ICD-10-CM | POA: Diagnosis not present

## 2021-09-10 DIAGNOSIS — Z23 Encounter for immunization: Secondary | ICD-10-CM | POA: Diagnosis not present

## 2021-09-13 DIAGNOSIS — I129 Hypertensive chronic kidney disease with stage 1 through stage 4 chronic kidney disease, or unspecified chronic kidney disease: Secondary | ICD-10-CM | POA: Diagnosis not present

## 2021-09-13 DIAGNOSIS — I10 Essential (primary) hypertension: Secondary | ICD-10-CM | POA: Diagnosis not present

## 2022-01-13 DIAGNOSIS — R7301 Impaired fasting glucose: Secondary | ICD-10-CM | POA: Diagnosis not present

## 2022-01-13 DIAGNOSIS — E7800 Pure hypercholesterolemia, unspecified: Secondary | ICD-10-CM | POA: Diagnosis not present

## 2022-01-17 DIAGNOSIS — E782 Mixed hyperlipidemia: Secondary | ICD-10-CM | POA: Diagnosis not present

## 2022-01-17 DIAGNOSIS — G609 Hereditary and idiopathic neuropathy, unspecified: Secondary | ICD-10-CM | POA: Diagnosis not present

## 2022-01-17 DIAGNOSIS — I10 Essential (primary) hypertension: Secondary | ICD-10-CM | POA: Diagnosis not present

## 2022-01-17 DIAGNOSIS — Z853 Personal history of malignant neoplasm of breast: Secondary | ICD-10-CM | POA: Diagnosis not present

## 2022-01-17 DIAGNOSIS — M1990 Unspecified osteoarthritis, unspecified site: Secondary | ICD-10-CM | POA: Diagnosis not present

## 2022-01-17 DIAGNOSIS — H269 Unspecified cataract: Secondary | ICD-10-CM | POA: Diagnosis not present

## 2022-01-17 DIAGNOSIS — N1831 Chronic kidney disease, stage 3a: Secondary | ICD-10-CM | POA: Diagnosis not present

## 2022-01-17 DIAGNOSIS — R7301 Impaired fasting glucose: Secondary | ICD-10-CM | POA: Diagnosis not present

## 2022-01-17 DIAGNOSIS — M8580 Other specified disorders of bone density and structure, unspecified site: Secondary | ICD-10-CM | POA: Diagnosis not present

## 2022-04-22 DIAGNOSIS — H25013 Cortical age-related cataract, bilateral: Secondary | ICD-10-CM | POA: Diagnosis not present

## 2022-04-22 DIAGNOSIS — H25043 Posterior subcapsular polar age-related cataract, bilateral: Secondary | ICD-10-CM | POA: Diagnosis not present

## 2022-04-22 DIAGNOSIS — H2511 Age-related nuclear cataract, right eye: Secondary | ICD-10-CM | POA: Diagnosis not present

## 2022-04-22 DIAGNOSIS — H2513 Age-related nuclear cataract, bilateral: Secondary | ICD-10-CM | POA: Diagnosis not present

## 2022-04-22 DIAGNOSIS — H18413 Arcus senilis, bilateral: Secondary | ICD-10-CM | POA: Diagnosis not present

## 2022-07-06 DIAGNOSIS — H2511 Age-related nuclear cataract, right eye: Secondary | ICD-10-CM | POA: Diagnosis not present

## 2022-07-07 DIAGNOSIS — H2512 Age-related nuclear cataract, left eye: Secondary | ICD-10-CM | POA: Diagnosis not present

## 2022-07-13 DIAGNOSIS — E782 Mixed hyperlipidemia: Secondary | ICD-10-CM | POA: Diagnosis not present

## 2022-07-13 DIAGNOSIS — R7301 Impaired fasting glucose: Secondary | ICD-10-CM | POA: Diagnosis not present

## 2022-07-20 DIAGNOSIS — Z853 Personal history of malignant neoplasm of breast: Secondary | ICD-10-CM | POA: Diagnosis not present

## 2022-07-20 DIAGNOSIS — G609 Hereditary and idiopathic neuropathy, unspecified: Secondary | ICD-10-CM | POA: Diagnosis not present

## 2022-07-20 DIAGNOSIS — Z6830 Body mass index (BMI) 30.0-30.9, adult: Secondary | ICD-10-CM | POA: Diagnosis not present

## 2022-07-20 DIAGNOSIS — M8580 Other specified disorders of bone density and structure, unspecified site: Secondary | ICD-10-CM | POA: Diagnosis not present

## 2022-07-20 DIAGNOSIS — M1990 Unspecified osteoarthritis, unspecified site: Secondary | ICD-10-CM | POA: Diagnosis not present

## 2022-07-20 DIAGNOSIS — R7301 Impaired fasting glucose: Secondary | ICD-10-CM | POA: Diagnosis not present

## 2022-07-20 DIAGNOSIS — I10 Essential (primary) hypertension: Secondary | ICD-10-CM | POA: Diagnosis not present

## 2022-07-20 DIAGNOSIS — N1831 Chronic kidney disease, stage 3a: Secondary | ICD-10-CM | POA: Diagnosis not present

## 2022-07-20 DIAGNOSIS — E782 Mixed hyperlipidemia: Secondary | ICD-10-CM | POA: Diagnosis not present

## 2022-07-20 DIAGNOSIS — E669 Obesity, unspecified: Secondary | ICD-10-CM | POA: Diagnosis not present

## 2022-07-20 DIAGNOSIS — H269 Unspecified cataract: Secondary | ICD-10-CM | POA: Diagnosis not present

## 2022-07-25 ENCOUNTER — Other Ambulatory Visit: Payer: Self-pay | Admitting: Internal Medicine

## 2022-07-25 DIAGNOSIS — Z1231 Encounter for screening mammogram for malignant neoplasm of breast: Secondary | ICD-10-CM

## 2022-07-27 DIAGNOSIS — H2512 Age-related nuclear cataract, left eye: Secondary | ICD-10-CM | POA: Diagnosis not present

## 2022-08-03 DIAGNOSIS — H52223 Regular astigmatism, bilateral: Secondary | ICD-10-CM | POA: Diagnosis not present

## 2022-08-03 DIAGNOSIS — H5202 Hypermetropia, left eye: Secondary | ICD-10-CM | POA: Diagnosis not present

## 2022-08-03 DIAGNOSIS — Z961 Presence of intraocular lens: Secondary | ICD-10-CM | POA: Diagnosis not present

## 2022-08-03 DIAGNOSIS — Z9849 Cataract extraction status, unspecified eye: Secondary | ICD-10-CM | POA: Diagnosis not present

## 2022-08-03 DIAGNOSIS — H2512 Age-related nuclear cataract, left eye: Secondary | ICD-10-CM | POA: Diagnosis not present

## 2022-08-19 DIAGNOSIS — Z0000 Encounter for general adult medical examination without abnormal findings: Secondary | ICD-10-CM | POA: Diagnosis not present

## 2022-09-05 ENCOUNTER — Ambulatory Visit
Admission: RE | Admit: 2022-09-05 | Discharge: 2022-09-05 | Disposition: A | Discharge status: Home / Self Care | Payer: PPO | Source: Ambulatory Visit | Attending: Internal Medicine | Admitting: Internal Medicine

## 2022-09-05 DIAGNOSIS — Z1231 Encounter for screening mammogram for malignant neoplasm of breast: Secondary | ICD-10-CM

## 2022-09-07 ENCOUNTER — Other Ambulatory Visit: Payer: Self-pay | Admitting: Internal Medicine

## 2022-09-07 DIAGNOSIS — R928 Other abnormal and inconclusive findings on diagnostic imaging of breast: Secondary | ICD-10-CM

## 2022-09-19 ENCOUNTER — Ambulatory Visit
Admission: RE | Admit: 2022-09-19 | Discharge: 2022-09-19 | Disposition: A | Discharge status: Home / Self Care | Payer: PPO | Source: Ambulatory Visit | Attending: Internal Medicine | Admitting: Internal Medicine

## 2022-09-19 ENCOUNTER — Other Ambulatory Visit: Payer: Self-pay | Admitting: Internal Medicine

## 2022-09-19 DIAGNOSIS — Z853 Personal history of malignant neoplasm of breast: Secondary | ICD-10-CM | POA: Diagnosis not present

## 2022-09-19 DIAGNOSIS — R928 Other abnormal and inconclusive findings on diagnostic imaging of breast: Secondary | ICD-10-CM

## 2022-09-19 DIAGNOSIS — N6320 Unspecified lump in the left breast, unspecified quadrant: Secondary | ICD-10-CM

## 2022-09-19 DIAGNOSIS — R92322 Mammographic fibroglandular density, left breast: Secondary | ICD-10-CM | POA: Diagnosis not present

## 2022-09-19 DIAGNOSIS — N6489 Other specified disorders of breast: Secondary | ICD-10-CM | POA: Diagnosis not present

## 2022-09-22 ENCOUNTER — Ambulatory Visit
Admission: RE | Admit: 2022-09-22 | Discharge: 2022-09-22 | Disposition: A | Discharge status: Home / Self Care | Payer: PPO | Source: Ambulatory Visit | Attending: Internal Medicine | Admitting: Internal Medicine

## 2022-09-22 DIAGNOSIS — Z853 Personal history of malignant neoplasm of breast: Secondary | ICD-10-CM | POA: Diagnosis not present

## 2022-09-22 DIAGNOSIS — N6320 Unspecified lump in the left breast, unspecified quadrant: Secondary | ICD-10-CM

## 2022-09-22 DIAGNOSIS — N6321 Unspecified lump in the left breast, upper outer quadrant: Secondary | ICD-10-CM | POA: Diagnosis not present

## 2022-09-22 DIAGNOSIS — C50412 Malignant neoplasm of upper-outer quadrant of left female breast: Secondary | ICD-10-CM | POA: Diagnosis not present

## 2022-09-22 HISTORY — PX: BREAST BIOPSY: SHX20

## 2022-10-13 ENCOUNTER — Other Ambulatory Visit: Payer: Self-pay | Admitting: General Surgery

## 2022-10-13 DIAGNOSIS — Z170 Estrogen receptor positive status [ER+]: Secondary | ICD-10-CM | POA: Diagnosis not present

## 2022-10-13 DIAGNOSIS — C50412 Malignant neoplasm of upper-outer quadrant of left female breast: Secondary | ICD-10-CM | POA: Diagnosis not present

## 2022-10-17 ENCOUNTER — Other Ambulatory Visit: Payer: Self-pay | Admitting: *Deleted

## 2022-10-17 DIAGNOSIS — C50919 Malignant neoplasm of unspecified site of unspecified female breast: Secondary | ICD-10-CM

## 2022-10-18 NOTE — Therapy (Signed)
OUTPATIENT PHYSICAL THERAPY BREAST CANCER BASELINE EVALUATION   Patient Name: Haley Peterson MRN: [id number] DOB:18-Feb-1946, 76 y.o., female Today's Date: 10/19/2022  END OF SESSION:  PT End of Session - 10/19/22 1041     Visit Number 1    Number of Visits 2    Date for PT Re-Evaluation 11/28/21    PT Start Time 1004    PT Stop Time 1037    PT Time Calculation (min) 33 min    Activity Tolerance Patient tolerated treatment well    Behavior During Therapy WFL for tasks assessed/performed             Past Medical History:  Diagnosis Date   Arthritis    Bone spur    left heel   Breast cancer ([place])    bilateral   Cancer ([place])    Bilateral Breast cancer   Cataract    right eye    CKD (chronic kidney disease), stage III ([place])    mgd by PCP    History of breast cancer    left, right   Hypertension    Personal history of radiation therapy    Pneumonia 10/2012   Past Surgical History:  Procedure Laterality Date   ABDOMINAL HYSTERECTOMY     BIOPSY  04/12/2017   Procedure: BIOPSY;  Surgeon: Rogene Houston, MD;  [facility];  Service: Endoscopy;;  ascending colon ulcer biopsies   BREAST BIOPSY Left 09/22/2022   [place] LT BREAST BX W LOC DEV EA ADD LESION IMG BX SPEC US GUIDE 09/22/2022 [facility]   BREAST BIOPSY Left 09/22/2022   [place] LT BREAST BX W LOC DEV 1ST LESION IMG BX [place] US GUIDE 09/22/2022 [facility]   BREAST LUMPECTOMY  08/28/2001   left   BREAST RECONSTRUCTION Right    implant   CHOLECYSTECTOMY  2002   COLONOSCOPY     COLONOSCOPY N/A 04/12/2017   Procedure: COLONOSCOPY;  Surgeon: Rogene Houston, MD;  [facility];  Service: Endoscopy;  Laterality: N/A;  [place]  08/15/2006   right   PARTIAL HYSTERECTOMY  1985   REPLACEMENT TOTAL KNEE  2009, 2010   right-2010, left 2009   TISSUE EXPANDER PLACEMENT     TOTAL HIP ARTHROPLASTY Right 10/09/2017   Procedure: RIGHT TOTAL HIP ARTHROPLASTY ANTERIOR APPROACH;  Surgeon:  Frederik Pear, MD;  Location: [facility];  Service: Orthopedics;  Laterality: Right;   TOTAL HIP ARTHROPLASTY Left 09/23/2019   Procedure: Left Anterior Hip Arthroplasty;  Surgeon: Frederik Pear, MD;  Location: [facility];  Service: Orthopedics;  Laterality: Left;   Patient Active Problem List   Diagnosis Date Noted   S/P hip replacement, left 09/23/2019   Osteoarthritis of left hip 09/20/2019   Primary osteoarthritis of right hip 10/09/2017   Osteoarthritis of right hip 10/04/2017   Special screening for malignant neoplasms, colon 12/30/2016   Breast cancer ([place]) 06/27/2011   Hx Breast cancer, Right, multifocal IDC, receptor +, Her2 - 06/07/2006    Class: Stage 1   Hx Breast cancer ([facility]), Right, Stage 0,  08/08/2001    PCP: Dr. Allyn Kenner  REFERRING PROVIDER: Dr. Donne Hazel  REFERRING DIAG:  Diagnosis C50.919 (ICD-10-CM) - Malignant neoplasm of female breast, unspecified estrogen receptor status, unspecified laterality, unspecified site of breast ([place])  THERAPY DIAG:  Abnormal posture  At risk for lymphedema  Malignant neoplasm of female breast, unspecified estrogen receptor status, unspecified laterality, unspecified site of breast ([place])  Rationale for Evaluation and  Treatment: Rehabilitation  ONSET DATE: 10/13/22  SUBJECTIVE:                                                                                                                                                                                           SUBJECTIVE STATEMENT: Patient reports she is here today to be seen by her medical team for her newly diagnosed left breast cancer.   PERTINENT HISTORY:  Patient was diagnosed on 10/13/22 with left breast cancer in 2 locations measuring around 2.5cm in total. Negative axillary [place]. grade 2 IDC. It is ER/PR/HER2 positive with a Ki67 of 20%.  Prior hx of Lt lumpectomy with radiation and SLNB with 1 negative lymph node removed in 2002 and Rt mastectomy with implant in 2007. Will be  having left mastectomy without reconstruction due to prior radiation. SLNB will be attempted but aborted if nothing identified due to prior surgery.   PATIENT GOALS:   reduce lymphedema risk and learn post op HEP.   PAIN:  Are you having pain? no  PRECAUTIONS: Active CA  HAND DOMINANCE: right  WEIGHT BEARING RESTRICTIONS: No  FALLS:  Has patient fallen in last 6 months? Yes. Number of falls 2, with in the past 8-9 months, one is a trip when walking and the other was tripping over the dishwasher. No feeling of off balance.    LIVING ENVIRONMENT: Patient lives with: alone Lives in: House/apartment Brother and sister and son will help after surgery.   OCCUPATION: Retired  LEISURE: I walk every day 50-22mn every day.  [place] [place].    PRIOR LEVEL OF FUNCTION: Independent   OBJECTIVE:  COGNITION: Overall cognitive status: Within functional limits for tasks assessed    POSTURE:  Forward head and rounded shoulders posture  UPPER EXTREMITY AROM/PROM:    A/PROM LEFT   eval  Shoulder extension   Shoulder flexion 145  Shoulder abduction 165  Shoulder internal rotation 70  Shoulder external rotation 10    (Blank rows = not tested)  CERVICAL AROM: All within normal limits:   UPPER EXTREMITY STRENGTH: WNL  LYMPHEDEMA ASSESSMENTS:   LANDMARK RIGHT   eval  10 cm proximal to olecranon process 29.3  Olecranon process 25.7  10 cm proximal to ulnar styloid process 21.2  Just proximal to ulnar styloid process 15.3  Across hand at thumb web space 18.2  At base of 2nd digit 5.7  (Blank rows = not tested)  LANDMARK LEFT   eval  10 cm proximal to olecranon process 30  Olecranon process 26  10 cm proximal to ulnar  styloid process 21  Just proximal to ulnar styloid process 15.4  Across hand at thumb web space 18  At base of 2nd digit 6.0  (Blank rows = not tested)  L-DEX LYMPHEDEMA SCREENING: The patient was assessed  using the L-Dex machine today to produce a lymphedema index baseline score. The patient will be reassessed on a regular basis (typically every 3 months) to obtain new L-Dex scores. If the score is > 6.5 points away from his/her baseline score indicating onset of subclinical lymphedema, it will be recommended to wear a compression garment for 4 weeks, 12 hours per day and then be reassessed. If the score continues to be > 6.5 points from baseline at reassessment, we will initiate lymphedema treatment. Assessing in this manner has a 95% rate of preventing clinically significant lymphedema.  QUICK DASH SURVEY: 0%  PATIENT EDUCATION:  Education details: Lymphedema risk reduction and post op shoulder/posture HEP Person educated: Patient Education method: Explanation, Demonstration, Handout Education comprehension: Patient verbalized understanding and returned demonstration  HOME EXERCISE PROGRAM: Patient was instructed today in a home exercise program today for post op shoulder range of motion. These included active assist shoulder flexion in sitting, scapular retraction, wall walking with shoulder abduction, and hands behind head external rotation.  She was encouraged to do these twice a day, holding 3 seconds and repeating 5 times when permitted by her physician.  ASSESSMENT: CLINICAL IMPRESSION: Pt is planning to have a Lt mastectomy with or without SLNB. She will benefit from a post op PT reassessment to determine needs and from L-Dex screens every 3 months for 2 years to detect subclinical lymphedema.  Pt will benefit from skilled therapeutic intervention to improve on the following deficits: Decreased knowledge of precautions, impaired UE functional use, pain, decreased ROM, postural dysfunction.   PT treatment/interventions: ADL/self-care home management, pt/family education, therapeutic exercise  REHAB POTENTIAL: Excellent  CLINICAL DECISION MAKING: Stable/uncomplicated  EVALUATION  COMPLEXITY: Low   GOALS: Goals reviewed with patient? YES  LONG TERM GOALS: (STG=LTG)    Name Target Date Goal status  1 Pt will be able to verbalize understanding of pertinent lymphedema risk reduction practices relevant to her dx specifically related to skin care.  Baseline:  No knowledge 10/19/2022 Achieved at eval  2 Pt will be able to return demo and/or verbalize understanding of the post op HEP related to regaining shoulder ROM. Baseline:  No knowledge 10/19/2022 Achieved at eval  3 Pt will be able to verbalize understanding of the importance of attending the post op After Breast CA Class for further lymphedema risk reduction education and therapeutic exercise.  Baseline:  No knowledge 10/19/2022 Achieved at eval  4 Pt will demo she has regained full shoulder ROM and function post operatively compared to baselines.  Baseline: See objective measurements taken today. 11/28/21     PLAN:  PT FREQUENCY/DURATION: EVAL and 1 follow up appointment.   PLAN FOR NEXT SESSION: will reassess 3-4 weeks post op to determine needs.   Patient will follow up at outpatient cancer rehab 3-4 weeks following surgery.  If the patient requires physical therapy at that time, a specific plan will be dictated and sent to the referring physician for approval. The patient was educated today on appropriate basic range of motion exercises to begin post operatively and the importance of attending the After Breast Cancer class following surgery.  Patient was educated today on lymphedema risk reduction practices as it pertains to recommendations that will benefit the patient immediately following surgery.  She verbalized good understanding.    Physical Therapy Information for After Breast Cancer Surgery/Treatment:  Lymphedema is a swelling condition that you may be at risk for in your arm if you have lymph nodes removed from the armpit area.  After a sentinel node biopsy, the risk is approximately 5-9% and is higher  after an axillary node dissection.  There is treatment available for this condition and it is not life-threatening.  Contact your physician or physical therapist with concerns. You may begin the 4 shoulder/posture exercises (see additional sheet) when permitted by your physician (typically a week after surgery).  If you have drains, you may need to wait until those are removed before beginning range of motion exercises.  A general recommendation is to not lift your arms above shoulder height until drains are removed.  These exercises should be done to your tolerance and gently.  This is not a "no pain/no gain" type of recovery so listen to your body and stretch into the range of motion that you can tolerate, stopping if you have pain.  If you are having immediate reconstruction, ask your plastic surgeon about doing exercises as he or she may want you to wait. We encourage you to attend the free one time ABC (After Breast Cancer) class offered by [facility].  You will learn information related to lymphedema risk, prevention and treatment and additional exercises to regain mobility following surgery.  You can call [phone] for more information.  This is offered the 1st and 3rd Monday of each month.  You only attend the class one time. While undergoing any medical procedure or treatment, try to avoid blood pressure being taken or needle sticks from occurring on the arm on the side of cancer.   This recommendation begins after surgery and continues for the rest of your life.  This may help reduce your risk of getting lymphedema (swelling in your arm). An excellent resource for those seeking information on lymphedema is the National Lymphedema Network's web site. It can be accessed at [place].org If you notice swelling in your hand, arm or breast at any time following surgery (even if it is many years from now), please contact your doctor or physical therapist to discuss this.   Lymphedema can be treated at any time but it is easier for you if it is treated early on.  If you feel like your shoulder motion is not returning to normal in a reasonable amount of time, please contact your surgeon or physical therapist.  [facility] [phone]. [address], [place]  [address]  ABC CLASS After Breast Cancer Class  After Breast Cancer Class is a specially designed exercise class to assist you in a safe recover after having breast cancer surgery.  In this class you will learn how to get back to full function whether your drains were just removed or if you had surgery a month ago.  This one-time class is held the 1st and 3rd Monday of every month from 11:00 a.m. until 12:00 noon virtually.  This class is FREE and space is limited. For more information or to register for the next available class, call [phone].  Class Goals  Understand specific stretches to improve the flexibility of you chest and shoulder. Learn ways to safely strengthen your upper body and improve your posture. Understand the warning signs of infection and why you may be at risk for an arm infection. Learn about Lymphedema and  prevention.  ** You do not attend this class until after surgery.  Drains must be removed to participate  Patient was instructed today in a home exercise program today for post op shoulder range of motion. These included active assist shoulder flexion in sitting, scapular retraction, wall walking with shoulder abduction, and hands behind head external rotation.  She was encouraged to do these twice a day, holding 3 seconds and repeating 5 times when permitted by her physician.    Stark Bray, PT 10/19/2022, 10:42 AM

## 2022-10-19 ENCOUNTER — Other Ambulatory Visit: Payer: Self-pay

## 2022-10-19 ENCOUNTER — Encounter: Payer: Self-pay | Admitting: Rehabilitation

## 2022-10-19 ENCOUNTER — Ambulatory Visit: Payer: PPO | Attending: General Surgery | Admitting: Rehabilitation

## 2022-10-19 DIAGNOSIS — R293 Abnormal posture: Secondary | ICD-10-CM | POA: Insufficient documentation

## 2022-10-19 DIAGNOSIS — Z9189 Other specified personal risk factors, not elsewhere classified: Secondary | ICD-10-CM | POA: Diagnosis not present

## 2022-10-19 DIAGNOSIS — C50919 Malignant neoplasm of unspecified site of unspecified female breast: Secondary | ICD-10-CM | POA: Insufficient documentation

## 2022-10-24 ENCOUNTER — Other Ambulatory Visit: Payer: Self-pay

## 2022-10-24 ENCOUNTER — Encounter (HOSPITAL_BASED_OUTPATIENT_CLINIC_OR_DEPARTMENT_OTHER): Payer: Self-pay | Admitting: General Surgery

## 2022-10-25 DIAGNOSIS — C50412 Malignant neoplasm of upper-outer quadrant of left female breast: Secondary | ICD-10-CM | POA: Insufficient documentation

## 2022-10-26 ENCOUNTER — Inpatient Hospital Stay: Payer: PPO

## 2022-10-26 ENCOUNTER — Inpatient Hospital Stay: Payer: PPO | Attending: Hematology | Admitting: Hematology

## 2022-10-26 VITALS — BP 126/57 | HR 78 | Temp 98.4°F | Resp 16 | Wt 178.2 lb

## 2022-10-26 DIAGNOSIS — Z853 Personal history of malignant neoplasm of breast: Secondary | ICD-10-CM | POA: Diagnosis not present

## 2022-10-26 DIAGNOSIS — Z9011 Acquired absence of right breast and nipple: Secondary | ICD-10-CM | POA: Insufficient documentation

## 2022-10-26 DIAGNOSIS — C50412 Malignant neoplasm of upper-outer quadrant of left female breast: Secondary | ICD-10-CM | POA: Insufficient documentation

## 2022-10-26 DIAGNOSIS — Z170 Estrogen receptor positive status [ER+]: Secondary | ICD-10-CM | POA: Diagnosis not present

## 2022-10-26 DIAGNOSIS — Z923 Personal history of irradiation: Secondary | ICD-10-CM | POA: Insufficient documentation

## 2022-10-26 DIAGNOSIS — Z86000 Personal history of in-situ neoplasm of breast: Secondary | ICD-10-CM | POA: Insufficient documentation

## 2022-10-26 DIAGNOSIS — Z79899 Other long term (current) drug therapy: Secondary | ICD-10-CM | POA: Diagnosis not present

## 2022-10-26 LAB — COMPREHENSIVE METABOLIC PANEL
ALT: 18 U/L (ref 0–44)
AST: 17 U/L (ref 15–41)
Albumin: 3.8 g/dL (ref 3.5–5.0)
Alkaline Phosphatase: 65 U/L (ref 38–126)
Anion gap: 7 (ref 5–15)
BUN: 28 mg/dL — ABNORMAL HIGH (ref 8–23)
CO2: 27 mmol/L (ref 22–32)
Calcium: 9.2 mg/dL (ref 8.9–10.3)
Chloride: 108 mmol/L (ref 98–111)
Creatinine, Ser: 1.16 mg/dL — ABNORMAL HIGH (ref 0.44–1.00)
GFR, Estimated: 49 mL/min — ABNORMAL LOW (ref >60)
Glucose, Bld: 102 mg/dL — ABNORMAL HIGH (ref 70–99)
Potassium: 4.3 mmol/L (ref 3.5–5.1)
Sodium: 142 mmol/L (ref 135–145)
Total Bilirubin: 0.5 mg/dL (ref 0.3–1.2)
Total Protein: 6.9 g/dL (ref 6.5–8.1)

## 2022-10-26 LAB — CBC WITH DIFFERENTIAL/PLATELET
Abs Immature Granulocytes: 0.02 10*3/uL (ref 0.00–0.07)
Basophils Absolute: 0.1 10*3/uL (ref 0.0–0.1)
Basophils Relative: 1 %
Eosinophils Absolute: 0.3 10*3/uL (ref 0.0–0.5)
Eosinophils Relative: 3 %
HCT: 41.2 % (ref 36.0–46.0)
Hemoglobin: 13.3 g/dL (ref 12.0–15.0)
Immature Granulocytes: 0 %
Lymphocytes Relative: 19 %
Lymphs Abs: 1.7 10*3/uL (ref 0.7–4.0)
MCH: 29.7 pg (ref 26.0–34.0)
MCHC: 32.3 g/dL (ref 30.0–36.0)
MCV: 92 fL (ref 80.0–100.0)
Monocytes Absolute: 0.8 10*3/uL (ref 0.1–1.0)
Monocytes Relative: 9 %
Neutro Abs: 5.9 10*3/uL (ref 1.7–7.7)
Neutrophils Relative %: 68 %
Platelets: 302 10*3/uL (ref 150–400)
RBC: 4.48 MIL/uL (ref 3.87–5.11)
RDW: 12.8 % (ref 11.5–15.5)
WBC: 8.8 10*3/uL (ref 4.0–10.5)
nRBC: 0 % (ref 0.0–0.2)

## 2022-10-26 NOTE — Patient Instructions (Addendum)
[facility]  Discharge Instructions  You were seen and examined today by Dr. Delton Coombes. Dr. Delton Coombes is a medical oncologist, meaning that he specializes in the treatment of cancer diagnoses. Dr. Delton Coombes discussed your past medical history, family history of cancers, and the events that led to you being here today.  You were referred back to Dr. Delton Coombes due to a new diagnosis of invasive ductal carcinoma, this is a common type of breast cancer. Your specific breast cancer is HER2 positive. This is typically a more aggressive breast cancer. Dr. Delton Coombes and Dr. Donne Hazel agree that the recommendation is for complete mastectomy.  Following surgery, Dr. Delton Coombes has recommended chemotherapy and anti-HER2 therapy for a total of a year. These are given once every 3 weeks.  Follow-up as scheduled.  Thank you for choosing [facility] to provide your oncology and hematology care.   To afford each patient quality time with our provider, please arrive at least 15 minutes before your scheduled appointment time. You may need to reschedule your appointment if you arrive late (10 or more minutes). Arriving late affects you and other patients whose appointments are after yours.  Also, if you miss three or more appointments without notifying the office, you may be dismissed from the clinic at the provider's discretion.    Again, thank you for choosing [facility].  Our hope is that these requests will decrease the amount of time that you wait before being seen by our physicians.   If you have a lab appointment with the [facility] please come in thru the Main Entrance and check in at the main information desk.           _____________________________________________________________  Should you have questions after your visit to [facility], please contact our office at [phone] and follow the prompts.  Our  office hours are 8:00 a.m. to 4:30 p.m. Monday - Thursday and 8:00 a.m. to 2:30 p.m. Friday.  Please note that voicemails left after 4:00 p.m. may not be returned until the following business day.  We are closed weekends and all major holidays.  You do have access to a nurse 24-7, just call the main number to the clinic [phone] and do not press any options, hold on the line and a nurse will answer the phone.    For prescription refill requests, have your pharmacy contact our office and allow 72 hours.    Masks are optional in the cancer centers. If you would like for your care team to wear a mask while they are taking care of you, please let them know. You may have one support person who is at least 76 years old accompany you for your appointments.

## 2022-10-26 NOTE — Progress Notes (Signed)
[facility] NOTE  Patient Care Team: Celene Squibb, MD as PCP - General (Internal Medicine) Bonne Dolores, MD (Inactive) (Internal Medicine) Neldon Mc, MD (Inactive) as Surgeon (General Surgery) Derek Jack, MD as Medical Oncologist (Medical Oncology) Brien Mates, RN as Oncology Nurse Navigator (Medical Oncology)  CHIEF COMPLAINTS/PURPOSE OF CONSULTATION:  HER2 positive left breast cancer  HISTORY OF PRESENTING ILLNESS:  Haley Peterson 76 y.o. female is seen in consultation today at the request of Dr. Donne Hazel for further workup and management of newly diagnosed HER2 positive left breast cancer.  She had abnormal screening mammogram.  Left breast diagnostic mammogram and ultrasound on 09/19/2022 showed 2 suspicious masses in the 2 o'clock position of the left breast with no left axillary adenopathy.  She underwent biopsy on 09/22/2022.  One of the lesions was HER2 positive and both lesions were strongly ER/PR positive.  She was evaluated by Dr. Donne Hazel and mastectomy was recommended.  She had a prior history of right breast cancer in 2007 and underwent right mastectomy.  She also has history of left lumpectomy with radiation therapy in 2002.  She lives by herself and is independent of ADLs and IADLs.  She has lost 32 pounds in the last 1 year but was trying to lose weight by cutting back on sweets and exercising. She attained menarche at age 41, menopause at age 58 when she underwent TAH secondary to fibroids.  She has 1 son and her age at first childbirth was 54.  She denies any hormone replacement therapy or OCPs.  MEDICAL HISTORY:  Past Medical History:  Diagnosis Date   Arthritis    Bone spur    left heel   Breast cancer ([place])    bilateral   Cancer ([place])    Bilateral Breast cancer   Cataract    right eye    CKD (chronic kidney disease), stage III (HCC)    mgd by PCP    History of breast cancer    left, right   Hypertension     Personal history of radiation therapy    Pneumonia 10/2012    SURGICAL HISTORY: Past Surgical History:  Procedure Laterality Date   ABDOMINAL HYSTERECTOMY     BIOPSY  04/12/2017   Procedure: BIOPSY;  Surgeon: Rogene Houston, MD;  [facility];  Service: Endoscopy;;  ascending colon ulcer biopsies   BREAST BIOPSY Left 09/22/2022   [place] LT BREAST BX W LOC DEV EA ADD LESION IMG BX SPEC US GUIDE 09/22/2022 [facility]   BREAST BIOPSY Left 09/22/2022   [place] LT BREAST BX W LOC DEV 1ST LESION IMG BX [place] US GUIDE 09/22/2022 [facility]   BREAST LUMPECTOMY  08/28/2001   left   BREAST RECONSTRUCTION Right    implant   CHOLECYSTECTOMY  2002   COLONOSCOPY     COLONOSCOPY N/A 04/12/2017   Procedure: COLONOSCOPY;  Surgeon: Rogene Houston, MD;  [facility];  Service: Endoscopy;  Laterality: N/A;  [place]  08/15/2006   right   PARTIAL HYSTERECTOMY  1985   REPLACEMENT TOTAL KNEE  2009, 2010   right-2010, left 2009   TISSUE EXPANDER PLACEMENT     TOTAL HIP ARTHROPLASTY Right 10/09/2017   Procedure: RIGHT TOTAL HIP ARTHROPLASTY ANTERIOR APPROACH;  Surgeon: Frederik Pear, MD;  Location: [place];  Service: Orthopedics;  Laterality: Right;   TOTAL HIP ARTHROPLASTY Left 09/23/2019   Procedure: Left Anterior Hip Arthroplasty;  Surgeon: Frederik Pear, MD;  Location: [facility];  Service: Orthopedics;  Laterality: Left;    SOCIAL HISTORY: Social History   Socioeconomic History   Marital status: Divorced    Spouse name: Not on file   Number of children: Not on file   Years of education: Not on file   Highest education level: Not on file  Occupational History   Not on file  Tobacco Use   Smoking status: Former    Packs/day: 0.50    Years: 5.00    Total pack years: 2.50    Types: Cigarettes    Quit date: 09/28/1983    Years since quitting: 39.1   Smokeless tobacco: Never  Vaping Use   Vaping Use: Never used  Substance and Sexual Activity   Alcohol use: No    Drug use: No   Sexual activity: Not on file  Other Topics Concern   Not on file  Social History Narrative   Not on file   Social Determinants of Health   Financial Resource Strain: Not on file  Food Insecurity: Not on file  Transportation Needs: Not on file  Physical Activity: Not on file  Stress: Not on file  Social Connections: Not on file  Intimate Partner Violence: Not on file    FAMILY HISTORY: Family History  Problem Relation Age of Onset   Arthritis Mother    Thyroid cancer Brother        2010   Colon cancer Neg Hx    Breast cancer Neg Hx     ALLERGIES:  is allergic to penicillins, oxytetracycline, tamoxifen, aromasin [exemestane], and codeine.  MEDICATIONS:  Current Outpatient Medications  Medication Sig Dispense Refill   Ascorbic Acid (VITAMIN C) 1000 MG tablet Take 1,000 mg by mouth daily with lunch.     aspirin EC 81 MG tablet Take 1 tablet (81 mg total) by mouth 2 (two) times daily. 60 tablet 0   Calcium-Vitamin D (CALTRATE 600 PLUS-VIT D PO) Take 1 tablet by mouth daily with lunch.      Cholecalciferol (D-3-5) 125 MCG (5000 UT) capsule Take 5,000 Units by mouth daily.     cholecalciferol (VITAMIN D) 1000 UNITS tablet Take 1,000 Units by mouth daily with lunch.      Garlic (GARLIQUE PO) Take 1 tablet by mouth daily with lunch.     hydrochlorothiazide (HYDRODIURIL) 12.5 MG tablet Take 12.5 mg by mouth daily.     losartan (COZAAR) 100 MG tablet Take 100 mg by mouth daily.     NON FORMULARY Balance of Nature - 3 fruits and 3 Vegetables     Omega-3 Fatty Acids (FISH OIL) 1200 MG CPDR Take by mouth.     oxyCODONE-acetaminophen (PERCOCET/ROXICET) 5-325 MG tablet Take 1 tablet by mouth every 4 (four) hours as needed for severe pain. 30 tablet 0   tiZANidine (ZANAFLEX) 2 MG tablet Take 1 tablet (2 mg total) by mouth every 6 (six) hours as needed. 60 tablet 0   TURMERIC PO Take 3 capsules by mouth daily with lunch.     vitamin B-12 (CYANOCOBALAMIN) 100 MCG tablet  Take 100 mcg by mouth daily.     No current facility-administered medications for this visit.    REVIEW OF SYSTEMS:   Constitutional: Denies fevers, chills or abnormal night sweats Eyes: Denies blurriness of vision, double vision or watery eyes Ears, nose, mouth, throat, and face: Denies mucositis or sore throat Respiratory: Denies cough, dyspnea or wheezes Cardiovascular: Denies palpitation, chest discomfort or lower extremity swelling Gastrointestinal:  Denies nausea,  heartburn or change in bowel habits Skin: Denies abnormal skin rashes Lymphatics: Denies new lymphadenopathy or easy bruising Neurological: Denies weakness.  Tingling in the feet and lower legs for many years. Behavioral/Psych: Mood is stable, no new changes  All other systems were reviewed with the patient and are negative.  PHYSICAL EXAMINATION: ECOG PERFORMANCE STATUS: 1 - Symptomatic but completely ambulatory  Vitals:   10/26/22 0817  BP: (!) 126/57  Pulse: 78  Resp: 16  Temp: 98.4 F (36.9 C)  SpO2: 97%   Filed Weights   10/26/22 0817  Weight: 178 lb 3.2 oz (80.8 kg)    GENERAL:alert, no distress and comfortable SKIN: skin color, texture, turgor are normal, no rashes or significant lesions EYES: normal, conjunctiva are pink and non-injected, sclera clear OROPHARYNX:no exudate, no erythema and lips, buccal mucosa, and tongue normal  NECK: supple, thyroid normal size, non-tender, without nodularity LYMPH:  no palpable lymphadenopathy in the cervical, axillary or inguinal LUNGS: clear to auscultation and percussion with normal breathing effort HEART: regular rate & rhythm and no murmurs and no lower extremity edema ABDOMEN:abdomen soft, non-tender and normal bowel sounds Musculoskeletal:no cyanosis of digits and no clubbing  PSYCH: alert & oriented x 3 with fluent speech NEURO: no focal motor/sensory deficits Breast: Right mastectomy site with implant is within normal limits.  Left breast lumpectomy  scar in the lower outer quadrant with contracted left breast.  No clearly palpable mass or lymphadenopathy.  LABORATORY DATA:  I have reviewed the data as listed Lab Results  Component Value Date   WBC 8.8 10/26/2022   HGB 13.3 10/26/2022   HCT 41.2 10/26/2022   MCV 92.0 10/26/2022   PLT 302 10/26/2022     Chemistry      Component Value Date/Time   NA 142 10/26/2022 0906   K 4.3 10/26/2022 0906   CL 108 10/26/2022 0906   CO2 27 10/26/2022 0906   BUN 28 (H) 10/26/2022 0906   CREATININE 1.16 (H) 10/26/2022 0906      Component Value Date/Time   CALCIUM 9.2 10/26/2022 0906   ALKPHOS 65 10/26/2022 0906   AST 17 10/26/2022 0906   ALT 18 10/26/2022 0906   BILITOT 0.5 10/26/2022 0906       RADIOGRAPHIC STUDIES: I have personally reviewed the radiological images as listed and agreed with the findings in the report. No results found.  ASSESSMENT:  1.  Multifocal HER2 positive left breast cancer: - Left breast diagnostic mammogram and ultrasound (09/19/2022): Oval mass with irregular and indistinct margins in the 2 o'clock position of the left breast 1 cm from the nipple, measuring 0.8 x 1 x 0.8 cm.  In the 2:00 location 3 cm from the nipple, mass measures 0.8 x 0.9 x 0.9 cm.  Measured together, these lesions span 2.4 cm in the 2 o'clock position.  Left axilla is negative for adenopathy. - Biopsy (09/22/2022): 1. left breast 2:00, 1 cmfn-invasive ductal carcinoma, grade 2, ER 100%, PR 95%, Ki-67 20%, HER2 2+ by IHC, HER2 FISH positive with ratio of 2.45 2.  Left breast 2:00 3 cmfn-invasive ductal carcinoma, grade 2, ER/PR 100%, Ki-67 15%, HER2 2+ by IHC, FISH negative  2.  Social/family history: - Lives at home by herself and is independent of ADLs and IADLs.  She worked as an Research officer, [employer].  Quit smoking 40 years ago. - Brother had thyroid cancer.  3.  Stage I right breast cancer: - Diagnosed on 05/15/2006, right breast 11 o'clock position -  Right  mastectomy (08/15/2006): 1.3 cm IDC, ER 83% positive, PR 84% positive, HER2 negative, Ki-67 4% - Tamoxifen followed by exemestane followed by letrozole for 10 years.  4.  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  Multifocal left breast cancer, HER2 positive in one focus: - We discussed imaging and pathology report in detail. - We reviewed NCCN guidelines which strongly recommend adjuvant chemotherapy with trastuzumab and endocrine therapy for tumors more than 1 cm. - Hence have recommended weekly paclitaxel x 12 with trastuzumab. - Initially she was reluctant to consider paclitaxel due to hair loss but agreed. - She has mild tingling in the feet and lower legs for several years which has been stable.  She is not diabetic. - She was instructed to proceed with mastectomy and lymph node sampling if feasible by Dr. Donne Hazel. - I have done baseline labs today which showed normal LFTs.  She does not have any clinical signs or symptoms to dictate staging scans. - I have recommended 2D echocardiogram. - Because of her personal history of multiple cancers, I have recommended genetic testing. - Recommend follow-up 4 weeks after surgery.   Orders Placed This Encounter  Procedures   CBC with Differential    Standing Status:   Future    Number of Occurrences:   1    Standing Expiration Date:   10/26/2023   Comprehensive metabolic panel    Standing Status:   Future    Number of Occurrences:   1    Standing Expiration Date:   10/26/2023   Protein electrophoresis, serum    Standing Status:   Future    Number of Occurrences:   1    Standing Expiration Date:   10/26/2023   Ambulatory referral to Genetics    Referral Priority:   Routine    Referral Type:   Consultation    Referral Reason:   Specialty Services Required    Number of Visits Requested:   1   ECHOCARDIOGRAM COMPLETE    Standing Status:   Future    Standing Expiration Date:   10/27/2023     Order Specific Question:   Where should this test be performed    Answer:   [facility]    Order Specific Question:   Perflutren DEFINITY (image enhancing agent) should be administered unless hypersensitivity or allergy exist    Answer:   Administer Perflutren    Order Specific Question:   Is a special reader required? (athlete or structural heart)    Answer:   No    Order Specific Question:   Does this study need to be read by the Structural team/Level 3 readers?    Answer:   No    Order Specific Question:   Reason for exam-Echo    Answer:   Chemo  Z09    Order Specific Question:   Release to patient    Answer:   Immediate    All questions were answered. The patient knows to call the clinic with any problems, questions or concerns.      Derek Jack, MD 10/26/2022 5:53 PM

## 2022-10-27 ENCOUNTER — Encounter (HOSPITAL_BASED_OUTPATIENT_CLINIC_OR_DEPARTMENT_OTHER)
Admission: RE | Admit: 2022-10-27 | Discharge: 2022-10-27 | Disposition: A | Discharge status: Home / Self Care | Payer: PPO | Source: Ambulatory Visit | Attending: General Surgery | Admitting: General Surgery

## 2022-10-27 DIAGNOSIS — I10 Essential (primary) hypertension: Secondary | ICD-10-CM | POA: Insufficient documentation

## 2022-10-27 DIAGNOSIS — C50912 Malignant neoplasm of unspecified site of left female breast: Secondary | ICD-10-CM | POA: Diagnosis not present

## 2022-10-27 DIAGNOSIS — Z01810 Encounter for preprocedural cardiovascular examination: Secondary | ICD-10-CM | POA: Insufficient documentation

## 2022-10-27 DIAGNOSIS — C50911 Malignant neoplasm of unspecified site of right female breast: Secondary | ICD-10-CM | POA: Diagnosis not present

## 2022-10-27 MED ORDER — ENSURE PRE-SURGERY PO LIQD: 296.0000 mL | Freq: Once | ORAL | Status: DC

## 2022-10-27 NOTE — Progress Notes (Signed)

## 2022-10-28 LAB — PROTEIN ELECTROPHORESIS, SERUM
A/G Ratio: 1.3 (ref 0.7–1.7)
Albumin ELP: 3.6 g/dL (ref 2.9–4.4)
Alpha-1-Globulin: 0.2 g/dL (ref 0.0–0.4)
Alpha-2-Globulin: 0.6 g/dL (ref 0.4–1.0)
Beta Globulin: 1.1 g/dL (ref 0.7–1.3)
Gamma Globulin: 0.9 g/dL (ref 0.4–1.8)
Globulin, Total: 2.8 g/dL (ref 2.2–3.9)
Total Protein ELP: 6.4 g/dL (ref 6.0–8.5)

## 2022-10-31 ENCOUNTER — Other Ambulatory Visit: Payer: Self-pay

## 2022-10-31 ENCOUNTER — Observation Stay (HOSPITAL_BASED_OUTPATIENT_CLINIC_OR_DEPARTMENT_OTHER)
Admission: RE | Admit: 2022-10-31 | Discharge: 2022-11-01 | Disposition: A | Discharge status: Home / Self Care | Payer: PPO | Attending: General Surgery | Admitting: General Surgery

## 2022-10-31 ENCOUNTER — Ambulatory Visit (HOSPITAL_BASED_OUTPATIENT_CLINIC_OR_DEPARTMENT_OTHER): Payer: PPO | Admitting: Anesthesiology

## 2022-10-31 ENCOUNTER — Encounter (HOSPITAL_BASED_OUTPATIENT_CLINIC_OR_DEPARTMENT_OTHER): Payer: Self-pay | Admitting: General Surgery

## 2022-10-31 ENCOUNTER — Encounter (HOSPITAL_BASED_OUTPATIENT_CLINIC_OR_DEPARTMENT_OTHER)
Admission: RE | Disposition: A | Discharge status: Home / Self Care | Payer: Self-pay | Source: Home / Self Care | Attending: General Surgery

## 2022-10-31 ENCOUNTER — Ambulatory Visit (HOSPITAL_COMMUNITY): Payer: PPO

## 2022-10-31 DIAGNOSIS — N641 Fat necrosis of breast: Secondary | ICD-10-CM | POA: Diagnosis not present

## 2022-10-31 DIAGNOSIS — Z9660 Presence of unspecified orthopedic joint implant: Secondary | ICD-10-CM | POA: Insufficient documentation

## 2022-10-31 DIAGNOSIS — C50912 Malignant neoplasm of unspecified site of left female breast: Secondary | ICD-10-CM

## 2022-10-31 DIAGNOSIS — Z9012 Acquired absence of left breast and nipple: Secondary | ICD-10-CM

## 2022-10-31 DIAGNOSIS — C50412 Malignant neoplasm of upper-outer quadrant of left female breast: Principal | ICD-10-CM | POA: Insufficient documentation

## 2022-10-31 DIAGNOSIS — N189 Chronic kidney disease, unspecified: Secondary | ICD-10-CM | POA: Insufficient documentation

## 2022-10-31 DIAGNOSIS — Z79899 Other long term (current) drug therapy: Secondary | ICD-10-CM | POA: Diagnosis not present

## 2022-10-31 DIAGNOSIS — Z87891 Personal history of nicotine dependence: Secondary | ICD-10-CM | POA: Diagnosis not present

## 2022-10-31 DIAGNOSIS — G8918 Other acute postprocedural pain: Secondary | ICD-10-CM | POA: Diagnosis not present

## 2022-10-31 DIAGNOSIS — I10 Essential (primary) hypertension: Secondary | ICD-10-CM

## 2022-10-31 DIAGNOSIS — Z170 Estrogen receptor positive status [ER+]: Secondary | ICD-10-CM | POA: Diagnosis not present

## 2022-10-31 HISTORY — PX: SIMPLE MASTECTOMY WITH AXILLARY SENTINEL NODE BIOPSY: SHX6098

## 2022-10-31 HISTORY — PX: PORTACATH PLACEMENT: SHX2246

## 2022-10-31 SURGERY — SIMPLE MASTECTOMY
Anesthesia: Regional | Site: Chest | Laterality: Right

## 2022-10-31 MED ORDER — LACTATED RINGERS IV SOLN: INTRAVENOUS | Status: DC

## 2022-10-31 MED ORDER — MAGTRACE LYMPHATIC TRACER
INTRAMUSCULAR | Status: DC | PRN
  Administered 2022-10-31: 2 mL via INTRAMUSCULAR

## 2022-10-31 MED ORDER — FENTANYL CITRATE (PF) 100 MCG/2ML IJ SOLN
INTRAMUSCULAR | Status: AC
  Filled 2022-10-31: qty 2

## 2022-10-31 MED ORDER — ONDANSETRON HCL 4 MG/2ML IJ SOLN
INTRAMUSCULAR | Status: AC
  Filled 2022-10-31: qty 2

## 2022-10-31 MED ORDER — OXYCODONE HCL 5 MG/5ML PO SOLN: 5.0000 mg | Freq: Once | ORAL | Status: DC | PRN

## 2022-10-31 MED ORDER — ONDANSETRON HCL 4 MG/2ML IJ SOLN: 4.0000 mg | Freq: Four times a day (QID) | INTRAMUSCULAR | Status: DC | PRN

## 2022-10-31 MED ORDER — ACETAMINOPHEN 500 MG PO TABS
1000.0000 mg | ORAL_TABLET | Freq: Four times a day (QID) | ORAL | Status: DC
  Administered 2022-10-31 – 2022-11-01 (×3): 1000 mg via ORAL
  Filled 2022-10-31 (×2): qty 2

## 2022-10-31 MED ORDER — HEPARIN SOD (PORK) LOCK FLUSH 100 UNIT/ML IV SOLN
INTRAVENOUS | Status: DC | PRN
  Administered 2022-10-31: 400 [IU] via INTRAVENOUS

## 2022-10-31 MED ORDER — PROPOFOL 500 MG/50ML IV EMUL
INTRAVENOUS | Status: DC | PRN
  Administered 2022-10-31: 150 ug/kg/min via INTRAVENOUS

## 2022-10-31 MED ORDER — LOSARTAN POTASSIUM 50 MG PO TABS
100.0000 mg | ORAL_TABLET | Freq: Every day | ORAL | Status: DC
  Filled 2022-10-31: qty 2

## 2022-10-31 MED ORDER — MIDAZOLAM HCL 2 MG/2ML IJ SOLN
INTRAMUSCULAR | Status: AC
  Filled 2022-10-31: qty 2

## 2022-10-31 MED ORDER — ACETAMINOPHEN 500 MG PO TABS
1000.0000 mg | ORAL_TABLET | Freq: Once | ORAL | Status: AC
  Administered 2022-10-31: 1000 mg via ORAL

## 2022-10-31 MED ORDER — ACETAMINOPHEN 500 MG PO TABS: 1000.0000 mg | ORAL_TABLET | ORAL | Status: DC

## 2022-10-31 MED ORDER — ONDANSETRON HCL 4 MG/2ML IJ SOLN
INTRAMUSCULAR | Status: DC | PRN
  Administered 2022-10-31: 4 mg via INTRAVENOUS

## 2022-10-31 MED ORDER — OXYCODONE HCL 5 MG PO TABS: 5.0000 mg | ORAL_TABLET | ORAL | Status: DC | PRN

## 2022-10-31 MED ORDER — MORPHINE SULFATE (PF) 4 MG/ML IV SOLN: 1.0000 mg | INTRAVENOUS | Status: DC | PRN

## 2022-10-31 MED ORDER — PROPOFOL 10 MG/ML IV BOLUS
INTRAVENOUS | Status: AC
  Filled 2022-10-31: qty 20

## 2022-10-31 MED ORDER — PROPOFOL 10 MG/ML IV BOLUS
INTRAVENOUS | Status: DC | PRN
  Administered 2022-10-31: 100 mg via INTRAVENOUS

## 2022-10-31 MED ORDER — TIZANIDINE HCL 2 MG PO TABS: 2.0000 mg | ORAL_TABLET | Freq: Four times a day (QID) | ORAL | Status: DC | PRN

## 2022-10-31 MED ORDER — CHLORHEXIDINE GLUCONATE CLOTH 2 % EX PADS: 6.0000 | MEDICATED_PAD | Freq: Once | CUTANEOUS | Status: DC

## 2022-10-31 MED ORDER — CIPROFLOXACIN IN D5W 400 MG/200ML IV SOLN
400.0000 mg | INTRAVENOUS | Status: AC
  Administered 2022-10-31: 400 mg via INTRAVENOUS

## 2022-10-31 MED ORDER — AMISULPRIDE (ANTIEMETIC) 5 MG/2ML IV SOLN: 10.0000 mg | Freq: Once | INTRAVENOUS | Status: DC | PRN

## 2022-10-31 MED ORDER — ONDANSETRON HCL 4 MG/2ML IJ SOLN: 4.0000 mg | Freq: Once | INTRAMUSCULAR | Status: DC | PRN

## 2022-10-31 MED ORDER — SODIUM CHLORIDE 0.9 % IV SOLN: INTRAVENOUS | Status: DC

## 2022-10-31 MED ORDER — HYDROMORPHONE HCL 1 MG/ML IJ SOLN: 0.2500 mg | INTRAMUSCULAR | Status: DC | PRN

## 2022-10-31 MED ORDER — FENTANYL CITRATE (PF) 100 MCG/2ML IJ SOLN
INTRAMUSCULAR | Status: DC | PRN
  Administered 2022-10-31: 25 ug via INTRAVENOUS
  Administered 2022-10-31: 50 ug via INTRAVENOUS
  Administered 2022-10-31: 25 ug via INTRAVENOUS

## 2022-10-31 MED ORDER — LIDOCAINE HCL (CARDIAC) PF 100 MG/5ML IV SOSY
PREFILLED_SYRINGE | INTRAVENOUS | Status: DC | PRN
  Administered 2022-10-31: 40 mg via INTRATRACHEAL

## 2022-10-31 MED ORDER — DEXAMETHASONE SODIUM PHOSPHATE 10 MG/ML IJ SOLN
INTRAMUSCULAR | Status: DC | PRN
  Administered 2022-10-31: 5 mg via INTRAVENOUS

## 2022-10-31 MED ORDER — ONDANSETRON 4 MG PO TBDP: 4.0000 mg | ORAL_TABLET | Freq: Four times a day (QID) | ORAL | Status: DC | PRN

## 2022-10-31 MED ORDER — FENTANYL CITRATE (PF) 100 MCG/2ML IJ SOLN
100.0000 ug | Freq: Once | INTRAMUSCULAR | Status: AC
  Administered 2022-10-31: 50 ug via INTRAVENOUS

## 2022-10-31 MED ORDER — PHENYLEPHRINE HCL (PRESSORS) 10 MG/ML IV SOLN
INTRAVENOUS | Status: DC | PRN
  Administered 2022-10-31: 80 ug via INTRAVENOUS
  Administered 2022-10-31: 160 ug via INTRAVENOUS
  Administered 2022-10-31: 80 ug via INTRAVENOUS

## 2022-10-31 MED ORDER — HEPARIN (PORCINE) IN NACL 2-0.9 UNITS/ML
INTRAMUSCULAR | Status: AC | PRN
  Administered 2022-10-31: 1 via INTRAVENOUS

## 2022-10-31 MED ORDER — BUPIVACAINE HCL (PF) 0.25 % IJ SOLN
INTRAMUSCULAR | Status: DC | PRN
  Administered 2022-10-31: 8 mL

## 2022-10-31 MED ORDER — OXYCODONE HCL 5 MG PO TABS: 5.0000 mg | ORAL_TABLET | Freq: Once | ORAL | Status: DC | PRN

## 2022-10-31 MED ORDER — CIPROFLOXACIN IN D5W 400 MG/200ML IV SOLN
INTRAVENOUS | Status: AC
  Filled 2022-10-31: qty 200

## 2022-10-31 MED ORDER — ACETAMINOPHEN 500 MG PO TABS
ORAL_TABLET | ORAL | Status: AC
  Filled 2022-10-31: qty 2

## 2022-10-31 SURGICAL SUPPLY — 56 items
APPLIER CLIP 11 MED OPEN (CLIP) ×2
BAG DECANTER FOR FLEXI CONT (MISCELLANEOUS) ×2 IMPLANT
BIOPATCH RED 1 DISK 7.0 (GAUZE/BANDAGES/DRESSINGS) IMPLANT
BLADE SURG 10 STRL SS (BLADE) ×2 IMPLANT
BLADE SURG 11 STRL SS (BLADE) ×2 IMPLANT
BLADE SURG 15 STRL LF DISP TIS (BLADE) ×2 IMPLANT
BLADE SURG 15 STRL SS (BLADE) ×2
CANISTER SUCT 1200ML W/VALVE (MISCELLANEOUS) ×2 IMPLANT
CHLORAPREP W/TINT 26 (MISCELLANEOUS) ×2 IMPLANT
CLIP APPLIE 11 MED OPEN (CLIP) IMPLANT
COVER BACK TABLE 60X90IN (DRAPES) ×2 IMPLANT
COVER MAYO STAND STRL (DRAPES) ×2 IMPLANT
COVER PROBE CYLINDRICAL 5X96 (MISCELLANEOUS) ×2 IMPLANT
DERMABOND ADVANCED .7 DNX12 (GAUZE/BANDAGES/DRESSINGS) ×2 IMPLANT
DRAIN CHANNEL 19F RND (DRAIN) ×2 IMPLANT
DRAPE C-ARM 42X72 X-RAY (DRAPES) ×2 IMPLANT
DRAPE TOP ARMCOVERS (MISCELLANEOUS) ×2 IMPLANT
DRAPE U-SHAPE 76X120 STRL (DRAPES) ×2 IMPLANT
DRAPE UTILITY XL STRL (DRAPES) ×2 IMPLANT
DRSG TEGADERM 4X4.75 (GAUZE/BANDAGES/DRESSINGS) IMPLANT
ELECT COATED BLADE 2.86 ST (ELECTRODE) ×2 IMPLANT
ELECT REM PT RETURN 9FT ADLT (ELECTROSURGICAL) ×2
ELECTRODE REM PT RTRN 9FT ADLT (ELECTROSURGICAL) ×2 IMPLANT
EVACUATOR SILICONE 100CC (DRAIN) ×2 IMPLANT
GAUZE 4X4 16PLY ~~LOC~~+RFID DBL (SPONGE) ×2 IMPLANT
GAUZE PAD ABD 8X10 STRL (GAUZE/BANDAGES/DRESSINGS) ×2 IMPLANT
GAUZE SPONGE 4X4 12PLY STRL LF (GAUZE/BANDAGES/DRESSINGS) ×2 IMPLANT
GLOVE BIO SURGEON STRL SZ7 (GLOVE) ×2 IMPLANT
GLOVE BIOGEL PI IND STRL 7.5 (GLOVE) ×2 IMPLANT
GOWN STRL REUS W/ TWL LRG LVL3 (GOWN DISPOSABLE) ×6 IMPLANT
GOWN STRL REUS W/TWL LRG LVL3 (GOWN DISPOSABLE) ×6
HEMOSTAT ARISTA ABSORB 3G PWDR (HEMOSTASIS) IMPLANT
KIT PORT POWER 8FR ISP CVUE (Port) IMPLANT
LIGHT WAVEGUIDE WIDE FLAT (MISCELLANEOUS) ×2 IMPLANT
NDL HYPO 25X1 1.5 SAFETY (NEEDLE) ×2 IMPLANT
NEEDLE HYPO 25X1 1.5 SAFETY (NEEDLE) ×2 IMPLANT
NS IRRIG 1000ML POUR BTL (IV SOLUTION) ×2 IMPLANT
PACK BASIN DAY SURGERY FS (CUSTOM PROCEDURE TRAY) ×2 IMPLANT
PENCIL SMOKE EVACUATOR (MISCELLANEOUS) ×2 IMPLANT
PIN SAFETY STERILE (MISCELLANEOUS) ×2 IMPLANT
SLEEVE SCD COMPRESS KNEE MED (STOCKING) ×2 IMPLANT
SPONGE T-LAP 18X18 ~~LOC~~+RFID (SPONGE) ×2 IMPLANT
STRIP CLOSURE SKIN 1/2X4 (GAUZE/BANDAGES/DRESSINGS) ×2 IMPLANT
SUT ETHILON 2 0 FS 18 (SUTURE) ×2 IMPLANT
SUT MNCRL AB 4-0 PS2 18 (SUTURE) ×2 IMPLANT
SUT PROLENE 2 0 SH DA (SUTURE) ×2 IMPLANT
SUT SILK 2 0 SH (SUTURE) IMPLANT
SUT VIC AB 2-0 SH 27 (SUTURE) ×2
SUT VIC AB 2-0 SH 27XBRD (SUTURE) ×4 IMPLANT
SUT VICRYL 3-0 CR8 SH (SUTURE) ×2 IMPLANT
SYR 5ML LUER SLIP (SYRINGE) ×2 IMPLANT
SYR CONTROL 10ML LL (SYRINGE) ×2 IMPLANT
TOWEL GREEN STERILE FF (TOWEL DISPOSABLE) ×4 IMPLANT
TRACER MAGTRACE VIAL (MISCELLANEOUS) IMPLANT
TUBE CONNECTING 20X1/4 (TUBING) ×2 IMPLANT
YANKAUER SUCT BULB TIP NO VENT (SUCTIONS) ×2 IMPLANT

## 2022-10-31 NOTE — Progress Notes (Signed)
Assisted Dr. Finucane with left, pectoralis, ultrasound guided block. Side rails up, monitors on throughout procedure. See vital signs in flow sheet. Tolerated Procedure well. 

## 2022-10-31 NOTE — Interval H&P Note (Signed)
History and Physical Interval Note:  10/31/2022 12:22 PM  Haley Peterson  has presented today for surgery, with the diagnosis of LEFT BREAST CANCER.  The various methods of treatment have been discussed with the patient and family. After consideration of risks, benefits and other options for treatment, the patient has consented to  Procedure(s) with comments: LEFT MASTECTOMY WITH AXILLARY SENTINEL LYMPH NODE BIOPSY (Left) - 90 MIN ROOM 2 INSERTION PORT-A-CATH (Right) as a surgical intervention.  The patient's history has been reviewed, patient examined, no change in status, stable for surgery.  I have reviewed the patient's chart and labs.  Questions were answered to the patient's satisfaction.     [name]

## 2022-10-31 NOTE — H&P (Signed)
76 year old female who has a prior history in 2002 of a lumpectomy sentinel node biopsy followed by radiotherapy. She has a history in 2007 of a right mastectomy with an implant. I do not have really any other information about these tumors. She has done well. She is otherwise fairly healthy. She had a screening detected left breast mass. This ends up being 2 areas that measure 1 x 0.8 x 0.8 cm and 0.9 x 0.9 x 0.8 cm. The 2 lesions together measure about 2 and half centimeters. Her axillary ultrasound is negative. Biopsy was done of these that shows a grade 2 invasive ductal carcinoma that it was 100% ER positive, 95% PR positive, HER2 positive, and Ki-67 is 20%. She comes in to discuss her options.  Review of Systems: A complete review of systems was obtained from the patient. I have reviewed this information and discussed as appropriate with the patient. See HPI as well for other ROS.  Review of Systems HENT: Positive for tinnitus. All other systems reviewed and are negative.   Medical History: Past Medical History: Diagnosis Date Arthritis Chronic kidney disease History of cancer  Past Surgical History: Procedure Laterality Date right breast mastectomy Right 2007 DEEP AXILLARY SENTINEL NODE BIOPSY / EXCISION Left HYSTERECTOMY JOINT REPLACEMENT Bilateral MASTECTOMY PARTIAL / LUMPECTOMY Left   Allergies Allergen Reactions Penicillins Unknown and Other (See Comments) Lost sight for about 30 minutes  Affected vision for an hour after taken/administered  "LOSS OF VISION"  Has patient had a PCN reaction causing immediate rash, facial/tongue/throat swelling, SOB or lightheadedness with hypotension: No Has patient had a PCN reaction causing severe rash involving mucus membranes or skin necrosis: No Has patient had a PCN reaction that required hospitalization: No Has patient had a PCN reaction occurring within the last 10 years: No If all of the above answers are "NO", then may  proceed with Cephalosporin use. Oxytetracycline Hcl Unknown Fever blisters  Current Outpatient Medications on File Prior to Visit Medication Sig Dispense Refill losartan (COZAAR) 100 MG tablet Take 100 mg by mouth once daily  Family History Problem Relation Age of Onset Coronary Artery Disease (Blocked arteries around heart) Brother   Social History  Tobacco Use Smoking Status Former Types: Cigarettes Quit date: 1980 Years since quitting: 43.9 Smokeless Tobacco Never Marital status: Divorced Tobacco Use Smoking status: Former Types: Cigarettes Quit date: 1980 Years since quitting: 43.9 Smokeless tobacco: Never Substance and Sexual Activity Alcohol use: Never Drug use: Never  Objective:  Vitals: 10/13/22 1032 BP: 132/72 Pulse: 92 Temp: 36.6 C (97.8 F) SpO2: 98% Weight: 81.3 kg (179 lb 3.2 oz) Height: 162.6 cm (_0 )  Body mass index is 30.76 kg/m.  Physical Exam Constitutional: Appearance: Normal appearance. Chest: Breasts: Right: No mass. Left: No inverted nipple, mass or nipple discharge. Comments S/p right mastectomy with implant Lymphadenopathy: Upper Body: Right upper body: No supraclavicular or axillary adenopathy. Left upper body: No supraclavicular or axillary adenopathy. Neurological: Mental Status: She is alert.  Assessment and Plan:  Malignant neoplasm of upper-outer quadrant of left breast in female, estrogen receptor positive  Left mastectomy, attempt sn biopsy, port placement  I discussed with her today that due to her breast size, the size of these lesions, and the HER2 positivity I think that the best option is to do a mastectomy on the left side. We have decided not to proceed with any reconstruction at the same time as this would be problematic given the prior radiation. I am going to refer her to  see oncology at [facility] to be able to discuss what therapy to be given adjuvantly. We discussed the HER2 positivity in detail today.  I do not think there is any role of proceeding with primary systemic therapy for her. She will receive adjuvant therapy though and Dr Raliegh Ip has requested a port.   We discussed the staging and pathophysiology of breast cancer. We discussed all of the different options for treatment for breast cancer including surgery, chemotherapy, radiation therapy, Herceptin, and antiestrogen therapy.  We discussed a sentinel lymph node biopsy as she does not appear to having lymph node involvement right now. I will attempt this but will abort if nothing identified given prior surgery. We discussed the performance of that with injection of tracer. We discussed that there is a chance of having a positive node with a sentinel lymph node biopsy and we will await the permanent pathology to make any other first further decisions in terms of her treatment. We discussed up to a 5% risk lifetime of chronic shoulder pain as well as lymphedema associated with a sentinel lymph node biopsy.  We discussed the options for treatment of the breast cancer which included lumpectomy versus a mastectomy. I dont think lumpectomy is good option.  We discussed the risks of operation including bleeding, infection, possible reoperation. She understands her further therapy will be based on what her stages at the time of her operation.

## 2022-10-31 NOTE — Op Note (Signed)
Preoperative diagnosis: Left breast cancer, clinical stage I Postoperative diagnosis: Same as above Procedure: 1.  Left mastectomy 2.  Injection of mag trace for sentinel lymph node identification 3.  Right internal jugular port placement Surgical Dr. Serita Grammes Anesthesia: General with a pectoral block Estimated blood loss: Minimal Complications: None Drains: 19 French Blake drain to left side Specimen: 1.  Left breast tissue marked short superior, long lateral Sponge needle count was correct at completion Disposition recovery stable  Indications:76 year old female who has a prior history in 2002 of a lumpectomy sentinel node biopsy followed by radiotherapy. She has a history in 2007 of a right mastectomy with an implant. She had a screening detected left breast mass. This ends up being 2 areas that measure 1 x 0.8 x 0.8 cm and 0.9 x 0.9 x 0.8 cm. The 2 lesions together measure about 2 and half centimeters. Her axillary ultrasound is negative. Biopsy was done of these that shows a grade 2 invasive ductal carcinoma that it was 100% ER positive, 95% PR positive, HER2 positive, and Ki-67 is 20%.  We discussed a mastectomy and attempted sentinel lymph node biopsy.  She is also due to undergo chemotherapy as well as HER2 therapy and we discussed port placement.  Procedure: After informed consent was obtained the patient was taken to the operating room.  She underwent a pectoral block.  She was given antibiotics.  SCDs were in place.  She was then taken to the operating room and placed under general anesthesia.  She was prepped and draped in the standard sterile surgical fashion.  Surgical timeout was then performed.  I first injected 2 cc of mag trace in the subareolar position on the left side.  I then did the port.  I was able to identify the internal jugular vein on the right side with the ultrasound.  I made a small nick in the skin.  I accessed the vein under ultrasound with a needle.  I  then placed a wire.  The wire was in the vein both by the ultrasound and fluoroscopy confirmed to be in the correct position.  I then infiltrated the Marcaine in the right chest wall below the clavicle.  I then made an incision.  I tunneled the line between the 2 sites.  I then placed the dilator over the wire and remove the wire and the dilator leaving the sheath in place.  I then put a line through the sheath and pulled the tip of the line back so it was at the cavoatrial junction.  The sheath was removed.  I then attached the line to the port and placed this in the pocket.  This was sutured down with a 2-0 Prolene suture.  This aspirated and flushed easily.  This was packed with heparin.  I then closed this with 3-0 Vicryl, 4-0 Monocryl, and glue.  I took 1 final image that showed the port was all in good position without any kinking and the tip was near the cavoatrial junction and it is ready for use.  I then turned my attention to the mastectomy.  I made elliptical incision and encompass the nipple areola.  I carried flaps to the clavicle, parasternal area, inframammary fold, the latissimus laterally.  I obliterated the inframammary fold for closure.  I then remove the breast tissue and the fascia from the muscle.  This was done with some difficulty due to her prior therapy.  I then remarked this and passed this off the table  as above.  I then interrogated her axilla.  There really was no activity.  There were many nodes or any tissue there.  Due to the fact that she had already had a procedure on this side and my inability to locate any sentinel nodes as well as the nodes were normal preoperatively I elected to stop there.  I then obtained hemostasis.  I placed a 71 [place] Blake drain and secured this with a 2-0 nylon suture.  I then closed this with 3-0 Vicryl for Monocryl.  Glue and Steri-Strips applied.  She tolerated as well as extubated transferred recovery stable.

## 2022-10-31 NOTE — Anesthesia Procedure Notes (Signed)
Procedure Name: LMA Insertion Date/Time: 10/31/2022 12:52 PM  Performed by: Glory Buff, CRNAPre-anesthesia Checklist: Patient identified, Emergency Drugs available, Suction available and Patient being monitored Patient Re-evaluated:Patient Re-evaluated prior to induction Oxygen Delivery Method: Circle system utilized Preoxygenation: Pre-oxygenation with 100% oxygen Induction Type: IV induction LMA: LMA inserted LMA Size: 4.0 Number of attempts: 1 Placement Confirmation: positive ETCO2 Tube secured with: Tape Dental Injury: Teeth and Oropharynx as per pre-operative assessment

## 2022-10-31 NOTE — Anesthesia Procedure Notes (Signed)
Anesthesia Regional Block: Pectoralis block   Pre-Anesthetic Checklist: , timeout performed,  Correct Patient, Correct Site, Correct Laterality,  Correct Procedure, Correct Position, site marked,  Risks and benefits discussed,  Surgical consent,  Pre-op evaluation,  At surgeon's request and post-op pain management  Laterality: Left  Prep: Maximum Sterile Barrier Precautions used, chloraprep       Needles:  Injection technique: Single-shot  Needle Type: Echogenic Stimulator Needle     Needle Length: 9cm  Needle Gauge: 22     Additional Needles:   Procedures:,,,, ultrasound used (permanent image in chart),,    Narrative:  Start time: 10/31/2022 12:00 PM End time: 10/31/2022 12:10 PM Injection made incrementally with aspirations every 5 mL.  Performed by: Personally  Anesthesiologist: Pervis Hocking, DO  Additional Notes: Monitors applied. No increased pain on injection. No increased resistance to injection. Injection made in 5cc increments. Good needle visualization. Patient tolerated procedure well.

## 2022-10-31 NOTE — Anesthesia Postprocedure Evaluation (Signed)
Anesthesia Post Note  Patient: Haley Peterson  Procedure(s) Performed: LEFT MASTECTOMY (Left: Breast) INSERTION PORT-A-CATH (Right: Chest)     Patient location during evaluation: PACU Anesthesia Type: Regional and General Level of consciousness: awake and alert, oriented and patient cooperative Pain management: pain level controlled Vital Signs Assessment: post-procedure vital signs reviewed and stable Respiratory status: spontaneous breathing, nonlabored ventilation and respiratory function stable Cardiovascular status: blood pressure returned to baseline and stable Postop Assessment: no apparent nausea or vomiting Anesthetic complications: no   No notable events documented.  Last Vitals:  Vitals:   10/31/22 1445 10/31/22 1500  BP: (!) 141/70 139/68  Pulse: 73 66  Resp: 17 11  Temp:    SpO2: 96% 96%    Last Pain:  Vitals:   10/31/22 1500  TempSrc:   PainSc: [place]

## 2022-10-31 NOTE — Discharge Instructions (Signed)
[facility], [place] [phone]  MASTECTOMY: POST OP INSTRUCTIONS Take 400 mg of ibuprofen every 8 hours or 650 mg tylenol every 6 hours for next 72 hours then as needed. Use ice several times daily also. Always review your discharge instruction sheet given to you by the facility where your surgery was performed.  A prescription for pain medication may be given to you upon discharge.  Take your pain medication as prescribed, if needed.  If narcotic pain medicine is not needed, then you may take acetaminophen (Tylenol), naprosyn (Alleve) or ibuprofen (Advil) as needed. Take your usually prescribed medications unless otherwise directed. If you need a refill on your pain medication, please contact your pharmacy.  They will contact our office to request authorization.  Prescriptions will not be filled after 5pm or on week-ends. You should follow a light diet the first 24 hours after surgery.  Resume your normal diet the day after surgery. Most patients will experience some swelling and bruising on the chest and underarm.  Ice packs will help.  Swelling and bruising can take several days to resolve. Wear the binder or [place] bra for 72 hours day and night. After night please wear during the day.  It is common to experience some constipation if taking pain medication after surgery.  Increasing fluid intake and taking a stool softener (such as Colace) will usually help or prevent this problem from occurring.  A mild laxative (Milk of Magnesia or Miralax) should be taken according to package instructions if there are no bowel movements after 48 hours. There is glue and steristrips on your incision. They will come off in the next few weeks.  You may take a shower 48 hours after surgery.  Any sutures will be removed at an office visit DRAINS:  If you have drains in place, it is important to keep a list of the amount of drainage produced each day in your drains.  Before leaving the hospital, you  should be instructed on drain care.  Call our office if you have any questions about your drains. I will remove your drains when they put out less than 30 cc or ml for 2 consecutive days. ACTIVITIES:  You may resume regular (light) daily activities beginning the next day--such as daily self-care, walking, climbing stairs--gradually increasing activities as tolerated.  You may have sexual intercourse when it is comfortable.  Refrain from any heavy lifting or straining until approved by your doctor. You may drive when you are no longer taking prescription pain medication, you can comfortably wear a seatbelt, and you can safely maneuver your car and apply brakes. RETURN TO WORK:  __________________________________________________________ Haley Peterson should see your doctor in the office for a follow-up appointment approximately 3-5 days after your surgery.  Your doctor's nurse will typically make your follow-up appointment when she calls you with your pathology report.  Expect your pathology report 3-4business days after surgery. OTHER INSTRUCTIONS: ______________________________________________________________________________________________ ____________________________________________________________________________________________ WHEN TO CALL YOUR DR Brittne Kawasaki: Fever over 101.0 Nausea and/or vomiting Extreme swelling or bruising Continued bleeding from incision. Increased pain, redness, or drainage from the incision. The clinic staff is available to answer your questions during regular business hours.  Please don't hesitate to call and ask to speak to one of the nurses for clinical concerns.  If you have a medical emergency, go to the nearest emergency room or call 911.  A surgeon from [facility] is always on call at the hospital. [address], [address], [place], [place]  [address] ? [address] [address], [place], [place]   [address] [phone] ? [phone] ? FAX [phone] Web site:  [email or web address]   About my Jackson-Pratt Bulb Drain  What is a Jackson-Pratt bulb? A Jackson-Pratt is a soft, round device used to collect drainage. It is connected to a long, thin drainage catheter, which is held in place by one or two small stiches near your surgical incision site. When the bulb is squeezed, it forms a vacuum, forcing the drainage to empty into the bulb.  Emptying the Jackson-Pratt bulb- To empty the bulb: 1. Release the plug on the top of the bulb. 2. Pour the bulb's contents into a measuring container which your nurse will provide. 3. Record the time emptied and amount of drainage. Empty the drain(s) as often as your     doctor or nurse recommends.  Date                  Time                    Amount (Drain 1)                 Amount (Drain 2)  _____________________________________________________________________  _____________________________________________________________________  _____________________________________________________________________  _____________________________________________________________________  _____________________________________________________________________  _____________________________________________________________________  _____________________________________________________________________  _____________________________________________________________________  Squeezing the Jackson-Pratt Bulb- To squeeze the bulb: 1. Make sure the plug at the top of the bulb is open. 2. Squeeze the bulb tightly in your fist. You will hear air squeezing from the bulb. 3. Replace the plug while the bulb is squeezed. 4. Use a safety pin to attach the bulb to your clothing. This will keep the catheter from     pulling at the bulb insertion site.  When to call your doctor- Call your doctor if: Drain site becomes red, swollen or hot. You have a fever greater than 101 degrees F. There is oozing at the drain site. Drain  falls out (apply a guaze bandage over the drain hole and secure it with tape). Drainage increases daily not related to activity patterns. (You will usually have more drainage when you are active than when you are resting.) Drainage has a bad odor.   Information for Discharge Teaching: EXPAREL (bupivacaine liposome injectable suspension)   Your surgeon or anesthesiologist gave you EXPAREL(bupivacaine) to help control your pain after surgery.  EXPAREL is a local anesthetic that provides pain relief by numbing the tissue around the surgical site. EXPAREL is designed to release pain medication over time and can control pain for up to 72 hours. Depending on how you respond to EXPAREL, you may require less pain medication during your recovery.  Possible side effects: Temporary loss of sensation or ability to move in the area where bupivacaine was injected. Nausea, vomiting, constipation Rarely, numbness and tingling in your mouth or lips, lightheadedness, or anxiety may occur. Call your doctor right away if you think you may be experiencing any of these sensations, or if you have other questions regarding possible side effects.  Follow all other discharge instructions given to you by your surgeon or nurse. Eat a healthy diet and drink plenty of water or other fluids.  If you return to the hospital for any reason within 96 hours following the administration of EXPAREL, it is important for health care providers to know that you have received this anesthetic. A teal colored band has been placed on your arm with the date, time and amount of EXPAREL you have received in order to alert  and inform your health care providers. Please leave this armband in place for the full 96 hours following administration, and then you may remove the band.

## 2022-10-31 NOTE — Anesthesia Preprocedure Evaluation (Addendum)
Anesthesia Evaluation  Patient identified by MRN, date of birth, ID band Patient awake    Reviewed: Allergy & Precautions, NPO status , Patient's Chart, lab work & pertinent test results  Airway Mallampati: II  TM Distance: >3 FB Neck ROM: Full    Dental no notable dental hx.    Pulmonary former smoker   Pulmonary exam normal breath sounds clear to auscultation       Cardiovascular hypertension, Pt. on medications Normal cardiovascular exam Rhythm:Regular Rate:Normal     Neuro/Psych negative neurological ROS  negative psych ROS   GI/Hepatic negative GI ROS, Neg liver ROS,,,  Endo/Other  negative endocrine ROS    Renal/GU Renal InsufficiencyRenal diseaseCKD, cr 1.16  negative genitourinary   Musculoskeletal  (+) Arthritis , Osteoarthritis,    Abdominal   Peds  Hematology negative hematology ROS (+)   Anesthesia Other Findings   Reproductive/Obstetrics negative OB ROS                             Anesthesia Physical Anesthesia Plan  ASA: 2  Anesthesia Plan: General and Regional   Post-op Pain Management: Regional block* and Tylenol PO (pre-op)*   Induction: Intravenous  PONV Risk Score and Plan: 3 and Ondansetron, Dexamethasone, Midazolam and Treatment may vary due to age or medical condition  Airway Management Planned: LMA  Additional Equipment: None  Intra-op Plan:   Post-operative Plan: Extubation in OR  Informed Consent: I have reviewed the patients History and Physical, chart, labs and discussed the procedure including the risks, benefits and alternatives for the proposed anesthesia with the patient or authorized representative who has indicated his/her understanding and acceptance.     Dental advisory given  Plan Discussed with: CRNA  Anesthesia Plan Comments:        Anesthesia Quick Evaluation

## 2022-10-31 NOTE — Transfer of Care (Signed)
Immediate Anesthesia Transfer of Care Note  Patient: Haley Peterson  Procedure(s) Performed: LEFT MASTECTOMY (Left: Breast) INSERTION PORT-A-CATH (Right: Chest)  Patient Location: PACU  Anesthesia Type:GA combined with regional for post-op pain  Level of Consciousness: drowsy and patient cooperative  Airway & Oxygen Therapy: Patient Spontanous Breathing and Patient connected to face mask oxygen  Post-op Assessment: Report given to RN and Post -op Vital signs reviewed and stable  Post vital signs: Reviewed and stable  Last Vitals:  Vitals Value Taken Time  BP    Temp    Pulse 78 10/31/22 1429  Resp 11 10/31/22 1429  SpO2 100 % 10/31/22 1429  Vitals shown include unvalidated device data.  Last Pain:  Vitals:   10/31/22 1135  TempSrc: Oral  PainSc: 0-No pain      Patients Stated Pain Goal: 7 (35/43/01 4840)  Complications: No notable events documented.

## 2022-11-01 DIAGNOSIS — C50412 Malignant neoplasm of upper-outer quadrant of left female breast: Secondary | ICD-10-CM | POA: Diagnosis not present

## 2022-11-01 NOTE — Discharge Summary (Signed)
Physician Discharge Summary  Patient ID: Haley Peterson MRN: [id number] DOB/AGE: 1945/12/05 76 y.o.  Admit date: 10/31/2022 Discharge date: 11/01/2022  Admission Diagnoses: Left breast cancer, her 2 pos  Discharge Diagnoses:  Principal Problem:   S/P left mastectomy   Discharged Condition: good  Hospital Course: 83 yof s/p left mastectomy and port placement. Doing well and will be discharged home.   Consults:  none  Significant Diagnostic Studies: none  Treatments: surgery: left mastectomy, right IJ port  Discharge Exam: Blood pressure (!) 142/62, pulse 70, temperature 98.4 F (36.9 C), resp. rate 16, height '5\' 4"'$  (1.626 m), weight 80.8 kg, SpO2 99 %. Port in place, no hematoma, drain serosang as expected  Disposition:    Allergies as of 11/01/2022       Reactions   Penicillins Other (See Comments)   "LOSS OF VISION" Has patient had a PCN reaction causing immediate rash, facial/tongue/throat swelling, SOB or lightheadedness with hypotension: No Has patient had a PCN reaction causing severe rash involving mucus membranes or skin necrosis: No Has patient had a PCN reaction that required hospitalization: No Has patient had a PCN reaction occurring within the last 10 years: No If all of the above answers are "NO", then may proceed with Cephalosporin use.   Oxytetracycline Other (See Comments)   Fever blisters   Tamoxifen Other (See Comments)   "GENERAL ILLNESS WITH LIGHTHEADNESS"   Aromasin [exemestane] Other (See Comments)   UNSPECIFIED REACTION  Pt is unsure of what reaction was...   Codeine Other (See Comments)   LIGHTHEADED        Medication List     TAKE these medications    CALTRATE 600 PLUS-VIT D PO Take 1 tablet by mouth daily with lunch.   D-3-5 125 MCG (5000 UT) capsule Generic drug: Cholecalciferol Take 5,000 Units by mouth daily.   cholecalciferol 1000 units tablet Commonly known as: VITAMIN D Take 1,000 Units by mouth daily with lunch.    Fish Oil 1200 MG Cpdr Take by mouth.   GARLIQUE PO Take 1 tablet by mouth daily with lunch.   hydrochlorothiazide 12.5 MG tablet Commonly known as: HYDRODIURIL Take 12.5 mg by mouth daily.   losartan 100 MG tablet Commonly known as: COZAAR Take 100 mg by mouth daily.   NON FORMULARY Balance of Nature - 3 fruits and 3 Vegetables   oxyCODONE-acetaminophen 5-325 MG tablet Commonly known as: PERCOCET/ROXICET Take 1 tablet by mouth every 4 (four) hours as needed for severe pain.   tiZANidine 2 MG tablet Commonly known as: ZANAFLEX Take 1 tablet (2 mg total) by mouth every 6 (six) hours as needed.   TURMERIC PO Take 3 capsules by mouth daily with lunch.   vitamin B-12 100 MCG tablet Commonly known as: CYANOCOBALAMIN Take 100 mcg by mouth daily.   vitamin C 1000 MG tablet Take 1,000 mg by mouth daily with lunch.        Follow-up Information     [name], MD Follow up in 3 week(s).   Specialty: General Surgery Contact information: [address] [place] [address] [phone]                 Signed: [name] 11/01/2022, 7:30 AM

## 2022-11-02 ENCOUNTER — Encounter (HOSPITAL_BASED_OUTPATIENT_CLINIC_OR_DEPARTMENT_OTHER): Payer: Self-pay | Admitting: General Surgery

## 2022-11-02 LAB — SURGICAL PATHOLOGY

## 2022-11-21 ENCOUNTER — Ambulatory Visit (HOSPITAL_COMMUNITY)
Admission: RE | Admit: 2022-11-21 | Discharge: 2022-11-21 | Disposition: A | Discharge status: Home / Self Care | Payer: PPO | Source: Ambulatory Visit | Attending: Hematology | Admitting: Hematology

## 2022-11-21 DIAGNOSIS — Z170 Estrogen receptor positive status [ER+]: Secondary | ICD-10-CM | POA: Insufficient documentation

## 2022-11-21 DIAGNOSIS — I10 Essential (primary) hypertension: Secondary | ICD-10-CM | POA: Insufficient documentation

## 2022-11-21 DIAGNOSIS — Z01818 Encounter for other preprocedural examination: Secondary | ICD-10-CM | POA: Insufficient documentation

## 2022-11-21 DIAGNOSIS — Z0189 Encounter for other specified special examinations: Secondary | ICD-10-CM

## 2022-11-21 DIAGNOSIS — C50412 Malignant neoplasm of upper-outer quadrant of left female breast: Secondary | ICD-10-CM | POA: Insufficient documentation

## 2022-11-21 LAB — ECHOCARDIOGRAM COMPLETE
AR max vel: 2.14 cm2
AV Area VTI: 2.39 cm2
AV Area mean vel: 2.25 cm2
AV Mean grad: 8 mmHg
AV Peak grad: 15.4 mmHg
Ao pk vel: 1.96 m/s
Area-P 1/2: 3.42 cm2
Calc EF: 64.6 %
Est EF: >75
S' Lateral: 2.2 cm
Single Plane A2C EF: 60.3 %
Single Plane A4C EF: 69.3 %

## 2022-11-21 NOTE — Progress Notes (Signed)
*  PRELIMINARY RESULTS* Echocardiogram 2D Echocardiogram has been performed.  Haley Peterson 11/21/2022, 10:33 AM

## 2022-11-28 DIAGNOSIS — C50911 Malignant neoplasm of unspecified site of right female breast: Secondary | ICD-10-CM | POA: Diagnosis not present

## 2022-11-28 DIAGNOSIS — C50912 Malignant neoplasm of unspecified site of left female breast: Secondary | ICD-10-CM | POA: Diagnosis not present

## 2022-11-29 ENCOUNTER — Encounter: Payer: Self-pay | Admitting: Rehabilitation

## 2022-11-29 ENCOUNTER — Ambulatory Visit: Payer: PPO | Attending: General Surgery | Admitting: Rehabilitation

## 2022-11-29 DIAGNOSIS — Z9189 Other specified personal risk factors, not elsewhere classified: Secondary | ICD-10-CM | POA: Diagnosis not present

## 2022-11-29 DIAGNOSIS — C50919 Malignant neoplasm of unspecified site of unspecified female breast: Secondary | ICD-10-CM | POA: Insufficient documentation

## 2022-11-29 DIAGNOSIS — R293 Abnormal posture: Secondary | ICD-10-CM | POA: Insufficient documentation

## 2022-11-29 NOTE — Therapy (Signed)
OUTPATIENT PHYSICAL THERAPY BREAST CANCER POST OP FOLLOW UP   Patient Name: Haley Peterson MRN: [id number] DOB:1946-07-14, 77 y.o., female Today's Date: 11/29/2022  END OF SESSION:  PT End of Session - 11/29/22 1054     Visit Number 2    Number of Visits 2    Date for PT Re-Evaluation 11/28/21    PT Start Time 1100    PT Stop Time 1130    PT Time Calculation (min) 30 min    Activity Tolerance Patient tolerated treatment well    Behavior During Therapy WFL for tasks assessed/performed             Past Medical History:  Diagnosis Date   Arthritis    Bone spur    left heel   Breast cancer ([place])    bilateral   Cancer ([place])    Bilateral Breast cancer   Cataract    right eye    CKD (chronic kidney disease), stage III ([place])    mgd by PCP    History of breast cancer    left, right   Hypertension    Personal history of radiation therapy    Pneumonia 10/2012   Past Surgical History:  Procedure Laterality Date   ABDOMINAL HYSTERECTOMY     BIOPSY  04/12/2017   Procedure: BIOPSY;  Surgeon: Rogene Houston, MD;  [facility];  Service: Endoscopy;;  ascending colon ulcer biopsies   BREAST BIOPSY Left 09/22/2022   [place] LT BREAST BX W LOC DEV EA ADD LESION IMG BX SPEC US GUIDE 09/22/2022 [facility]   BREAST BIOPSY Left 09/22/2022   [place] LT BREAST BX W LOC DEV 1ST LESION IMG BX [place] US GUIDE 09/22/2022 [facility]   BREAST LUMPECTOMY  08/28/2001   left   BREAST RECONSTRUCTION Right    implant   CHOLECYSTECTOMY  2002   COLONOSCOPY     COLONOSCOPY N/A 04/12/2017   Procedure: COLONOSCOPY;  Surgeon: Rogene Houston, MD;  [facility];  Service: Endoscopy;  Laterality: N/A;  830   MASTECTOMY  08/15/2006   right   PARTIAL HYSTERECTOMY  1985   PORTACATH PLACEMENT Right 10/31/2022   Procedure: INSERTION PORT-A-CATH;  Surgeon: [name], MD;  Location: [facility];  Service: General;  Laterality: Right;   REPLACEMENT TOTAL KNEE   2009, 2010   right-2010, left 2009   SIMPLE MASTECTOMY WITH AXILLARY SENTINEL NODE BIOPSY Left 10/31/2022   Procedure: LEFT MASTECTOMY;  Surgeon: [name], MD;  Location: [facility];  Service: General;  Laterality: Left;  90 MIN ROOM 2   TISSUE EXPANDER PLACEMENT     TOTAL HIP ARTHROPLASTY Right 10/09/2017   Procedure: RIGHT TOTAL HIP ARTHROPLASTY ANTERIOR APPROACH;  Surgeon: Frederik Pear, MD;  Location: [facility];  Service: Orthopedics;  Laterality: Right;   TOTAL HIP ARTHROPLASTY Left 09/23/2019   Procedure: Left Anterior Hip Arthroplasty;  Surgeon: Frederik Pear, MD;  Location: [facility];  Service: Orthopedics;  Laterality: Left;   Patient Active Problem List   Diagnosis Date Noted   S/P left mastectomy 10/31/2022   Breast cancer of upper-outer quadrant of left female breast ([place]) 10/25/2022   S/P hip replacement, left 09/23/2019   Osteoarthritis of left hip 09/20/2019   Primary osteoarthritis of right hip 10/09/2017   Osteoarthritis of right hip 10/04/2017   Special screening for malignant neoplasms, colon 12/30/2016   Breast cancer ([place]) 06/27/2011   Hx Breast cancer, Right, multifocal IDC, receptor +, Her2 -  06/07/2006    Class: Stage 1   Hx Breast cancer (DCIS), Right, Stage 0,  08/08/2001    PCP: Dr. Allyn Kenner   REFERRING PROVIDER: Dr. Donne Hazel   REFERRING DIAG:  Diagnosis C50.919 (ICD-10-CM) - Malignant neoplasm of female breast, unspecified estrogen receptor status, unspecified laterality, unspecified site of breast ([place])  THERAPY DIAG:  Abnormal posture  At risk for lymphedema  Malignant neoplasm of female breast, unspecified estrogen receptor status, unspecified laterality, unspecified site of breast [facility])  Rationale for Evaluation and Treatment: Rehabilitation  ONSET DATE: 10/13/22  SUBJECTIVE:                                                                                                                                                                                            SUBJECTIVE STATEMENT: I am feeling really good.  I have been doing my exercises. Denies swelling and incision is closed.    PERTINENT HISTORY:  Patient was diagnosed on 10/13/22 with left breast cancer in 2 locations measuring around 2.5cm in total. Negative axillary [place]. grade 2 IDC. It is ER/PR/HER2 positive with a Ki67 of 20%.  Prior hx of Lt lumpectomy with radiation and SLNB with 1 negative lymph node removed in 2002 and Rt mastectomy with implant in 2007. Will be having left mastectomy without reconstruction due to prior radiation. SLNB will be attempted but aborted if nothing identified due to prior surgery.  Sx notes report LN were unable to be mapped but had 1 removed in tissue.  I have to do chemotherapy.    PATIENT GOALS:  Reassess how my recovery is going related to arm function, pain, and swelling.  PAIN:  Are you having pain? Yes: NPRS scale: 1/10 Pain location: where the drain was Pain description: achy, mild Aggravating factors: nothing Relieving factors: just there  PRECAUTIONS: Recent Surgery, left UE Lymphedema risk  ACTIVITY LEVEL / LEISURE: I have been walking and back to normal.    OBJECTIVE:   PATIENT SURVEYS:  QUICK DASH: 0%  OBSERVATIONS: Well healed incision with 2 steristrips still present.    POSTURE:  Rounded shoulders    LYMPHEDEMA ASSESSMENT:   UPPER EXTREMITY AROM/PROM:                          A/PROM LEFT   eval 11/29/22  Shoulder extension     Shoulder flexion 145 150  Shoulder abduction 165 170  Shoulder internal rotation 70   Shoulder external rotation 100 95                          (  Blank rows = not tested)   CERVICAL AROM: All within normal limits:    UPPER EXTREMITY STRENGTH: WNL   LYMPHEDEMA ASSESSMENTS:    LANDMARK RIGHT   eval  10 cm proximal to olecranon process 29.3  Olecranon process 25.7  10 cm proximal to ulnar styloid process 21.2  Just proximal to ulnar styloid process 15.3   Across hand at thumb web space 18.2  At base of 2nd digit 5.7  (Blank rows = not tested)   LANDMARK LEFT   eval 11/29/22  10 cm proximal to olecranon process 30 29.5  Olecranon process 26 25.8  10 cm proximal to ulnar styloid process 21 21  Just proximal to ulnar styloid process 15.4 15.5  Across hand at thumb web space 18 18  At base of 2nd digit 6.0 6.0  (Blank rows = not tested)  5x sit to stand:  13.85 seconds : below cutoff for increased risk of falls.   Grip strength:  Rt: 60pounds, Lt: 55 pounds   PATIENT EDUCATION:  Education details: post op care per below, exercise and walking through chemo,  Person educated: Patient Education method: Explanation and Handouts Education comprehension: verbalized understanding  HOME EXERCISE PROGRAM: Reviewed previously given post op HEP. Showed sit to stand and overhead press motions  ASSESSMENT:  CLINICAL IMPRESSION: Pt has returned to PLOF and AROM from baseline and is doing very well.  She will be doing chemotherapy next so we discussed TE and self care for this time period.  Pt knows when to return to PT and will continue with SOZO screens. .   Pt will benefit from skilled therapeutic intervention to improve on the following deficits: Decreased knowledge of precautions, impaired UE functional use, pain, decreased ROM, postural dysfunction.   PT treatment/interventions: ADL/Self care home management, Therapeutic exercises, Patient/Family education, Self Care, Manual therapy, and Re-evaluation   GOALS: Goals reviewed with patient? Yes  LONG TERM GOALS:  (STG=LTG)  GOALS Name Target Date  Goal status  1 Pt will demonstrate she has regained full shoulder ROM and function post operatively compared to baselines.  Baseline: 11/29/22 MET                    PLAN:  PT FREQUENCY/DURATION: SOZO only  PLAN FOR NEXT SESSION: SOZO   [facility]  [address], [address]  [place] [address]  [phone]  After Breast Cancer Class It is recommended you attend the ABC class to be educated on lymphedema risk reduction. This class is free of charge and lasts for 1 hour. It is a 1-time class. You will need to download the Webex app either on your phone or computer. We will send you a link the night before or the morning of the class. You should be able to click on that link to join the class. This is not a confidential class. You don't have to turn your camera on, but other participants may be able to see your email address.  Scar massage You can begin gentle scar massage to you incision sites. Gently place one hand on the incision and move the skin (without sliding on the skin) in various directions. Do this for a few minutes and then you can gently massage either coconut oil or vitamin E cream into the scars.  Compression garment You should continue wearing your compression bra until you feel like you no longer have swelling.  Home exercise Program Continue doing the exercises you were given  until you feel like you can do them without feeling any tightness at the end.   Walking Program Studies show that 30 minutes of walking per day (fast enough to elevate your heart rate) can significantly reduce the risk of a cancer recurrence. If you can't walk due to other medical reasons, we encourage you to find another activity you could do (like a stationary bike or water exercise).  Posture After breast cancer surgery, people frequently sit with rounded shoulders posture because it puts their incisions on slack and feels better. If you sit like this and scar tissue forms in that position, you can become very tight and have pain sitting or standing with good posture. Try to be aware of your posture and sit and stand up tall to heal properly.  Follow up PT: It is recommended you return every 3 months for the first 3 years following surgery to be assessed on the SOZO machine for an L-Dex score. This  helps prevent clinically significant lymphedema in 95% of patients. These follow up screens are 10 minute appointments that you are not billed for.  Stark Bray, PT 11/29/2022, 11:46 AM

## 2022-11-30 ENCOUNTER — Ambulatory Visit: Payer: PPO | Admitting: Hematology

## 2022-11-30 ENCOUNTER — Inpatient Hospital Stay: Payer: PPO | Attending: Hematology | Admitting: Hematology

## 2022-11-30 VITALS — BP 174/62 | HR 97 | Temp 98.4°F | Resp 18 | Wt 184.9 lb

## 2022-11-30 DIAGNOSIS — Z170 Estrogen receptor positive status [ER+]: Secondary | ICD-10-CM | POA: Diagnosis not present

## 2022-11-30 DIAGNOSIS — G629 Polyneuropathy, unspecified: Secondary | ICD-10-CM | POA: Diagnosis not present

## 2022-11-30 DIAGNOSIS — C50412 Malignant neoplasm of upper-outer quadrant of left female breast: Secondary | ICD-10-CM | POA: Diagnosis not present

## 2022-11-30 DIAGNOSIS — Z9013 Acquired absence of bilateral breasts and nipples: Secondary | ICD-10-CM | POA: Insufficient documentation

## 2022-11-30 DIAGNOSIS — Z87891 Personal history of nicotine dependence: Secondary | ICD-10-CM | POA: Insufficient documentation

## 2022-11-30 DIAGNOSIS — Z79899 Other long term (current) drug therapy: Secondary | ICD-10-CM | POA: Diagnosis not present

## 2022-11-30 DIAGNOSIS — Z923 Personal history of irradiation: Secondary | ICD-10-CM | POA: Insufficient documentation

## 2022-11-30 DIAGNOSIS — M8580 Other specified disorders of bone density and structure, unspecified site: Secondary | ICD-10-CM | POA: Diagnosis not present

## 2022-11-30 DIAGNOSIS — Z86000 Personal history of in-situ neoplasm of breast: Secondary | ICD-10-CM | POA: Diagnosis not present

## 2022-11-30 DIAGNOSIS — Z95828 Presence of other vascular implants and grafts: Secondary | ICD-10-CM

## 2022-11-30 DIAGNOSIS — Z853 Personal history of malignant neoplasm of breast: Secondary | ICD-10-CM | POA: Insufficient documentation

## 2022-11-30 NOTE — Patient Instructions (Addendum)
[facility]  Discharge Instructions  You were seen and examined today by Dr. Delton Coombes.  Dr. Delton Coombes has discussed the results of your final surgical pathology report which revealed the cancer that we knew about but there was also a pre-cancer (DCIS). There was no lymph node involvement. This is good news.  As previously discussed, Dr. Delton Coombes has recommended twelve doses of weekly chemotherapy in addition to an anti-HER2 (Herceptin) therapy given once every 3 weeks for a total of a year.  Patient's who take chemotherapy and Herceptin decrease the risk of cancer recurrence by 60 to 70% chance.  The treatment regimen will include Taxol (chemotherapy) given once weekly for 12 weeks as well as Herceptin given every 3 weeks for a total of a year. These will start on the same day and will be given here in the [facility] through your Port-A-Cath.  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 Delton Coombes know. Dr. Delton Coombes will start you at a lower dose of Taxol.  The most common side effects of this treatment include fatigue, hair loss, occasionally allergic reaction during infusion, diarrhea and decrease in blood counts making you more susceptible to infections.  Follow-up as scheduled.  Thank you for choosing [facility] to provide your oncology and hematology care.   To afford each patient quality time with our provider, please arrive at least 15 minutes before your scheduled appointment time. You may need to reschedule your appointment if you arrive late (10 or more minutes). Arriving late affects you and other patients whose appointments are after yours.  Also, if you miss three or more appointments without notifying the office, you may be dismissed from the clinic at the provider's discretion.    Again, thank you for choosing [facility].  Our hope is that these requests will  decrease the amount of time that you wait before being seen by our physicians.   If you have a lab appointment with the [facility] please come in thru the Main Entrance and check in at the main information desk.           _____________________________________________________________  Should you have questions after your visit to [facility], please contact our office at [phone] and follow the prompts.  Our office hours are 8:00 a.m. to 4:30 p.m. Monday - Thursday and 8:00 a.m. to 2:30 p.m. Friday.  Please note that voicemails left after 4:00 p.m. may not be returned until the following business day.  We are closed weekends and all major holidays.  You do have access to a nurse 24-7, just call the main number to the clinic [phone] and do not press any options, hold on the line and a nurse will answer the phone.    For prescription refill requests, have your pharmacy contact our office and allow 72 hours.    Masks are optional in the cancer centers. If you would like for your care team to wear a mask while they are taking care of you, please let them know. You may have one support person who is at least 77 years old accompany you for your appointments.

## 2022-11-30 NOTE — Progress Notes (Signed)
START ON PATHWAY REGIMEN - Breast     Cycle 1: A cycle is 7 days:     Trastuzumab-xxxx      Paclitaxel    Cycles 2 through 12: A cycle is every 7 days:     Trastuzumab-xxxx      Paclitaxel    Cycles 13 through 25: A cycle is every 21 days:     Trastuzumab-xxxx   **Always confirm dose/schedule in your pharmacy ordering system**  Patient Characteristics: Postoperative without Neoadjuvant Therapy (Pathologic Staging), Invasive Disease, Adjuvant Therapy, HER2 Positive, ER Positive, Node Negative, pT2, pN0, Tumor Size ?  3 cm Therapeutic Status: Postoperative without Neoadjuvant Therapy (Pathologic Staging) AJCC Grade: G2 AJCC N Category: pN0 AJCC M Category: cM0 ER Status: Positive (+) AJCC 8 Stage Grouping: IA HER2 Status: Positive (+) Oncotype Dx Recurrence Score: Not Appropriate AJCC T Category: pT2 PR Status: Positive (+) Intent of Therapy: Curative Intent, Discussed with Patient 

## 2022-11-30 NOTE — Progress Notes (Signed)
[facility] [address] [place] [address]   CLINIC:  Medical Oncology/Hematology  PCP:  Celene Squibb, MD [place] [address] [phone]   REASON FOR VISIT:  Follow-up for stage Ib HER2 positive left breast cancer  PRIOR THERAPY: 1.  Left mastectomy and SLNB by Dr. Donne Hazel on 10/31/2022  NGS Results: Not applicable  CURRENT THERAPY: Trastuzumab and Herceptin  BRIEF ONCOLOGIC HISTORY:  Oncology History   No history exists.    CANCER STAGING:  Cancer Staging  Breast cancer of upper-outer quadrant of left female breast [facility]) Staging form: Breast, AJCC 8th Edition - Clinical stage from 10/25/2022: Stage IB (cT2, cN0, cM0, G2, ER+, PR+, HER2+) - Unsigned    INTERVAL HISTORY:  Ms. Oppenheimer 77 y.o. female seen for follow-up of her left breast cancer.  She had left mastectomy and SLNB and port placement by Dr. Donne Hazel on 10/31/2022.  She is healing well and reports some tightness when she lifts her left shoulder above her head level.  She had some tingling in the lower legs and feet for many years without any pains or balance problems.    REVIEW OF SYSTEMS:  Review of Systems  Neurological:  Positive for numbness.  All other systems reviewed and are negative.    PAST MEDICAL/SURGICAL HISTORY:  Past Medical History:  Diagnosis Date   Arthritis    Bone spur    left heel   Breast cancer ([place])    bilateral   Cancer ([place])    Bilateral Breast cancer   Cataract    right eye    CKD (chronic kidney disease), stage III (HCC)    mgd by PCP    History of breast cancer    left, right   Hypertension    Personal history of radiation therapy    Pneumonia 10/2012   Past Surgical History:  Procedure Laterality Date   ABDOMINAL HYSTERECTOMY     BIOPSY  04/12/2017   Procedure: BIOPSY;  Surgeon: Rogene Houston, MD;  [facility];  Service: Endoscopy;;  ascending colon ulcer biopsies   BREAST BIOPSY Left 09/22/2022    [place] LT BREAST BX W LOC DEV EA ADD LESION IMG BX SPEC US GUIDE 09/22/2022 [facility]   BREAST BIOPSY Left 09/22/2022   [place] LT BREAST BX W LOC DEV 1ST LESION IMG BX [place] US GUIDE 09/22/2022 [facility]   BREAST LUMPECTOMY  08/28/2001   left   BREAST RECONSTRUCTION Right    implant   CHOLECYSTECTOMY  2002   COLONOSCOPY     COLONOSCOPY N/A 04/12/2017   Procedure: COLONOSCOPY;  Surgeon: Rogene Houston, MD;  [facility];  Service: Endoscopy;  Laterality: N/A;  830   MASTECTOMY  08/15/2006   right   PARTIAL HYSTERECTOMY  1985   PORTACATH PLACEMENT Right 10/31/2022   Procedure: INSERTION PORT-A-CATH;  Surgeon: [name], MD;  Location: [place];  Service: General;  Laterality: Right;   REPLACEMENT TOTAL KNEE  2009, 2010   right-2010, left 2009   SIMPLE MASTECTOMY WITH AXILLARY SENTINEL NODE BIOPSY Left 10/31/2022   Procedure: LEFT MASTECTOMY;  Surgeon: [name], MD;  Location: [facility];  Service: General;  Laterality: Left;  90 MIN ROOM 2   TISSUE EXPANDER PLACEMENT     TOTAL HIP ARTHROPLASTY Right 10/09/2017   Procedure: RIGHT TOTAL HIP ARTHROPLASTY ANTERIOR APPROACH;  Surgeon: Frederik Pear, MD;  Location: [facility];  Service: Orthopedics;  Laterality: Right;   TOTAL HIP ARTHROPLASTY Left 09/23/2019   Procedure: Left Anterior Hip Arthroplasty;  Surgeon: Frederik Pear, MD;  Location: [facility];  Service: Orthopedics;  Laterality: Left;     SOCIAL HISTORY:  Social History   Socioeconomic History   Marital status: Divorced    Spouse name: Not on file   Number of children: Not on file   Years of education: Not on file   Highest education level: Not on file  Occupational History   Not on file  Tobacco Use   Smoking status: Former    Packs/day: 0.50    Years: 5.00    Total pack years: 2.50    Types: Cigarettes    Quit date: 09/28/1983    Years since quitting: 39.2   Smokeless tobacco: Never  Vaping Use   Vaping  Use: Never used  Substance and Sexual Activity   Alcohol use: No   Drug use: No   Sexual activity: Not on file  Other Topics Concern   Not on file  Social History Narrative   Not on file   Social Determinants of Health   Financial Resource Strain: Not on file  Food Insecurity: Not on file  Transportation Needs: Not on file  Physical Activity: Not on file  Stress: Not on file  Social Connections: Not on file  Intimate Partner Violence: Not on file    FAMILY HISTORY:  Family History  Problem Relation Age of Onset   Arthritis Mother    Thyroid cancer Brother        2010   Colon cancer Neg Hx    Breast cancer Neg Hx     CURRENT MEDICATIONS:  Outpatient Encounter Medications as of 11/30/2022  Medication Sig Note   Ascorbic Acid (VITAMIN C) 1000 MG tablet Take 1,000 mg by mouth daily with lunch.    Calcium-Vitamin D (CALTRATE 600 PLUS-VIT D PO) Take 1 tablet by mouth daily with lunch.     Cholecalciferol (D-3-5) 125 MCG (5000 UT) capsule Take 5,000 Units by mouth daily.    cholecalciferol (VITAMIN D) 1000 UNITS tablet Take 1,000 Units by mouth daily with lunch.     Garlic (GARLIQUE PO) Take 1 tablet by mouth daily with lunch.    hydrochlorothiazide (HYDRODIURIL) 12.5 MG tablet Take 12.5 mg by mouth daily. 10/24/2022: Patient states she is not currently taking   losartan (COZAAR) 100 MG tablet Take 100 mg by mouth daily.    NON FORMULARY Balance of Nature - 3 fruits and 3 Vegetables    Omega-3 Fatty Acids (FISH OIL) 1200 MG CPDR Take by mouth.    TURMERIC PO Take 3 capsules by mouth daily with lunch.    vitamin B-12 (CYANOCOBALAMIN) 100 MCG tablet Take 100 mcg by mouth daily.    [DISCONTINUED] oxyCODONE-acetaminophen (PERCOCET/ROXICET) 5-325 MG tablet Take 1 tablet by mouth every 4 (four) hours as needed for severe pain.    [DISCONTINUED] tiZANidine (ZANAFLEX) 2 MG tablet Take 1 tablet (2 mg total) by mouth every 6 (six) hours as needed.    No facility-administered encounter  medications on file as of 11/30/2022.    ALLERGIES:  Allergies  Allergen Reactions   Penicillins Other (See Comments)    "LOSS OF VISION"  Has patient had a PCN reaction causing immediate rash, facial/tongue/throat swelling, SOB or lightheadedness with hypotension: No Has patient had a PCN reaction causing severe rash involving mucus membranes or skin necrosis: No Has patient had a PCN reaction that required hospitalization: No Has patient  had a PCN reaction occurring within the last 10 years: No If all of the above answers are "NO", then may proceed with Cephalosporin use.    Oxytetracycline Other (See Comments)    Fever blisters   Tamoxifen Other (See Comments)    "GENERAL ILLNESS WITH LIGHTHEADNESS"    Aromasin [Exemestane] Other (See Comments)    UNSPECIFIED REACTION  Pt is unsure of what reaction was...   Codeine Other (See Comments)    LIGHTHEADED     PHYSICAL EXAM:  ECOG Performance status: 1  Vitals:   11/30/22 1446  BP: (!) 174/62  Pulse: 97  Resp: 18  Temp: 98.4 F (36.9 C)  SpO2: 98%   Filed Weights   11/30/22 1442 11/30/22 1446  Weight: 184 lb 14.4 oz (83.9 kg) 184 lb 14.4 oz (83.9 kg)   Physical Exam Vitals reviewed.  Constitutional:      Appearance: Normal appearance.  Cardiovascular:     Rate and Rhythm: Normal rate and regular rhythm.     Heart sounds: Normal heart sounds.  Pulmonary:     Effort: Pulmonary effort is normal.     Breath sounds: Normal breath sounds.  Neurological:     Mental Status: She is alert.  Psychiatric:        Mood and Affect: Mood normal.        Behavior: Behavior normal.    Left mastectomy site is well-healed.  Right chest wall port is also well-healed.  LABORATORY DATA:  I have reviewed the labs as listed.  CBC    Component Value Date/Time   WBC 8.8 10/26/2022 0906   RBC 4.48 10/26/2022 0906   HGB 13.3 10/26/2022 0906   HCT 41.2 10/26/2022 0906   PLT 302 10/26/2022 0906   MCV 92.0 10/26/2022 0906   MCH  29.7 10/26/2022 0906   MCHC 32.3 10/26/2022 0906   RDW 12.8 10/26/2022 0906   LYMPHSABS 1.7 10/26/2022 0906   MONOABS 0.8 10/26/2022 0906   EOSABS 0.3 10/26/2022 0906   BASOSABS 0.1 10/26/2022 0906      Latest Ref Rng & Units 10/26/2022    9:06 AM 09/24/2019    2:33 AM 09/18/2019   11:57 AM  CMP  Glucose 70 - 99 mg/dL 102  136  92   BUN 8 - 23 mg/dL '28  18  18   '$ Creatinine 0.44 - 1.00 mg/dL 1.16  1.48  1.35   Sodium 135 - 145 mmol/L 142  131  132   Potassium 3.5 - 5.1 mmol/L 4.3  3.9  3.9   Chloride 98 - 111 mmol/L 108  102  99   CO2 22 - 32 mmol/L '27  21  27   '$ Calcium 8.9 - 10.3 mg/dL 9.2  7.7  8.9   Total Protein 6.5 - 8.1 g/dL 6.9     Total Bilirubin 0.3 - 1.2 mg/dL 0.5     Alkaline Phos 38 - 126 U/L 65     AST 15 - 41 U/L 17     ALT 0 - 44 U/L 18       DIAGNOSTIC IMAGING:  I have independently reviewed the scans and discussed with the patient.  ASSESSMENT:  1.  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  In the 2:00 location 3  cm from the nipple, mass measures 0.8 x 0.9 x 0.9 cm.  Measured together, these lesions span 2.4 cm in the 2 o'clock position.  Left axilla is negative for adenopathy. - Biopsy (09/22/2022): 1. left breast 2:00, 1 cmfn-invasive ductal carcinoma, grade 2, ER 100%, PR 95%, Ki-67 20%, HER2 2+ by IHC, HER2 FISH positive with ratio of 2.45 2.  Left breast 2:00 3 cmfn-invasive ductal carcinoma, grade 2, ER/PR 100%, Ki-67 15%, HER2 2+ by IHC, FISH negative - Left mastectomy, SLNB by Dr. Donne Hazel on 10/31/2022 - Pathology: 2.5 x 2.1 x 1.2 cm invasive moderately differentiated adenocarcinoma, grade 2, margins free, extensive lymphatic invasion.  0/1 lymph node positive.  Associated focal intermediate grade DCIS.   2.  Social/family history: - Lives  at home by herself and is independent of ADLs and IADLs.  She worked as an Research officer, [employer].  Quit smoking 40 years ago. - Brother had thyroid cancer.   3.  Stage I right breast cancer: - Diagnosed on 05/15/2006, right breast 11 o'clock position - Right mastectomy (08/15/2006): 1.3 cm IDC, ER 83% positive, PR 84% positive, HER2 negative, Ki-67 4% - Tamoxifen followed by exemestane followed by letrozole for 10 years.   4.  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  Osteopenia: - Last bone density on 02/03/2021: T-score -1.9. - We will plan to obtain DEXA scan prior to initiation of AI.    PLAN:  1.  Stage Ia (T2 N0) HER2 positive left breast cancer: - We have reviewed pathology report in detail. - I have recommended adjuvant chemotherapy with weekly paclitaxel x 12 and Herceptin for a total of 1 year. - We discussed the regimen of chemotherapy and Herceptin in detail.  We also discussed side effects. - We will schedule her to start during the week of 12/12/2022. - She is already scheduled for genetic testing based on personal history of multiple cancers.  2.  High risk drug monitoring: - Echocardiogram (11/21/2022): LVEF> 75%. - Will monitor echocardiogram every 3 months while she is on Herceptin.  3.  Peripheral neuropathy: - She has baseline tingling in the lower legs and feet for many years without any pain or balance problems.  Will closely monitor while she is on paclitaxel.      Orders placed this encounter:  Orders Placed This Encounter  Procedures   Ambulatory Referral to [facility] Nutrition   Ambulatory referral to Social Work      Derek Jack, [place] [phone]

## 2022-12-01 ENCOUNTER — Telehealth: Payer: Self-pay | Admitting: Dietician

## 2022-12-01 ENCOUNTER — Ambulatory Visit: Payer: PPO | Admitting: Dietician

## 2022-12-01 NOTE — Progress Notes (Signed)
Nutrition Assessment   Reason for Assessment: Patient request   ASSESSMENT: 77 year old breast cancer. S/p left mastectomy with SLNB on 12/18. She is planning adjuvant chemotherapy with paclitaxel + herceptin. Patient is under the care of Dr. Delton Coombes.  Past medical history includes right breast cancer s/p mastectomy and radiation, CKD3, HTN, cataract, arthritis.  Spoke with patient via telephone. She reports good appetite, states it is almost too good at times. She has a sweet tooth, but is mindful about intake. Patient eating 3 balanced meals and snacks. Per recall breakfast is a bowl of cereal with 2% milk (shredded wheat with cinnamon toast crunch) or egg whites on english muffin. Patient has vegetable/tomato soup with wheat thins for lunch. Snacks on peanuts and fresh fruits. Patient eats variety balanced dinners (Chicken, ground beef, fried flounder/cod, sweet potato, butter beans, corn, rutabaga, collard, cabbage) Patient is drinking ~60 ounces of water. She reports walking 50-60 minutes everyday over the last year. Patient endorses intentional wt loss ~40 lbs in the last year. Patient has questions about what to eat during/after treatment.    Nutrition Focused Physical Exam: unable to complete (telephone visit)   Medications: B12, Caltrate 600, Vit D, Cozaar   Labs: 12/13 - BUN 28, Cr 1.16   Anthropometrics:   Height: 5'4" Weight: 184 lb 14.4 oz  UBW: 210 lb (per pt ~1 yr ago - intentional wt loss) BMI: 31.74   NUTRITION DIAGNOSIS: Food and nutrition related knowledge deficit related to cancer as evidenced by pt request for nutrition information     INTERVENTION:  Educated on importance of adequate intake of calorie and protein energy to maintain weights/strength during treatment Discussed sugar and cancer, educated on glucose as energy source for all cells - will mail handout Encouraged balanced meals and snacks Continue activity as able  Will mail fact sheets, snack  ideas, contact information    MONITORING, EVALUATION, GOAL: Patient will tolerate adequate calories and protein to minimize weight loss during treatment   Next Visit: To be scheduled as needed

## 2022-12-01 NOTE — Telephone Encounter (Signed)
Patient scheduled for nutrition appointment via telephone. Unable to reach patient at time of call. Left message with request for return call. Contact information given.

## 2022-12-02 ENCOUNTER — Other Ambulatory Visit: Payer: Self-pay

## 2022-12-04 ENCOUNTER — Other Ambulatory Visit: Payer: Self-pay

## 2022-12-05 ENCOUNTER — Other Ambulatory Visit: Payer: Self-pay

## 2022-12-05 NOTE — Progress Notes (Signed)
Pharmacist Chemotherapy Monitoring - Initial Assessment    Anticipated start date: 12/13/22   The following has been reviewed per standard work regarding the patient's treatment regimen: The patient's diagnosis, treatment plan and drug doses, and organ/hematologic function Lab orders and baseline tests specific to treatment regimen  The treatment plan start date, drug sequencing, and pre-medications Prior authorization status  Patient's documented medication list, including drug-drug interaction screen and prescriptions for anti-emetics and supportive care specific to the treatment regimen The drug concentrations, fluid compatibility, administration routes, and timing of the medications to be used The patient's access for treatment and lifetime cumulative dose history, if applicable  The patient's medication allergies and previous infusion related reactions, if applicable   Changes made to treatment plan:  N/A  Follow up needed:  N/A   Wynona Neat, [facility], 12/05/2022  12:31 PM

## 2022-12-06 ENCOUNTER — Encounter: Payer: Self-pay | Admitting: Hematology

## 2022-12-06 ENCOUNTER — Inpatient Hospital Stay: Payer: PPO

## 2022-12-06 DIAGNOSIS — C50912 Malignant neoplasm of unspecified site of left female breast: Secondary | ICD-10-CM | POA: Diagnosis not present

## 2022-12-06 DIAGNOSIS — C50412 Malignant neoplasm of upper-outer quadrant of left female breast: Secondary | ICD-10-CM

## 2022-12-06 DIAGNOSIS — Z95828 Presence of other vascular implants and grafts: Secondary | ICD-10-CM

## 2022-12-06 DIAGNOSIS — C50911 Malignant neoplasm of unspecified site of right female breast: Secondary | ICD-10-CM | POA: Diagnosis not present

## 2022-12-06 HISTORY — DX: Presence of other vascular implants and grafts: Z95.828

## 2022-12-06 MED ORDER — LIDOCAINE-PRILOCAINE 2.5-2.5 % EX CREA: TOPICAL_CREAM | CUTANEOUS | 3 refills | Status: AC

## 2022-12-06 MED ORDER — PROCHLORPERAZINE MALEATE 10 MG PO TABS: 10.0000 mg | ORAL_TABLET | Freq: Four times a day (QID) | ORAL | 3 refills | Status: AC | PRN

## 2022-12-06 NOTE — Progress Notes (Signed)

## 2022-12-06 NOTE — Patient Instructions (Addendum)
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 You will come to the clinic weekly to receive treatments with a chemotherapy drug and a monoclonal antibody. The chemotherapy drug is called Taxol. The monoclonal antibody is called Herceptin. You will come weekly for 12 weeks to receive both drugs. After 12 weeks, the Taxol will drop off from your treatment and you will come every 3 weeks for a total of one year to receive Herceptin only. The intent of treatment is cure. You will see the doctor regularly throughout treatment.  We will obtain blood work from you prior to every treatment and monitor your results to make sure it is safe to give your treatment. The doctor monitors your response to treatment by the way you are feeling, your blood work, and by obtaining scans periodically. There will be wait times while you are here for treatment.  It will take about 30 minutes to 1 hour for your lab work to result. Then there will be wait times while pharmacy mixes your medications.     Medications you will receive in the clinic prior to your chemotherapy medications:   Aloxi:  ALOXI is used in adults to help prevent the nausea and vomiting that happens with certain chemotherapy drugs.  Aloxi is a long acting medication, and will remain in your system for about 2 days.    Dexamethasone:  This is a steroid given prior to chemotherapy to help prevent allergic reactions; it may also help prevent and control nausea and diarrhea.    Pepcid:  This medication is a histamine blocker that helps prevent and allergic reaction to your chemotherapy.    Benadryl:  This is a histamine blocker (different from the Pepcid) that helps prevent allergic/infusion reactions to your chemotherapy. This medication may cause dizziness/drowsiness.    Tylenol:  This is to help prevent infusion reactions to the Herceptin (fever/chills)    Paclitaxel  (Taxol)  About This Drug Paclitaxel is a drug used to treat cancer. It is given in the vein (IV).  This will take 1 hour to infuse.  This first infusion will take longer to infuse because it is increased slowly to monitor for reactions.  The nurse will be in the room with you for the first 15 minutes of the first infusion.  Possible Side Effects   Hair loss. Hair loss is often temporary, although with certain medicine, hair loss can sometimes be permanent. Hair loss may happen suddenly or gradually. If you lose hair, you may lose it from your head, face, armpits, pubic area, chest, and/or legs. You may also notice your hair getting thin.   Swelling of your legs, ankles and/or feet (edema)   Flushing   Nausea and throwing up (vomiting)   Loose bowel movements (diarrhea)   Bone marrow depression. This is a decrease in the number of white blood cells, red blood cells, and platelets. This may raise your risk of infection, make you tired and weak (fatigue), and raise your risk of bleeding.   Effects on the nerves are called peripheral neuropathy. You may feel numbness, tingling, or pain in your hands and feet. It may be hard for you to button your clothes, open jars, or walk as usual. The effect on the nerves may get worse with more doses of the drug. These effects get better in some people after the drug is stopped but it does not get better in all people.  Changes in your liver function   Bone, joint and muscle pain   Abnormal EKG   Allergic reaction: Allergic reactions, including anaphylaxis are rare but may happen in some patients. Signs of allergic reaction to this drug may be swelling of the face, feeling like your tongue or throat are swelling, trouble breathing, rash, itching, fever, chills, feeling dizzy, and/or feeling that your heart is beating in a fast or not normal way. If this happens, do not take another dose of this drug. You should get urgent medical treatment.    Infection   Changes in your kidney function.  Note: Each of the side effects above was reported in 20% or greater of patients treated with paclitaxel. Not all possible side effects are included above.  Warnings and Precautions   Severe allergic reactions   Severe bone marrow depression  Treating Side Effects   To help with hair loss, wash with a mild shampoo and avoid washing your hair every day.   Avoid rubbing your scalp, instead, pat your hair or scalp dry   Avoid coloring your hair   Limit your use of hair spray, electric curlers, blow dryers, and curling irons.   If you are interested in getting a wig, talk to your nurse. You can also call the [facility] at 800-ACS-2345 to find out information about the "Look Good, Feel Better" program close to where you live. It is a free program where women getting chemotherapy can learn about wigs, turbans and scarves as well as makeup techniques and skin and nail care.   Ask your doctor or nurse about medicines that are available to help stop or lessen diarrhea and/or nausea.   To help with nausea and vomiting, eat small, frequent meals instead of three large meals a day. Choose foods and drinks that are at room temperature. Ask your nurse or doctor about other helpful tips and medicine that is available to help or stop lessen these symptoms.   If you get diarrhea, eat low-fiber foods that are high in protein and calories and avoid foods that can irritate your digestive tracts or lead to cramping. Ask your nurse or doctor about medicine that can lessen or stop your diarrhea.   Mouth care is very important. Your mouth care should consist of routine, gentle cleaning of your teeth or dentures and rinsing your mouth with a mixture of 1/2 teaspoon of salt in 8 ounces of water or  teaspoon of baking soda in 8 ounces of water. This should be done at least after each meal and at bedtime.   If you have mouth sores, avoid mouthwash that  has alcohol. Also avoid alcohol and smoking because they can bother your mouth and throat.   Drink plenty of fluids (a minimum of eight glasses per day is recommended).   Take your temperature as your doctor or nurse tells you, and whenever you feel like you may have a fever.   Talk to your doctor or nurse about precautions you can take to avoid infections and bleeding.   Be careful when cooking, walking, and handling sharp objects and hot liquids.  Food and Drug Interactions   There are no known interactions of paclitaxel with food.   This drug may interact with other medicines. Tell your doctor and pharmacist about all the medicines and dietary supplements (vitamins, minerals, herbs and others) that you are taking at this time.   The safety and use of dietary supplements and alternative diets are often not  known. Using these might affect your cancer or interfere with your treatment. Until more is known, you should not use dietary supplements or alternative diets without your cancer doctor's help.  When to Call the Doctor  Call your doctor or nurse if you have any of the following symptoms and/or any new or unusual symptoms:   Fever of 100.5 F (38 C) or above   Chills   Redness, pain, warmth, or swelling at the IV site during the infusion   Signs of allergic reaction: swelling of the face, feeling like your tongue or throat are swelling, trouble breathing, rash, itching, fever, chills, feeling dizzy, and/or feeling that your heart is beating in a fast or not normal way   Feeling that your heart is beating in a fast or not normal way (palpitations)   Weight gain of 5 pounds in one week (fluid retention)   Decreased urine or very dark urine   Signs of liver problems: dark urine, pale bowel movements, bad stomach pain, feeling very tired and weak, unusual  itching, or yellowing of the eyes or skin   Heavy menstrual period that lasts longer than normal   Easy bruising or  bleeding   Nausea that stops you from eating or drinking, and/or that is not relieved by prescribed medicines.   Loose bowel movements (diarrhea) more than 4 times a day or diarrhea with weakness or lightheadedness   Pain in your mouth or throat that makes it hard to eat or drink   Lasting loss of appetite or rapid weight loss of five pounds in a week   Signs of peripheral neuropathy: numbness, tingling, or decreased feeling in fingers or toes; trouble walking or changes in the way you walk; or feeling clumsy when buttoning clothes, opening jars, or other routine activities   Joint and muscle pain that is not relieved by prescribed medicines   Extreme fatigue that interferes with normal activities   While you are getting this drug, please tell your nurse right away if you have any pain, redness, or swelling at the site of the IV infusion.   If you think you are pregnant.  Reproduction Warnings   Pregnancy warning: This drug may have harmful effects on the unborn child, it is recommended that effective methods of birth control should be used during your cancer treatment. Let your doctor know right away if you think you may be pregnant.   Breast feeding warning: Women should not breast feed during treatment because this drug could enter the breastmilk and cause harm to a breast feeding baby.    Trastuzumab (Herceptin)  About This Drug  Trastuzumab is used to treat cancer. It is given in the vein (IV). The first infusion you receive will be 90 minutes. All subsequent infusions will be given over 30 minutes.   Possible Side Effects   Bone marrow suppression. This is a decrease in the number of white blood cells, red blood cells, and platelets. This may raise your risk of infection, make you tired and weak (fatigue), and raise your risk of bleeding.   Congestive heart failure - your heart has less ability to pump blood properly   Soreness of the mouth and throat. You may have red  areas, white patches, or sores that hurt   Nausea   Diarrhea (loose bowel movements)   Fever   Chills   Tiredness   Infection   Inflammation of nasal passages and throat   Changes in the way food and drinks  taste   Weight loss   Headache   Trouble sleeping   Cough   Upper respiratory infection   Rash  Note: Each of the side effects above was reported in 10% or greater of patients treated with trastuzumab. Not all possible side effects are included above.   Warnings and Precautions   Changes in the tissue of the heart and heart function. Some changes may happen that can cause your heart to have less ability to pump blood. This drug may also increase your risk of heart attack.   Serious and life-threatening lung problems such as inflammation (swelling) and scarring of the lungs which makes breathing difficult.   While you are getting this drug in your vein (IV), you may have a reaction to the drug. Sometimes you may be given medication to stop or lessen these side effects. Your nurse will check you closely for these signs: fever or shaking chills, flushing, facial swelling, feeling dizzy, headache, trouble breathing, rash, itching, chest tightness, or chest pain. These reactions may happen after your infusion. If this happens, call 911 for emergency care.   Severe decrease in the number of white blood cells. This may raise your risk of infection which may be life-threatening.  Note: Some of the side effects above are very rare. If you have concerns and/or questions, please discuss them with your medical team.  Important Information   This drug may be present in the saliva, tears, sweat, urine, stool, vomit, semen, and vaginal secretions. Talk to your doctor and/or your nurse about the necessary precautions to take during this time.  Treating Side Effects   Manage tiredness by pacing your activities for the day.   Be sure to include periods of rest between  energy-draining activities.   To decrease the risk of infection, wash your hands regularly.   Avoid close contact with people who have a cold, the flu, or other infections.   Take your temperature as your doctor or nurse tells you, and whenever you feel like you may have a fever.   To help decrease the risk of bleeding, use a soft toothbrush. Check with your nurse before using dental floss.   Be very careful when using knives or tools.   Use an electric shaver instead of a razor.   Drink plenty of fluids (a minimum of eight glasses per day is recommended).   To help with nausea, eat small, frequent meals instead of three large meals a day. Choose foods and drinks that are at room temperature. Ask your nurse or doctor about other helpful tips and medicine that is available to help stop or lessen these symptoms.   Mouth care is very important. Your mouth care should consist of routine, gentle cleaning of your teeth or dentures and rinsing your mouth with a mixture of 1/2 teaspoon of salt in 8 ounces of water or 1/2 teaspoon of baking soda in 8 ounces of water. This should be done at least after each meal and at bedtime.   If you have mouth sores, avoid mouthwash that has alcohol. Also avoid alcohol and smoking because they can bother your mouth and throat.   If you throw up or have loose bowel movements, you should drink more fluids so that you do not become dehydrated (lack of water in the body from losing too much fluid).   If you have diarrhea, eat low-fiber foods that are high in protein and calories and avoid foods that can irritate your digestive tracts or  lead to cramping.   Ask your nurse or doctor about medicine that can lessen or stop your diarrhea.   To help with weight loss, drink fluids that contribute calories (whole milk, juice, soft drinks, sweetened beverages, milkshakes, and nutritional supplements) instead of water.   Include a source of protein at every meal  and snack, such as meat, poultry, fish, dry beans, tofu, eggs, nuts, milk, yogurt, cheese, ice cream, pudding, and nutritional supplements.   If you get a rash do not put anything on it unless your doctor or nurse says you may. Keep the area around the rash clean and dry. Ask your doctor for medicine if your rash bothers you.   Keeping your pain under control is important to your well-being. Please tell your doctor or nurse if you are experiencing pain.   If you are having trouble sleeping, talk to your nurse or doctor on tips to help you sleep better   Infusion reactions may occur after your infusion. If this happens, call 911 for emergency care.  Food and Drug Interactions   There are no known interactions of trastuzumab-xxxx with food.   This drug may interact with other medicines. Tell your doctor and pharmacist about all the prescription and over-the-counter medicines and dietary supplements (vitamins, minerals, herbs and others) that you are taking at this time. Also, check with your doctor or pharmacist before starting any new prescription or over-the-counter medicines, or dietary supplements to make sure that there are no interactions.  When to Call the Doctor  Call your doctor or nurse if you have any of these symptoms and/or any new or unusual symptoms:   Fever of 100.4 F (38 C) or higher   Chills   Tiredness that interferes with your daily activities   Trouble falling or staying asleep   Feeling dizzy or lightheaded   A headache that does not go away   Easy bleeding or bruising   Wheezing or trouble breathing or dry cough   Coughing up yellow, green, or bloody mucus   Feeling that your heart is beating in a fast or not normal way (palpitations)   Chest pain or symptoms of a heart attack. Most heart attacks involve pain in the center of the chest that lasts more than a few minutes. The pain may go away and come back, or it can be constant. It can feel like  pressure, squeezing, fullness, or pain. Sometimes pain is felt in one or both arms, the back, neck, jaw, or stomach. If any of these symptoms last 2 minutes, call 911.   Pain in your mouth or throat that makes it hard to eat or drink   Nausea that stops you from eating or drinking and/or is not relieved by prescribed medicines   Diarrhea, 4 times in one day or diarrhea with lack of strength or a feeling of being dizzy   Lasting loss of appetite or rapid weight loss of five pounds in a week   Swelling of arms, hand, legs, and/or feet    SELF CARE ACTIVITIES WHILE ON CHEMOTHERAPY/IMMUNOTHERAPY:  Hydration Increase your fluid intake and drink at least 1 liter of water/decaffeinated beverages per day. You can still have your cup of coffee or soda but these beverages do not count as part of your 8 to 12 cups that you need to drink daily. No alcohol intake.  Medications Continue taking your normal prescription medication as prescribed.  If you start any new herbal or new supplements please let  us know first to make sure it is safe.  Mouth Care Have teeth cleaned professionally before starting treatment. Keep dentures and partial plates clean. Use soft toothbrush and do not use mouthwashes that contain alcohol. Biotene is a good mouthwash that is available at most pharmacies or may be ordered by calling [phone]. Use warm salt water gargles (1 teaspoon salt per 1 quart warm water) before and after meals and at bedtime. Or you may rinse with 2 tablespoons of three-percent hydrogen peroxide mixed in eight ounces of water. If you are still having problems with your mouth or sores in your mouth please call the clinic. If you need dental work, please let the doctor know before you go for your appointment so that we can coordinate the best possible time for you in regards to your chemo regimen. You need to also let your dentist know that you are actively taking chemo. We may need to do labs prior to  your dental appointment.  Skin Care Always use sunscreen that has not expired and with SPF (Sun Protection Factor) of 50 or higher. Wear hats to protect your head from the sun. Remember to use sunscreen on your hands, ears, face, & feet.  Use good moisturizing lotions such as udder cream, eucerin, or even Vaseline. Some chemotherapies can cause dry skin, color changes in your skin and nails.    Avoid long, hot showers or baths. Use gentle, fragrance-free soaps and laundry detergent. Use moisturizers, preferably creams or ointments rather than lotions because the thicker consistency is better at preventing skin dehydration. Apply the cream or ointment within 15 minutes of showering. Reapply moisturizer at night, and moisturize your hands every time after you wash them.   Infection Prevention Please wash your hands for at least 30 seconds using warm soapy water. Handwashing is the #1 way to prevent the spread of germs. Stay away from sick people or people who are getting over a cold. If you develop respiratory systems such as green/yellow mucus production or productive cough or persistent cough let us know and we will see if you need an antibiotic. It is a good idea to keep a pair of gloves on when going into grocery stores/[employer] to decrease your risk of coming into contact with germs on the carts, etc. Carry alcohol hand gel with you at all times and use it frequently if out in public. If your temperature reaches 100.5 or higher please call the clinic and let us know.  If it is after hours or on the weekend please go to the ER if your temperature is over 100.4.  Please have your own personal thermometer at home to use.    Sex and bodily fluids If you are going to have sex, a condom must be used to protect the person that isn't taking immunotherapy. For a few days after treatment, immunotherapy can be excreted through your bodily fluids.  When using the toilet please close the lid and flush the toilet  twice.  Do this for a few day after you have had immunotherapy.   Contraception It is not known for sure whether or not immunotherapy drugs can be passed on through semen or secretions from the vagina. Because of this some doctors advise people to use a barrier method if you have sex during treatment. This applies to vaginal, anal or oral sex.  Generally, doctors advise a barrier method only for the time you are actually having the treatment and for about a week after  your treatment.  Advice like this can be worrying, but this does not mean that you have to avoid being intimate with your partner. You can still have close contact with your partner and continue to enjoy sex.  Animals If you have cats or birds we just ask that you not change the litter or change the cage.  Please have someone else do this for you while you are on immunotherapy.   Food Safety During and After Cancer Treatment Food safety is important for people both during and after cancer treatment. Cancer and cancer treatments, such as chemotherapy, radiation therapy, and stem cell/bone marrow transplantation, often weaken the immune system. This makes it harder for your body to protect itself from foodborne illness, also called food poisoning. Foodborne illness is caused by eating food that contains harmful bacteria, parasites, or viruses.  Foods to avoid Some foods have a higher risk of becoming tainted with bacteria. These include: Unwashed fresh fruit and vegetables, especially leafy vegetables that can hide dirt and other contaminants Raw sprouts, such as alfalfa sprouts Raw or undercooked beef, especially ground beef, or other raw or undercooked meat and poultry Fatty, fried, or spicy foods immediately before or after treatment.  These can sit heavy on your stomach and make you feel nauseous. Raw or undercooked shellfish, such as oysters. Sushi and sashimi, which often contain raw fish.  Unpasteurized beverages, such as  unpasteurized fruit juices, raw milk, raw yogurt, or cider Undercooked eggs, such as soft boiled, over easy, and poached; raw, unpasteurized eggs; or foods made with raw egg, such as homemade raw cookie dough and homemade mayonnaise  Simple steps for food safety  Shop smart. Do not buy food stored or displayed in an unclean area. Do not buy bruised or damaged fruits or vegetables. Do not buy cans that have cracks, dents, or bulges. Pick up foods that can spoil at the end of your shopping trip and store them in a cooler on the way home.  Prepare and clean up foods carefully. Rinse all fresh fruits and vegetables under running water, and dry them with a clean towel or paper towel. Clean the top of cans before opening them. After preparing food, wash your hands for 20 seconds with hot water and soap. Pay special attention to areas between fingers and under nails. Clean your utensils and dishes with hot water and soap. Disinfect your kitchen and cutting boards using 1 teaspoon of liquid, unscented bleach mixed into 1 quart of water.    Dispose of old food. Eat canned and packaged food before its expiration date (the "use by" or "best before" date). Consume refrigerated leftovers within 3 to 4 days. After that time, throw out the food. Even if the food does not smell or look spoiled, it still may be unsafe. Some bacteria, such as Listeria, can grow even on foods stored in the refrigerator if they are kept for too long.  Take precautions when eating out. At restaurants, avoid buffets and salad bars where food sits out for a long time and comes in contact with many people. Food can become contaminated when someone with a virus, often a norovirus, or another "bug" handles it. Put any leftover food in a "to-go" container yourself, rather than having the server do it. And, refrigerate leftovers as soon as you get home. Choose restaurants that are clean and that are willing to prepare your food as you  order it cooked.    SYMPTOMS TO REPORT AS SOON AS POSSIBLE  AFTER TREATMENT:  FEVER GREATER THAN 100.4 F CHILLS WITH OR WITHOUT FEVER NAUSEA AND VOMITING THAT IS NOT CONTROLLED WITH YOUR NAUSEA MEDICATION UNUSUAL SHORTNESS OF BREATH UNUSUAL BRUISING OR BLEEDING TENDERNESS IN MOUTH AND THROAT WITH OR WITHOUT PRESENCE OF ULCERS URINARY PROBLEMS BOWEL PROBLEMS UNUSUAL RASH     Wear comfortable clothing and clothing appropriate for easy access to any Portacath or PICC line. Let us know if there is anything that we can do to make your therapy better!   What to do if you need assistance after hours or on the weekends: CALL [phone].  HOLD on the line, do not hang up.  You will hear multiple messages but at the end you will be connected with a nurse triage line.  They will contact the doctor if necessary.  Most of the time they will be able to assist you.  Do not call the hospital operator.    I have been informed and understand all of the instructions given to me and have received a copy. I have been instructed to call the clinic [phone] or my family physician as soon as possible for continued medical care, if indicated. I do not have any more questions at this time but understand that I may call the [place] or the Patient Navigator at [phone] during office hours should I have questions or need assistance in obtaining follow-up care.

## 2022-12-08 ENCOUNTER — Inpatient Hospital Stay: Payer: PPO | Admitting: Licensed Clinical Social Worker

## 2022-12-08 DIAGNOSIS — Z170 Estrogen receptor positive status [ER+]: Secondary | ICD-10-CM

## 2022-12-08 NOTE — Progress Notes (Signed)
[facility] Clinical Social Work  Initial Assessment   Haley Peterson is a 77 y.o. year old female contacted by phone. Clinical Social Work was referred by medical provider for assessment of psychosocial needs.   SDOH (Social Determinants of Health) assessments performed: Yes   SDOH Screenings   Tobacco Use: Medium Risk (12/06/2022)     Distress Screen completed: No     No data to display            Family/Social Information:  Housing Arrangement: patient lives alone.  Pt is independent in ADLs and continues to drive.  Pt previously diagnosed w/ breast cancer in 2002, at which time pt underwent a lumpectomy and radiation.  Pt is s/p mastectomy and is anticipated to start chemotherapy next week.   Family members/support persons in your life? Pt's brother, sister-in-law, and niece reside close by and are able to offer assistance if needed. Transportation concerns: no  Employment: Retired previously worked as an Web designer.  Income source: Paediatric nurse concerns: No Type of concern: None Food access concerns: no Religious or spiritual practice: Yes-Episcopalian Services Currently in place:  none  Coping/ Adjustment to diagnosis: Patient understands treatment plan and what happens next? yes Concerns about diagnosis and/or treatment:  Pt concerned w/ how she may react to chemotherapy as she did not need to receive this treatment w/ previous breast cancer diagnosis.  Pt has supportive family in place that will provide assistance if needed.  Pt values her independence. Patient reported stressors: Anxiety/ nervousness Hopes and/or priorities: Pt's priority is to start treatment w/ the hope of a positive outcome Patient enjoys time with family/ friends Current coping skills/ strengths: Capable of independent living , Scientist, research (life sciences) , Motivation for treatment/growth , Physical Health , and Supportive family/friends     SUMMARY: Current SDOH Barriers:   None identified  Clinical Social Work Clinical Goal(s):  No clinical social work goals at this time  Interventions: Discussed common feeling and emotions when being diagnosed with cancer, and the importance of support during treatment Informed patient of the support team roles and support services at [facility] Provided [facility] contact information and encouraged patient to call with any questions or concerns Referred patient to [facility] for supportive services should pt determine they may be helpful during treatment   Follow Up Plan: Patient will contact CSW with any support or resource needs Patient verbalizes understanding of plan: Yes    Henriette Combs, LCSW

## 2022-12-11 ENCOUNTER — Other Ambulatory Visit: Payer: Self-pay

## 2022-12-13 ENCOUNTER — Inpatient Hospital Stay: Payer: PPO

## 2022-12-13 VITALS — BP 137/58 | HR 79 | Temp 98.3°F | Resp 16

## 2022-12-13 DIAGNOSIS — Z170 Estrogen receptor positive status [ER+]: Secondary | ICD-10-CM

## 2022-12-13 DIAGNOSIS — C50412 Malignant neoplasm of upper-outer quadrant of left female breast: Secondary | ICD-10-CM

## 2022-12-13 DIAGNOSIS — Z95828 Presence of other vascular implants and grafts: Secondary | ICD-10-CM

## 2022-12-13 LAB — CBC WITH DIFFERENTIAL/PLATELET
Abs Immature Granulocytes: 0.03 10*3/uL (ref 0.00–0.07)
Basophils Absolute: 0.1 10*3/uL (ref 0.0–0.1)
Basophils Relative: 1 %
Eosinophils Absolute: 0.3 10*3/uL (ref 0.0–0.5)
Eosinophils Relative: 4 %
HCT: 38 % (ref 36.0–46.0)
Hemoglobin: 12.2 g/dL (ref 12.0–15.0)
Immature Granulocytes: 0 %
Lymphocytes Relative: 22 %
Lymphs Abs: 1.8 10*3/uL (ref 0.7–4.0)
MCH: 29.4 pg (ref 26.0–34.0)
MCHC: 32.1 g/dL (ref 30.0–36.0)
MCV: 91.6 fL (ref 80.0–100.0)
Monocytes Absolute: 0.7 10*3/uL (ref 0.1–1.0)
Monocytes Relative: 9 %
Neutro Abs: 5.1 10*3/uL (ref 1.7–7.7)
Neutrophils Relative %: 64 %
Platelets: 269 10*3/uL (ref 150–400)
RBC: 4.15 MIL/uL (ref 3.87–5.11)
RDW: 12.8 % (ref 11.5–15.5)
WBC: 8.1 10*3/uL (ref 4.0–10.5)
nRBC: 0 % (ref 0.0–0.2)

## 2022-12-13 LAB — COMPREHENSIVE METABOLIC PANEL
ALT: 15 U/L (ref 0–44)
AST: 17 U/L (ref 15–41)
Albumin: 3.4 g/dL — ABNORMAL LOW (ref 3.5–5.0)
Alkaline Phosphatase: 63 U/L (ref 38–126)
Anion gap: 9 (ref 5–15)
BUN: 23 mg/dL (ref 8–23)
CO2: 25 mmol/L (ref 22–32)
Calcium: 9 mg/dL (ref 8.9–10.3)
Chloride: 105 mmol/L (ref 98–111)
Creatinine, Ser: 1.16 mg/dL — ABNORMAL HIGH (ref 0.44–1.00)
GFR, Estimated: 49 mL/min — ABNORMAL LOW (ref >60)
Glucose, Bld: 106 mg/dL — ABNORMAL HIGH (ref 70–99)
Potassium: 3.7 mmol/L (ref 3.5–5.1)
Sodium: 139 mmol/L (ref 135–145)
Total Bilirubin: 0.7 mg/dL (ref 0.3–1.2)
Total Protein: 6.3 g/dL — ABNORMAL LOW (ref 6.5–8.1)

## 2022-12-13 LAB — MAGNESIUM: Magnesium: 2 mg/dL (ref 1.7–2.4)

## 2022-12-13 MED ORDER — HEPARIN SOD (PORK) LOCK FLUSH 100 UNIT/ML IV SOLN
500.0000 [IU] | Freq: Once | INTRAVENOUS | Status: AC | PRN
  Administered 2022-12-13: 500 [IU]

## 2022-12-13 MED ORDER — SODIUM CHLORIDE 0.9 % IV SOLN
64.0000 mg/m2 | Freq: Once | INTRAVENOUS | Status: AC
  Administered 2022-12-13: 126 mg via INTRAVENOUS
  Filled 2022-12-13: qty 21

## 2022-12-13 MED ORDER — ACETAMINOPHEN 325 MG PO TABS
650.0000 mg | ORAL_TABLET | Freq: Once | ORAL | Status: AC
  Administered 2022-12-13: 650 mg via ORAL
  Filled 2022-12-13: qty 2

## 2022-12-13 MED ORDER — DIPHENHYDRAMINE HCL 50 MG/ML IJ SOLN: 50.0000 mg | Freq: Once | INTRAMUSCULAR | Status: DC

## 2022-12-13 MED ORDER — CETIRIZINE HCL 10 MG/ML IV SOLN
10.0000 mg | Freq: Once | INTRAVENOUS | Status: AC
  Administered 2022-12-13: 10 mg via INTRAVENOUS
  Filled 2022-12-13: qty 1

## 2022-12-13 MED ORDER — SODIUM CHLORIDE 0.9 % IV SOLN: Freq: Once | INTRAVENOUS | Status: AC

## 2022-12-13 MED ORDER — SODIUM CHLORIDE 0.9% FLUSH
10.0000 mL | INTRAVENOUS | Status: DC | PRN
  Administered 2022-12-13: 10 mL

## 2022-12-13 MED ORDER — FAMOTIDINE IN NACL 20-0.9 MG/50ML-% IV SOLN
20.0000 mg | Freq: Once | INTRAVENOUS | Status: AC
  Administered 2022-12-13: 20 mg via INTRAVENOUS
  Filled 2022-12-13: qty 50

## 2022-12-13 MED ORDER — SODIUM CHLORIDE 0.9 % IV SOLN
10.0000 mg | Freq: Once | INTRAVENOUS | Status: AC
  Administered 2022-12-13: 10 mg via INTRAVENOUS
  Filled 2022-12-13: qty 10

## 2022-12-13 MED ORDER — TRASTUZUMAB-ANNS CHEMO 420 MG IV SOLR
4.0000 mg/kg | Freq: Once | INTRAVENOUS | Status: AC
  Administered 2022-12-13: 336 mg via INTRAVENOUS
  Filled 2022-12-13: qty 16

## 2022-12-13 NOTE — Progress Notes (Signed)
Patient presents today for D1C1 Kanjinti/Taxol infusions per providers order.  Vital signs within parameters for treatment.  Labs pending.  Patient has no complaints at this time.  Labs within parameters for treatment.  Treatment given today per MD orders.  Stable during infusion without adverse affects.  Vital signs stable.  No complaints at this time.  Discharge from clinic ambulatory in stable condition.  Alert and oriented X 3.  Follow up with [facility] as scheduled.

## 2022-12-13 NOTE — Patient Instructions (Signed)
[facility]  Discharge Instructions: Thank you for choosing [facility] to provide your oncology and hematology care.  If you have a lab appointment with the [facility], please come in thru the Main Entrance and check in at the main information desk.  Wear comfortable clothing and clothing appropriate for easy access to any Portacath or PICC line.   We strive to give you quality time with your provider. You may need to reschedule your appointment if you arrive late (15 or more minutes).  Arriving late affects you and other patients whose appointments are after yours.  Also, if you miss three or more appointments without notifying the office, you may be dismissed from the clinic at the provider's discretion.      For prescription refill requests, have your pharmacy contact our office and allow 72 hours for refills to be completed.    Today you received the following chemotherapy and/or immunotherapy agents Taxol/Kanjinti      To help prevent nausea and vomiting after your treatment, we encourage you to take your nausea medication as directed.  BELOW ARE SYMPTOMS THAT SHOULD BE REPORTED IMMEDIATELY: *FEVER GREATER THAN 100.4 F (38 C) OR HIGHER *CHILLS OR SWEATING *NAUSEA AND VOMITING THAT IS NOT CONTROLLED WITH YOUR NAUSEA MEDICATION *UNUSUAL SHORTNESS OF BREATH *UNUSUAL BRUISING OR BLEEDING *URINARY PROBLEMS (pain or burning when urinating, or frequent urination) *BOWEL PROBLEMS (unusual diarrhea, constipation, pain near the anus) TENDERNESS IN MOUTH AND THROAT WITH OR WITHOUT PRESENCE OF ULCERS (sore throat, sores in mouth, or a toothache) UNUSUAL RASH, SWELLING OR PAIN  UNUSUAL VAGINAL DISCHARGE OR ITCHING   Items with * indicate a potential emergency and should be followed up as soon as possible or go to the Emergency Department if any problems should occur.  Please show the CHEMOTHERAPY ALERT CARD or IMMUNOTHERAPY ALERT CARD at check-in to the  Emergency Department and triage nurse.  Should you have questions after your visit or need to cancel or reschedule your appointment, please contact [facility] [phone]  and follow the prompts.  Office hours are 8:00 a.m. to 4:30 p.m. Monday - Friday. Please note that voicemails left after 4:00 p.m. may not be returned until the following business day.  We are closed weekends and major holidays. You have access to a nurse at all times for urgent questions. Please call the main number to the clinic [phone] and follow the prompts.  For any non-urgent questions, you may also contact your provider using MyChart. We now offer e-Visits for anyone 40 and older to request care online for non-urgent symptoms. For details visit [email or web address].   Also download the MyChart app! Go to the app store, search "MyChart", open the app, select [facility], and log in with your MyChart username and password.

## 2022-12-14 NOTE — Progress Notes (Signed)
24 hour call back.  No answer.  Left message.

## 2022-12-16 ENCOUNTER — Inpatient Hospital Stay: Payer: PPO | Attending: Hematology | Admitting: Licensed Clinical Social Worker

## 2022-12-16 DIAGNOSIS — Z170 Estrogen receptor positive status [ER+]: Secondary | ICD-10-CM

## 2022-12-16 DIAGNOSIS — Z79899 Other long term (current) drug therapy: Secondary | ICD-10-CM | POA: Insufficient documentation

## 2022-12-16 DIAGNOSIS — Z5112 Encounter for antineoplastic immunotherapy: Secondary | ICD-10-CM | POA: Insufficient documentation

## 2022-12-16 DIAGNOSIS — C50412 Malignant neoplasm of upper-outer quadrant of left female breast: Secondary | ICD-10-CM | POA: Insufficient documentation

## 2022-12-16 DIAGNOSIS — Z5111 Encounter for antineoplastic chemotherapy: Secondary | ICD-10-CM | POA: Insufficient documentation

## 2022-12-16 NOTE — Progress Notes (Signed)
[facility] CSW Progress Note  Clinical Education officer, museum contacted patient by phone to discuss resources to obtain a wig.  Wig voucher provided by the [facility] mailed to pt w/ instructions to redeem.  CSW to remain available as appropriate to provide support throughout duration of treatment.    Henriette Combs, LCSW

## 2022-12-20 ENCOUNTER — Inpatient Hospital Stay: Payer: PPO

## 2022-12-20 ENCOUNTER — Encounter: Payer: Self-pay | Admitting: Hematology

## 2022-12-20 ENCOUNTER — Inpatient Hospital Stay (HOSPITAL_BASED_OUTPATIENT_CLINIC_OR_DEPARTMENT_OTHER): Payer: PPO | Admitting: Hematology

## 2022-12-20 VITALS — BP 142/63 | HR 75 | Temp 97.5°F | Resp 18

## 2022-12-20 DIAGNOSIS — Z170 Estrogen receptor positive status [ER+]: Secondary | ICD-10-CM

## 2022-12-20 DIAGNOSIS — Z5111 Encounter for antineoplastic chemotherapy: Secondary | ICD-10-CM | POA: Diagnosis not present

## 2022-12-20 DIAGNOSIS — Z79899 Other long term (current) drug therapy: Secondary | ICD-10-CM | POA: Diagnosis not present

## 2022-12-20 DIAGNOSIS — Z95828 Presence of other vascular implants and grafts: Secondary | ICD-10-CM | POA: Diagnosis not present

## 2022-12-20 DIAGNOSIS — C50412 Malignant neoplasm of upper-outer quadrant of left female breast: Secondary | ICD-10-CM | POA: Diagnosis not present

## 2022-12-20 DIAGNOSIS — Z5112 Encounter for antineoplastic immunotherapy: Secondary | ICD-10-CM | POA: Diagnosis not present

## 2022-12-20 LAB — COMPREHENSIVE METABOLIC PANEL
ALT: 22 U/L (ref 0–44)
AST: 17 U/L (ref 15–41)
Albumin: 3.5 g/dL (ref 3.5–5.0)
Alkaline Phosphatase: 63 U/L (ref 38–126)
Anion gap: 7 (ref 5–15)
BUN: 22 mg/dL (ref 8–23)
CO2: 26 mmol/L (ref 22–32)
Calcium: 9.1 mg/dL (ref 8.9–10.3)
Chloride: 104 mmol/L (ref 98–111)
Creatinine, Ser: 1.09 mg/dL — ABNORMAL HIGH (ref 0.44–1.00)
GFR, Estimated: 53 mL/min — ABNORMAL LOW (ref >60)
Glucose, Bld: 96 mg/dL (ref 70–99)
Potassium: 4.2 mmol/L (ref 3.5–5.1)
Sodium: 137 mmol/L (ref 135–145)
Total Bilirubin: 0.4 mg/dL (ref 0.3–1.2)
Total Protein: 6.4 g/dL — ABNORMAL LOW (ref 6.5–8.1)

## 2022-12-20 LAB — CBC WITH DIFFERENTIAL/PLATELET
Abs Immature Granulocytes: 0.02 10*3/uL (ref 0.00–0.07)
Basophils Absolute: 0 10*3/uL (ref 0.0–0.1)
Basophils Relative: 1 %
Eosinophils Absolute: 0.2 10*3/uL (ref 0.0–0.5)
Eosinophils Relative: 4 %
HCT: 36.7 % (ref 36.0–46.0)
Hemoglobin: 11.7 g/dL — ABNORMAL LOW (ref 12.0–15.0)
Immature Granulocytes: 0 %
Lymphocytes Relative: 24 %
Lymphs Abs: 1.5 10*3/uL (ref 0.7–4.0)
MCH: 29.1 pg (ref 26.0–34.0)
MCHC: 31.9 g/dL (ref 30.0–36.0)
MCV: 91.3 fL (ref 80.0–100.0)
Monocytes Absolute: 0.4 10*3/uL (ref 0.1–1.0)
Monocytes Relative: 6 %
Neutro Abs: 4.1 10*3/uL (ref 1.7–7.7)
Neutrophils Relative %: 65 %
Platelets: 297 10*3/uL (ref 150–400)
RBC: 4.02 MIL/uL (ref 3.87–5.11)
RDW: 12.8 % (ref 11.5–15.5)
WBC: 6.2 10*3/uL (ref 4.0–10.5)
nRBC: 0 % (ref 0.0–0.2)

## 2022-12-20 LAB — MAGNESIUM: Magnesium: 2.1 mg/dL (ref 1.7–2.4)

## 2022-12-20 MED ORDER — SODIUM CHLORIDE 0.9% FLUSH
10.0000 mL | Freq: Once | INTRAVENOUS | Status: AC
  Administered 2022-12-20: 10 mL via INTRAVENOUS

## 2022-12-20 MED ORDER — TRASTUZUMAB-ANNS CHEMO 150 MG IV SOLR
2.0000 mg/kg | Freq: Once | INTRAVENOUS | Status: AC
  Administered 2022-12-20: 168 mg via INTRAVENOUS
  Filled 2022-12-20: qty 8

## 2022-12-20 MED ORDER — CETIRIZINE HCL 10 MG/ML IV SOLN
10.0000 mg | Freq: Once | INTRAVENOUS | Status: AC
  Administered 2022-12-20: 10 mg via INTRAVENOUS
  Filled 2022-12-20: qty 1

## 2022-12-20 MED ORDER — ACETAMINOPHEN 325 MG PO TABS
650.0000 mg | ORAL_TABLET | Freq: Once | ORAL | Status: AC
  Administered 2022-12-20: 650 mg via ORAL
  Filled 2022-12-20: qty 2

## 2022-12-20 MED ORDER — SODIUM CHLORIDE 0.9 % IV SOLN: Freq: Once | INTRAVENOUS | Status: AC

## 2022-12-20 MED ORDER — FAMOTIDINE IN NACL 20-0.9 MG/50ML-% IV SOLN
20.0000 mg | Freq: Once | INTRAVENOUS | Status: AC
  Administered 2022-12-20: 20 mg via INTRAVENOUS
  Filled 2022-12-20: qty 50

## 2022-12-20 MED ORDER — SODIUM CHLORIDE 0.9 % IV SOLN
64.0000 mg/m2 | Freq: Once | INTRAVENOUS | Status: AC
  Administered 2022-12-20: 126 mg via INTRAVENOUS
  Filled 2022-12-20: qty 21

## 2022-12-20 MED ORDER — HEPARIN SOD (PORK) LOCK FLUSH 100 UNIT/ML IV SOLN
500.0000 [IU] | Freq: Once | INTRAVENOUS | Status: AC | PRN
  Administered 2022-12-20: 500 [IU]

## 2022-12-20 MED ORDER — SODIUM CHLORIDE 0.9% FLUSH
10.0000 mL | INTRAVENOUS | Status: DC | PRN
  Administered 2022-12-20: 10 mL

## 2022-12-20 MED ORDER — SODIUM CHLORIDE 0.9 % IV SOLN
10.0000 mg | Freq: Once | INTRAVENOUS | Status: AC
  Administered 2022-12-20: 10 mg via INTRAVENOUS
  Filled 2022-12-20: qty 1

## 2022-12-20 NOTE — Patient Instructions (Signed)
[facility]  Discharge Instructions: Thank you for choosing [facility] to provide your oncology and hematology care.  If you have a lab appointment with the [facility], please come in thru the Main Entrance and check in at the main information desk.  Wear comfortable clothing and clothing appropriate for easy access to any Portacath or PICC line.   We strive to give you quality time with your provider. You may need to reschedule your appointment if you arrive late (15 or more minutes).  Arriving late affects you and other patients whose appointments are after yours.  Also, if you miss three or more appointments without notifying the office, you may be dismissed from the clinic at the provider's discretion.      For prescription refill requests, have your pharmacy contact our office and allow 72 hours for refills to be completed.    Today you received the following chemotherapy and/or immunotherapy agents Kanjinti/Taxol      To help prevent nausea and vomiting after your treatment, we encourage you to take your nausea medication as directed.  BELOW ARE SYMPTOMS THAT SHOULD BE REPORTED IMMEDIATELY: *FEVER GREATER THAN 100.4 F (38 C) OR HIGHER *CHILLS OR SWEATING *NAUSEA AND VOMITING THAT IS NOT CONTROLLED WITH YOUR NAUSEA MEDICATION *UNUSUAL SHORTNESS OF BREATH *UNUSUAL BRUISING OR BLEEDING *URINARY PROBLEMS (pain or burning when urinating, or frequent urination) *BOWEL PROBLEMS (unusual diarrhea, constipation, pain near the anus) TENDERNESS IN MOUTH AND THROAT WITH OR WITHOUT PRESENCE OF ULCERS (sore throat, sores in mouth, or a toothache) UNUSUAL RASH, SWELLING OR PAIN  UNUSUAL VAGINAL DISCHARGE OR ITCHING   Items with * indicate a potential emergency and should be followed up as soon as possible or go to the Emergency Department if any problems should occur.  Please show the CHEMOTHERAPY ALERT CARD or IMMUNOTHERAPY ALERT CARD at check-in to the  Emergency Department and triage nurse.  Should you have questions after your visit or need to cancel or reschedule your appointment, please contact [facility] [phone]  and follow the prompts.  Office hours are 8:00 a.m. to 4:30 p.m. Monday - Friday. Please note that voicemails left after 4:00 p.m. may not be returned until the following business day.  We are closed weekends and major holidays. You have access to a nurse at all times for urgent questions. Please call the main number to the clinic [phone] and follow the prompts.  For any non-urgent questions, you may also contact your provider using MyChart. We now offer e-Visits for anyone 63 and older to request care online for non-urgent symptoms. For details visit [email or web address].   Also download the MyChart app! Go to the app store, search "MyChart", open the app, select [facility], and log in with your MyChart username and password.

## 2022-12-20 NOTE — Progress Notes (Signed)
Patient presents today for Kanjinti/Taxol infusion per providers order.  Vital signs and labs reviewed by MD.  Message received from Adonis Huguenin RN/Dr. Delton Coombes patient okay for treatment.  Treatment given today per MD orders.  Stable during infusion without adverse affects.  Vital signs stable.  No complaints at this time.  Discharge from clinic ambulatory in stable condition.  Alert and oriented X 3.  Follow up with [facility] as scheduled.

## 2022-12-20 NOTE — Progress Notes (Signed)
[facility] [address] [place] [address]   CLINIC:  Medical Oncology/Hematology  PCP:  Celene Squibb, MD [place] [id number] [phone]   REASON FOR VISIT:  Follow-up for stage Ib HER2 positive left breast cancer  PRIOR THERAPY: 1.  Left mastectomy and SLNB by Dr. Donne Hazel on 10/31/2022  NGS Results: Not applicable  CURRENT THERAPY: Trastuzumab and Herceptin  BRIEF ONCOLOGIC HISTORY:  Oncology History  Breast cancer of upper-outer quadrant of left female breast ([place])  10/25/2022 Initial Diagnosis   Breast cancer of upper-outer quadrant of left female breast ([facility])   12/13/2022 -  Chemotherapy   Patient is on Treatment Plan : BREAST Paclitaxel + Trastuzumab q7d / Trastuzumab q21d       CANCER STAGING:  Cancer Staging  Breast cancer of upper-outer quadrant of left female breast ([facility]) Staging form: Breast, AJCC 8th Edition - Clinical stage from 10/25/2022: Stage IB (cT2, cN0, cM0, G2, ER+, PR+, HER2+) - Unsigned    INTERVAL HISTORY:  Haley Peterson 77 y.o. female seen for follow-up of her left breast cancer.  She received first dose of weekly paclitaxel and Herceptin on 12/13/2022.  She did not experience any diarrhea.  She had experienced some dyspnea on exertion for 3 days after treatment.  Numbness in the feet has been stable.  Energy levels are reported as 100% today.   REVIEW OF SYSTEMS:  Review of Systems  Neurological:  Positive for numbness (Feet stable).  All other systems reviewed and are negative.    PAST MEDICAL/SURGICAL HISTORY:  Past Medical History:  Diagnosis Date   Arthritis    Bone spur    left heel   Breast cancer ([facility])    bilateral   Cancer ([facility])    Bilateral Breast cancer   Cataract    right eye    CKD (chronic kidney disease), stage III (HCC)    mgd by PCP    History of breast cancer    left, right   Hypertension    Personal history of radiation therapy    Pneumonia 10/2012   Port-A-Cath  in place 12/06/2022   Past Surgical History:  Procedure Laterality Date   ABDOMINAL HYSTERECTOMY     BIOPSY  04/12/2017   Procedure: BIOPSY;  Surgeon: Rogene Houston, MD;  [facility];  Service: Endoscopy;;  ascending colon ulcer biopsies   BREAST BIOPSY Left 09/22/2022   [place] LT BREAST BX W LOC DEV EA ADD LESION IMG BX SPEC US GUIDE 09/22/2022 [facility]   BREAST BIOPSY Left 09/22/2022   [place] LT BREAST BX W LOC DEV 1ST LESION IMG BX [place] US GUIDE 09/22/2022 [facility]   BREAST LUMPECTOMY  08/28/2001   left   BREAST RECONSTRUCTION Right    implant   CHOLECYSTECTOMY  2002   COLONOSCOPY     COLONOSCOPY N/A 04/12/2017   Procedure: COLONOSCOPY;  Surgeon: Rogene Houston, MD;  [facility];  Service: Endoscopy;  Laterality: N/A;  830   MASTECTOMY  08/15/2006   right   PARTIAL HYSTERECTOMY  1985   PORTACATH PLACEMENT Right 10/31/2022   Procedure: INSERTION PORT-A-CATH;  Surgeon: [name], MD;  Location: [facility];  Service: General;  Laterality: Right;   REPLACEMENT TOTAL KNEE  2009, 2010   right-2010, left 2009   SIMPLE MASTECTOMY WITH AXILLARY SENTINEL NODE BIOPSY Left 10/31/2022   Procedure: LEFT MASTECTOMY;  Surgeon: [name], MD;  Location: [facility];  Service: General;  Laterality: Left;  90 MIN ROOM 2   TISSUE EXPANDER PLACEMENT     TOTAL HIP ARTHROPLASTY Right 10/09/2017   Procedure: RIGHT TOTAL HIP ARTHROPLASTY ANTERIOR APPROACH;  Surgeon: Frederik Pear, MD;  Location: [facility];  Service: Orthopedics;  Laterality: Right;   TOTAL HIP ARTHROPLASTY Left 09/23/2019   Procedure: Left Anterior Hip Arthroplasty;  Surgeon: Frederik Pear, MD;  Location: [facility];  Service: Orthopedics;  Laterality: Left;     SOCIAL HISTORY:  Social History   Socioeconomic History   Marital status: Divorced    Spouse name: Not on file   Number of children: Not on file   Years of education: Not on file   Highest education  level: Not on file  Occupational History   Not on file  Tobacco Use   Smoking status: Former    Packs/day: 0.50    Years: 5.00    Total pack years: 2.50    Types: Cigarettes    Quit date: 09/28/1983    Years since quitting: 39.2   Smokeless tobacco: Never  Vaping Use   Vaping Use: Never used  Substance and Sexual Activity   Alcohol use: No   Drug use: No   Sexual activity: Not on file  Other Topics Concern   Not on file  Social History Narrative   Not on file   Social Determinants of Health   Financial Resource Strain: Not on file  Food Insecurity: Not on file  Transportation Needs: Not on file  Physical Activity: Not on file  Stress: Not on file  Social Connections: Not on file  Intimate Partner Violence: Not on file    FAMILY HISTORY:  Family History  Problem Relation Age of Onset   Arthritis Mother    Thyroid cancer Brother        2010   Colon cancer Neg Hx    Breast cancer Neg Hx     CURRENT MEDICATIONS:  Outpatient Encounter Medications as of 12/20/2022  Medication Sig Note   Ascorbic Acid (VITAMIN C) 1000 MG tablet Take 1,000 mg by mouth daily with lunch.    Calcium-Vitamin D (CALTRATE 600 PLUS-VIT D PO) Take 1 tablet by mouth daily with lunch.     Cholecalciferol (D-3-5) 125 MCG (5000 UT) capsule Take 5,000 Units by mouth daily.    cholecalciferol (VITAMIN D) 1000 UNITS tablet Take 1,000 Units by mouth daily with lunch.     Garlic (GARLIQUE PO) Take 1 tablet by mouth daily with lunch.    hydrochlorothiazide (HYDRODIURIL) 12.5 MG tablet Take 12.5 mg by mouth daily. 10/24/2022: Patient states she is not currently taking   lidocaine-prilocaine (EMLA) cream Apply a quarter sized amount to port a cath site and cover with plastic wrap 1 hour prior to infusion appointments    losartan (COZAAR) 100 MG tablet Take 100 mg by mouth daily.    NON FORMULARY Balance of Nature - 3 fruits and 3 Vegetables    Omega-3 Fatty Acids (FISH OIL) 1200 MG CPDR Take by mouth.     PACLITAXEL IV Inject into the vein once a week.    prochlorperazine (COMPAZINE) 10 MG tablet Take 1 tablet (10 mg total) by mouth every 6 (six) hours as needed for nausea or vomiting.    TRASTUZUMAB IV Inject into the vein once a week. Weekly x 12 weeks, then every 21 days    TURMERIC PO Take 3 capsules by mouth daily with lunch.    vitamin B-12 (CYANOCOBALAMIN)  100 MCG tablet Take 100 mcg by mouth daily.    Facility-Administered Encounter Medications as of 12/20/2022  Medication   [COMPLETED] sodium chloride flush (NS) 0.9 % injection 10 mL    ALLERGIES:  Allergies  Allergen Reactions   Penicillins Other (See Comments)    "LOSS OF VISION"  Has patient had a PCN reaction causing immediate rash, facial/tongue/throat swelling, SOB or lightheadedness with hypotension: No Has patient had a PCN reaction causing severe rash involving mucus membranes or skin necrosis: No Has patient had a PCN reaction that required hospitalization: No Has patient had a PCN reaction occurring within the last 10 years: No If all of the above answers are "NO", then may proceed with Cephalosporin use.    Oxytetracycline Other (See Comments)    Fever blisters   Tamoxifen Other (See Comments)    "GENERAL ILLNESS WITH LIGHTHEADNESS"    Aromasin [Exemestane] Other (See Comments)    UNSPECIFIED REACTION  Pt is unsure of what reaction was...   Codeine Other (See Comments)    LIGHTHEADED     PHYSICAL EXAM:  ECOG Performance status: 1  There were no vitals filed for this visit.  There were no vitals filed for this visit.  Physical Exam Vitals reviewed.  Constitutional:      Appearance: Normal appearance.  Cardiovascular:     Rate and Rhythm: Normal rate and regular rhythm.     Heart sounds: Normal heart sounds.  Pulmonary:     Effort: Pulmonary effort is normal.     Breath sounds: Normal breath sounds.  Neurological:     Mental Status: She is alert.  Psychiatric:        Mood and Affect: Mood  normal.        Behavior: Behavior normal.      LABORATORY DATA:  I have reviewed the labs as listed.  CBC    Component Value Date/Time   WBC 6.2 12/20/2022 1126   RBC 4.02 12/20/2022 1126   HGB 11.7 (L) 12/20/2022 1126   HCT 36.7 12/20/2022 1126   PLT 297 12/20/2022 1126   MCV 91.3 12/20/2022 1126   MCH 29.1 12/20/2022 1126   MCHC 31.9 12/20/2022 1126   RDW 12.8 12/20/2022 1126   LYMPHSABS 1.5 12/20/2022 1126   MONOABS 0.4 12/20/2022 1126   EOSABS 0.2 12/20/2022 1126   BASOSABS 0.0 12/20/2022 1126      Latest Ref Rng & Units 12/20/2022   11:26 AM 12/13/2022    7:53 AM 10/26/2022    9:06 AM  CMP  Glucose 70 - 99 mg/dL 96  106  102   BUN 8 - 23 mg/dL '22  23  28   '$ Creatinine 0.44 - 1.00 mg/dL 1.09  1.16  1.16   Sodium 135 - 145 mmol/L 137  139  142   Potassium 3.5 - 5.1 mmol/L 4.2  3.7  4.3   Chloride 98 - 111 mmol/L 104  105  108   CO2 22 - 32 mmol/L '26  25  27   '$ Calcium 8.9 - 10.3 mg/dL 9.1  9.0  9.2   Total Protein 6.5 - 8.1 g/dL 6.4  6.3  6.9   Total Bilirubin 0.3 - 1.2 mg/dL 0.4  0.7  0.5   Alkaline Phos 38 - 126 U/L 63  63  65   AST 15 - 41 U/L '17  17  17   '$ ALT 0 - 44 U/L '22  15  18     '$ DIAGNOSTIC IMAGING:  I  have independently reviewed the scans and discussed with the patient.  ASSESSMENT:  1.  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  In the 2:00 location 3 cm from the nipple, mass measures 0.8 x 0.9 x 0.9 cm.  Measured together, these lesions span 2.4 cm in the 2 o'clock position.  Left axilla is negative for adenopathy. - Biopsy (09/22/2022): 1. left breast 2:00, 1 cmfn-invasive ductal carcinoma, grade 2, ER 100%, PR 95%, Ki-67 20%, HER2 2+ by IHC, HER2 FISH positive with ratio of 2.45 2.  Left breast 2:00 3 cmfn-invasive ductal carcinoma, grade 2,  ER/PR 100%, Ki-67 15%, HER2 2+ by IHC, FISH negative - Left mastectomy, SLNB by Dr. Donne Hazel on 10/31/2022 - Pathology: 2.5 x 2.1 x 1.2 cm invasive moderately differentiated adenocarcinoma, grade 2, margins free, extensive lymphatic invasion.  0/1 lymph node positive.  Associated focal intermediate grade DCIS. - Adjuvant chemotherapy with weekly paclitaxel and Herceptin started on 12/13/2022   2.  Social/family history: - Lives at home by herself and is independent of ADLs and IADLs.  She worked as an Research officer, trade union.  Quit smoking 40 years ago. - Brother had thyroid cancer.   3.  Stage I right breast cancer: - Diagnosed on 05/15/2006, right breast 11 o'clock position - Right mastectomy (08/15/2006): 1.3 cm IDC, ER 83% positive, PR 84% positive, HER2 negative, Ki-67 4% - Tamoxifen followed by exemestane followed by letrozole for 10 years.   4.  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  Osteopenia: - Last bone density on 02/03/2021: T-score -1.9. - We will plan to obtain DEXA scan prior to initiation of AI.    PLAN:  1.  Stage Ia (T2 N0) HER2 positive left breast cancer: - She received first cycle of weekly paclitaxel and Herceptin on 12/13/2022. - She felt dyspnea on exertion for 3 days after treatment.  She was able to do her day-to-day activities.  No worsening of neuropathy.  Energy levels are 100%.  Labs today shows LFTs are normal.  Creatinine is stable at 1.09.  CBC was grossly normal. - Proceed with treatment today and next week at the same dose of 80%.  RTC 2 weeks for follow-up.  2.  High risk drug monitoring: - Echocardiogram on 11/21/2022 with LVEF 75%. - Will repeat echocardiogram in 3 months.  3.  Peripheral neuropathy: - Baseline tingling in the lower legs and feet for many years is stable.  Will continue to monitor while on paclitaxel.      Orders placed this encounter:  Orders Placed This Encounter   Procedures   Magnesium      Derek Jack, MD [facility] [phone]

## 2022-12-20 NOTE — Progress Notes (Signed)
Patient has been examined by Dr. Katragadda, and vital signs and labs have been reviewed. ANC, Creatinine, LFTs, hemoglobin, and platelets are within treatment parameters per M.D. - pt may proceed with treatment.  Primary RN and pharmacy notified.  

## 2022-12-20 NOTE — Patient Instructions (Addendum)
[facility] at [facility] Discharge Instructions   You were seen and examined today by Dr. Katragadda.  He reviewed the results of your lab work which are normal/stable.   We will proceed with your treatment today.  Return as scheduled.    Thank you for choosing [facility] at [facility] to provide your oncology and hematology care.  To afford each patient quality time with our provider, please arrive at least 15 minutes before your scheduled appointment time.   If you have a lab appointment with the Cancer Center please come in thru the Main Entrance and check in at the main information desk.  You need to re-schedule your appointment should you arrive 10 or more minutes late.  We strive to give you quality time with our providers, and arriving late affects you and other patients whose appointments are after yours.  Also, if you no show three or more times for appointments you may be dismissed from the clinic at the providers discretion.     Again, thank you for choosing [facility].  Our hope is that these requests will decrease the amount of time that you wait before being seen by our physicians.       _____________________________________________________________  Should you have questions after your visit to  Cancer Center, please contact our office at [phone] and follow the prompts.  Our office hours are 8:00 a.m. and 4:30 p.m. Monday - Friday.  Please note that voicemails left after 4:00 p.m. may not be returned until the following business day.  We are closed weekends and major holidays.  You do have access to a nurse 24-7, just call the main number to the clinic [phone] and do not press any options, hold on the line and a nurse will answer the phone.    For prescription refill requests, have your pharmacy contact our office and allow 72 hours.    Due to Covid, you will need to wear a mask upon entering  the hospital. If you do not have a mask, a mask will be given to you at the Main Entrance upon arrival. For doctor visits, patients may have 1 support person age 18 or older with them. For treatment visits, patients can not have anyone with them due to social distancing guidelines and our immunocompromised population.      

## 2022-12-22 ENCOUNTER — Inpatient Hospital Stay: Payer: PPO | Admitting: Licensed Clinical Social Worker

## 2022-12-23 ENCOUNTER — Other Ambulatory Visit: Payer: Self-pay

## 2022-12-27 ENCOUNTER — Inpatient Hospital Stay: Payer: PPO

## 2022-12-27 ENCOUNTER — Other Ambulatory Visit: Payer: Self-pay

## 2022-12-27 ENCOUNTER — Inpatient Hospital Stay: Payer: PPO | Admitting: Hematology

## 2022-12-27 VITALS — BP 133/53 | HR 81 | Temp 98.3°F | Resp 18

## 2022-12-27 DIAGNOSIS — Z95828 Presence of other vascular implants and grafts: Secondary | ICD-10-CM

## 2022-12-27 DIAGNOSIS — Z5112 Encounter for antineoplastic immunotherapy: Secondary | ICD-10-CM | POA: Diagnosis not present

## 2022-12-27 DIAGNOSIS — Z170 Estrogen receptor positive status [ER+]: Secondary | ICD-10-CM

## 2022-12-27 LAB — CBC WITH DIFFERENTIAL/PLATELET
Abs Immature Granulocytes: 0.02 10*3/uL (ref 0.00–0.07)
Basophils Absolute: 0 10*3/uL (ref 0.0–0.1)
Basophils Relative: 1 %
Eosinophils Absolute: 0.2 10*3/uL (ref 0.0–0.5)
Eosinophils Relative: 4 %
HCT: 33.6 % — ABNORMAL LOW (ref 36.0–46.0)
Hemoglobin: 10.8 g/dL — ABNORMAL LOW (ref 12.0–15.0)
Immature Granulocytes: 0 %
Lymphocytes Relative: 24 %
Lymphs Abs: 1.2 10*3/uL (ref 0.7–4.0)
MCH: 29.6 pg (ref 26.0–34.0)
MCHC: 32.1 g/dL (ref 30.0–36.0)
MCV: 92.1 fL (ref 80.0–100.0)
Monocytes Absolute: 0.3 10*3/uL (ref 0.1–1.0)
Monocytes Relative: 6 %
Neutro Abs: 3.4 10*3/uL (ref 1.7–7.7)
Neutrophils Relative %: 65 %
Platelets: 259 10*3/uL (ref 150–400)
RBC: 3.65 MIL/uL — ABNORMAL LOW (ref 3.87–5.11)
RDW: 13.1 % (ref 11.5–15.5)
WBC: 5.1 10*3/uL (ref 4.0–10.5)
nRBC: 0 % (ref 0.0–0.2)

## 2022-12-27 LAB — COMPREHENSIVE METABOLIC PANEL
ALT: 19 U/L (ref 0–44)
AST: 15 U/L (ref 15–41)
Albumin: 3.2 g/dL — ABNORMAL LOW (ref 3.5–5.0)
Alkaline Phosphatase: 57 U/L (ref 38–126)
Anion gap: 6 (ref 5–15)
BUN: 27 mg/dL — ABNORMAL HIGH (ref 8–23)
CO2: 27 mmol/L (ref 22–32)
Calcium: 8.7 mg/dL — ABNORMAL LOW (ref 8.9–10.3)
Chloride: 106 mmol/L (ref 98–111)
Creatinine, Ser: 1.14 mg/dL — ABNORMAL HIGH (ref 0.44–1.00)
GFR, Estimated: 50 mL/min — ABNORMAL LOW (ref >60)
Glucose, Bld: 111 mg/dL — ABNORMAL HIGH (ref 70–99)
Potassium: 3.8 mmol/L (ref 3.5–5.1)
Sodium: 139 mmol/L (ref 135–145)
Total Bilirubin: 0.5 mg/dL (ref 0.3–1.2)
Total Protein: 5.7 g/dL — ABNORMAL LOW (ref 6.5–8.1)

## 2022-12-27 LAB — MAGNESIUM: Magnesium: 2 mg/dL (ref 1.7–2.4)

## 2022-12-27 MED ORDER — SODIUM CHLORIDE 0.9% FLUSH
10.0000 mL | Freq: Once | INTRAVENOUS | Status: AC
  Administered 2022-12-27: 10 mL via INTRAVENOUS

## 2022-12-27 MED ORDER — ACETAMINOPHEN 325 MG PO TABS
650.0000 mg | ORAL_TABLET | Freq: Once | ORAL | Status: AC
  Administered 2022-12-27: 650 mg via ORAL
  Filled 2022-12-27: qty 2

## 2022-12-27 MED ORDER — SODIUM CHLORIDE 0.9 % IV SOLN: Freq: Once | INTRAVENOUS | Status: AC

## 2022-12-27 MED ORDER — HEPARIN SOD (PORK) LOCK FLUSH 100 UNIT/ML IV SOLN
500.0000 [IU] | Freq: Once | INTRAVENOUS | Status: AC | PRN
  Administered 2022-12-27: 500 [IU]

## 2022-12-27 MED ORDER — SODIUM CHLORIDE 0.9 % IV SOLN
64.0000 mg/m2 | Freq: Once | INTRAVENOUS | Status: AC
  Administered 2022-12-27: 126 mg via INTRAVENOUS
  Filled 2022-12-27: qty 21

## 2022-12-27 MED ORDER — CETIRIZINE HCL 10 MG/ML IV SOLN
10.0000 mg | Freq: Once | INTRAVENOUS | Status: AC
  Administered 2022-12-27: 10 mg via INTRAVENOUS
  Filled 2022-12-27: qty 1

## 2022-12-27 MED ORDER — SODIUM CHLORIDE 0.9% FLUSH
10.0000 mL | INTRAVENOUS | Status: DC | PRN
  Administered 2022-12-27: 10 mL

## 2022-12-27 MED ORDER — TRASTUZUMAB-ANNS CHEMO 150 MG IV SOLR
2.0000 mg/kg | Freq: Once | INTRAVENOUS | Status: AC
  Administered 2022-12-27: 168 mg via INTRAVENOUS
  Filled 2022-12-27: qty 8

## 2022-12-27 MED ORDER — SODIUM CHLORIDE 0.9 % IV SOLN
10.0000 mg | Freq: Once | INTRAVENOUS | Status: AC
  Administered 2022-12-27: 10 mg via INTRAVENOUS
  Filled 2022-12-27: qty 10

## 2022-12-27 MED ORDER — FAMOTIDINE IN NACL 20-0.9 MG/50ML-% IV SOLN
20.0000 mg | Freq: Once | INTRAVENOUS | Status: AC
  Administered 2022-12-27: 20 mg via INTRAVENOUS
  Filled 2022-12-27: qty 50

## 2022-12-27 NOTE — Progress Notes (Signed)
Patients port flushed without difficulty.  Good blood return noted with no bruising or swelling noted at site.  Stable during access and blood draw.  Patient to remain accessed for treatment. 

## 2022-12-27 NOTE — Progress Notes (Signed)
Patient presents today for treatment. Taxol+Kanjiniti. Vital signs within parameters for treatment. Labs pending. Last echo 11-21-2022 LEF >75%  Labs within parameters for treatment. Patient has no complaints of any changes since her last treatment. MAR reviewed and updated.   Treatment given today per MD orders. Tolerated infusion without adverse affects. Vital signs stable. No complaints at this time. Discharged from clinic ambulatory in stable condition. Alert and oriented x 3. F/U with [facility] as scheduled.

## 2023-01-01 ENCOUNTER — Other Ambulatory Visit: Payer: Self-pay

## 2023-01-03 ENCOUNTER — Inpatient Hospital Stay (HOSPITAL_BASED_OUTPATIENT_CLINIC_OR_DEPARTMENT_OTHER): Payer: PPO | Admitting: Hematology

## 2023-01-03 ENCOUNTER — Inpatient Hospital Stay: Payer: PPO

## 2023-01-03 VITALS — BP 127/57 | HR 78 | Temp 98.6°F | Resp 18

## 2023-01-03 DIAGNOSIS — Z95828 Presence of other vascular implants and grafts: Secondary | ICD-10-CM

## 2023-01-03 DIAGNOSIS — C50412 Malignant neoplasm of upper-outer quadrant of left female breast: Secondary | ICD-10-CM

## 2023-01-03 DIAGNOSIS — Z5112 Encounter for antineoplastic immunotherapy: Secondary | ICD-10-CM | POA: Diagnosis not present

## 2023-01-03 DIAGNOSIS — Z79899 Other long term (current) drug therapy: Secondary | ICD-10-CM

## 2023-01-03 DIAGNOSIS — Z170 Estrogen receptor positive status [ER+]: Secondary | ICD-10-CM

## 2023-01-03 LAB — COMPREHENSIVE METABOLIC PANEL
ALT: 24 U/L (ref 0–44)
AST: 19 U/L (ref 15–41)
Albumin: 3.3 g/dL — ABNORMAL LOW (ref 3.5–5.0)
Alkaline Phosphatase: 65 U/L (ref 38–126)
Anion gap: 5 (ref 5–15)
BUN: 14 mg/dL (ref 8–23)
CO2: 27 mmol/L (ref 22–32)
Calcium: 8.5 mg/dL — ABNORMAL LOW (ref 8.9–10.3)
Chloride: 104 mmol/L (ref 98–111)
Creatinine, Ser: 1.14 mg/dL — ABNORMAL HIGH (ref 0.44–1.00)
GFR, Estimated: 50 mL/min — ABNORMAL LOW (ref >60)
Glucose, Bld: 113 mg/dL — ABNORMAL HIGH (ref 70–99)
Potassium: 3.7 mmol/L (ref 3.5–5.1)
Sodium: 136 mmol/L (ref 135–145)
Total Bilirubin: 0.4 mg/dL (ref 0.3–1.2)
Total Protein: 6.3 g/dL — ABNORMAL LOW (ref 6.5–8.1)

## 2023-01-03 LAB — CBC WITH DIFFERENTIAL/PLATELET
Abs Immature Granulocytes: 0.01 10*3/uL (ref 0.00–0.07)
Basophils Absolute: 0 10*3/uL (ref 0.0–0.1)
Basophils Relative: 1 %
Eosinophils Absolute: 0.2 10*3/uL (ref 0.0–0.5)
Eosinophils Relative: 4 %
HCT: 35.8 % — ABNORMAL LOW (ref 36.0–46.0)
Hemoglobin: 11.5 g/dL — ABNORMAL LOW (ref 12.0–15.0)
Immature Granulocytes: 0 %
Lymphocytes Relative: 25 %
Lymphs Abs: 1 10*3/uL (ref 0.7–4.0)
MCH: 29.4 pg (ref 26.0–34.0)
MCHC: 32.1 g/dL (ref 30.0–36.0)
MCV: 91.6 fL (ref 80.0–100.0)
Monocytes Absolute: 0.5 10*3/uL (ref 0.1–1.0)
Monocytes Relative: 13 %
Neutro Abs: 2.2 10*3/uL (ref 1.7–7.7)
Neutrophils Relative %: 57 %
Platelets: 295 10*3/uL (ref 150–400)
RBC: 3.91 MIL/uL (ref 3.87–5.11)
RDW: 13.6 % (ref 11.5–15.5)
WBC: 3.9 10*3/uL — ABNORMAL LOW (ref 4.0–10.5)
nRBC: 0 % (ref 0.0–0.2)

## 2023-01-03 LAB — MAGNESIUM: Magnesium: 2 mg/dL (ref 1.7–2.4)

## 2023-01-03 MED ORDER — TRASTUZUMAB-ANNS CHEMO 420 MG IV SOLR
2.0000 mg/kg | Freq: Once | INTRAVENOUS | Status: AC
  Administered 2023-01-03: 168 mg via INTRAVENOUS
  Filled 2023-01-03: qty 8

## 2023-01-03 MED ORDER — SODIUM CHLORIDE 0.9 % IV SOLN: Freq: Once | INTRAVENOUS | Status: AC

## 2023-01-03 MED ORDER — SODIUM CHLORIDE 0.9 % IV SOLN
64.0000 mg/m2 | Freq: Once | INTRAVENOUS | Status: AC
  Administered 2023-01-03: 126 mg via INTRAVENOUS
  Filled 2023-01-03: qty 21

## 2023-01-03 MED ORDER — CETIRIZINE HCL 10 MG/ML IV SOLN
10.0000 mg | Freq: Once | INTRAVENOUS | Status: AC
  Administered 2023-01-03: 10 mg via INTRAVENOUS
  Filled 2023-01-03: qty 1

## 2023-01-03 MED ORDER — HEPARIN SOD (PORK) LOCK FLUSH 100 UNIT/ML IV SOLN
500.0000 [IU] | Freq: Once | INTRAVENOUS | Status: AC | PRN
  Administered 2023-01-03: 500 [IU]

## 2023-01-03 MED ORDER — SODIUM CHLORIDE 0.9 % IV SOLN
10.0000 mg | Freq: Once | INTRAVENOUS | Status: AC
  Administered 2023-01-03: 10 mg via INTRAVENOUS
  Filled 2023-01-03: qty 10

## 2023-01-03 MED ORDER — SODIUM CHLORIDE 0.9% FLUSH
10.0000 mL | Freq: Once | INTRAVENOUS | Status: AC
  Administered 2023-01-03: 10 mL via INTRAVENOUS

## 2023-01-03 MED ORDER — ACETAMINOPHEN 325 MG PO TABS
650.0000 mg | ORAL_TABLET | Freq: Once | ORAL | Status: AC
  Administered 2023-01-03: 650 mg via ORAL
  Filled 2023-01-03: qty 2

## 2023-01-03 MED ORDER — SODIUM CHLORIDE 0.9% FLUSH
10.0000 mL | INTRAVENOUS | Status: DC | PRN
  Administered 2023-01-03: 10 mL

## 2023-01-03 MED ORDER — FAMOTIDINE IN NACL 20-0.9 MG/50ML-% IV SOLN
20.0000 mg | Freq: Once | INTRAVENOUS | Status: AC
  Administered 2023-01-03: 20 mg via INTRAVENOUS
  Filled 2023-01-03: qty 50

## 2023-01-03 NOTE — Patient Instructions (Signed)
[facility]  Discharge Instructions  You were seen and examined by Dr. Delton Coombes.  He reviewed the results of your lab work which are normal/stable.   We will proceed with your treatment today.   Return as scheduled.    Thank you for choosing [facility] to provide your oncology and hematology care.   To afford each patient quality time with our provider, please arrive at least 15 minutes before your scheduled appointment time. You may need to reschedule your appointment if you arrive late (10 or more minutes). Arriving late affects you and other patients whose appointments are after yours.  Also, if you miss three or more appointments without notifying the office, you may be dismissed from the clinic at the provider's discretion.    Again, thank you for choosing [facility]  Our hope is that these requests will decrease the amount of time that you wait before being seen by our physicians.   If you have a lab appointment with the [facility] please come in thru the Main Entrance and check in at the main information desk.           _____________________________________________________________  Should you have questions after your visit to [facility], please contact our office at [phone] and follow the prompts.  Our office hours are 8:00 a.m. to 4:30 p.m. Monday - Thursday and 8:00 a.m. to 2:30 p.m. Friday.  Please note that voicemails left after 4:00 p.m. may not be returned until the following business day.  We are closed weekends and all major holidays.  You do have access to a nurse 24-7, just call the main number to the clinic [phone] and do not press any options, hold on the line and a nurse will answer the phone.    For prescription refill requests, have your pharmacy contact our office and allow 72 hours.    Masks are optional in the cancer centers. If you would like for your care team  to wear a mask while they are taking care of you, please let them know. You may have one support person who is at least 77 years old accompany you for your appointments.

## 2023-01-03 NOTE — Progress Notes (Signed)
Patient has been examined by Dr. Katragadda, and vital signs and labs have been reviewed. ANC, Creatinine, LFTs, hemoglobin, and platelets are within treatment parameters per M.D. - pt may proceed with treatment.  Primary RN and pharmacy notified.  

## 2023-01-03 NOTE — Progress Notes (Signed)
Haley Peterson [address] [place] [address]   CLINIC:  Medical Oncology/Hematology  PCP:  Celene Squibb, MD [place] [address] [phone]   REASON FOR VISIT:  Follow-up for stage Ib HER2 positive left breast cancer  PRIOR THERAPY: 1.  Left mastectomy and SLNB by Dr. Donne Hazel on 10/31/2022  NGS Results: Not applicable  CURRENT THERAPY: Trastuzumab and Herceptin  BRIEF ONCOLOGIC HISTORY:  Oncology History  Breast cancer of upper-outer quadrant of left female breast ([place])  10/25/2022 Initial Diagnosis   Breast cancer of upper-outer quadrant of left female breast ([place])   12/13/2022 -  Chemotherapy   Patient is on Treatment Plan : BREAST Paclitaxel + Trastuzumab q7d / Trastuzumab q21d       CANCER STAGING:  Cancer Staging  Breast cancer of upper-outer quadrant of left female breast ([place]) Staging form: Breast, AJCC 8th Edition - Clinical stage from 10/25/2022: Stage IB (cT2, cN0, cM0, G2, ER+, PR+, HER2+) - Unsigned    INTERVAL HISTORY:  Haley Peterson 77 y.o. female seen for follow-up of her left breast cancer.  She has tolerated last cycle of paclitaxel and Herceptin reasonably well.  Energy levels are 100%.  Denies any diarrhea.  Denies any signs or symptoms of PND or orthopnea.  Has some upper respiratory symptoms with nasal congestion which is improving.  Headaches have also resolved.  Numbness in the feet has been stable.   REVIEW OF SYSTEMS:  Review of Systems  Neurological:  Positive for numbness (Feet stable).  All other systems reviewed and are negative.    PAST MEDICAL/SURGICAL HISTORY:  Past Medical History:  Diagnosis Date   Arthritis    Bone spur    left heel   Breast cancer ([place])    bilateral   Cancer ([place])    Bilateral Breast cancer   Cataract    right eye    CKD (chronic kidney disease), stage III (HCC)    mgd by PCP    History of breast cancer    left, right   Hypertension    Personal history of  radiation therapy    Pneumonia 10/2012   Port-A-Cath in place 12/06/2022   Past Surgical History:  Procedure Laterality Date   ABDOMINAL HYSTERECTOMY     BIOPSY  04/12/2017   Procedure: BIOPSY;  Surgeon: Rogene Houston, MD;  [facility];  Service: Endoscopy;;  ascending colon ulcer biopsies   BREAST BIOPSY Left 09/22/2022   [place] LT BREAST BX W LOC DEV EA ADD LESION IMG BX SPEC US GUIDE 09/22/2022 [facility]   BREAST BIOPSY Left 09/22/2022   [place] LT BREAST BX W LOC DEV 1ST LESION IMG BX [place] US GUIDE 09/22/2022 [facility]   BREAST LUMPECTOMY  08/28/2001   left   BREAST RECONSTRUCTION Right    implant   CHOLECYSTECTOMY  2002   COLONOSCOPY     COLONOSCOPY N/A 04/12/2017   Procedure: COLONOSCOPY;  Surgeon: Rogene Houston, MD;  [facility];  Service: Endoscopy;  Laterality: N/A;  830   MASTECTOMY  08/15/2006   right   PARTIAL HYSTERECTOMY  1985   PORTACATH PLACEMENT Right 10/31/2022   Procedure: INSERTION PORT-A-CATH;  Surgeon: [name], MD;  Location: [facility];  Service: General;  Laterality: Right;   REPLACEMENT TOTAL KNEE  2009, 2010   right-2010, left 2009   SIMPLE MASTECTOMY WITH AXILLARY SENTINEL NODE BIOPSY Left 10/31/2022   Procedure: LEFT MASTECTOMY;  Surgeon: [name], MD;  Location: [facility];  Service: General;  Laterality: Left;  90 MIN ROOM 2   TISSUE EXPANDER PLACEMENT     TOTAL HIP ARTHROPLASTY Right 10/09/2017   Procedure: RIGHT TOTAL HIP ARTHROPLASTY ANTERIOR APPROACH;  Surgeon: Frederik Pear, MD;  Location: [facility];  Service: Orthopedics;  Laterality: Right;   TOTAL HIP ARTHROPLASTY Left 09/23/2019   Procedure: Left Anterior Hip Arthroplasty;  Surgeon: Frederik Pear, MD;  Location: [facility];  Service: Orthopedics;  Laterality: Left;     SOCIAL HISTORY:  Social History   Socioeconomic History   Marital status: Divorced    Spouse name: Not on file   Number of children: Not on file    Years of education: Not on file   Highest education level: Not on file  Occupational History   Not on file  Tobacco Use   Smoking status: Former    Packs/day: 0.50    Years: 5.00    Total pack years: 2.50    Types: Cigarettes    Quit date: 09/28/1983    Years since quitting: 39.2   Smokeless tobacco: Never  Vaping Use   Vaping Use: Never used  Substance and Sexual Activity   Alcohol use: No   Drug use: No   Sexual activity: Not on file  Other Topics Concern   Not on file  Social History Narrative   Not on file   Social Determinants of Health   Financial Resource Strain: Not on file  Food Insecurity: Not on file  Transportation Needs: Not on file  Physical Activity: Not on file  Stress: Not on file  Social Connections: Not on file  Intimate Partner Violence: Not on file    FAMILY HISTORY:  Family History  Problem Relation Age of Onset   Arthritis Mother    Thyroid cancer Brother        2010   Colon cancer Neg Hx    Breast cancer Neg Hx     CURRENT MEDICATIONS:  Outpatient Encounter Medications as of 01/03/2023  Medication Sig Note   Ascorbic Acid (VITAMIN C) 1000 MG tablet Take 1,000 mg by mouth daily with lunch.    Calcium-Vitamin D (CALTRATE 600 PLUS-VIT D PO) Take 1 tablet by mouth daily with lunch.     Cholecalciferol (D-3-5) 125 MCG (5000 UT) capsule Take 5,000 Units by mouth daily.    cholecalciferol (VITAMIN D) 1000 UNITS tablet Take 1,000 Units by mouth daily with lunch.     Garlic (GARLIQUE PO) Take 1 tablet by mouth daily with lunch.    hydrochlorothiazide (HYDRODIURIL) 12.5 MG tablet Take 12.5 mg by mouth daily. 10/24/2022: Patient states she is not currently taking   losartan (COZAAR) 100 MG tablet Take 100 mg by mouth daily.    NON FORMULARY Balance of Nature - 3 fruits and 3 Vegetables    Omega-3 Fatty Acids (FISH OIL) 1200 MG CPDR Take by mouth.    PACLITAXEL IV Inject into the vein once a week.    TRASTUZUMAB IV Inject into the vein once a  week. Weekly x 12 weeks, then every 21 days    TURMERIC PO Take 3 capsules by mouth daily with lunch.    vitamin B-12 (CYANOCOBALAMIN) 100 MCG tablet Take 100 mcg by mouth daily.    lidocaine-prilocaine (EMLA) cream Apply a quarter sized amount to port a cath site and cover with plastic wrap 1 hour prior to infusion appointments (Patient not taking: Reported on 01/03/2023)  prochlorperazine (COMPAZINE) 10 MG tablet Take 1 tablet (10 mg total) by mouth every 6 (six) hours as needed for nausea or vomiting. (Patient not taking: Reported on 01/03/2023)    [EXPIRED] sodium chloride flush (NS) 0.9 % injection 10 mL     No facility-administered encounter medications on file as of 01/03/2023.    ALLERGIES:  Allergies  Allergen Reactions   Penicillins Other (See Comments)    "LOSS OF VISION"  Has patient had a PCN reaction causing immediate rash, facial/tongue/throat swelling, SOB or lightheadedness with hypotension: No Has patient had a PCN reaction causing severe rash involving mucus membranes or skin necrosis: No Has patient had a PCN reaction that required hospitalization: No Has patient had a PCN reaction occurring within the last 10 years: No If all of the above answers are "NO", then may proceed with Cephalosporin use.    Oxytetracycline Other (See Comments)    Fever blisters   Tamoxifen Other (See Comments)    "GENERAL ILLNESS WITH LIGHTHEADNESS"    Aromasin [Exemestane] Other (See Comments)    UNSPECIFIED REACTION  Pt is unsure of what reaction was...   Codeine Other (See Comments)    LIGHTHEADED     PHYSICAL EXAM:  ECOG Performance status: 1  There were no vitals filed for this visit.  There were no vitals filed for this visit.  Physical Exam Vitals reviewed.  Constitutional:      Appearance: Normal appearance.  Cardiovascular:     Rate and Rhythm: Normal rate and regular rhythm.     Heart sounds: Normal heart sounds.  Pulmonary:     Effort: Pulmonary effort is  normal.     Breath sounds: Normal breath sounds.  Neurological:     Mental Status: She is alert.  Psychiatric:        Mood and Affect: Mood normal.        Behavior: Behavior normal.      LABORATORY DATA:  I have reviewed the labs as listed.  CBC    Component Value Date/Time   WBC 3.9 (L) 01/03/2023 1021   RBC 3.91 01/03/2023 1021   HGB 11.5 (L) 01/03/2023 1021   HCT 35.8 (L) 01/03/2023 1021   PLT 295 01/03/2023 1021   MCV 91.6 01/03/2023 1021   MCH 29.4 01/03/2023 1021   MCHC 32.1 01/03/2023 1021   RDW 13.6 01/03/2023 1021   LYMPHSABS 1.0 01/03/2023 1021   MONOABS 0.5 01/03/2023 1021   EOSABS 0.2 01/03/2023 1021   BASOSABS 0.0 01/03/2023 1021      Latest Ref Rng & Units 01/03/2023   10:21 AM 12/27/2022    9:23 AM 12/20/2022   11:26 AM  CMP  Glucose 70 - 99 mg/dL 113  111  96   BUN 8 - 23 mg/dL 14  27  22   $ Creatinine 0.44 - 1.00 mg/dL 1.14  1.14  1.09   Sodium 135 - 145 mmol/L 136  139  137   Potassium 3.5 - 5.1 mmol/L 3.7  3.8  4.2   Chloride 98 - 111 mmol/L 104  106  104   CO2 22 - 32 mmol/L 27  27  26   $ Calcium 8.9 - 10.3 mg/dL 8.5  8.7  9.1   Total Protein 6.5 - 8.1 g/dL 6.3  5.7  6.4   Total Bilirubin 0.3 - 1.2 mg/dL 0.4  0.5  0.4   Alkaline Phos 38 - 126 U/L 65  57  63   AST 15 - 41 U/L 19  15  17   ALT 0 - 44 U/L 24  19  22     $ DIAGNOSTIC IMAGING:  I have independently reviewed the scans and discussed with the patient.  ASSESSMENT:  1.  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  In the 2:00 location 3 cm from the nipple, mass measures 0.8 x 0.9 x 0.9 cm.  Measured together, these lesions span 2.4 cm in the 2 o'clock position.  Left axilla is negative for adenopathy. - Biopsy (09/22/2022): 1. left breast 2:00, 1 cmfn-invasive ductal carcinoma,  grade 2, ER 100%, PR 95%, Ki-67 20%, HER2 2+ by IHC, HER2 FISH positive with ratio of 2.45 2.  Left breast 2:00 3 cmfn-invasive ductal carcinoma, grade 2, ER/PR 100%, Ki-67 15%, HER2 2+ by IHC, FISH negative - Left mastectomy, SLNB by Dr. Donne Hazel on 10/31/2022 - Pathology: 2.5 x 2.1 x 1.2 cm invasive moderately differentiated adenocarcinoma, grade 2, margins free, extensive lymphatic invasion.  0/1 lymph node positive.  Associated focal intermediate grade DCIS. - Adjuvant chemotherapy with weekly paclitaxel and Herceptin started on 12/13/2022   2.  Social/family history: - Lives at home by herself and is independent of ADLs and IADLs.  She worked as an Research officer, trade [employer].  Quit smoking 40 years ago. - Brother had thyroid cancer.   3.  Stage I right breast cancer: - Diagnosed on 05/15/2006, right breast 11 o'clock position - Right mastectomy (08/15/2006): 1.3 cm IDC, ER 83% positive, PR 84% positive, HER2 negative, Ki-67 4% - Tamoxifen followed by exemestane followed by letrozole for 10 years.   4.  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  Osteopenia: - Last bone density on 02/03/2021: T-score -1.9. - We will plan to obtain DEXA scan prior to initiation of AI.    PLAN:  1.  Stage Ia (T2 N0) HER2 positive left breast cancer: - She received week 3 of paclitaxel and Herceptin on 12/27/2022. - She had some looser stool but not watery. - Reviewed labs today which showed normal LFTs.  Creatinine is 1.14 and stable.  White count is slightly low at 3.9 but absolute neutrophil count is normal.  Platelet count is normal.  Hemoglobin 11.5.  Proceed with treatment today and in 1 week.  RTC 2 weeks for follow-up.  2.  High risk drug monitoring: - Echocardiogram on 11/21/2022 with LVEF 75%. - Will repeat echocardiogram in 3 months.  No clinical signs of PND or orthopnea at this time.  3.  Peripheral neuropathy: - Baseline tingling in the lower  legs and feet for many years is stable.  Will closely monitor while she is on paclitaxel.      Orders placed this encounter:  No orders of the defined types were placed in this encounter.     Derek Jack, MD [place] [phone]

## 2023-01-03 NOTE — Patient Instructions (Signed)
[facility]  Discharge Instructions: Thank you for choosing [facility] to provide your oncology and hematology care.  If you have a lab appointment with the [facility], please come in thru the Main Entrance and check in at the main information desk.  Wear comfortable clothing and clothing appropriate for easy access to any Portacath or PICC line.   We strive to give you quality time with your provider. You may need to reschedule your appointment if you arrive late (15 or more minutes).  Arriving late affects you and other patients whose appointments are after yours.  Also, if you miss three or more appointments without notifying the office, you may be dismissed from the clinic at the provider's discretion.      For prescription refill requests, have your pharmacy contact our office and allow 72 hours for refills to be completed.    Today you received the following chemotherapy and/or immunotherapy agents Kanjinti and Taxol   To help prevent nausea and vomiting after your treatment, we encourage you to take your nausea medication as directed.  Trastuzumab Injection What is this medication? TRASTUZUMAB (tras TOO zoo mab) treats breast cancer and stomach cancer. It works by blocking a protein that causes cancer cells to grow and multiply. This helps to slow or stop the spread of cancer cells. This medicine may be used for other purposes; ask your health care provider or pharmacist if you have questions. COMMON BRAND NAME(S): Herceptin, Janae Bridgeman, Ontruzant, Trazimera What should I tell my care team before I take this medication? They need to know if you have any of these conditions: Heart failure Lung disease An unusual or allergic reaction to trastuzumab, other medications, foods, dyes, or preservatives Pregnant or trying to get pregnant Breast-feeding How should I use this medication? This medication is injected into a vein. It is  given by your care team in a hospital or clinic setting. Talk to your care team about the use of this medication in children. It is not approved for use in children. Overdosage: If you think you have taken too much of this medicine contact a poison control center or emergency room at once. NOTE: This medicine is only for you. Do not share this medicine with others. What if I miss a dose? Keep appointments for follow-up doses. It is important not to miss your dose. Call your care team if you are unable to keep an appointment. What may interact with this medication? Certain types of chemotherapy, such as daunorubicin, doxorubicin, epirubicin, idarubicin This list may not describe all possible interactions. Give your health care provider a list of all the medicines, herbs, non-prescription drugs, or dietary supplements you use. Also tell them if you smoke, drink alcohol, or use illegal drugs. Some items may interact with your medicine. What should I watch for while using this medication? Your condition will be monitored carefully while you are receiving this medication. This medication may make you feel generally unwell. This is not uncommon, as chemotherapy affects healthy cells as well as cancer cells. Report any side effects. Continue your course of treatment even though you feel ill unless your care team tells you to stop. This medication may increase your risk of getting an infection. Call your care team for advice if you get a fever, chills, sore throat, or other symptoms of a cold or flu. Do not treat yourself. Try to avoid being around people who are sick. Avoid taking medications that contain aspirin,  acetaminophen, ibuprofen, naproxen, or ketoprofen unless instructed by your care team. These medications can hide a fever. Talk to your care team if you may be pregnant. Serious birth defects can occur if you take this medication during pregnancy and for 7 months after the last dose. You will need a  negative pregnancy test before starting this medication. Contraception is recommended while taking this medication and for 7 months after the last dose. Your care team can help you find the option that works for you. Do not breastfeed while taking this medication and for 7 months after stopping treatment. What side effects may I notice from receiving this medication? Side effects that you should report to your care team as soon as possible: Allergic reactions or angioedema--skin rash, itching or hives, swelling of the face, eyes, lips, tongue, arms, or legs, trouble swallowing or breathing Dry cough, shortness of breath or trouble breathing Heart failure--shortness of breath, swelling of the ankles, feet, or hands, sudden weight gain, unusual weakness or fatigue Infection--fever, chills, cough, or sore throat Infusion reactions--chest pain, shortness of breath or trouble breathing, feeling faint or lightheaded Side effects that usually do not require medical attention (report to your care team if they continue or are bothersome): Diarrhea Dizziness Headache Nausea Trouble sleeping Vomiting This list may not describe all possible side effects. Call your doctor for medical advice about side effects. You may report side effects to FDA at 1-800-FDA-1088. Where should I keep my medication? This medication is given in a hospital or clinic. It will not be stored at home. NOTE: This sheet is a summary. It may not cover all possible information. If you have questions about this medicine, talk to your doctor, pharmacist, or health care provider.  2023 Elsevier/Gold Standard (2022-03-03 00:00:00)   Paclitaxel Injection What is this medication? PACLITAXEL (PAK li TAX el) treats some types of cancer. It works by slowing down the growth of cancer cells. This medicine may be used for other purposes; ask your health care provider or pharmacist if you have questions. COMMON BRAND NAME(S): Onxol,  Taxol What should I tell my care team before I take this medication? They need to know if you have any of these conditions: Heart disease Liver disease Low white blood cell levels An unusual or allergic reaction to paclitaxel, other medications, foods, dyes, or preservatives If you or your partner are pregnant or trying to get pregnant Breast-feeding How should I use this medication? This medication is injected into a vein. It is given by your care team in a hospital or clinic setting. Talk to your care team about the use of this medication in children. While it may be given to children for selected conditions, precautions do apply. Overdosage: If you think you have taken too much of this medicine contact a poison control center or emergency room at once. NOTE: This medicine is only for you. Do not share this medicine with others. What if I miss a dose? Keep appointments for follow-up doses. It is important not to miss your dose. Call your care team if you are unable to keep an appointment. What may interact with this medication? Do not take this medication with any of the following: Live virus vaccines Other medications may affect the way this medication works. Talk with your care team about all of the medications you take. They may suggest changes to your treatment plan to lower the risk of side effects and to make sure your medications work as intended. This list may  not describe all possible interactions. Give your health care provider a list of all the medicines, herbs, non-prescription drugs, or dietary supplements you use. Also tell them if you smoke, drink alcohol, or use illegal drugs. Some items may interact with your medicine. What should I watch for while using this medication? Your condition will be monitored carefully while you are receiving this medication. You may need blood work while taking this medication. This medication may make you feel generally unwell. This is not  uncommon as chemotherapy can affect healthy cells as well as cancer cells. Report any side effects. Continue your course of treatment even though you feel ill unless your care team tells you to stop. This medication can cause serious allergic reactions. To reduce the risk, your care team may give you other medications to take before receiving this one. Be sure to follow the directions from your care team. This medication may increase your risk of getting an infection. Call your care team for advice if you get a fever, chills, sore throat, or other symptoms of a cold or flu. Do not treat yourself. Try to avoid being around people who are sick. This medication may increase your risk to bruise or bleed. Call your care team if you notice any unusual bleeding. Be careful brushing or flossing your teeth or using a toothpick because you may get an infection or bleed more easily. If you have any dental work done, tell your dentist you are receiving this medication. Talk to your care team if you may be pregnant. Serious birth defects can occur if you take this medication during pregnancy. Talk to your care team before breastfeeding. Changes to your treatment plan may be needed. What side effects may I notice from receiving this medication? Side effects that you should report to your care team as soon as possible: Allergic reactions--skin rash, itching, hives, swelling of the face, lips, tongue, or throat Heart rhythm changes--fast or irregular heartbeat, dizziness, feeling faint or lightheaded, chest pain, trouble breathing Increase in blood pressure Infection--fever, chills, cough, sore throat, wounds that don't heal, pain or trouble when passing urine, general feeling of discomfort or being unwell Low blood pressure--dizziness, feeling faint or lightheaded, blurry vision Low red blood cell level--unusual weakness or fatigue, dizziness, headache, trouble breathing Painful swelling, warmth, or redness of the  skin, blisters or sores at the infusion site Pain, tingling, or numbness in the hands or feet Slow heartbeat--dizziness, feeling faint or lightheaded, confusion, trouble breathing, unusual weakness or fatigue Unusual bruising or bleeding Side effects that usually do not require medical attention (report to your care team if they continue or are bothersome): Diarrhea Hair loss Joint pain Loss of appetite Muscle pain Nausea Vomiting This list may not describe all possible side effects. Call your doctor for medical advice about side effects. You may report side effects to FDA at 1-800-FDA-1088. Where should I keep my medication? This medication is given in a hospital or clinic. It will not be stored at home. NOTE: This sheet is a summary. It may not cover all possible information. If you have questions about this medicine, talk to your doctor, pharmacist, or health care provider.  2023 Elsevier/Gold Standard (2022-03-02 00:00:00)   BELOW ARE SYMPTOMS THAT SHOULD BE REPORTED IMMEDIATELY: *FEVER GREATER THAN 100.4 F (38 C) OR HIGHER *CHILLS OR SWEATING *NAUSEA AND VOMITING THAT IS NOT CONTROLLED WITH YOUR NAUSEA MEDICATION *UNUSUAL SHORTNESS OF BREATH *UNUSUAL BRUISING OR BLEEDING *URINARY PROBLEMS (pain or burning when urinating, or frequent urination) *  BOWEL PROBLEMS (unusual diarrhea, constipation, pain near the anus) TENDERNESS IN MOUTH AND THROAT WITH OR WITHOUT PRESENCE OF ULCERS (sore throat, sores in mouth, or a toothache) UNUSUAL RASH, SWELLING OR PAIN  UNUSUAL VAGINAL DISCHARGE OR ITCHING   Items with * indicate a potential emergency and should be followed up as soon as possible or go to the Emergency Department if any problems should occur.  Please show the CHEMOTHERAPY ALERT CARD or IMMUNOTHERAPY ALERT CARD at check-in to the Emergency Department and triage nurse.  Should you have questions after your visit or need to cancel or reschedule your appointment, please contact  [facility] [phone]  and follow the prompts.  Office hours are 8:00 a.m. to 4:30 p.m. Monday - Friday. Please note that voicemails left after 4:00 p.m. may not be returned until the following business day.  We are closed weekends and major holidays. You have access to a nurse at all times for urgent questions. Please call the main number to the clinic [phone] and follow the prompts.  For any non-urgent questions, you may also contact your provider using MyChart. We now offer e-Visits for anyone 65 and older to request care online for non-urgent symptoms. For details visit [email or web address].   Also download the MyChart app! Go to the app store, search "MyChart", open the app, select [facility], and log in with your MyChart username and password.

## 2023-01-03 NOTE — Progress Notes (Signed)
Pt presents today for Kanjinti and Taxol per provider's order. Vital signs and labs WNL for treatment. Okay to proceed with treatment today per Dr.K.  Treatment given today per MD orders. Tolerated infusion without adverse affects. Vital signs stable. No complaints at this time. Discharged from clinic ambulatory in stable condition. Alert and oriented x 3. F/U with [facility] as scheduled.

## 2023-01-07 ENCOUNTER — Other Ambulatory Visit: Payer: Self-pay

## 2023-01-10 ENCOUNTER — Inpatient Hospital Stay: Payer: PPO | Admitting: Hematology

## 2023-01-10 ENCOUNTER — Inpatient Hospital Stay: Payer: PPO

## 2023-01-10 ENCOUNTER — Encounter: Payer: Self-pay | Admitting: Hematology

## 2023-01-10 VITALS — BP 143/67 | HR 79 | Temp 98.2°F | Resp 20

## 2023-01-10 DIAGNOSIS — Z170 Estrogen receptor positive status [ER+]: Secondary | ICD-10-CM

## 2023-01-10 DIAGNOSIS — Z95828 Presence of other vascular implants and grafts: Secondary | ICD-10-CM

## 2023-01-10 DIAGNOSIS — Z5112 Encounter for antineoplastic immunotherapy: Secondary | ICD-10-CM | POA: Diagnosis not present

## 2023-01-10 LAB — CBC WITH DIFFERENTIAL/PLATELET
Abs Immature Granulocytes: 0.06 10*3/uL (ref 0.00–0.07)
Basophils Absolute: 0.1 10*3/uL (ref 0.0–0.1)
Basophils Relative: 1 %
Eosinophils Absolute: 0.3 10*3/uL (ref 0.0–0.5)
Eosinophils Relative: 4 %
HCT: 34.3 % — ABNORMAL LOW (ref 36.0–46.0)
Hemoglobin: 11.1 g/dL — ABNORMAL LOW (ref 12.0–15.0)
Immature Granulocytes: 1 %
Lymphocytes Relative: 23 %
Lymphs Abs: 1.7 10*3/uL (ref 0.7–4.0)
MCH: 29.5 pg (ref 26.0–34.0)
MCHC: 32.4 g/dL (ref 30.0–36.0)
MCV: 91.2 fL (ref 80.0–100.0)
Monocytes Absolute: 0.7 10*3/uL (ref 0.1–1.0)
Monocytes Relative: 10 %
Neutro Abs: 4.5 10*3/uL (ref 1.7–7.7)
Neutrophils Relative %: 61 %
Platelets: 410 10*3/uL — ABNORMAL HIGH (ref 150–400)
RBC: 3.76 MIL/uL — ABNORMAL LOW (ref 3.87–5.11)
RDW: 13.6 % (ref 11.5–15.5)
WBC: 7.4 10*3/uL (ref 4.0–10.5)
nRBC: 0 % (ref 0.0–0.2)

## 2023-01-10 LAB — MAGNESIUM: Magnesium: 2 mg/dL (ref 1.7–2.4)

## 2023-01-10 LAB — COMPREHENSIVE METABOLIC PANEL WITH GFR
ALT: 23 U/L (ref 0–44)
AST: 17 U/L (ref 15–41)
Albumin: 3.1 g/dL — ABNORMAL LOW (ref 3.5–5.0)
Alkaline Phosphatase: 66 U/L (ref 38–126)
Anion gap: 8 (ref 5–15)
BUN: 16 mg/dL (ref 8–23)
CO2: 26 mmol/L (ref 22–32)
Calcium: 9 mg/dL (ref 8.9–10.3)
Chloride: 104 mmol/L (ref 98–111)
Creatinine, Ser: 1.17 mg/dL — ABNORMAL HIGH (ref 0.44–1.00)
GFR, Estimated: 48 mL/min — ABNORMAL LOW
Glucose, Bld: 101 mg/dL — ABNORMAL HIGH (ref 70–99)
Potassium: 3.9 mmol/L (ref 3.5–5.1)
Sodium: 138 mmol/L (ref 135–145)
Total Bilirubin: 0.4 mg/dL (ref 0.3–1.2)
Total Protein: 6 g/dL — ABNORMAL LOW (ref 6.5–8.1)

## 2023-01-10 MED ORDER — FAMOTIDINE IN NACL 20-0.9 MG/50ML-% IV SOLN
20.0000 mg | Freq: Once | INTRAVENOUS | Status: AC
  Administered 2023-01-10: 20 mg via INTRAVENOUS
  Filled 2023-01-10: qty 50

## 2023-01-10 MED ORDER — SODIUM CHLORIDE 0.9 % IV SOLN
10.0000 mg | Freq: Once | INTRAVENOUS | Status: AC
  Administered 2023-01-10: 10 mg via INTRAVENOUS
  Filled 2023-01-10: qty 10

## 2023-01-10 MED ORDER — TRASTUZUMAB-ANNS CHEMO 420 MG IV SOLR
2.0000 mg/kg | Freq: Once | INTRAVENOUS | Status: AC
  Administered 2023-01-10: 168 mg via INTRAVENOUS
  Filled 2023-01-10: qty 8

## 2023-01-10 MED ORDER — SODIUM CHLORIDE 0.9 % IV SOLN: Freq: Once | INTRAVENOUS | Status: AC

## 2023-01-10 MED ORDER — HEPARIN SOD (PORK) LOCK FLUSH 100 UNIT/ML IV SOLN
500.0000 [IU] | Freq: Once | INTRAVENOUS | Status: AC | PRN
  Administered 2023-01-10: 500 [IU]

## 2023-01-10 MED ORDER — SODIUM CHLORIDE 0.9 % IV SOLN
64.0000 mg/m2 | Freq: Once | INTRAVENOUS | Status: AC
  Administered 2023-01-10: 126 mg via INTRAVENOUS
  Filled 2023-01-10: qty 21

## 2023-01-10 MED ORDER — SODIUM CHLORIDE 0.9% FLUSH
10.0000 mL | Freq: Once | INTRAVENOUS | Status: AC
  Administered 2023-01-10: 10 mL via INTRAVENOUS

## 2023-01-10 MED ORDER — SODIUM CHLORIDE 0.9% FLUSH
10.0000 mL | INTRAVENOUS | Status: DC | PRN
  Administered 2023-01-10: 10 mL

## 2023-01-10 MED ORDER — ACETAMINOPHEN 325 MG PO TABS
650.0000 mg | ORAL_TABLET | Freq: Once | ORAL | Status: AC
  Administered 2023-01-10: 650 mg via ORAL
  Filled 2023-01-10: qty 2

## 2023-01-10 MED ORDER — CETIRIZINE HCL 10 MG/ML IV SOLN
10.0000 mg | Freq: Once | INTRAVENOUS | Status: AC
  Administered 2023-01-10: 10 mg via INTRAVENOUS
  Filled 2023-01-10: qty 1

## 2023-01-10 NOTE — Progress Notes (Signed)
Pt presents today for Kanjinti and Taxol per provider's order. Vital signs and labs WNL for treatment. Pt c/o yellow drainage when she blows her nose first thing in the morning and it clears off throughout the day. She also is c/o itching and redness on bilateral wrists and hands Dr.K made aware and stated pt can take Benadryl 25 mg p.o for itching and hydrocortisone as needed for the rash and stated pt should call the clinic if secretions become green. Pt made aware and verbalized understanding. Okay to proceed with treatment today per Dr.K.  Calla Kicks and Taxol given today per MD orders. Tolerated infusion without adverse affects. Vital signs stable. No complaints at this time. Discharged from clinic ambulatory in stable condition. Alert and oriented x 3. F/U with [facility] as scheduled.

## 2023-01-10 NOTE — Patient Instructions (Signed)
Register  Discharge Instructions: Thank you for choosing [facility] to provide your oncology and hematology care.  If you have a lab appointment with the [facility], please come in thru the Main Entrance and check in at the main information desk.  Wear comfortable clothing and clothing appropriate for easy access to any Portacath or PICC line.   We strive to give you quality time with your provider. You may need to reschedule your appointment if you arrive late (15 or more minutes).  Arriving late affects you and other patients whose appointments are after yours.  Also, if you miss three or more appointments without notifying the office, you may be dismissed from the clinic at the provider's discretion.      For prescription refill requests, have your pharmacy contact our office and allow 72 hours for refills to be completed.    Today you received the following chemotherapy and/or immunotherapy agents Kanjinti and Taxol   To help prevent nausea and vomiting after your treatment, we encourage you to take your nausea medication as directed.  Trastuzumab Injection What is this medication? TRASTUZUMAB (tras TOO zoo mab) treats breast cancer and stomach cancer. It works by blocking a protein that causes cancer cells to grow and multiply. This helps to slow or stop the spread of cancer cells. This medicine may be used for other purposes; ask your health care provider or pharmacist if you have questions. COMMON BRAND NAME(S): Herceptin, Janae Bridgeman, Ontruzant, Trazimera What should I tell my care team before I take this medication? They need to know if you have any of these conditions: Heart failure Lung disease An unusual or allergic reaction to trastuzumab, other medications, foods, dyes, or preservatives Pregnant or trying to get pregnant Breast-feeding How should I use this medication? This medication is injected into a vein. It is  given by your care team in a hospital or clinic setting. Talk to your care team about the use of this medication in children. It is not approved for use in children. Overdosage: If you think you have taken too much of this medicine contact a poison control center or emergency room at once. NOTE: This medicine is only for you. Do not share this medicine with others. What if I miss a dose? Keep appointments for follow-up doses. It is important not to miss your dose. Call your care team if you are unable to keep an appointment. What may interact with this medication? Certain types of chemotherapy, such as daunorubicin, doxorubicin, epirubicin, idarubicin This list may not describe all possible interactions. Give your health care provider a list of all the medicines, herbs, non-prescription drugs, or dietary supplements you use. Also tell them if you smoke, drink alcohol, or use illegal drugs. Some items may interact with your medicine. What should I watch for while using this medication? Your condition will be monitored carefully while you are receiving this medication. This medication may make you feel generally unwell. This is not uncommon, as chemotherapy affects healthy cells as well as cancer cells. Report any side effects. Continue your course of treatment even though you feel ill unless your care team tells you to stop. This medication may increase your risk of getting an infection. Call your care team for advice if you get a fever, chills, sore throat, or other symptoms of a cold or flu. Do not treat yourself. Try to avoid being around people who are sick. Avoid taking medications that contain aspirin,  acetaminophen, ibuprofen, naproxen, or ketoprofen unless instructed by your care team. These medications can hide a fever. Talk to your care team if you may be pregnant. Serious birth defects can occur if you take this medication during pregnancy and for 7 months after the last dose. You will need a  negative pregnancy test before starting this medication. Contraception is recommended while taking this medication and for 7 months after the last dose. Your care team can help you find the option that works for you. Do not breastfeed while taking this medication and for 7 months after stopping treatment. What side effects may I notice from receiving this medication? Side effects that you should report to your care team as soon as possible: Allergic reactions or angioedema--skin rash, itching or hives, swelling of the face, eyes, lips, tongue, arms, or legs, trouble swallowing or breathing Dry cough, shortness of breath or trouble breathing Heart failure--shortness of breath, swelling of the ankles, feet, or hands, sudden weight gain, unusual weakness or fatigue Infection--fever, chills, cough, or sore throat Infusion reactions--chest pain, shortness of breath or trouble breathing, feeling faint or lightheaded Side effects that usually do not require medical attention (report to your care team if they continue or are bothersome): Diarrhea Dizziness Headache Nausea Trouble sleeping Vomiting This list may not describe all possible side effects. Call your doctor for medical advice about side effects. You may report side effects to FDA at 1-800-FDA-1088. Where should I keep my medication? This medication is given in a hospital or clinic. It will not be stored at home. NOTE: This sheet is a summary. It may not cover all possible information. If you have questions about this medicine, talk to your doctor, pharmacist, or health care provider.  2023 Elsevier/Gold Standard (2022-03-03 00:00:00)  Paclitaxel Injection What is this medication? PACLITAXEL (PAK li TAX el) treats some types of cancer. It works by slowing down the growth of cancer cells. This medicine may be used for other purposes; ask your health care provider or pharmacist if you have questions. COMMON BRAND NAME(S): Onxol, Taxol What  should I tell my care team before I take this medication? They need to know if you have any of these conditions: Heart disease Liver disease Low white blood cell levels An unusual or allergic reaction to paclitaxel, other medications, foods, dyes, or preservatives If you or your partner are pregnant or trying to get pregnant Breast-feeding How should I use this medication? This medication is injected into a vein. It is given by your care team in a hospital or clinic setting. Talk to your care team about the use of this medication in children. While it may be given to children for selected conditions, precautions do apply. Overdosage: If you think you have taken too much of this medicine contact a poison control center or emergency room at once. NOTE: This medicine is only for you. Do not share this medicine with others. What if I miss a dose? Keep appointments for follow-up doses. It is important not to miss your dose. Call your care team if you are unable to keep an appointment. What may interact with this medication? Do not take this medication with any of the following: Live virus vaccines Other medications may affect the way this medication works. Talk with your care team about all of the medications you take. They may suggest changes to your treatment plan to lower the risk of side effects and to make sure your medications work as intended. This list may not  describe all possible interactions. Give your health care provider a list of all the medicines, herbs, non-prescription drugs, or dietary supplements you use. Also tell them if you smoke, drink alcohol, or use illegal drugs. Some items may interact with your medicine. What should I watch for while using this medication? Your condition will be monitored carefully while you are receiving this medication. You may need blood work while taking this medication. This medication may make you feel generally unwell. This is not uncommon as  chemotherapy can affect healthy cells as well as cancer cells. Report any side effects. Continue your course of treatment even though you feel ill unless your care team tells you to stop. This medication can cause serious allergic reactions. To reduce the risk, your care team may give you other medications to take before receiving this one. Be sure to follow the directions from your care team. This medication may increase your risk of getting an infection. Call your care team for advice if you get a fever, chills, sore throat, or other symptoms of a cold or flu. Do not treat yourself. Try to avoid being around people who are sick. This medication may increase your risk to bruise or bleed. Call your care team if you notice any unusual bleeding. Be careful brushing or flossing your teeth or using a toothpick because you may get an infection or bleed more easily. If you have any dental work done, tell your dentist you are receiving this medication. Talk to your care team if you may be pregnant. Serious birth defects can occur if you take this medication during pregnancy. Talk to your care team before breastfeeding. Changes to your treatment plan may be needed. What side effects may I notice from receiving this medication? Side effects that you should report to your care team as soon as possible: Allergic reactions--skin rash, itching, hives, swelling of the face, lips, tongue, or throat Heart rhythm changes--fast or irregular heartbeat, dizziness, feeling faint or lightheaded, chest pain, trouble breathing Increase in blood pressure Infection--fever, chills, cough, sore throat, wounds that don't heal, pain or trouble when passing urine, general feeling of discomfort or being unwell Low blood pressure--dizziness, feeling faint or lightheaded, blurry vision Low red blood cell level--unusual weakness or fatigue, dizziness, headache, trouble breathing Painful swelling, warmth, or redness of the skin,  blisters or sores at the infusion site Pain, tingling, or numbness in the hands or feet Slow heartbeat--dizziness, feeling faint or lightheaded, confusion, trouble breathing, unusual weakness or fatigue Unusual bruising or bleeding Side effects that usually do not require medical attention (report to your care team if they continue or are bothersome): Diarrhea Hair loss Joint pain Loss of appetite Muscle pain Nausea Vomiting This list may not describe all possible side effects. Call your doctor for medical advice about side effects. You may report side effects to FDA at 1-800-FDA-1088. Where should I keep my medication? This medication is given in a hospital or clinic. It will not be stored at home. NOTE: This sheet is a summary. It may not cover all possible information. If you have questions about this medicine, talk to your doctor, pharmacist, or health care provider.  2023 Elsevier/Gold Standard (2022-03-17 00:00:00)    BELOW ARE SYMPTOMS THAT SHOULD BE REPORTED IMMEDIATELY: *FEVER GREATER THAN 100.4 F (38 C) OR HIGHER *CHILLS OR SWEATING *NAUSEA AND VOMITING THAT IS NOT CONTROLLED WITH YOUR NAUSEA MEDICATION *UNUSUAL SHORTNESS OF BREATH *UNUSUAL BRUISING OR BLEEDING *URINARY PROBLEMS (pain or burning when urinating, or frequent urination) *  BOWEL PROBLEMS (unusual diarrhea, constipation, pain near the anus) TENDERNESS IN MOUTH AND THROAT WITH OR WITHOUT PRESENCE OF ULCERS (sore throat, sores in mouth, or a toothache) UNUSUAL RASH, SWELLING OR PAIN  UNUSUAL VAGINAL DISCHARGE OR ITCHING   Items with * indicate a potential emergency and should be followed up as soon as possible or go to the Emergency Department if any problems should occur.  Please show the CHEMOTHERAPY ALERT CARD or IMMUNOTHERAPY ALERT CARD at check-in to the Emergency Department and triage nurse.  Should you have questions after your visit or need to cancel or reschedule your appointment, please contact  [facility] [phone]  and follow the prompts.  Office hours are 8:00 a.m. to 4:30 p.m. Monday - Friday. Please note that voicemails left after 4:00 p.m. may not be returned until the following business day.  We are closed weekends and major holidays. You have access to a nurse at all times for urgent questions. Please call the main number to the clinic [phone] and follow the prompts.  For any non-urgent questions, you may also contact your provider using MyChart. We now offer e-Visits for anyone 42 and older to request care online for non-urgent symptoms. For details visit [email or web address].   Also download the MyChart app! Go to the app store, search "MyChart", open the app, select [facility], and log in with your MyChart username and password.

## 2023-01-12 ENCOUNTER — Other Ambulatory Visit: Payer: Self-pay

## 2023-01-12 DIAGNOSIS — R7301 Impaired fasting glucose: Secondary | ICD-10-CM | POA: Diagnosis not present

## 2023-01-12 DIAGNOSIS — E782 Mixed hyperlipidemia: Secondary | ICD-10-CM | POA: Diagnosis not present

## 2023-01-16 NOTE — Progress Notes (Signed)
[facility] [address] [place] [address]    Clinic Day:  01/17/2023  Referring physician: Celene Squibb, MD  Patient Care Team: Celene Squibb, MD as PCP - General (Internal Medicine) Bonne Dolores, MD (Inactive) (Internal Medicine) Neldon Mc, MD (Inactive) as Surgeon (General Surgery) Derek Jack, MD as Medical Oncologist (Medical Oncology) Brien Mates, RN as Oncology Nurse Navigator (Medical Oncology)   ASSESSMENT & PLAN:   Assessment: 1.  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  In the 2:00 location 3 cm from the nipple, mass measures 0.8 x 0.9 x 0.9 cm.  Measured together, these lesions span 2.4 cm in the 2 o'clock position.  Left axilla is negative for adenopathy. - Biopsy (09/22/2022): 1. left breast 2:00, 1 cmfn-invasive ductal carcinoma, grade 2, ER 100%, PR 95%, Ki-67 20%, HER2 2+ by IHC, HER2 FISH positive with ratio of 2.45 2.  Left breast 2:00 3 cmfn-invasive ductal carcinoma, grade 2, ER/PR 100%, Ki-67 15%, HER2 2+ by IHC, FISH negative - Left mastectomy, SLNB by Dr. Donne Hazel on 10/31/2022 - Pathology: 2.5 x 2.1 x 1.2 cm invasive moderately differentiated adenocarcinoma, grade 2, margins free, extensive lymphatic invasion.  0/1 lymph node positive.  Associated focal intermediate grade DCIS. - Adjuvant chemotherapy with weekly paclitaxel and Herceptin started on 12/13/2022   2.  Social/family history: - Lives at home by herself and is independent of ADLs and IADLs.  She worked as an Research officer, trade [employer].  Quit smoking 40 years ago. - Brother had thyroid cancer.   3.  Stage I right breast cancer: - Diagnosed on 05/15/2006, right breast 11 o'clock position - Right mastectomy (08/15/2006): 1.3  cm IDC, ER 83% positive, PR 84% positive, HER2 negative, Ki-67 4% - Tamoxifen followed by exemestane followed by letrozole for 10 years.   4.  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  Osteopenia: - Last bone density on 02/03/2021: T-score -1.9. - We will plan to obtain DEXA scan prior to initiation of AI.  Plan: 1.  Stage Ia (T2 N0) HER2 positive left breast cancer: - She has received 5 weekly dose of paclitaxel and Herceptin.  She did not report any diarrhea. - Reviewed labs which showed normal LFTs and electrolytes.  CBC was grossly normal. - Proceed with week 6 today.  RTC 3 weeks for follow-up.  Use Maalox as needed for heartburn.   2.  High risk drug monitoring: - 2D echo on 11/21/2022 with LVEF 75%. - No clinical signs of PND or orthopnea.  Will repeat echo prior to next visit.   3.  Peripheral neuropathy: - Baseline tingling in the lower legs and feet is stable.  Will closely monitor.  Orders Placed This Encounter  Procedures   Magnesium    Standing Status:   Future    Standing Expiration Date:   04/17/2024   Magnesium    Standing Status:   Future    Standing Expiration Date:   05/08/2024   ECHOCARDIOGRAM COMPLETE    Standing Status:   Future    Standing Expiration Date:   01/17/2024    Order Specific Question:   Where should this test be performed    Answer:   [facility]    Order Specific Question:   Perflutren DEFINITY (image enhancing agent) should be administered unless hypersensitivity or allergy exist    Answer:   Administer Perflutren    Order Specific Question:   Is a special reader required? (athlete or structural heart)    Answer:   No    Order Specific Question:   Does this study need to be read by the Structural team/Level 3 readers?    Answer:   No    Order Specific Question:   Reason for exam-Echo    Answer:   Chemo  Z09      I,Alexis Herring,acting as a scribe for Derek Jack, MD.,have documented all relevant  documentation on the behalf of Derek Jack, MD,as directed by  Derek Jack, MD while in the presence of Derek Jack, MD.   I, Derek Jack MD, have reviewed the above documentation for accuracy and completeness, and I agree with the above.   Derek Jack, MD   3/5/20245:59 PM  CHIEF COMPLAINT:   Diagnosis: stage Ib HER2 positive left breast cancer    Cancer Staging  Breast cancer of upper-outer quadrant of left female breast [facility]) Staging form: Breast, AJCC 8th Edition - Clinical stage from 10/25/2022: Stage IB (cT2, cN0, cM0, G2, ER+, PR+, HER2+) - Unsigned    Prior Therapy: Left mastectomy and SLNB by Dr. Donne Hazel on 10/31/2022   Current Therapy:  Trastuzumab and Herceptin    HISTORY OF PRESENT ILLNESS:   Oncology History  Breast cancer of upper-outer quadrant of left female breast ([place])  10/25/2022 Initial Diagnosis   Breast cancer of upper-outer quadrant of left female breast ([place])   12/13/2022 -  Chemotherapy   Patient is on Treatment Plan : BREAST Paclitaxel + Trastuzumab q7d / Trastuzumab q21d        INTERVAL HISTORY:   Haley Peterson is a 77 y.o. female presenting to clinic today for follow up of stage Ib HER2 positive left breast cancer. She was last seen by me on 01/03/23.  Today, she states that she is doing well overall. Her appetite level is at 100%. Her energy level is at 100%. She denies any diarrhea or worsening of baseline neuropathy.  She has some reflux symptoms.   PAST MEDICAL HISTORY:   Past Medical History: Past Medical History:  Diagnosis Date   Arthritis    Bone spur    left heel   Breast cancer ([place])    bilateral   Cancer ([place])    Bilateral Breast cancer   Cataract    right eye    CKD (chronic kidney disease), stage III ([place])    mgd by PCP    History of breast cancer    left, right   Hypertension    Personal history of radiation therapy    Pneumonia 10/2012   Port-A-Cath in place 12/06/2022     Surgical History: Past Surgical History:  Procedure Laterality Date   ABDOMINAL HYSTERECTOMY     BIOPSY  04/12/2017   Procedure: BIOPSY;  Surgeon: Rogene Houston, MD;  [facility];  Service: Endoscopy;;  ascending colon ulcer biopsies   BREAST BIOPSY Left 09/22/2022   [place] LT BREAST BX W LOC DEV EA ADD LESION IMG BX SPEC US GUIDE 09/22/2022 [facility]   BREAST BIOPSY Left 09/22/2022   [place] LT BREAST BX W LOC DEV 1ST LESION IMG BX SPEC US GUIDE 09/22/2022 [facility]   BREAST LUMPECTOMY  08/28/2001   left   BREAST RECONSTRUCTION Right  implant   CHOLECYSTECTOMY  2002   COLONOSCOPY     COLONOSCOPY N/A 04/12/2017   Procedure: COLONOSCOPY;  Surgeon: Rogene Houston, MD;  [facility];  Service: Endoscopy;  Laterality: N/A;  830   MASTECTOMY  08/15/2006   right   PARTIAL HYSTERECTOMY  1985   PORTACATH PLACEMENT Right 10/31/2022   Procedure: INSERTION PORT-A-CATH;  Surgeon: [name], MD;  Location: [place];  Service: General;  Laterality: Right;   REPLACEMENT TOTAL KNEE  2009, 2010   right-2010, left 2009   SIMPLE MASTECTOMY WITH AXILLARY SENTINEL NODE BIOPSY Left 10/31/2022   Procedure: LEFT MASTECTOMY;  Surgeon: [name], MD;  Location: [place];  Service: General;  Laterality: Left;  90 MIN ROOM 2   TISSUE EXPANDER PLACEMENT     TOTAL HIP ARTHROPLASTY Right 10/09/2017   Procedure: RIGHT TOTAL HIP ARTHROPLASTY ANTERIOR APPROACH;  Surgeon: Frederik Pear, MD;  Location: [place];  Service: Orthopedics;  Laterality: Right;   TOTAL HIP ARTHROPLASTY Left 09/23/2019   Procedure: Left Anterior Hip Arthroplasty;  Surgeon: Frederik Pear, MD;  Location: [facility];  Service: Orthopedics;  Laterality: Left;    Social History: Social History   Socioeconomic History   Marital status: Divorced    Spouse name: Not on file   Number of children: Not on file   Years of education: Not on file   Highest education  level: Not on file  Occupational History   Not on file  Tobacco Use   Smoking status: Former    Packs/day: 0.50    Years: 5.00    Total pack years: 2.50    Types: Cigarettes    Quit date: 09/28/1983    Years since quitting: 39.3   Smokeless tobacco: Never  Vaping Use   Vaping Use: Never used  Substance and Sexual Activity   Alcohol use: No   Drug use: No   Sexual activity: Not on file  Other Topics Concern   Not on file  Social History Narrative   Not on file   Social Determinants of Health   Financial Resource Strain: Not on file  Food Insecurity: Not on file  Transportation Needs: Not on file  Physical Activity: Not on file  Stress: Not on file  Social Connections: Not on file  Intimate Partner Violence: Not on file    Family History: Family History  Problem Relation Age of Onset   Arthritis Mother    Thyroid cancer Brother        2010   Colon cancer Neg Hx    Breast cancer Neg Hx     Current Medications:  Current Outpatient Medications:    Ascorbic Acid (VITAMIN C) 1000 MG tablet, Take 1,000 mg by mouth daily with lunch., Disp: , Rfl:    Calcium-Vitamin D (CALTRATE 600 PLUS-VIT D PO), Take 1 tablet by mouth daily with lunch. , Disp: , Rfl:    Cholecalciferol (D-3-5) 125 MCG (5000 UT) capsule, Take 5,000 Units by mouth daily., Disp: , Rfl:    cholecalciferol (VITAMIN D) 1000 UNITS tablet, Take 1,000 Units by mouth daily with lunch. , Disp: , Rfl:    Garlic (GARLIQUE PO), Take 1 tablet by mouth daily with lunch., Disp: , Rfl:    hydrochlorothiazide (HYDRODIURIL) 12.5 MG tablet, Take 12.5 mg by mouth daily., Disp: , Rfl:    lidocaine-prilocaine (EMLA) cream, Apply a quarter sized amount to port a cath site and cover with plastic wrap 1 hour prior to infusion  appointments, Disp: 30 g, Rfl: 3   losartan (COZAAR) 100 MG tablet, Take 100 mg by mouth daily., Disp: , Rfl:    NON FORMULARY, Balance of Nature - 3 fruits and 3 Vegetables, Disp: , Rfl:    Omega-3 Fatty  Acids (FISH OIL) 1200 MG CPDR, Take by mouth., Disp: , Rfl:    PACLITAXEL IV, Inject into the vein once a week., Disp: , Rfl:    prochlorperazine (COMPAZINE) 10 MG tablet, Take 1 tablet (10 mg total) by mouth every 6 (six) hours as needed for nausea or vomiting., Disp: 60 tablet, Rfl: 3   TRASTUZUMAB IV, Inject into the vein once a week. Weekly x 12 weeks, then every 21 days, Disp: , Rfl:    TURMERIC PO, Take 3 capsules by mouth daily with lunch., Disp: , Rfl:    vitamin B-12 (CYANOCOBALAMIN) 100 MCG tablet, Take 100 mcg by mouth daily., Disp: , Rfl:  No current facility-administered medications for this visit.  Facility-Administered Medications Ordered in Other Visits:    sodium chloride flush (NS) 0.9 % injection 10 mL, 10 mL, Intracatheter, PRN, Derek Jack, MD, 10 mL at 01/17/23 1331   Allergies: Allergies  Allergen Reactions   Penicillins Other (See Comments)    "LOSS OF VISION"  Has patient had a PCN reaction causing immediate rash, facial/tongue/throat swelling, SOB or lightheadedness with hypotension: No Has patient had a PCN reaction causing severe rash involving mucus membranes or skin necrosis: No Has patient had a PCN reaction that required hospitalization: No Has patient had a PCN reaction occurring within the last 10 years: No If all of the above answers are "NO", then may proceed with Cephalosporin use.    Oxytetracycline Other (See Comments)    Fever blisters   Tamoxifen Other (See Comments)    "GENERAL ILLNESS WITH LIGHTHEADNESS"    Aromasin [Exemestane] Other (See Comments)    UNSPECIFIED REACTION  Pt is unsure of what reaction was...   Codeine Other (See Comments)    LIGHTHEADED    REVIEW OF SYSTEMS:   Review of Systems  Constitutional:  Negative for chills, fatigue and fever.  HENT:   Negative for lump/mass, mouth sores, nosebleeds, sore throat and trouble swallowing.   Eyes:  Negative for eye problems.  Respiratory:  Negative for cough and  shortness of breath.   Cardiovascular:  Negative for chest pain, leg swelling and palpitations.  Gastrointestinal:  Negative for abdominal pain, constipation, diarrhea, nausea and vomiting.  Genitourinary:  Negative for bladder incontinence, difficulty urinating, dysuria, frequency, hematuria and nocturia.   Musculoskeletal:  Negative for arthralgias, back pain, flank pain, myalgias and neck pain.  Skin:  Negative for itching and rash.  Neurological:  Negative for dizziness, headaches and numbness.  Hematological:  Does not bruise/bleed easily.  Psychiatric/Behavioral:  Negative for depression, sleep disturbance and suicidal ideas. The patient is not nervous/anxious.   All other systems reviewed and are negative.    VITALS:   There were no vitals taken for this visit.  Wt Readings from Last 3 Encounters:  01/17/23 187 lb 3.2 oz (84.9 kg)  01/10/23 186 lb 6.4 oz (84.6 kg)  12/27/22 183 lb 3.2 oz (83.1 kg)    There is no height or weight on file to calculate BMI.  Performance status (ECOG): 1 - Symptomatic but completely ambulatory  PHYSICAL EXAM:   Physical Exam Vitals and nursing note reviewed. Exam conducted with a chaperone present.  Constitutional:      Appearance: Normal appearance.  Cardiovascular:  Rate and Rhythm: Normal rate and regular rhythm.     Pulses: Normal pulses.     Heart sounds: Normal heart sounds.  Pulmonary:     Effort: Pulmonary effort is normal.     Breath sounds: Normal breath sounds.  Abdominal:     Palpations: Abdomen is soft. There is no hepatomegaly, splenomegaly or mass.     Tenderness: There is no abdominal tenderness.  Musculoskeletal:     Right lower leg: No edema.     Left lower leg: No edema.  Lymphadenopathy:     Cervical: No cervical adenopathy.     Right cervical: No superficial, deep or posterior cervical adenopathy.    Left cervical: No superficial, deep or posterior cervical adenopathy.     Upper Body:     Right upper body: No  supraclavicular or axillary adenopathy.     Left upper body: No supraclavicular or axillary adenopathy.  Neurological:     General: No focal deficit present.     Mental Status: She is alert and oriented to person, place, and time.  Psychiatric:        Mood and Affect: Mood normal.        Behavior: Behavior normal.     LABS:      Latest Ref Rng & Units 01/17/2023    8:55 AM 01/10/2023    8:53 AM 01/03/2023   10:21 AM  CBC  WBC 4.0 - 10.5 K/uL 7.4  7.4  3.9   Hemoglobin 12.0 - 15.0 g/dL 10.8  11.1  11.5   Hematocrit 36.0 - 46.0 % 33.5  34.3  35.8   Platelets 150 - 400 K/uL 383  410  295       Latest Ref Rng & Units 01/17/2023    8:55 AM 01/10/2023    8:53 AM 01/03/2023   10:21 AM  CMP  Glucose 70 - 99 mg/dL 96  101  113   BUN 8 - 23 mg/dL '18  16  14   '$ Creatinine 0.44 - 1.00 mg/dL 1.12  1.17  1.14   Sodium 135 - 145 mmol/L 137  138  136   Potassium 3.5 - 5.1 mmol/L 3.9  3.9  3.7   Chloride 98 - 111 mmol/L 105  104  104   CO2 22 - 32 mmol/L '26  26  27   '$ Calcium 8.9 - 10.3 mg/dL 8.9  9.0  8.5   Total Protein 6.5 - 8.1 g/dL 6.0  6.0  6.3   Total Bilirubin 0.3 - 1.2 mg/dL 0.5  0.4  0.4   Alkaline Phos 38 - 126 U/L 57  66  65   AST 15 - 41 U/L '16  17  19   '$ ALT 0 - 44 U/L '20  23  24      '$ No results found for: "CEA1", "CEA" / No results found for: "CEA1", "CEA" No results found for: "PSA1" No results found for: "CAN199" No results found for: "CAN125"  Lab Results  Component Value Date   TOTALPROTELP 6.4 10/26/2022   ALBUMINELP 3.6 10/26/2022   A1GS 0.2 10/26/2022   A2GS 0.6 10/26/2022   BETS 1.1 10/26/2022   GAMS 0.9 10/26/2022   MSPIKE Not Observed 10/26/2022   SPEI Comment 10/26/2022   No results found for: "TIBC", "FERRITIN", "IRONPCTSAT" Lab Results  Component Value Date   LDH 140 06/27/2011     STUDIES:   No results found.

## 2023-01-17 ENCOUNTER — Encounter: Payer: Self-pay | Admitting: Hematology

## 2023-01-17 ENCOUNTER — Inpatient Hospital Stay: Payer: PPO | Attending: Hematology

## 2023-01-17 ENCOUNTER — Inpatient Hospital Stay (HOSPITAL_BASED_OUTPATIENT_CLINIC_OR_DEPARTMENT_OTHER): Payer: PPO | Admitting: Hematology

## 2023-01-17 ENCOUNTER — Inpatient Hospital Stay: Payer: PPO

## 2023-01-17 VITALS — BP 143/63 | HR 84 | Temp 98.1°F | Resp 18

## 2023-01-17 DIAGNOSIS — C50412 Malignant neoplasm of upper-outer quadrant of left female breast: Secondary | ICD-10-CM | POA: Insufficient documentation

## 2023-01-17 DIAGNOSIS — Z79899 Other long term (current) drug therapy: Secondary | ICD-10-CM | POA: Insufficient documentation

## 2023-01-17 DIAGNOSIS — Z5112 Encounter for antineoplastic immunotherapy: Secondary | ICD-10-CM | POA: Diagnosis not present

## 2023-01-17 DIAGNOSIS — Z170 Estrogen receptor positive status [ER+]: Secondary | ICD-10-CM | POA: Insufficient documentation

## 2023-01-17 DIAGNOSIS — Z95828 Presence of other vascular implants and grafts: Secondary | ICD-10-CM

## 2023-01-17 DIAGNOSIS — Z5111 Encounter for antineoplastic chemotherapy: Secondary | ICD-10-CM | POA: Insufficient documentation

## 2023-01-17 LAB — COMPREHENSIVE METABOLIC PANEL
ALT: 20 U/L (ref 0–44)
AST: 16 U/L (ref 15–41)
Albumin: 3.2 g/dL — ABNORMAL LOW (ref 3.5–5.0)
Alkaline Phosphatase: 57 U/L (ref 38–126)
Anion gap: 6 (ref 5–15)
BUN: 18 mg/dL (ref 8–23)
CO2: 26 mmol/L (ref 22–32)
Calcium: 8.9 mg/dL (ref 8.9–10.3)
Chloride: 105 mmol/L (ref 98–111)
Creatinine, Ser: 1.12 mg/dL — ABNORMAL HIGH (ref 0.44–1.00)
GFR, Estimated: 51 mL/min — ABNORMAL LOW (ref >60)
Glucose, Bld: 96 mg/dL (ref 70–99)
Potassium: 3.9 mmol/L (ref 3.5–5.1)
Sodium: 137 mmol/L (ref 135–145)
Total Bilirubin: 0.5 mg/dL (ref 0.3–1.2)
Total Protein: 6 g/dL — ABNORMAL LOW (ref 6.5–8.1)

## 2023-01-17 LAB — CBC WITH DIFFERENTIAL/PLATELET
Abs Immature Granulocytes: 0.04 10*3/uL (ref 0.00–0.07)
Basophils Absolute: 0.1 10*3/uL (ref 0.0–0.1)
Basophils Relative: 1 %
Eosinophils Absolute: 0.4 10*3/uL (ref 0.0–0.5)
Eosinophils Relative: 5 %
HCT: 33.5 % — ABNORMAL LOW (ref 36.0–46.0)
Hemoglobin: 10.8 g/dL — ABNORMAL LOW (ref 12.0–15.0)
Immature Granulocytes: 1 %
Lymphocytes Relative: 19 %
Lymphs Abs: 1.4 10*3/uL (ref 0.7–4.0)
MCH: 29.8 pg (ref 26.0–34.0)
MCHC: 32.2 g/dL (ref 30.0–36.0)
MCV: 92.3 fL (ref 80.0–100.0)
Monocytes Absolute: 0.6 10*3/uL (ref 0.1–1.0)
Monocytes Relative: 8 %
Neutro Abs: 4.9 10*3/uL (ref 1.7–7.7)
Neutrophils Relative %: 66 %
Platelets: 383 10*3/uL (ref 150–400)
RBC: 3.63 MIL/uL — ABNORMAL LOW (ref 3.87–5.11)
RDW: 14 % (ref 11.5–15.5)
WBC: 7.4 10*3/uL (ref 4.0–10.5)
nRBC: 0 % (ref 0.0–0.2)

## 2023-01-17 LAB — MAGNESIUM: Magnesium: 1.9 mg/dL (ref 1.7–2.4)

## 2023-01-17 MED ORDER — CETIRIZINE HCL 10 MG/ML IV SOLN
10.0000 mg | Freq: Once | INTRAVENOUS | Status: AC
  Administered 2023-01-17: 10 mg via INTRAVENOUS
  Filled 2023-01-17: qty 1

## 2023-01-17 MED ORDER — SODIUM CHLORIDE 0.9 % IV SOLN: Freq: Once | INTRAVENOUS | Status: AC

## 2023-01-17 MED ORDER — HEPARIN SOD (PORK) LOCK FLUSH 100 UNIT/ML IV SOLN
500.0000 [IU] | Freq: Once | INTRAVENOUS | Status: AC | PRN
  Administered 2023-01-17: 500 [IU]

## 2023-01-17 MED ORDER — FAMOTIDINE IN NACL 20-0.9 MG/50ML-% IV SOLN
20.0000 mg | Freq: Once | INTRAVENOUS | Status: AC
  Administered 2023-01-17: 20 mg via INTRAVENOUS
  Filled 2023-01-17: qty 50

## 2023-01-17 MED ORDER — SODIUM CHLORIDE 0.9% FLUSH
10.0000 mL | INTRAVENOUS | Status: DC | PRN
  Administered 2023-01-17: 10 mL

## 2023-01-17 MED ORDER — SODIUM CHLORIDE 0.9 % IV SOLN
64.0000 mg/m2 | Freq: Once | INTRAVENOUS | Status: AC
  Administered 2023-01-17: 126 mg via INTRAVENOUS
  Filled 2023-01-17: qty 21

## 2023-01-17 MED ORDER — ACETAMINOPHEN 325 MG PO TABS
650.0000 mg | ORAL_TABLET | Freq: Once | ORAL | Status: AC
  Administered 2023-01-17: 650 mg via ORAL
  Filled 2023-01-17: qty 2

## 2023-01-17 MED ORDER — TRASTUZUMAB-ANNS CHEMO 150 MG IV SOLR
2.0000 mg/kg | Freq: Once | INTRAVENOUS | Status: AC
  Administered 2023-01-17: 168 mg via INTRAVENOUS
  Filled 2023-01-17: qty 8

## 2023-01-17 MED ORDER — SODIUM CHLORIDE 0.9 % IV SOLN
10.0000 mg | Freq: Once | INTRAVENOUS | Status: AC
  Administered 2023-01-17: 10 mg via INTRAVENOUS
  Filled 2023-01-17: qty 10

## 2023-01-17 NOTE — Patient Instructions (Addendum)
[place] at [facility] Discharge Instructions   You were seen and examined today by Dr. Delton Coombes.  He reviewed the results of your lab work which are normal/stable.   We will proceed with your treatment today.   You may try Maalox over the counter to help with your indigestion/heartburn. If this does not help, we will put you on a pill to help with this.   Return as scheduled.    Thank you for choosing [place] at [facility] to provide your oncology and hematology care.  To afford each patient quality time with our provider, please arrive at least 15 minutes before your scheduled appointment time.   If you have a lab appointment with the [facility] please come in thru the Main Entrance and check in at the main information desk.  You need to re-schedule your appointment should you arrive 10 or more minutes late.  We strive to give you quality time with our providers, and arriving late affects you and other patients whose appointments are after yours.  Also, if you no show three or more times for appointments you may be dismissed from the clinic at the providers discretion.     Again, thank you for choosing [facility].  Our hope is that these requests will decrease the amount of time that you wait before being seen by our physicians.       _____________________________________________________________  Should you have questions after your visit to [facility], please contact our office at [phone] and follow the prompts.  Our office hours are 8:00 a.m. and 4:30 p.m. Monday - Friday.  Please note that voicemails left after 4:00 p.m. may not be returned until the following business day.  We are closed weekends and major holidays.  You do have access to a nurse 24-7, just call the main number to the clinic [phone] and do not press any options, hold on the line and a nurse will answer the phone.    For  prescription refill requests, have your pharmacy contact our office and allow 72 hours.    Due to Covid, you will need to wear a mask upon entering the hospital. If you do not have a mask, a mask will be given to you at the Main Entrance upon arrival. For doctor visits, patients may have 1 support person age 58 or older with them. For treatment visits, patients can not have anyone with them due to social distancing guidelines and our immunocompromised population.

## 2023-01-17 NOTE — Patient Instructions (Signed)
[facility]  Discharge Instructions: Thank you for choosing [facility] to provide your oncology and hematology care.  If you have a lab appointment with the [facility], please come in thru the Main Entrance and check in at the main information desk.  Wear comfortable clothing and clothing appropriate for easy access to any Portacath or PICC line.   We strive to give you quality time with your provider. You may need to reschedule your appointment if you arrive late (15 or more minutes).  Arriving late affects you and other patients whose appointments are after yours.  Also, if you miss three or more appointments without notifying the office, you may be dismissed from the clinic at the provider's discretion.      For prescription refill requests, have your pharmacy contact our office and allow 72 hours for refills to be completed.    Today you received the following chemotherapy and/or immunotherapy agents Taxol and Kanjinti. Trastuzumab Injection What is this medication? TRASTUZUMAB (tras TOO zoo mab) treats breast cancer and stomach cancer. It works by blocking a protein that causes cancer cells to grow and multiply. This helps to slow or stop the spread of cancer cells. This medicine may be used for other purposes; ask your health care provider or pharmacist if you have questions. COMMON BRAND NAME(S): Herceptin, Janae Bridgeman, Ontruzant, Trazimera What should I tell my care team before I take this medication? They need to know if you have any of these conditions: Heart failure Lung disease An unusual or allergic reaction to trastuzumab, other medications, foods, dyes, or preservatives Pregnant or trying to get pregnant Breast-feeding How should I use this medication? This medication is injected into a vein. It is given by your care team in a hospital or clinic setting. Talk to your care team about the use of this medication in children.  It is not approved for use in children. Overdosage: If you think you have taken too much of this medicine contact a poison control center or emergency room at once. NOTE: This medicine is only for you. Do not share this medicine with others. What if I miss a dose? Keep appointments for follow-up doses. It is important not to miss your dose. Call your care team if you are unable to keep an appointment. What may interact with this medication? Certain types of chemotherapy, such as daunorubicin, doxorubicin, epirubicin, idarubicin This list may not describe all possible interactions. Give your health care provider a list of all the medicines, herbs, non-prescription drugs, or dietary supplements you use. Also tell them if you smoke, drink alcohol, or use illegal drugs. Some items may interact with your medicine. What should I watch for while using this medication? Your condition will be monitored carefully while you are receiving this medication. This medication may make you feel generally unwell. This is not uncommon, as chemotherapy affects healthy cells as well as cancer cells. Report any side effects. Continue your course of treatment even though you feel ill unless your care team tells you to stop. This medication may increase your risk of getting an infection. Call your care team for advice if you get a fever, chills, sore throat, or other symptoms of a cold or flu. Do not treat yourself. Try to avoid being around people who are sick. Avoid taking medications that contain aspirin, acetaminophen, ibuprofen, naproxen, or ketoprofen unless instructed by your care team. These medications can hide a fever. Talk to your care team  if you may be pregnant. Serious birth defects can occur if you take this medication during pregnancy and for 7 months after the last dose. You will need a negative pregnancy test before starting this medication. Contraception is recommended while taking this medication and for 7  months after the last dose. Your care team can help you find the option that works for you. Do not breastfeed while taking this medication and for 7 months after stopping treatment. What side effects may I notice from receiving this medication? Side effects that you should report to your care team as soon as possible: Allergic reactions or angioedema--skin rash, itching or hives, swelling of the face, eyes, lips, tongue, arms, or legs, trouble swallowing or breathing Dry cough, shortness of breath or trouble breathing Heart failure--shortness of breath, swelling of the ankles, feet, or hands, sudden weight gain, unusual weakness or fatigue Infection--fever, chills, cough, or sore throat Infusion reactions--chest pain, shortness of breath or trouble breathing, feeling faint or lightheaded Side effects that usually do not require medical attention (report to your care team if they continue or are bothersome): Diarrhea Dizziness Headache Nausea Trouble sleeping Vomiting This list may not describe all possible side effects. Call your doctor for medical advice about side effects. You may report side effects to FDA at 1-800-FDA-1088. Where should I keep my medication? This medication is given in a hospital or clinic. It will not be stored at home. NOTE: This sheet is a summary. It may not cover all possible information. If you have questions about this medicine, talk to your doctor, pharmacist, or health care provider.  2023 Elsevier/Gold Standard (2022-03-03 00:00:00) Paclitaxel Injection What is this medication? PACLITAXEL (PAK li TAX el) treats some types of cancer. It works by slowing down the growth of cancer cells. This medicine may be used for other purposes; ask your health care provider or pharmacist if you have questions. COMMON BRAND NAME(S): Onxol, Taxol What should I tell my care team before I take this medication? They need to know if you have any of these conditions: Heart  disease Liver disease Low white blood cell levels An unusual or allergic reaction to paclitaxel, other medications, foods, dyes, or preservatives If you or your partner are pregnant or trying to get pregnant Breast-feeding How should I use this medication? This medication is injected into a vein. It is given by your care team in a hospital or clinic setting. Talk to your care team about the use of this medication in children. While it may be given to children for selected conditions, precautions do apply. Overdosage: If you think you have taken too much of this medicine contact a poison control center or emergency room at once. NOTE: This medicine is only for you. Do not share this medicine with others. What if I miss a dose? Keep appointments for follow-up doses. It is important not to miss your dose. Call your care team if you are unable to keep an appointment. What may interact with this medication? Do not take this medication with any of the following: Live virus vaccines Other medications may affect the way this medication works. Talk with your care team about all of the medications you take. They may suggest changes to your treatment plan to lower the risk of side effects and to make sure your medications work as intended. This list may not describe all possible interactions. Give your health care provider a list of all the medicines, herbs, non-prescription drugs, or dietary supplements you use.  Also tell them if you smoke, drink alcohol, or use illegal drugs. Some items may interact with your medicine. What should I watch for while using this medication? Your condition will be monitored carefully while you are receiving this medication. You may need blood work while taking this medication. This medication may make you feel generally unwell. This is not uncommon as chemotherapy can affect healthy cells as well as cancer cells. Report any side effects. Continue your course of treatment even  though you feel ill unless your care team tells you to stop. This medication can cause serious allergic reactions. To reduce the risk, your care team may give you other medications to take before receiving this one. Be sure to follow the directions from your care team. This medication may increase your risk of getting an infection. Call your care team for advice if you get a fever, chills, sore throat, or other symptoms of a cold or flu. Do not treat yourself. Try to avoid being around people who are sick. This medication may increase your risk to bruise or bleed. Call your care team if you notice any unusual bleeding. Be careful brushing or flossing your teeth or using a toothpick because you may get an infection or bleed more easily. If you have any dental work done, tell your dentist you are receiving this medication. Talk to your care team if you may be pregnant. Serious birth defects can occur if you take this medication during pregnancy. Talk to your care team before breastfeeding. Changes to your treatment plan may be needed. What side effects may I notice from receiving this medication? Side effects that you should report to your care team as soon as possible: Allergic reactions--skin rash, itching, hives, swelling of the face, lips, tongue, or throat Heart rhythm changes--fast or irregular heartbeat, dizziness, feeling faint or lightheaded, chest pain, trouble breathing Increase in blood pressure Infection--fever, chills, cough, sore throat, wounds that don't heal, pain or trouble when passing urine, general feeling of discomfort or being unwell Low blood pressure--dizziness, feeling faint or lightheaded, blurry vision Low red blood cell level--unusual weakness or fatigue, dizziness, headache, trouble breathing Painful swelling, warmth, or redness of the skin, blisters or sores at the infusion site Pain, tingling, or numbness in the hands or feet Slow heartbeat--dizziness, feeling faint or  lightheaded, confusion, trouble breathing, unusual weakness or fatigue Unusual bruising or bleeding Side effects that usually do not require medical attention (report to your care team if they continue or are bothersome): Diarrhea Hair loss Joint pain Loss of appetite Muscle pain Nausea Vomiting This list may not describe all possible side effects. Call your doctor for medical advice about side effects. You may report side effects to FDA at 1-800-FDA-1088. Where should I keep my medication? This medication is given in a hospital or clinic. It will not be stored at home. NOTE: This sheet is a summary. It may not cover all possible information. If you have questions about this medicine, talk to your doctor, pharmacist, or health care provider.  2023 Elsevier/Gold Standard (2022-03-17 00:00:00)       To help prevent nausea and vomiting after your treatment, we encourage you to take your nausea medication as directed.  BELOW ARE SYMPTOMS THAT SHOULD BE REPORTED IMMEDIATELY: *FEVER GREATER THAN 100.4 F (38 C) OR HIGHER *CHILLS OR SWEATING *NAUSEA AND VOMITING THAT IS NOT CONTROLLED WITH YOUR NAUSEA MEDICATION *UNUSUAL SHORTNESS OF BREATH *UNUSUAL BRUISING OR BLEEDING *URINARY PROBLEMS (pain or burning when urinating, or frequent urination) *  BOWEL PROBLEMS (unusual diarrhea, constipation, pain near the anus) TENDERNESS IN MOUTH AND THROAT WITH OR WITHOUT PRESENCE OF ULCERS (sore throat, sores in mouth, or a toothache) UNUSUAL RASH, SWELLING OR PAIN  UNUSUAL VAGINAL DISCHARGE OR ITCHING   Items with * indicate a potential emergency and should be followed up as soon as possible or go to the Emergency Department if any problems should occur.  Please show the CHEMOTHERAPY ALERT CARD or IMMUNOTHERAPY ALERT CARD at check-in to the Emergency Department and triage nurse.  Should you have questions after your visit or need to cancel or reschedule your appointment, please contact [facility] [phone]  and follow the prompts.  Office hours are 8:00 a.m. to 4:30 p.m. Monday - Friday. Please note that voicemails left after 4:00 p.m. may not be returned until the following business day.  We are closed weekends and major holidays. You have access to a nurse at all times for urgent questions. Please call the main number to the clinic [phone] and follow the prompts.  For any non-urgent questions, you may also contact your provider using MyChart. We now offer e-Visits for anyone 97 and older to request care online for non-urgent symptoms. For details visit [email or web address].   Also download the MyChart app! Go to the app store, search "MyChart", open the app, select [facility], and log in with your MyChart username and password.

## 2023-01-17 NOTE — Progress Notes (Signed)
Message received from A. Ouida Sills RN / Dr. Delton Coombes to proceed with treatment pending CMP results. Labs within parameters for treatment. Blood pressure elevated on arrival and MD aware and labs reviewed. Patient took blood pressure medication prior to arrival.   Treatment given today per MD orders. Tolerated infusion without adverse affects. Vital signs stable. No complaints at this time. Discharged from clinic ambulatory in stable condition. Alert and oriented x 3. F/U with [facility] as scheduled.

## 2023-01-18 DIAGNOSIS — N1831 Chronic kidney disease, stage 3a: Secondary | ICD-10-CM | POA: Diagnosis not present

## 2023-01-18 DIAGNOSIS — G609 Hereditary and idiopathic neuropathy, unspecified: Secondary | ICD-10-CM | POA: Diagnosis not present

## 2023-01-18 DIAGNOSIS — I129 Hypertensive chronic kidney disease with stage 1 through stage 4 chronic kidney disease, or unspecified chronic kidney disease: Secondary | ICD-10-CM | POA: Diagnosis not present

## 2023-01-18 DIAGNOSIS — R7301 Impaired fasting glucose: Secondary | ICD-10-CM | POA: Diagnosis not present

## 2023-01-18 DIAGNOSIS — D75839 Thrombocytosis, unspecified: Secondary | ICD-10-CM | POA: Diagnosis not present

## 2023-01-18 DIAGNOSIS — C50412 Malignant neoplasm of upper-outer quadrant of left female breast: Secondary | ICD-10-CM | POA: Diagnosis not present

## 2023-01-18 DIAGNOSIS — M1990 Unspecified osteoarthritis, unspecified site: Secondary | ICD-10-CM | POA: Diagnosis not present

## 2023-01-18 DIAGNOSIS — E7800 Pure hypercholesterolemia, unspecified: Secondary | ICD-10-CM | POA: Diagnosis not present

## 2023-01-18 DIAGNOSIS — M8580 Other specified disorders of bone density and structure, unspecified site: Secondary | ICD-10-CM | POA: Diagnosis not present

## 2023-01-18 DIAGNOSIS — I10 Essential (primary) hypertension: Secondary | ICD-10-CM | POA: Diagnosis not present

## 2023-01-18 DIAGNOSIS — E870 Hyperosmolality and hypernatremia: Secondary | ICD-10-CM | POA: Diagnosis not present

## 2023-01-18 DIAGNOSIS — D631 Anemia in chronic kidney disease: Secondary | ICD-10-CM | POA: Diagnosis not present

## 2023-01-24 ENCOUNTER — Inpatient Hospital Stay: Payer: PPO

## 2023-01-24 ENCOUNTER — Inpatient Hospital Stay: Payer: PPO | Admitting: Hematology

## 2023-01-24 VITALS — BP 138/69 | HR 89 | Temp 97.9°F | Resp 18

## 2023-01-24 DIAGNOSIS — Z95828 Presence of other vascular implants and grafts: Secondary | ICD-10-CM

## 2023-01-24 DIAGNOSIS — Z170 Estrogen receptor positive status [ER+]: Secondary | ICD-10-CM

## 2023-01-24 DIAGNOSIS — Z5112 Encounter for antineoplastic immunotherapy: Secondary | ICD-10-CM | POA: Diagnosis not present

## 2023-01-24 LAB — CBC WITH DIFFERENTIAL/PLATELET
Abs Immature Granulocytes: 0.03 10*3/uL (ref 0.00–0.07)
Basophils Absolute: 0 10*3/uL (ref 0.0–0.1)
Basophils Relative: 1 %
Eosinophils Absolute: 0.5 10*3/uL (ref 0.0–0.5)
Eosinophils Relative: 7 %
HCT: 32.1 % — ABNORMAL LOW (ref 36.0–46.0)
Hemoglobin: 10.5 g/dL — ABNORMAL LOW (ref 12.0–15.0)
Immature Granulocytes: 1 %
Lymphocytes Relative: 20 %
Lymphs Abs: 1.3 10*3/uL (ref 0.7–4.0)
MCH: 29.9 pg (ref 26.0–34.0)
MCHC: 32.7 g/dL (ref 30.0–36.0)
MCV: 91.5 fL (ref 80.0–100.0)
Monocytes Absolute: 0.8 10*3/uL (ref 0.1–1.0)
Monocytes Relative: 13 %
Neutro Abs: 3.8 10*3/uL (ref 1.7–7.7)
Neutrophils Relative %: 58 %
Platelets: 304 10*3/uL (ref 150–400)
RBC: 3.51 MIL/uL — ABNORMAL LOW (ref 3.87–5.11)
RDW: 14.4 % (ref 11.5–15.5)
WBC: 6.5 10*3/uL (ref 4.0–10.5)
nRBC: 0 % (ref 0.0–0.2)

## 2023-01-24 LAB — COMPREHENSIVE METABOLIC PANEL
ALT: 19 U/L (ref 0–44)
AST: 14 U/L — ABNORMAL LOW (ref 15–41)
Albumin: 3.1 g/dL — ABNORMAL LOW (ref 3.5–5.0)
Alkaline Phosphatase: 56 U/L (ref 38–126)
Anion gap: 6 (ref 5–15)
BUN: 21 mg/dL (ref 8–23)
CO2: 27 mmol/L (ref 22–32)
Calcium: 8.8 mg/dL — ABNORMAL LOW (ref 8.9–10.3)
Chloride: 103 mmol/L (ref 98–111)
Creatinine, Ser: 1.26 mg/dL — ABNORMAL HIGH (ref 0.44–1.00)
GFR, Estimated: 44 mL/min — ABNORMAL LOW (ref >60)
Glucose, Bld: 99 mg/dL (ref 70–99)
Potassium: 4 mmol/L (ref 3.5–5.1)
Sodium: 136 mmol/L (ref 135–145)
Total Bilirubin: 0.5 mg/dL (ref 0.3–1.2)
Total Protein: 6 g/dL — ABNORMAL LOW (ref 6.5–8.1)

## 2023-01-24 LAB — MAGNESIUM: Magnesium: 2.1 mg/dL (ref 1.7–2.4)

## 2023-01-24 MED ORDER — SODIUM CHLORIDE 0.9% FLUSH
10.0000 mL | INTRAVENOUS | Status: DC | PRN
  Administered 2023-01-24: 10 mL

## 2023-01-24 MED ORDER — SODIUM CHLORIDE 0.9 % IV SOLN
64.0000 mg/m2 | Freq: Once | INTRAVENOUS | Status: AC
  Administered 2023-01-24: 126 mg via INTRAVENOUS
  Filled 2023-01-24: qty 21

## 2023-01-24 MED ORDER — ACETAMINOPHEN 325 MG PO TABS
650.0000 mg | ORAL_TABLET | Freq: Once | ORAL | Status: AC
  Administered 2023-01-24: 650 mg via ORAL
  Filled 2023-01-24: qty 2

## 2023-01-24 MED ORDER — CETIRIZINE HCL 10 MG/ML IV SOLN
10.0000 mg | Freq: Once | INTRAVENOUS | Status: AC
  Administered 2023-01-24: 10 mg via INTRAVENOUS
  Filled 2023-01-24: qty 1

## 2023-01-24 MED ORDER — HEPARIN SOD (PORK) LOCK FLUSH 100 UNIT/ML IV SOLN
500.0000 [IU] | Freq: Once | INTRAVENOUS | Status: AC | PRN
  Administered 2023-01-24: 500 [IU]

## 2023-01-24 MED ORDER — TRASTUZUMAB-ANNS CHEMO 420 MG IV SOLR
2.0000 mg/kg | Freq: Once | INTRAVENOUS | Status: AC
  Administered 2023-01-24: 168 mg via INTRAVENOUS
  Filled 2023-01-24: qty 8

## 2023-01-24 MED ORDER — SODIUM CHLORIDE 0.9 % IV SOLN
10.0000 mg | Freq: Once | INTRAVENOUS | Status: AC
  Administered 2023-01-24: 10 mg via INTRAVENOUS
  Filled 2023-01-24: qty 10

## 2023-01-24 MED ORDER — FAMOTIDINE IN NACL 20-0.9 MG/50ML-% IV SOLN
20.0000 mg | Freq: Once | INTRAVENOUS | Status: AC
  Administered 2023-01-24: 20 mg via INTRAVENOUS
  Filled 2023-01-24: qty 50

## 2023-01-24 MED ORDER — SODIUM CHLORIDE 0.9% FLUSH
10.0000 mL | Freq: Once | INTRAVENOUS | Status: AC
  Administered 2023-01-24: 10 mL

## 2023-01-24 MED ORDER — SODIUM CHLORIDE 0.9 % IV SOLN: Freq: Once | INTRAVENOUS | Status: AC

## 2023-01-24 NOTE — Patient Instructions (Signed)
[facility]  Discharge Instructions: Thank you for choosing [facility] to provide your oncology and hematology care.  If you have a lab appointment with the [facility], please come in thru the Main Entrance and check in at the main information desk.  Wear comfortable clothing and clothing appropriate for easy access to any Portacath or PICC line.   We strive to give you quality time with your provider. You may need to reschedule your appointment if you arrive late (15 or more minutes).  Arriving late affects you and other patients whose appointments are after yours.  Also, if you miss three or more appointments without notifying the office, you may be dismissed from the clinic at the provider's discretion.      For prescription refill requests, have your pharmacy contact our office and allow 72 hours for refills to be completed.    Today you received the following chemotherapy and/or immunotherapy agents Kanjinit and Taxol     To help prevent nausea and vomiting after your treatment, we encourage you to take your nausea medication as directed.  Trastuzumab Injection What is this medication? TRASTUZUMAB (tras TOO zoo mab) treats breast cancer and stomach cancer. It works by blocking a protein that causes cancer cells to grow and multiply. This helps to slow or stop the spread of cancer cells. This medicine may be used for other purposes; ask your health care provider or pharmacist if you have questions. COMMON BRAND NAME(S): Herceptin, Janae Bridgeman, Ontruzant, Trazimera What should I tell my care team before I take this medication? They need to know if you have any of these conditions: Heart failure Lung disease An unusual or allergic reaction to trastuzumab, other medications, foods, dyes, or preservatives Pregnant or trying to get pregnant Breast-feeding How should I use this medication? This medication is injected into a vein. It is  given by your care team in a hospital or clinic setting. Talk to your care team about the use of this medication in children. It is not approved for use in children. Overdosage: If you think you have taken too much of this medicine contact a poison control center or emergency room at once. NOTE: This medicine is only for you. Do not share this medicine with others. What if I miss a dose? Keep appointments for follow-up doses. It is important not to miss your dose. Call your care team if you are unable to keep an appointment. What may interact with this medication? Certain types of chemotherapy, such as daunorubicin, doxorubicin, epirubicin, idarubicin This list may not describe all possible interactions. Give your health care provider a list of all the medicines, herbs, non-prescription drugs, or dietary supplements you use. Also tell them if you smoke, drink alcohol, or use illegal drugs. Some items may interact with your medicine. What should I watch for while using this medication? Your condition will be monitored carefully while you are receiving this medication. This medication may make you feel generally unwell. This is not uncommon, as chemotherapy affects healthy cells as well as cancer cells. Report any side effects. Continue your course of treatment even though you feel ill unless your care team tells you to stop. This medication may increase your risk of getting an infection. Call your care team for advice if you get a fever, chills, sore throat, or other symptoms of a cold or flu. Do not treat yourself. Try to avoid being around people who are sick. Avoid taking medications that  contain aspirin, acetaminophen, ibuprofen, naproxen, or ketoprofen unless instructed by your care team. These medications can hide a fever. Talk to your care team if you may be pregnant. Serious birth defects can occur if you take this medication during pregnancy and for 7 months after the last dose. You will need a  negative pregnancy test before starting this medication. Contraception is recommended while taking this medication and for 7 months after the last dose. Your care team can help you find the option that works for you. Do not breastfeed while taking this medication and for 7 months after stopping treatment. What side effects may I notice from receiving this medication? Side effects that you should report to your care team as soon as possible: Allergic reactions or angioedema--skin rash, itching or hives, swelling of the face, eyes, lips, tongue, arms, or legs, trouble swallowing or breathing Dry cough, shortness of breath or trouble breathing Heart failure--shortness of breath, swelling of the ankles, feet, or hands, sudden weight gain, unusual weakness or fatigue Infection--fever, chills, cough, or sore throat Infusion reactions--chest pain, shortness of breath or trouble breathing, feeling faint or lightheaded Side effects that usually do not require medical attention (report to your care team if they continue or are bothersome): Diarrhea Dizziness Headache Nausea Trouble sleeping Vomiting This list may not describe all possible side effects. Call your doctor for medical advice about side effects. You may report side effects to FDA at 1-800-FDA-1088. Where should I keep my medication? This medication is given in a hospital or clinic. It will not be stored at home. NOTE: This sheet is a summary. It may not cover all possible information. If you have questions about this medicine, talk to your doctor, pharmacist, or health care provider.  2023 Elsevier/Gold Standard (2022-03-03 00:00:00)  Paclitaxel Injection What is this medication? PACLITAXEL (PAK li TAX el) treats some types of cancer. It works by slowing down the growth of cancer cells. This medicine may be used for other purposes; ask your health care provider or pharmacist if you have questions. COMMON BRAND NAME(S): Onxol, Taxol What  should I tell my care team before I take this medication? They need to know if you have any of these conditions: Heart disease Liver disease Low white blood cell levels An unusual or allergic reaction to paclitaxel, other medications, foods, dyes, or preservatives If you or your partner are pregnant or trying to get pregnant Breast-feeding How should I use this medication? This medication is injected into a vein. It is given by your care team in a hospital or clinic setting. Talk to your care team about the use of this medication in children. While it may be given to children for selected conditions, precautions do apply. Overdosage: If you think you have taken too much of this medicine contact a poison control center or emergency room at once. NOTE: This medicine is only for you. Do not share this medicine with others. What if I miss a dose? Keep appointments for follow-up doses. It is important not to miss your dose. Call your care team if you are unable to keep an appointment. What may interact with this medication? Do not take this medication with any of the following: Live virus vaccines Other medications may affect the way this medication works. Talk with your care team about all of the medications you take. They may suggest changes to your treatment plan to lower the risk of side effects and to make sure your medications work as intended. This list  may not describe all possible interactions. Give your health care provider a list of all the medicines, herbs, non-prescription drugs, or dietary supplements you use. Also tell them if you smoke, drink alcohol, or use illegal drugs. Some items may interact with your medicine. What should I watch for while using this medication? Your condition will be monitored carefully while you are receiving this medication. You may need blood work while taking this medication. This medication may make you feel generally unwell. This is not uncommon as  chemotherapy can affect healthy cells as well as cancer cells. Report any side effects. Continue your course of treatment even though you feel ill unless your care team tells you to stop. This medication can cause serious allergic reactions. To reduce the risk, your care team may give you other medications to take before receiving this one. Be sure to follow the directions from your care team. This medication may increase your risk of getting an infection. Call your care team for advice if you get a fever, chills, sore throat, or other symptoms of a cold or flu. Do not treat yourself. Try to avoid being around people who are sick. This medication may increase your risk to bruise or bleed. Call your care team if you notice any unusual bleeding. Be careful brushing or flossing your teeth or using a toothpick because you may get an infection or bleed more easily. If you have any dental work done, tell your dentist you are receiving this medication. Talk to your care team if you may be pregnant. Serious birth defects can occur if you take this medication during pregnancy. Talk to your care team before breastfeeding. Changes to your treatment plan may be needed. What side effects may I notice from receiving this medication? Side effects that you should report to your care team as soon as possible: Allergic reactions--skin rash, itching, hives, swelling of the face, lips, tongue, or throat Heart rhythm changes--fast or irregular heartbeat, dizziness, feeling faint or lightheaded, chest pain, trouble breathing Increase in blood pressure Infection--fever, chills, cough, sore throat, wounds that don't heal, pain or trouble when passing urine, general feeling of discomfort or being unwell Low blood pressure--dizziness, feeling faint or lightheaded, blurry vision Low red blood cell level--unusual weakness or fatigue, dizziness, headache, trouble breathing Painful swelling, warmth, or redness of the skin,  blisters or sores at the infusion site Pain, tingling, or numbness in the hands or feet Slow heartbeat--dizziness, feeling faint or lightheaded, confusion, trouble breathing, unusual weakness or fatigue Unusual bruising or bleeding Side effects that usually do not require medical attention (report to your care team if they continue or are bothersome): Diarrhea Hair loss Joint pain Loss of appetite Muscle pain Nausea Vomiting This list may not describe all possible side effects. Call your doctor for medical advice about side effects. You may report side effects to FDA at 1-800-FDA-1088. Where should I keep my medication? This medication is given in a hospital or clinic. It will not be stored at home. NOTE: This sheet is a summary. It may not cover all possible information. If you have questions about this medicine, talk to your doctor, pharmacist, or health care provider.  2023 Elsevier/Gold Standard (2022-03-17 00:00:00)    BELOW ARE SYMPTOMS THAT SHOULD BE REPORTED IMMEDIATELY: *FEVER GREATER THAN 100.4 F (38 C) OR HIGHER *CHILLS OR SWEATING *NAUSEA AND VOMITING THAT IS NOT CONTROLLED WITH YOUR NAUSEA MEDICATION *UNUSUAL SHORTNESS OF BREATH *UNUSUAL BRUISING OR BLEEDING *URINARY PROBLEMS (pain or burning when urinating, or  frequent urination) *BOWEL PROBLEMS (unusual diarrhea, constipation, pain near the anus) TENDERNESS IN MOUTH AND THROAT WITH OR WITHOUT PRESENCE OF ULCERS (sore throat, sores in mouth, or a toothache) UNUSUAL RASH, SWELLING OR PAIN  UNUSUAL VAGINAL DISCHARGE OR ITCHING   Items with * indicate a potential emergency and should be followed up as soon as possible or go to the Emergency Department if any problems should occur.  Please show the CHEMOTHERAPY ALERT CARD or IMMUNOTHERAPY ALERT CARD at check-in to the Emergency Department and triage nurse.  Should you have questions after your visit or need to cancel or reschedule your appointment, please contact  [facility] [phone]  and follow the prompts.  Office hours are 8:00 a.m. to 4:30 p.m. Monday - Friday. Please note that voicemails left after 4:00 p.m. may not be returned until the following business day.  We are closed weekends and major holidays. You have access to a nurse at all times for urgent questions. Please call the main number to the clinic [phone] and follow the prompts.  For any non-urgent questions, you may also contact your provider using MyChart. We now offer e-Visits for anyone 7 and older to request care online for non-urgent symptoms. For details visit [email or web address].   Also download the MyChart app! Go to the app store, search "MyChart", open the app, select [facility], and log in with your MyChart username and password.

## 2023-01-24 NOTE — Progress Notes (Signed)
Pt presents today for Kanjinti and Taxol per provider's order. Vital signs and labs WNL for treatment.Okay to proceed with treatment today.  Kanjinti and Taxol given today per MD orders. Tolerated infusion without adverse affects. Vital signs stable. No complaints at this time. Discharged from clinic ambulatory in stable condition. Alert and oriented x 3. F/U with [facility] as scheduled.

## 2023-01-26 ENCOUNTER — Inpatient Hospital Stay: Payer: PPO | Admitting: Licensed Clinical Social Worker

## 2023-01-27 DIAGNOSIS — J040 Acute laryngitis: Secondary | ICD-10-CM | POA: Diagnosis not present

## 2023-01-27 DIAGNOSIS — Z87891 Personal history of nicotine dependence: Secondary | ICD-10-CM | POA: Diagnosis not present

## 2023-01-27 DIAGNOSIS — J019 Acute sinusitis, unspecified: Secondary | ICD-10-CM | POA: Diagnosis not present

## 2023-01-30 ENCOUNTER — Ambulatory Visit: Payer: PPO | Attending: General Surgery | Admitting: Rehabilitation

## 2023-01-30 ENCOUNTER — Encounter: Payer: Self-pay | Admitting: Rehabilitation

## 2023-01-30 DIAGNOSIS — Z483 Aftercare following surgery for neoplasm: Secondary | ICD-10-CM | POA: Insufficient documentation

## 2023-01-30 DIAGNOSIS — C50919 Malignant neoplasm of unspecified site of unspecified female breast: Secondary | ICD-10-CM | POA: Insufficient documentation

## 2023-01-30 DIAGNOSIS — Z9189 Other specified personal risk factors, not elsewhere classified: Secondary | ICD-10-CM | POA: Insufficient documentation

## 2023-01-30 NOTE — Therapy (Signed)
OUTPATIENT PHYSICAL THERAPY SOZO SCREENING NOTE   Patient Name: Haley Peterson MRN: [id number] DOB:12-Jul-1946, 77 y.o., female Today's Date: 01/30/2023  PCP: Celene Squibb, MD REFERRING PROVIDER: [name], MD   PT End of Session - 01/30/23 1044     Visit Number 2   screen only   PT Start Time 1050    PT Stop Time 1053    PT Time Calculation (min) 3 min    Activity Tolerance Patient tolerated treatment well    Behavior During Therapy WFL for tasks assessed/performed             Past Medical History:  Diagnosis Date   Arthritis    Bone spur    left heel   Breast cancer ([place])    bilateral   Cancer ([place])    Bilateral Breast cancer   Cataract    right eye    CKD (chronic kidney disease), stage III ([place])    mgd by PCP    History of breast cancer    left, right   Hypertension    Personal history of radiation therapy    Pneumonia 10/2012   Port-A-Cath in place 12/06/2022   Past Surgical History:  Procedure Laterality Date   ABDOMINAL HYSTERECTOMY     BIOPSY  04/12/2017   Procedure: BIOPSY;  Surgeon: Rogene Houston, MD;  [facility];  Service: Endoscopy;;  ascending colon ulcer biopsies   BREAST BIOPSY Left 09/22/2022   [place] LT BREAST BX W LOC DEV EA ADD LESION IMG BX SPEC US GUIDE 09/22/2022 [facility]   BREAST BIOPSY Left 09/22/2022   [place] LT BREAST BX W LOC DEV 1ST LESION IMG BX [place] US GUIDE 09/22/2022 [facility]   BREAST LUMPECTOMY  08/28/2001   left   BREAST RECONSTRUCTION Right    implant   CHOLECYSTECTOMY  2002   COLONOSCOPY     COLONOSCOPY N/A 04/12/2017   Procedure: COLONOSCOPY;  Surgeon: Rogene Houston, MD;  [facility];  Service: Endoscopy;  Laterality: N/A;  830   MASTECTOMY  08/15/2006   right   PARTIAL HYSTERECTOMY  1985   PORTACATH PLACEMENT Right 10/31/2022   Procedure: INSERTION PORT-A-CATH;  Surgeon: [name], MD;  Location: [facility];  Service: General;  Laterality: Right;    REPLACEMENT TOTAL KNEE  2009, 2010   right-2010, left 2009   SIMPLE MASTECTOMY WITH AXILLARY SENTINEL NODE BIOPSY Left 10/31/2022   Procedure: LEFT MASTECTOMY;  Surgeon: [name], MD;  Location: [facility];  Service: General;  Laterality: Left;  90 MIN ROOM 2   TISSUE EXPANDER PLACEMENT     TOTAL HIP ARTHROPLASTY Right 10/09/2017   Procedure: RIGHT TOTAL HIP ARTHROPLASTY ANTERIOR APPROACH;  Surgeon: Frederik Pear, MD;  Location: [facility];  Service: Orthopedics;  Laterality: Right;   TOTAL HIP ARTHROPLASTY Left 09/23/2019   Procedure: Left Anterior Hip Arthroplasty;  Surgeon: Frederik Pear, MD;  Location: [facility];  Service: Orthopedics;  Laterality: Left;   Patient Active Problem List   Diagnosis Date Noted   Port-A-Cath in place 12/06/2022   S/P left mastectomy 10/31/2022   Breast cancer of upper-outer quadrant of left female breast ([place]) 10/25/2022   S/P hip replacement, left 09/23/2019   Osteoarthritis of left hip 09/20/2019   Primary osteoarthritis of right hip 10/09/2017   Osteoarthritis of right hip 10/04/2017   Special screening for malignant neoplasms, colon 12/30/2016   Breast cancer ([place]) 06/27/2011   Hx Breast cancer, Right, multifocal  IDC, receptor +, Her2 - 06/07/2006    Class: Stage 1   Hx Breast cancer ([facility]IS), Right, Stage 0,  08/08/2001    REFERRING DIAG: left breast cancer at risk for lymphedema  THERAPY DIAG:  At risk for lymphedema  Malignant neoplasm of female breast, unspecified estrogen receptor status, unspecified laterality, unspecified site of breast [facility])  Aftercare following surgery for neoplasm  PERTINENT HISTORY: Patient was diagnosed on 10/13/22 with left breast cancer in 2 locations measuring around 2.5cm in total. Negative axillary [place]. grade 2 IDC. It is ER/PR/HER2 positive with a Ki67 of 20%.  Prior hx of Lt lumpectomy with radiation and SLNB with 1 negative lymph node removed in 2002 and Rt mastectomy with implant in 2007. Lt  mastectomy 10/31/22 with 1 negative node removed.   PRECAUTIONS: left UE Lymphedema risk  SUBJECTIVE: Doing well   PAIN:  Are you having pain? No  SOZO SCREENING: Patient was assessed today using the SOZO machine to determine the lymphedema index score. This was compared to her baseline score. It was determined that she is within the recommended range when compared to her baseline and no further action is needed at this time. She will continue SOZO screenings. These are done every 3 months for 2 years post operatively followed by every 6 months for 2 years, and then annually.  SOZO Q33months until at least 10/31/24 and Q32months until 10/31/26  Stark Bray, PT 01/30/2023, 10:52 AM

## 2023-01-31 ENCOUNTER — Inpatient Hospital Stay: Payer: PPO

## 2023-01-31 ENCOUNTER — Other Ambulatory Visit: Payer: PPO

## 2023-01-31 ENCOUNTER — Ambulatory Visit: Payer: PPO | Admitting: Hematology

## 2023-01-31 VITALS — BP 135/56 | HR 86 | Temp 98.0°F | Resp 16

## 2023-01-31 DIAGNOSIS — Z170 Estrogen receptor positive status [ER+]: Secondary | ICD-10-CM

## 2023-01-31 DIAGNOSIS — Z95828 Presence of other vascular implants and grafts: Secondary | ICD-10-CM

## 2023-01-31 DIAGNOSIS — Z5112 Encounter for antineoplastic immunotherapy: Secondary | ICD-10-CM | POA: Diagnosis not present

## 2023-01-31 LAB — COMPREHENSIVE METABOLIC PANEL
ALT: 18 U/L (ref 0–44)
AST: 16 U/L (ref 15–41)
Albumin: 3.2 g/dL — ABNORMAL LOW (ref 3.5–5.0)
Alkaline Phosphatase: 57 U/L (ref 38–126)
Anion gap: 7 (ref 5–15)
BUN: 16 mg/dL (ref 8–23)
CO2: 25 mmol/L (ref 22–32)
Calcium: 8.7 mg/dL — ABNORMAL LOW (ref 8.9–10.3)
Chloride: 106 mmol/L (ref 98–111)
Creatinine, Ser: 1.21 mg/dL — ABNORMAL HIGH (ref 0.44–1.00)
GFR, Estimated: 46 mL/min — ABNORMAL LOW (ref >60)
Glucose, Bld: 109 mg/dL — ABNORMAL HIGH (ref 70–99)
Potassium: 3.6 mmol/L (ref 3.5–5.1)
Sodium: 138 mmol/L (ref 135–145)
Total Bilirubin: 0.5 mg/dL (ref 0.3–1.2)
Total Protein: 6.3 g/dL — ABNORMAL LOW (ref 6.5–8.1)

## 2023-01-31 LAB — CBC WITH DIFFERENTIAL/PLATELET
Abs Immature Granulocytes: 0.05 10*3/uL (ref 0.00–0.07)
Basophils Absolute: 0.1 10*3/uL (ref 0.0–0.1)
Basophils Relative: 1 %
Eosinophils Absolute: 0.4 10*3/uL (ref 0.0–0.5)
Eosinophils Relative: 5 %
HCT: 33.9 % — ABNORMAL LOW (ref 36.0–46.0)
Hemoglobin: 10.8 g/dL — ABNORMAL LOW (ref 12.0–15.0)
Immature Granulocytes: 1 %
Lymphocytes Relative: 16 %
Lymphs Abs: 1.2 10*3/uL (ref 0.7–4.0)
MCH: 29.5 pg (ref 26.0–34.0)
MCHC: 31.9 g/dL (ref 30.0–36.0)
MCV: 92.6 fL (ref 80.0–100.0)
Monocytes Absolute: 0.7 10*3/uL (ref 0.1–1.0)
Monocytes Relative: 9 %
Neutro Abs: 5.4 10*3/uL (ref 1.7–7.7)
Neutrophils Relative %: 68 %
Platelets: 355 10*3/uL (ref 150–400)
RBC: 3.66 MIL/uL — ABNORMAL LOW (ref 3.87–5.11)
RDW: 14.6 % (ref 11.5–15.5)
WBC: 7.8 10*3/uL (ref 4.0–10.5)
nRBC: 0 % (ref 0.0–0.2)

## 2023-01-31 LAB — MAGNESIUM: Magnesium: 1.9 mg/dL (ref 1.7–2.4)

## 2023-01-31 MED ORDER — CETIRIZINE HCL 10 MG/ML IV SOLN
10.0000 mg | Freq: Once | INTRAVENOUS | Status: AC
  Administered 2023-01-31: 10 mg via INTRAVENOUS
  Filled 2023-01-31: qty 1

## 2023-01-31 MED ORDER — SODIUM CHLORIDE 0.9 % IV SOLN
64.0000 mg/m2 | Freq: Once | INTRAVENOUS | Status: AC
  Administered 2023-01-31: 126 mg via INTRAVENOUS
  Filled 2023-01-31: qty 21

## 2023-01-31 MED ORDER — HEPARIN SOD (PORK) LOCK FLUSH 100 UNIT/ML IV SOLN
500.0000 [IU] | Freq: Once | INTRAVENOUS | Status: AC | PRN
  Administered 2023-01-31: 500 [IU]

## 2023-01-31 MED ORDER — FAMOTIDINE IN NACL 20-0.9 MG/50ML-% IV SOLN
20.0000 mg | Freq: Once | INTRAVENOUS | Status: AC
  Administered 2023-01-31: 20 mg via INTRAVENOUS
  Filled 2023-01-31: qty 50

## 2023-01-31 MED ORDER — SODIUM CHLORIDE 0.9 % IV SOLN: Freq: Once | INTRAVENOUS | Status: AC

## 2023-01-31 MED ORDER — SODIUM CHLORIDE 0.9% FLUSH
10.0000 mL | INTRAVENOUS | Status: DC | PRN
  Administered 2023-01-31: 10 mL

## 2023-01-31 MED ORDER — SODIUM CHLORIDE 0.9 % IV SOLN
10.0000 mg | Freq: Once | INTRAVENOUS | Status: AC
  Administered 2023-01-31: 10 mg via INTRAVENOUS
  Filled 2023-01-31: qty 10

## 2023-01-31 MED ORDER — SODIUM CHLORIDE 0.9% FLUSH
10.0000 mL | Freq: Once | INTRAVENOUS | Status: AC
  Administered 2023-01-31: 10 mL via INTRAVENOUS

## 2023-01-31 MED ORDER — ACETAMINOPHEN 325 MG PO TABS
650.0000 mg | ORAL_TABLET | Freq: Once | ORAL | Status: AC
  Administered 2023-01-31: 650 mg via ORAL
  Filled 2023-01-31: qty 2

## 2023-01-31 MED ORDER — TRASTUZUMAB-ANNS CHEMO 420 MG IV SOLR
2.0000 mg/kg | Freq: Once | INTRAVENOUS | Status: AC
  Administered 2023-01-31: 168 mg via INTRAVENOUS
  Filled 2023-01-31: qty 8

## 2023-01-31 NOTE — Progress Notes (Signed)
Patient presents today for Kanjinti and Taxol infusion. Patient in satisfactory condition with no new complaints voiced. Vital signs stable. All labs within treatment parameters. We will proceed with treatment per MD orders.   Patient tolerated treatment well with no complaints voiced.  Patient left ambulatory in stable condition.  Vital signs stable at discharge.  Follow up as scheduled.

## 2023-01-31 NOTE — Patient Instructions (Signed)
[facility]  Discharge Instructions: Thank you for choosing [facility] to provide your oncology and hematology care.  If you have a lab appointment with the [facility], please come in thru the Main Entrance and check in at the main information desk.  Wear comfortable clothing and clothing appropriate for easy access to any Portacath or PICC line.   We strive to give you quality time with your provider. You may need to reschedule your appointment if you arrive late (15 or more minutes).  Arriving late affects you and other patients whose appointments are after yours.  Also, if you miss three or more appointments without notifying the office, you may be dismissed from the clinic at the provider's discretion.      For prescription refill requests, have your pharmacy contact our office and allow 72 hours for refills to be completed.    Today you received the following chemotherapy and/or immunotherapy agents Kanjinti and ToxolPaclitaxel Injection What is this medication? PACLITAXEL (PAK li TAX el) treats some types of cancer. It works by slowing down the growth of cancer cells. This medicine may be used for other purposes; ask your health care provider or pharmacist if you have questions. COMMON BRAND NAME(S): Onxol, Taxol What should I tell my care team before I take this medication? They need to know if you have any of these conditions: Heart disease Liver disease Low white blood cell levels An unusual or allergic reaction to paclitaxel, other medications, foods, dyes, or preservatives If you or your partner are pregnant or trying to get pregnant Breast-feeding How should I use this medication? This medication is injected into a vein. It is given by your care team in a hospital or clinic setting. Talk to your care team about the use of this medication in children. While it may be given to children for selected conditions, precautions do  apply. Overdosage: If you think you have taken too much of this medicine contact a poison control center or emergency room at once. NOTE: This medicine is only for you. Do not share this medicine with others. What if I miss a dose? Keep appointments for follow-up doses. It is important not to miss your dose. Call your care team if you are unable to keep an appointment. What may interact with this medication? Do not take this medication with any of the following: Live virus vaccines Other medications may affect the way this medication works. Talk with your care team about all of the medications you take. They may suggest changes to your treatment plan to lower the risk of side effects and to make sure your medications work as intended. This list may not describe all possible interactions. Give your health care provider a list of all the medicines, herbs, non-prescription drugs, or dietary supplements you use. Also tell them if you smoke, drink alcohol, or use illegal drugs. Some items may interact with your medicine. What should I watch for while using this medication? Your condition will be monitored carefully while you are receiving this medication. You may need blood work while taking this medication. This medication may make you feel generally unwell. This is not uncommon as chemotherapy can affect healthy cells as well as cancer cells. Report any side effects. Continue your course of treatment even though you feel ill unless your care team tells you to stop. This medication can cause serious allergic reactions. To reduce the risk, your care team may give you other medications to  take before receiving this one. Be sure to follow the directions from your care team. This medication may increase your risk of getting an infection. Call your care team for advice if you get a fever, chills, sore throat, or other symptoms of a cold or flu. Do not treat yourself. Try to avoid being around people who are  sick. This medication may increase your risk to bruise or bleed. Call your care team if you notice any unusual bleeding. Be careful brushing or flossing your teeth or using a toothpick because you may get an infection or bleed more easily. If you have any dental work done, tell your dentist you are receiving this medication. Talk to your care team if you may be pregnant. Serious birth defects can occur if you take this medication during pregnancy. Talk to your care team before breastfeeding. Changes to your treatment plan may be needed. What side effects may I notice from receiving this medication? Side effects that you should report to your care team as soon as possible: Allergic reactions--skin rash, itching, hives, swelling of the face, lips, tongue, or throat Heart rhythm changes--fast or irregular heartbeat, dizziness, feeling faint or lightheaded, chest pain, trouble breathing Increase in blood pressure Infection--fever, chills, cough, sore throat, wounds that don't heal, pain or trouble when passing urine, general feeling of discomfort or being unwell Low blood pressure--dizziness, feeling faint or lightheaded, blurry vision Low red blood cell level--unusual weakness or fatigue, dizziness, headache, trouble breathing Painful swelling, warmth, or redness of the skin, blisters or sores at the infusion site Pain, tingling, or numbness in the hands or feet Slow heartbeat--dizziness, feeling faint or lightheaded, confusion, trouble breathing, unusual weakness or fatigue Unusual bruising or bleeding Side effects that usually do not require medical attention (report to your care team if they continue or are bothersome): Diarrhea Hair loss Joint pain Loss of appetite Muscle pain Nausea Vomiting This list may not describe all possible side effects. Call your doctor for medical advice about side effects. You may report side effects to FDA at 1-800-FDA-1088. Where should I keep my  medication? This medication is given in a hospital or clinic. It will not be stored at home. NOTE: This sheet is a summary. It may not cover all possible information. If you have questions about this medicine, talk to your doctor, pharmacist, or health care provider.  2023 Elsevier/Gold Standard (2022-03-17 00:00:00) Trastuzumab Injection What is this medication? TRASTUZUMAB (tras TOO zoo mab) treats breast cancer and stomach cancer. It works by blocking a protein that causes cancer cells to grow and multiply. This helps to slow or stop the spread of cancer cells. This medicine may be used for other purposes; ask your health care provider or pharmacist if you have questions. COMMON BRAND NAME(S): Herceptin, Janae Bridgeman, Ontruzant, Trazimera What should I tell my care team before I take this medication? They need to know if you have any of these conditions: Heart failure Lung disease An unusual or allergic reaction to trastuzumab, other medications, foods, dyes, or preservatives Pregnant or trying to get pregnant Breast-feeding How should I use this medication? This medication is injected into a vein. It is given by your care team in a hospital or clinic setting. Talk to your care team about the use of this medication in children. It is not approved for use in children. Overdosage: If you think you have taken too much of this medicine contact a poison control center or emergency room at once. NOTE: This medicine  is only for you. Do not share this medicine with others. What if I miss a dose? Keep appointments for follow-up doses. It is important not to miss your dose. Call your care team if you are unable to keep an appointment. What may interact with this medication? Certain types of chemotherapy, such as daunorubicin, doxorubicin, epirubicin, idarubicin This list may not describe all possible interactions. Give your health care provider a list of all the medicines, herbs,  non-prescription drugs, or dietary supplements you use. Also tell them if you smoke, drink alcohol, or use illegal drugs. Some items may interact with your medicine. What should I watch for while using this medication? Your condition will be monitored carefully while you are receiving this medication. This medication may make you feel generally unwell. This is not uncommon, as chemotherapy affects healthy cells as well as cancer cells. Report any side effects. Continue your course of treatment even though you feel ill unless your care team tells you to stop. This medication may increase your risk of getting an infection. Call your care team for advice if you get a fever, chills, sore throat, or other symptoms of a cold or flu. Do not treat yourself. Try to avoid being around people who are sick. Avoid taking medications that contain aspirin, acetaminophen, ibuprofen, naproxen, or ketoprofen unless instructed by your care team. These medications can hide a fever. Talk to your care team if you may be pregnant. Serious birth defects can occur if you take this medication during pregnancy and for 7 months after the last dose. You will need a negative pregnancy test before starting this medication. Contraception is recommended while taking this medication and for 7 months after the last dose. Your care team can help you find the option that works for you. Do not breastfeed while taking this medication and for 7 months after stopping treatment. What side effects may I notice from receiving this medication? Side effects that you should report to your care team as soon as possible: Allergic reactions or angioedema--skin rash, itching or hives, swelling of the face, eyes, lips, tongue, arms, or legs, trouble swallowing or breathing Dry cough, shortness of breath or trouble breathing Heart failure--shortness of breath, swelling of the ankles, feet, or hands, sudden weight gain, unusual weakness or  fatigue Infection--fever, chills, cough, or sore throat Infusion reactions--chest pain, shortness of breath or trouble breathing, feeling faint or lightheaded Side effects that usually do not require medical attention (report to your care team if they continue or are bothersome): Diarrhea Dizziness Headache Nausea Trouble sleeping Vomiting This list may not describe all possible side effects. Call your doctor for medical advice about side effects. You may report side effects to FDA at 1-800-FDA-1088. Where should I keep my medication? This medication is given in a hospital or clinic. It will not be stored at home. NOTE: This sheet is a summary. It may not cover all possible information. If you have questions about this medicine, talk to your doctor, pharmacist, or health care provider.  2023 Elsevier/Gold Standard (2022-03-03 00:00:00)       To help prevent nausea and vomiting after your treatment, we encourage you to take your nausea medication as directed.  BELOW ARE SYMPTOMS THAT SHOULD BE REPORTED IMMEDIATELY: *FEVER GREATER THAN 100.4 F (38 C) OR HIGHER *CHILLS OR SWEATING *NAUSEA AND VOMITING THAT IS NOT CONTROLLED WITH YOUR NAUSEA MEDICATION *UNUSUAL SHORTNESS OF BREATH *UNUSUAL BRUISING OR BLEEDING *URINARY PROBLEMS (pain or burning when urinating, or frequent urination) *BOWEL  PROBLEMS (unusual diarrhea, constipation, pain near the anus) TENDERNESS IN MOUTH AND THROAT WITH OR WITHOUT PRESENCE OF ULCERS (sore throat, sores in mouth, or a toothache) UNUSUAL RASH, SWELLING OR PAIN  UNUSUAL VAGINAL DISCHARGE OR ITCHING   Items with * indicate a potential emergency and should be followed up as soon as possible or go to the Emergency Department if any problems should occur.  Please show the CHEMOTHERAPY ALERT CARD or IMMUNOTHERAPY ALERT CARD at check-in to the Emergency Department and triage nurse.  Should you have questions after your visit or need to cancel or reschedule  your appointment, please contact [facility] [phone]  and follow the prompts.  Office hours are 8:00 a.m. to 4:30 p.m. Monday - Friday. Please note that voicemails left after 4:00 p.m. may not be returned until the following business day.  We are closed weekends and major holidays. You have access to a nurse at all times for urgent questions. Please call the main number to the clinic [phone] and follow the prompts.  For any non-urgent questions, you may also contact your provider using MyChart. We now offer e-Visits for anyone 71 and older to request care online for non-urgent symptoms. For details visit [email or web address].   Also download the MyChart app! Go to the app store, search "MyChart", open the app, select [facility], and log in with your MyChart username and password.

## 2023-02-03 ENCOUNTER — Ambulatory Visit (HOSPITAL_COMMUNITY)
Admission: RE | Admit: 2023-02-03 | Discharge: 2023-02-03 | Disposition: A | Discharge status: Home / Self Care | Payer: PPO | Source: Ambulatory Visit | Attending: Hematology | Admitting: Hematology

## 2023-02-03 ENCOUNTER — Encounter: Payer: Self-pay | Admitting: Hematology

## 2023-02-03 DIAGNOSIS — Z01818 Encounter for other preprocedural examination: Secondary | ICD-10-CM | POA: Insufficient documentation

## 2023-02-03 DIAGNOSIS — Z170 Estrogen receptor positive status [ER+]: Secondary | ICD-10-CM

## 2023-02-03 DIAGNOSIS — Z9012 Acquired absence of left breast and nipple: Secondary | ICD-10-CM | POA: Insufficient documentation

## 2023-02-03 DIAGNOSIS — C50412 Malignant neoplasm of upper-outer quadrant of left female breast: Secondary | ICD-10-CM | POA: Diagnosis not present

## 2023-02-03 DIAGNOSIS — I10 Essential (primary) hypertension: Secondary | ICD-10-CM | POA: Insufficient documentation

## 2023-02-03 DIAGNOSIS — Z0189 Encounter for other specified special examinations: Secondary | ICD-10-CM | POA: Diagnosis not present

## 2023-02-03 LAB — ECHOCARDIOGRAM COMPLETE
AR max vel: 1.93 cm2
AV Area VTI: 1.8 cm2
AV Area mean vel: 1.79 cm2
AV Mean grad: 9 mmHg
AV Peak grad: 16.6 mmHg
Ao pk vel: 2.04 m/s
Area-P 1/2: 3.91 cm2
Calc EF: 65 %
MV M vel: 5.84 m/s
MV Peak grad: 136.4 mmHg
S' Lateral: 2.3 cm
Single Plane A2C EF: 62.8 %
Single Plane A4C EF: 68.2 %

## 2023-02-03 NOTE — Progress Notes (Signed)
*  PRELIMINARY RESULTS* Echocardiogram 2D Echocardiogram has been performed.  Haley Peterson 02/03/2023, 12:29 PM

## 2023-02-07 ENCOUNTER — Inpatient Hospital Stay: Payer: PPO

## 2023-02-07 ENCOUNTER — Inpatient Hospital Stay (HOSPITAL_BASED_OUTPATIENT_CLINIC_OR_DEPARTMENT_OTHER): Payer: PPO | Admitting: Hematology

## 2023-02-07 VITALS — BP 138/93 | HR 90 | Temp 99.1°F | Resp 18

## 2023-02-07 DIAGNOSIS — Z95828 Presence of other vascular implants and grafts: Secondary | ICD-10-CM

## 2023-02-07 DIAGNOSIS — Z170 Estrogen receptor positive status [ER+]: Secondary | ICD-10-CM

## 2023-02-07 DIAGNOSIS — D508 Other iron deficiency anemias: Secondary | ICD-10-CM | POA: Diagnosis not present

## 2023-02-07 DIAGNOSIS — C50412 Malignant neoplasm of upper-outer quadrant of left female breast: Secondary | ICD-10-CM | POA: Diagnosis not present

## 2023-02-07 DIAGNOSIS — Z5112 Encounter for antineoplastic immunotherapy: Secondary | ICD-10-CM | POA: Diagnosis not present

## 2023-02-07 LAB — COMPREHENSIVE METABOLIC PANEL
ALT: 21 U/L (ref 0–44)
AST: 17 U/L (ref 15–41)
Albumin: 3.3 g/dL — ABNORMAL LOW (ref 3.5–5.0)
Alkaline Phosphatase: 53 U/L (ref 38–126)
Anion gap: 7 (ref 5–15)
BUN: 24 mg/dL — ABNORMAL HIGH (ref 8–23)
CO2: 25 mmol/L (ref 22–32)
Calcium: 8.9 mg/dL (ref 8.9–10.3)
Chloride: 106 mmol/L (ref 98–111)
Creatinine, Ser: 1.1 mg/dL — ABNORMAL HIGH (ref 0.44–1.00)
GFR, Estimated: 52 mL/min — ABNORMAL LOW (ref >60)
Glucose, Bld: 111 mg/dL — ABNORMAL HIGH (ref 70–99)
Potassium: 4 mmol/L (ref 3.5–5.1)
Sodium: 138 mmol/L (ref 135–145)
Total Bilirubin: 0.4 mg/dL (ref 0.3–1.2)
Total Protein: 5.9 g/dL — ABNORMAL LOW (ref 6.5–8.1)

## 2023-02-07 LAB — CBC WITH DIFFERENTIAL/PLATELET
Abs Immature Granulocytes: 0.04 10*3/uL (ref 0.00–0.07)
Basophils Absolute: 0.1 10*3/uL (ref 0.0–0.1)
Basophils Relative: 1 %
Eosinophils Absolute: 0.3 10*3/uL (ref 0.0–0.5)
Eosinophils Relative: 5 %
HCT: 33.5 % — ABNORMAL LOW (ref 36.0–46.0)
Hemoglobin: 10.8 g/dL — ABNORMAL LOW (ref 12.0–15.0)
Immature Granulocytes: 1 %
Lymphocytes Relative: 18 %
Lymphs Abs: 1.1 10*3/uL (ref 0.7–4.0)
MCH: 29.9 pg (ref 26.0–34.0)
MCHC: 32.2 g/dL (ref 30.0–36.0)
MCV: 92.8 fL (ref 80.0–100.0)
Monocytes Absolute: 0.6 10*3/uL (ref 0.1–1.0)
Monocytes Relative: 9 %
Neutro Abs: 4 10*3/uL (ref 1.7–7.7)
Neutrophils Relative %: 66 %
Platelets: 359 10*3/uL (ref 150–400)
RBC: 3.61 MIL/uL — ABNORMAL LOW (ref 3.87–5.11)
RDW: 14.8 % (ref 11.5–15.5)
WBC: 6.2 10*3/uL (ref 4.0–10.5)
nRBC: 0 % (ref 0.0–0.2)

## 2023-02-07 LAB — MAGNESIUM: Magnesium: 2 mg/dL (ref 1.7–2.4)

## 2023-02-07 MED ORDER — SODIUM CHLORIDE 0.9 % IV SOLN: Freq: Once | INTRAVENOUS | Status: AC

## 2023-02-07 MED ORDER — SODIUM CHLORIDE 0.9% FLUSH
10.0000 mL | INTRAVENOUS | Status: AC | PRN
  Administered 2023-02-07: 10 mL

## 2023-02-07 MED ORDER — SODIUM CHLORIDE 0.9 % IV SOLN
64.0000 mg/m2 | Freq: Once | INTRAVENOUS | Status: AC
  Administered 2023-02-07: 126 mg via INTRAVENOUS
  Filled 2023-02-07: qty 21

## 2023-02-07 MED ORDER — TRASTUZUMAB-ANNS CHEMO 420 MG IV SOLR
2.0000 mg/kg | Freq: Once | INTRAVENOUS | Status: AC
  Administered 2023-02-07: 168 mg via INTRAVENOUS
  Filled 2023-02-07: qty 8

## 2023-02-07 MED ORDER — SODIUM CHLORIDE 0.9 % IV SOLN
10.0000 mg | Freq: Once | INTRAVENOUS | Status: AC
  Administered 2023-02-07: 10 mg via INTRAVENOUS
  Filled 2023-02-07: qty 10

## 2023-02-07 MED ORDER — HEPARIN SOD (PORK) LOCK FLUSH 100 UNIT/ML IV SOLN
500.0000 [IU] | Freq: Once | INTRAVENOUS | Status: AC | PRN
  Administered 2023-02-07: 500 [IU]

## 2023-02-07 MED ORDER — SODIUM CHLORIDE 0.9% FLUSH
10.0000 mL | Freq: Once | INTRAVENOUS | Status: AC
  Administered 2023-02-07: 10 mL

## 2023-02-07 MED ORDER — FAMOTIDINE IN NACL 20-0.9 MG/50ML-% IV SOLN
20.0000 mg | Freq: Once | INTRAVENOUS | Status: AC
  Administered 2023-02-07: 20 mg via INTRAVENOUS
  Filled 2023-02-07: qty 50

## 2023-02-07 MED ORDER — ACETAMINOPHEN 325 MG PO TABS
650.0000 mg | ORAL_TABLET | Freq: Once | ORAL | Status: AC
  Administered 2023-02-07: 650 mg via ORAL
  Filled 2023-02-07: qty 2

## 2023-02-07 MED ORDER — CETIRIZINE HCL 10 MG/ML IV SOLN
10.0000 mg | Freq: Once | INTRAVENOUS | Status: AC
  Administered 2023-02-07: 10 mg via INTRAVENOUS
  Filled 2023-02-07: qty 1

## 2023-02-07 NOTE — Progress Notes (Signed)
Patient tolerated chemotherapy treatment well with no complaints voiced.  Patient left ambulatory in stable condition.  Vital signs stable at discharge.  Follow up as scheduled.

## 2023-02-07 NOTE — Patient Instructions (Addendum)
[facility] at [facility] Discharge Instructions   You were seen and examined today by Dr. Delton Coombes.  He reviewed the results of your lab work which are normal/stable.   He reviewed the results of your echocardiogram which was normal.   We will proceed with your treatment today.   The acid reflux you are experiencing may be coming from treatment with chemotherapy. Dr. Raliegh Ip recommends over-the-counter Pepcid 20 mg twice a day to help with this.   Return as scheduled.    Thank you for choosing [facility] at [facility] to provide your oncology and hematology care.  To afford each patient quality time with our provider, please arrive at least 15 minutes before your scheduled appointment time.   If you have a lab appointment with the [facility] please come in thru the Main Entrance and check in at the main information desk.  You need to re-schedule your appointment should you arrive 10 or more minutes late.  We strive to give you quality time with our providers, and arriving late affects you and other patients whose appointments are after yours.  Also, if you no show three or more times for appointments you may be dismissed from the clinic at the providers discretion.     Again, thank you for choosing [facility].  Our hope is that these requests will decrease the amount of time that you wait before being seen by our physicians.       _____________________________________________________________  Should you have questions after your visit to [facility], please contact our office at [phone] and follow the prompts.  Our office hours are 8:00 a.m. and 4:30 p.m. Monday - Friday.  Please note that voicemails left after 4:00 p.m. may not be returned until the following business day.  We are closed weekends and major holidays.  You do have access to a nurse 24-7, just call the main number to the clinic [phone] and do  not press any options, hold on the line and a nurse will answer the phone.    For prescription refill requests, have your pharmacy contact our office and allow 72 hours.    Due to Covid, you will need to wear a mask upon entering the hospital. If you do not have a mask, a mask will be given to you at the Main Entrance upon arrival. For doctor visits, patients may have 1 support person age 61 or older with them. For treatment visits, patients can not have anyone with them due to social distancing guidelines and our immunocompromised population.

## 2023-02-07 NOTE — Patient Instructions (Signed)
[facility]  Discharge Instructions: Thank you for choosing [facility] to provide your oncology and hematology care.  If you have a lab appointment with the [facility], please come in thru the Main Entrance and check in at the main information desk.  Wear comfortable clothing and clothing appropriate for easy access to any Portacath or PICC line.   We strive to give you quality time with your provider. You may need to reschedule your appointment if you arrive late (15 or more minutes).  Arriving late affects you and other patients whose appointments are after yours.  Also, if you miss three or more appointments without notifying the office, you may be dismissed from the clinic at the provider's discretion.      For prescription refill requests, have your pharmacy contact our office and allow 72 hours for refills to be completed.    Today you received the following chemotherapy and/or immunotherapy agents Kanjinti/Taxol.  Trastuzumab Injection What is this medication? TRASTUZUMAB (tras TOO zoo mab) treats breast cancer and stomach cancer. It works by blocking a protein that causes cancer cells to grow and multiply. This helps to slow or stop the spread of cancer cells. This medicine may be used for other purposes; ask your health care provider or pharmacist if you have questions. COMMON BRAND NAME(S): Herceptin, Janae Bridgeman, Ontruzant, Trazimera What should I tell my care team before I take this medication? They need to know if you have any of these conditions: Heart failure Lung disease An unusual or allergic reaction to trastuzumab, other medications, foods, dyes, or preservatives Pregnant or trying to get pregnant Breast-feeding How should I use this medication? This medication is injected into a vein. It is given by your care team in a hospital or clinic setting. Talk to your care team about the use of this medication in children. It  is not approved for use in children. Overdosage: If you think you have taken too much of this medicine contact a poison control center or emergency room at once. NOTE: This medicine is only for you. Do not share this medicine with others. What if I miss a dose? Keep appointments for follow-up doses. It is important not to miss your dose. Call your care team if you are unable to keep an appointment. What may interact with this medication? Certain types of chemotherapy, such as daunorubicin, doxorubicin, epirubicin, idarubicin This list may not describe all possible interactions. Give your health care provider a list of all the medicines, herbs, non-prescription drugs, or dietary supplements you use. Also tell them if you smoke, drink alcohol, or use illegal drugs. Some items may interact with your medicine. What should I watch for while using this medication? Your condition will be monitored carefully while you are receiving this medication. This medication may make you feel generally unwell. This is not uncommon, as chemotherapy affects healthy cells as well as cancer cells. Report any side effects. Continue your course of treatment even though you feel ill unless your care team tells you to stop. This medication may increase your risk of getting an infection. Call your care team for advice if you get a fever, chills, sore throat, or other symptoms of a cold or flu. Do not treat yourself. Try to avoid being around people who are sick. Avoid taking medications that contain aspirin, acetaminophen, ibuprofen, naproxen, or ketoprofen unless instructed by your care team. These medications can hide a fever. Talk to your care team if  you may be pregnant. Serious birth defects can occur if you take this medication during pregnancy and for 7 months after the last dose. You will need a negative pregnancy test before starting this medication. Contraception is recommended while taking this medication and for 7  months after the last dose. Your care team can help you find the option that works for you. Do not breastfeed while taking this medication and for 7 months after stopping treatment. What side effects may I notice from receiving this medication? Side effects that you should report to your care team as soon as possible: Allergic reactions or angioedema--skin rash, itching or hives, swelling of the face, eyes, lips, tongue, arms, or legs, trouble swallowing or breathing Dry cough, shortness of breath or trouble breathing Heart failure--shortness of breath, swelling of the ankles, feet, or hands, sudden weight gain, unusual weakness or fatigue Infection--fever, chills, cough, or sore throat Infusion reactions--chest pain, shortness of breath or trouble breathing, feeling faint or lightheaded Side effects that usually do not require medical attention (report to your care team if they continue or are bothersome): Diarrhea Dizziness Headache Nausea Trouble sleeping Vomiting This list may not describe all possible side effects. Call your doctor for medical advice about side effects. You may report side effects to FDA at 1-800-FDA-1088. Where should I keep my medication? This medication is given in a hospital or clinic. It will not be stored at home. NOTE: This sheet is a summary. It may not cover all possible information. If you have questions about this medicine, talk to your doctor, pharmacist, or health care provider.  2023 Elsevier/Gold Standard (2022-03-03 00:00:00)    Paclitaxel Injection What is this medication? PACLITAXEL (PAK li TAX el) treats some types of cancer. It works by slowing down the growth of cancer cells. This medicine may be used for other purposes; ask your health care provider or pharmacist if you have questions. COMMON BRAND NAME(S): Onxol, Taxol What should I tell my care team before I take this medication? They need to know if you have any of these conditions: Heart  disease Liver disease Low white blood cell levels An unusual or allergic reaction to paclitaxel, other medications, foods, dyes, or preservatives If you or your partner are pregnant or trying to get pregnant Breast-feeding How should I use this medication? This medication is injected into a vein. It is given by your care team in a hospital or clinic setting. Talk to your care team about the use of this medication in children. While it may be given to children for selected conditions, precautions do apply. Overdosage: If you think you have taken too much of this medicine contact a poison control center or emergency room at once. NOTE: This medicine is only for you. Do not share this medicine with others. What if I miss a dose? Keep appointments for follow-up doses. It is important not to miss your dose. Call your care team if you are unable to keep an appointment. What may interact with this medication? Do not take this medication with any of the following: Live virus vaccines Other medications may affect the way this medication works. Talk with your care team about all of the medications you take. They may suggest changes to your treatment plan to lower the risk of side effects and to make sure your medications work as intended. This list may not describe all possible interactions. Give your health care provider a list of all the medicines, herbs, non-prescription drugs, or dietary supplements  you use. Also tell them if you smoke, drink alcohol, or use illegal drugs. Some items may interact with your medicine. What should I watch for while using this medication? Your condition will be monitored carefully while you are receiving this medication. You may need blood work while taking this medication. This medication may make you feel generally unwell. This is not uncommon as chemotherapy can affect healthy cells as well as cancer cells. Report any side effects. Continue your course of treatment even  though you feel ill unless your care team tells you to stop. This medication can cause serious allergic reactions. To reduce the risk, your care team may give you other medications to take before receiving this one. Be sure to follow the directions from your care team. This medication may increase your risk of getting an infection. Call your care team for advice if you get a fever, chills, sore throat, or other symptoms of a cold or flu. Do not treat yourself. Try to avoid being around people who are sick. This medication may increase your risk to bruise or bleed. Call your care team if you notice any unusual bleeding. Be careful brushing or flossing your teeth or using a toothpick because you may get an infection or bleed more easily. If you have any dental work done, tell your dentist you are receiving this medication. Talk to your care team if you may be pregnant. Serious birth defects can occur if you take this medication during pregnancy. Talk to your care team before breastfeeding. Changes to your treatment plan may be needed. What side effects may I notice from receiving this medication? Side effects that you should report to your care team as soon as possible: Allergic reactions--skin rash, itching, hives, swelling of the face, lips, tongue, or throat Heart rhythm changes--fast or irregular heartbeat, dizziness, feeling faint or lightheaded, chest pain, trouble breathing Increase in blood pressure Infection--fever, chills, cough, sore throat, wounds that don't heal, pain or trouble when passing urine, general feeling of discomfort or being unwell Low blood pressure--dizziness, feeling faint or lightheaded, blurry vision Low red blood cell level--unusual weakness or fatigue, dizziness, headache, trouble breathing Painful swelling, warmth, or redness of the skin, blisters or sores at the infusion site Pain, tingling, or numbness in the hands or feet Slow heartbeat--dizziness, feeling faint or  lightheaded, confusion, trouble breathing, unusual weakness or fatigue Unusual bruising or bleeding Side effects that usually do not require medical attention (report to your care team if they continue or are bothersome): Diarrhea Hair loss Joint pain Loss of appetite Muscle pain Nausea Vomiting This list may not describe all possible side effects. Call your doctor for medical advice about side effects. You may report side effects to FDA at 1-800-FDA-1088. Where should I keep my medication? This medication is given in a hospital or clinic. It will not be stored at home. NOTE: This sheet is a summary. It may not cover all possible information. If you have questions about this medicine, talk to your doctor, pharmacist, or health care provider.  2023 Elsevier/Gold Standard (2022-03-17 00:00:00)        To help prevent nausea and vomiting after your treatment, we encourage you to take your nausea medication as directed.  BELOW ARE SYMPTOMS THAT SHOULD BE REPORTED IMMEDIATELY: *FEVER GREATER THAN 100.4 F (38 C) OR HIGHER *CHILLS OR SWEATING *NAUSEA AND VOMITING THAT IS NOT CONTROLLED WITH YOUR NAUSEA MEDICATION *UNUSUAL SHORTNESS OF BREATH *UNUSUAL BRUISING OR BLEEDING *URINARY PROBLEMS (pain or burning when urinating,  or frequent urination) *BOWEL PROBLEMS (unusual diarrhea, constipation, pain near the anus) TENDERNESS IN MOUTH AND THROAT WITH OR WITHOUT PRESENCE OF ULCERS (sore throat, sores in mouth, or a toothache) UNUSUAL RASH, SWELLING OR PAIN  UNUSUAL VAGINAL DISCHARGE OR ITCHING   Items with * indicate a potential emergency and should be followed up as soon as possible or go to the Emergency Department if any problems should occur.  Please show the CHEMOTHERAPY ALERT CARD or IMMUNOTHERAPY ALERT CARD at check-in to the Emergency Department and triage nurse.  Should you have questions after your visit or need to cancel or reschedule your appointment, please contact [facility] [phone]  and follow the prompts.  Office hours are 8:00 a.m. to 4:30 p.m. Monday - Friday. Please note that voicemails left after 4:00 p.m. may not be returned until the following business day.  We are closed weekends and major holidays. You have access to a nurse at all times for urgent questions. Please call the main number to the clinic [phone] and follow the prompts.  For any non-urgent questions, you may also contact your provider using MyChart. We now offer e-Visits for anyone 57 and older to request care online for non-urgent symptoms. For details visit [email or web address].   Also download the MyChart app! Go to the app store, search "MyChart", open the app, select [facility], and log in with your MyChart username and password.

## 2023-02-07 NOTE — Progress Notes (Signed)
[facility] [address], [place] [address]    Clinic Day:  02/07/2023  Referring physician: Celene Squibb, MD  Patient Care Team: Celene Squibb, MD as PCP - General (Internal Medicine) Bonne Dolores, MD (Inactive) (Internal Medicine) Neldon Mc, MD (Inactive) as Surgeon (General Surgery) Derek Jack, MD as Medical Oncologist (Medical Oncology) Brien Mates, RN as Oncology Nurse Navigator (Medical Oncology)   ASSESSMENT & PLAN:   Assessment: 1.  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  In the 2:00 location 3 cm from the nipple, mass measures 0.8 x 0.9 x 0.9 cm.  Measured together, these lesions span 2.4 cm in the 2 o'clock position.  Left axilla is negative for adenopathy. - Biopsy (09/22/2022): 1. left breast 2:00, 1 cmfn-invasive ductal carcinoma, grade 2, ER 100%, PR 95%, Ki-67 20%, HER2 2+ by IHC, HER2 FISH positive with ratio of 2.45 2.  Left breast 2:00 3 cmfn-invasive ductal carcinoma, grade 2, ER/PR 100%, Ki-67 15%, HER2 2+ by IHC, FISH negative - Left mastectomy, SLNB by Dr. Donne Hazel on 10/31/2022 - Pathology: 2.5 x 2.1 x 1.2 cm invasive moderately differentiated adenocarcinoma, grade 2, margins free, extensive lymphatic invasion.  0/1 lymph node positive.  Associated focal intermediate grade DCIS. - Adjuvant chemotherapy with weekly paclitaxel and Herceptin started on 12/13/2022   2.  Social/family history: - Lives at home by herself and is independent of ADLs and IADLs.  She worked as an Research officer, [employer].  Quit smoking 40 years ago. - Brother had thyroid cancer.   3.  Stage I right breast cancer: - Diagnosed on 05/15/2006, right breast 11 o'clock position - Right mastectomy (08/15/2006): 1.3  cm IDC, ER 83% positive, PR 84% positive, HER2 negative, Ki-67 4% - Tamoxifen followed by exemestane followed by letrozole for 10 years.   4.  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  Osteopenia: - Last bone density on 02/03/2021: T-score -1.9. - We will plan to obtain DEXA scan prior to initiation of AI.  Plan: 1.  Stage Ia (T2 N0) HER2 positive left breast cancer: - She has completed 8 weeks of paclitaxel and Herceptin. - She reported losing taste.  She has developed some heartburn lately.  She also reported hoarseness of voice in the last 3 weeks.  She feels more tired and taking naps.  Reports some clear to slightly yellow phlegm.  She was given Z-Pak 2 weeks ago due to head congestion by her PMD. - Reviewed labs today which showed normal LFTs.  CBC is normal.  Mild CKD is normal. - She will proceed with week 9 today and week 10 next week.  RTC 2 weeks for follow-up. - Will start her on Pepcid 20 mg twice daily for heartburn which could be contributing to hoarseness.  Will also check ferritin and iron panel at next visit.   2.  High risk drug monitoring: - 2D echo on 02/03/2023: LVEF 60 to 65% and stable.   3.  Peripheral neuropathy: - Baseline tingling in the lower legs and feet has been stable.  Orders Placed This Encounter  Procedures   Iron and TIBC ([facility] ONLY)    Standing Status:   Future  Standing Expiration Date:   02/07/2024   Ferritin    Standing Status:   Future    Standing Expiration Date:   02/07/2024    I,Alexis Herring,acting as a scribe for Derek Jack, MD.,have documented all relevant documentation on the behalf of Derek Jack, MD,as directed by  Derek Jack, MD while in the presence of Derek Jack, MD.  I, Derek Jack MD, have reviewed the above documentation for accuracy and completeness, and I agree with the above.    Derek Jack, MD   3/26/20246:26  PM  CHIEF COMPLAINT:   Diagnosis: stage Ib HER2 positive left breast cancer    Cancer Staging  Breast cancer of upper-outer quadrant of left female breast [facility] Staging form: Breast, AJCC 8th Edition - Clinical stage from 10/25/2022: Stage IB (cT2, cN0, cM0, G2, ER+, PR+, HER2+) - Unsigned    Prior Therapy: Left mastectomy and SLNB by Dr. Donne Hazel on 10/31/2022   Current Therapy:  Trastuzumab and Herceptin   HISTORY OF PRESENT ILLNESS:   Oncology History  Breast cancer of upper-outer quadrant of left female breast ([place])  10/25/2022 Initial Diagnosis   Breast cancer of upper-outer quadrant of left female breast ([place])   12/13/2022 -  Chemotherapy   Patient is on Treatment Plan : BREAST Paclitaxel + Trastuzumab q7d / Trastuzumab q21d        INTERVAL HISTORY:   Haley Peterson is a 77 y.o. female presenting to clinic today for follow up of stage Ib HER2 positive left breast cancer. She was last seen by me on 01/17/23.  She had an echo on 02/03/23 which showed LVEF of 60-65% and some MR.  Today, she states that she continues to have intermittent reflux symptoms and x3 weeks of hoarseness. She states that she has no prior history of heartburn. Her reflux is relieved with TUMS. She has not tried any other OTC medications for this. Her appetite level is at 70%. Her energy level is at 60%. She states that some foods taste different now. She has noticed increased fatigue on treatment. She reports left arm, right ankle, and right hand sores. She denies any new or worsening numbness/tingling since starting treatment. She reports having a productive cough with clear to yellow sputum. She states that she was on a z-pak a few weeks ago due to persistent sinus congestion/drainage, sinus pressure, and her productive cough. She states that she is having soft stools but no diarrhea.  PAST MEDICAL HISTORY:   Past Medical History: Past Medical History:  Diagnosis Date   Arthritis    Bone spur    left  heel   Breast cancer ([place])    bilateral   Cancer ([place])    Bilateral Breast cancer   Cataract    right eye    CKD (chronic kidney disease), stage III ([place])    mgd by PCP    History of breast cancer    left, right   Hypertension    Personal history of radiation therapy    Pneumonia 10/2012   Port-A-Cath in place 12/06/2022    Surgical History: Past Surgical History:  Procedure Laterality Date   ABDOMINAL HYSTERECTOMY     BIOPSY  04/12/2017   Procedure: BIOPSY;  Surgeon: Rogene Houston, MD;  [facility];  Service: Endoscopy;;  ascending colon ulcer biopsies   BREAST BIOPSY Left 09/22/2022   [place] LT BREAST BX W LOC DEV EA ADD LESION IMG BX SPEC US GUIDE 09/22/2022 [facility]   BREAST BIOPSY Left 09/22/2022  [place] LT BREAST BX W LOC DEV 1ST LESION IMG BX SPEC US GUIDE 09/22/2022 [facility]   BREAST LUMPECTOMY  08/28/2001   left   BREAST RECONSTRUCTION Right    implant   CHOLECYSTECTOMY  2002   COLONOSCOPY     COLONOSCOPY N/A 04/12/2017   Procedure: COLONOSCOPY;  Surgeon: Rogene Houston, MD;  [facility];  Service: Endoscopy;  Laterality: N/A;  830   MASTECTOMY  08/15/2006   right   PARTIAL HYSTERECTOMY  1985   PORTACATH PLACEMENT Right 10/31/2022   Procedure: INSERTION PORT-A-CATH;  Surgeon: [name], MD;  Location: [facility];  Service: General;  Laterality: Right;   REPLACEMENT TOTAL KNEE  2009, 2010   right-2010, left 2009   SIMPLE MASTECTOMY WITH AXILLARY SENTINEL NODE BIOPSY Left 10/31/2022   Procedure: LEFT MASTECTOMY;  Surgeon: [name], MD;  Location: [facility];  Service: General;  Laterality: Left;  90 MIN ROOM 2   TISSUE EXPANDER PLACEMENT     TOTAL HIP ARTHROPLASTY Right 10/09/2017   Procedure: RIGHT TOTAL HIP ARTHROPLASTY ANTERIOR APPROACH;  Surgeon: Frederik Pear, MD;  Location: [facility];  Service: Orthopedics;  Laterality: Right;   TOTAL HIP ARTHROPLASTY Left 09/23/2019    Procedure: Left Anterior Hip Arthroplasty;  Surgeon: Frederik Pear, MD;  Location: [facility];  Service: Orthopedics;  Laterality: Left;    Social History: Social History   Socioeconomic History   Marital status: Divorced    Spouse name: Not on file   Number of children: Not on file   Years of education: Not on file   Highest education level: Not on file  Occupational History   Not on file  Tobacco Use   Smoking status: Former    Packs/day: 0.50    Years: 5.00    Additional pack years: 0.00    Total pack years: 2.50    Types: Cigarettes    Quit date: 09/28/1983    Years since quitting: 39.3   Smokeless tobacco: Never  Vaping Use   Vaping Use: Never used  Substance and Sexual Activity   Alcohol use: No   Drug use: No   Sexual activity: Not on file  Other Topics Concern   Not on file  Social History Narrative   Not on file   Social Determinants of Health   Financial Resource Strain: Not on file  Food Insecurity: Not on file  Transportation Needs: Not on file  Physical Activity: Not on file  Stress: Not on file  Social Connections: Not on file  Intimate Partner Violence: Not on file    Family History: Family History  Problem Relation Age of Onset   Arthritis Mother    Thyroid cancer Brother        2010   Colon cancer Neg Hx    Breast cancer Neg Hx     Current Medications:  Current Outpatient Medications:    Ascorbic Acid (VITAMIN C) 1000 MG tablet, Take 1,000 mg by mouth daily with lunch., Disp: , Rfl:    Calcium-Vitamin D (CALTRATE 600 PLUS-VIT D PO), Take 1 tablet by mouth daily with lunch. , Disp: , Rfl:    Cholecalciferol (D-3-5) 125 MCG (5000 UT) capsule, Take 5,000 Units by mouth daily., Disp: , Rfl:    cholecalciferol (VITAMIN D) 1000 UNITS tablet, Take 1,000 Units by mouth daily with lunch. , Disp: , Rfl:    Garlic (GARLIQUE PO), Take 1 tablet by mouth daily with lunch., Disp: , Rfl:  hydrochlorothiazide (HYDRODIURIL) 12.5 MG tablet, Take 12.5 mg by  mouth daily., Disp: , Rfl:    losartan (COZAAR) 100 MG tablet, Take 100 mg by mouth daily., Disp: , Rfl:    NON FORMULARY, Balance of Nature - 3 fruits and 3 Vegetables, Disp: , Rfl:    Omega-3 Fatty Acids (FISH OIL) 1200 MG CPDR, Take by mouth., Disp: , Rfl:    PACLITAXEL IV, Inject into the vein once a week., Disp: , Rfl:    prochlorperazine (COMPAZINE) 10 MG tablet, Take 1 tablet (10 mg total) by mouth every 6 (six) hours as needed for nausea or vomiting., Disp: 60 tablet, Rfl: 3   TRASTUZUMAB IV, Inject into the vein once a week. Weekly x 12 weeks, then every 21 days, Disp: , Rfl:    TURMERIC PO, Take 3 capsules by mouth daily with lunch., Disp: , Rfl:    vitamin B-12 (CYANOCOBALAMIN) 100 MCG tablet, Take 100 mcg by mouth daily., Disp: , Rfl:    lidocaine-prilocaine (EMLA) cream, Apply a quarter sized amount to port a cath site and cover with plastic wrap 1 hour prior to infusion appointments (Patient not taking: Reported on 02/07/2023), Disp: 30 g, Rfl: 3 No current facility-administered medications for this visit.  Facility-Administered Medications Ordered in Other Visits:    sodium chloride flush (NS) 0.9 % injection 10 mL, 10 mL, Intracatheter, PRN, Derek Jack, MD, 10 mL at 02/07/23 1359   Allergies: Allergies  Allergen Reactions   Penicillins Other (See Comments)    "LOSS OF VISION"  Has patient had a PCN reaction causing immediate rash, facial/tongue/throat swelling, SOB or lightheadedness with hypotension: No Has patient had a PCN reaction causing severe rash involving mucus membranes or skin necrosis: No Has patient had a PCN reaction that required hospitalization: No Has patient had a PCN reaction occurring within the last 10 years: No If all of the above answers are "NO", then may proceed with Cephalosporin use.    Oxytetracycline Other (See Comments)    Fever blisters   Tamoxifen Other (See Comments)    "GENERAL ILLNESS WITH LIGHTHEADNESS"    Aromasin  [Exemestane] Other (See Comments)    UNSPECIFIED REACTION  Pt is unsure of what reaction was...   Codeine Other (See Comments)    LIGHTHEADED    REVIEW OF SYSTEMS:   Review of Systems  Constitutional:  Positive for fatigue. Negative for chills and fever.  HENT:   Positive for voice change (hoarseness). Negative for lump/mass, mouth sores, nosebleeds, sore throat and trouble swallowing.        + loss of taste  Eyes:  Negative for eye problems.  Respiratory:  Positive for cough. Negative for shortness of breath.   Cardiovascular:  Negative for chest pain, leg swelling and palpitations.  Gastrointestinal:  Positive for diarrhea (occasional loose stools). Negative for abdominal pain, constipation, nausea and vomiting.       + heartburn  Genitourinary:  Negative for bladder incontinence, difficulty urinating, dysuria, frequency, hematuria and nocturia.   Musculoskeletal:  Negative for arthralgias, back pain, flank pain, myalgias and neck pain.  Skin:  Negative for itching and rash.       + right hand, left arm and right ankle sores + discolored toenail  Neurological:  Positive for headaches (rare) and numbness. Negative for dizziness.  Hematological:  Does not bruise/bleed easily.  Psychiatric/Behavioral:  Positive for sleep disturbance. Negative for depression and suicidal ideas. The patient is not nervous/anxious.   All other systems reviewed and are negative.  VITALS:   There were no vitals taken for this visit.  Wt Readings from Last 3 Encounters:  02/07/23 184 lb (83.5 kg)  01/31/23 182 lb 8.7 oz (82.8 kg)  01/24/23 184 lb 9.6 oz (83.7 kg)    There is no height or weight on file to calculate BMI.  Performance status (ECOG): 1 - Symptomatic but completely ambulatory  PHYSICAL EXAM:   Physical Exam Vitals and nursing note reviewed. Exam conducted with a chaperone present.  Constitutional:      Appearance: Normal appearance.  Cardiovascular:     Rate and Rhythm: Normal  rate and regular rhythm.     Pulses: Normal pulses.     Heart sounds: Normal heart sounds.  Pulmonary:     Effort: Pulmonary effort is normal.     Breath sounds: Normal breath sounds.  Abdominal:     Palpations: Abdomen is soft. There is no hepatomegaly, splenomegaly or mass.     Tenderness: There is no abdominal tenderness.  Musculoskeletal:     Right lower leg: No edema.     Left lower leg: No edema.  Lymphadenopathy:     Cervical: No cervical adenopathy.     Right cervical: No superficial, deep or posterior cervical adenopathy.    Left cervical: No superficial, deep or posterior cervical adenopathy.     Upper Body:     Right upper body: No supraclavicular or axillary adenopathy.     Left upper body: No supraclavicular or axillary adenopathy.  Neurological:     General: No focal deficit present.     Mental Status: She is alert and oriented to person, place, and time.  Psychiatric:        Mood and Affect: Mood normal.        Behavior: Behavior normal.     LABS:      Latest Ref Rng & Units 02/07/2023    9:20 AM 01/31/2023    9:12 AM 01/24/2023   10:37 AM  CBC  WBC 4.0 - 10.5 K/uL 6.2  7.8  6.5   Hemoglobin 12.0 - 15.0 g/dL 10.8  10.8  10.5   Hematocrit 36.0 - 46.0 % 33.5  33.9  32.1   Platelets 150 - 400 K/uL 359  355  304       Latest Ref Rng & Units 02/07/2023    9:20 AM 01/31/2023    9:12 AM 01/24/2023   10:37 AM  CMP  Glucose 70 - 99 mg/dL 111  109  99   BUN 8 - 23 mg/dL 24  16  21    Creatinine 0.44 - 1.00 mg/dL 1.10  1.21  1.26   Sodium 135 - 145 mmol/L 138  138  136   Potassium 3.5 - 5.1 mmol/L 4.0  3.6  4.0   Chloride 98 - 111 mmol/L 106  106  103   CO2 22 - 32 mmol/L 25  25  27    Calcium 8.9 - 10.3 mg/dL 8.9  8.7  8.8   Total Protein 6.5 - 8.1 g/dL 5.9  6.3  6.0   Total Bilirubin 0.3 - 1.2 mg/dL 0.4  0.5  0.5   Alkaline Phos 38 - 126 U/L 53  57  56   AST 15 - 41 U/L 17  16  14    ALT 0 - 44 U/L 21  18  19       No results found for: "CEA1", "CEA" / No  results found for: "CEA1", "CEA" No results found for: "PSA1" No  results found for: "CAN199" No results found for: "CAN125"  Lab Results  Component Value Date   TOTALPROTELP 6.4 10/26/2022   ALBUMINELP 3.6 10/26/2022   A1GS 0.2 10/26/2022   A2GS 0.6 10/26/2022   BETS 1.1 10/26/2022   GAMS 0.9 10/26/2022   MSPIKE Not Observed 10/26/2022   SPEI Comment 10/26/2022   No results found for: "TIBC", "FERRITIN", "IRONPCTSAT" Lab Results  Component Value Date   LDH 140 06/27/2011     STUDIES:   ECHOCARDIOGRAM COMPLETE  Result Date: 02/03/2023    ECHOCARDIOGRAM REPORT   Patient Name:   Haley Peterson Date of Exam: 02/03/2023 Medical Rec #:  [id number]   Height:       64.0 in Accession #:    [id number]  Weight:       182.5 lb Date of Birth:  Nov 22, 1945    BSA:          1.882 m Patient Age:    48 years    BP:           156/50 mmHg Patient Gender: F           HR:           86 bpm. Exam Location:  [facility] Procedure: 2D Echo, Cardiac Doppler and Color Doppler Indications:    Chemo Z09  History:        Patient has prior history of Echocardiogram examinations, most                 recent 11/21/2022. Risk Factors:Hypertension. Breast cancer of                 upper-outer quadrant of left female breast, S/P left mastectomy.  Sonographer:    Alvino Chapel RCS Referring Phys: [phone] [place]  1. Left ventricular ejection fraction, by estimation, is 60 to 65%. The left ventricle has normal function. The left ventricle has no regional wall motion abnormalities. Left ventricular diastolic parameters were normal.  2. Right ventricular systolic function is normal. The right ventricular size is normal.  3. The mitral valve is abnormal. Mild mitral valve regurgitation. No evidence of mitral stenosis.  4. The aortic valve is tricuspid. There is mild calcification of the aortic valve. There is mild thickening of the aortic valve. Aortic valve regurgitation is not visualized. No aortic stenosis is  present.  5. The inferior vena cava is normal in size with greater than 50% respiratory variability, suggesting right atrial pressure of 3 mmHg. FINDINGS  Left Ventricle: Left ventricular ejection fraction, by estimation, is 60 to 65%. The left ventricle has normal function. The left ventricle has no regional wall motion abnormalities. The left ventricular internal cavity size was normal in size. There is  no left ventricular hypertrophy. Left ventricular diastolic parameters were normal. Right Ventricle: The right ventricular size is normal. Right vetricular wall thickness was not well visualized. Right ventricular systolic function is normal. Left Atrium: Left atrial size was normal in size. Right Atrium: Right atrial size was normal in size. Pericardium: There is no evidence of pericardial effusion. Mitral Valve: The mitral valve is abnormal. Mild mitral valve regurgitation. No evidence of mitral valve stenosis. Tricuspid Valve: The tricuspid valve is normal in structure. Tricuspid valve regurgitation is trivial. No evidence of tricuspid stenosis. Aortic Valve: The aortic valve is tricuspid. There is mild calcification of the aortic valve. There is mild thickening of the aortic valve. There is mild aortic valve annular calcification. Aortic valve regurgitation is not visualized.  No aortic stenosis  is present. Aortic valve mean gradient measures 9.0 mmHg. Aortic valve peak gradient measures 16.6 mmHg. Aortic valve area, by VTI measures 1.80 cm. Pulmonic Valve: The pulmonic valve was not well visualized. Pulmonic valve regurgitation is not visualized. No evidence of pulmonic stenosis. Aorta: The aortic root is normal in size and structure. Venous: The inferior vena cava is normal in size with greater than 50% respiratory variability, suggesting right atrial pressure of 3 mmHg. IAS/Shunts: No atrial level shunt detected by color flow Doppler.  LEFT VENTRICLE PLAX 2D LVIDd:         4.40 cm     Diastology LVIDs:          2.30 cm     LV e' medial:    9.46 cm/s LV PW:         1.00 cm     LV E/e' medial:  12.5 LV IVS:        0.90 cm     LV e' lateral:   7.51 cm/s LVOT diam:     1.80 cm     LV E/e' lateral: 15.7 LV SV:         83 LV SV Index:   44 LVOT Area:     2.54 cm  LV Volumes (MOD) LV vol d, MOD A2C: 79.4 ml LV vol d, MOD A4C: 71.0 ml LV vol s, MOD A2C: 29.5 ml LV vol s, MOD A4C: 22.6 ml LV SV MOD A2C:     49.9 ml LV SV MOD A4C:     71.0 ml LV SV MOD BP:      49.5 ml RIGHT VENTRICLE RV S prime:     14.80 cm/s TAPSE (M-mode): 2.8 cm LEFT ATRIUM             Index        RIGHT ATRIUM           Index LA diam:        3.70 cm 1.97 cm/m   RA Area:     15.20 cm LA Vol (A2C):   56.0 ml 29.76 ml/m  RA Volume:   39.20 ml  20.83 ml/m LA Vol (A4C):   46.2 ml 24.55 ml/m LA Biplane Vol: 53.5 ml 28.43 ml/m  AORTIC VALVE AV Area (Vmax):    1.93 cm AV Area (Vmean):   1.79 cm AV Area (VTI):     1.80 cm AV Vmax:           204.00 cm/s AV Vmean:          141.000 cm/s AV VTI:            0.463 m AV Peak Grad:      16.6 mmHg AV Mean Grad:      9.0 mmHg LVOT Vmax:         155.00 cm/s LVOT Vmean:        99.000 cm/s LVOT VTI:          0.327 m LVOT/AV VTI ratio: 0.71  AORTA Ao Root diam: 2.80 cm MITRAL VALVE MV Area (PHT): 3.91 cm     SHUNTS MV Decel Time: 194 msec     Systemic VTI:  0.33 m MR Peak grad: 136.4 mmHg    Systemic Diam: 1.80 cm MR Mean grad: 89.0 mmHg MR Vmax:      584.00 cm/s MR Vmean:     435.0 cm/s MV E velocity: 118.00 cm/s MV A velocity: 114.00 cm/s MV E/A ratio:  1.04 Carlyle Dolly MD Electronically signed by Carlyle Dolly MD Signature Date/Time: 02/03/2023/2:49:17 PM    Final

## 2023-02-07 NOTE — Progress Notes (Signed)
Patient presents today for chemotherapy infusion. Patient is in satisfactory condition with no new complaints voiced.  Vital signs are stable.  Labs reviewed by Dr. Delton Coombes during the office visit and all labs are within treatment parameters.  We will proceed with treatment per MD orders.

## 2023-02-07 NOTE — Addendum Note (Signed)
Addended by: Tally Due on: 02/07/2023 11:03 AM   Modules accepted: Orders

## 2023-02-14 ENCOUNTER — Ambulatory Visit: Payer: PPO | Admitting: Hematology

## 2023-02-14 ENCOUNTER — Inpatient Hospital Stay: Payer: PPO | Attending: Hematology

## 2023-02-14 ENCOUNTER — Other Ambulatory Visit: Payer: Self-pay

## 2023-02-14 ENCOUNTER — Inpatient Hospital Stay: Payer: PPO

## 2023-02-14 VITALS — BP 133/62 | HR 78 | Temp 98.2°F | Resp 18

## 2023-02-14 DIAGNOSIS — N189 Chronic kidney disease, unspecified: Secondary | ICD-10-CM | POA: Insufficient documentation

## 2023-02-14 DIAGNOSIS — D6489 Other specified anemias: Secondary | ICD-10-CM | POA: Diagnosis not present

## 2023-02-14 DIAGNOSIS — C50412 Malignant neoplasm of upper-outer quadrant of left female breast: Secondary | ICD-10-CM

## 2023-02-14 DIAGNOSIS — Z170 Estrogen receptor positive status [ER+]: Secondary | ICD-10-CM | POA: Diagnosis not present

## 2023-02-14 DIAGNOSIS — Z95828 Presence of other vascular implants and grafts: Secondary | ICD-10-CM

## 2023-02-14 DIAGNOSIS — Z5112 Encounter for antineoplastic immunotherapy: Secondary | ICD-10-CM | POA: Diagnosis not present

## 2023-02-14 DIAGNOSIS — D508 Other iron deficiency anemias: Secondary | ICD-10-CM

## 2023-02-14 DIAGNOSIS — E611 Iron deficiency: Secondary | ICD-10-CM | POA: Diagnosis not present

## 2023-02-14 DIAGNOSIS — Z5111 Encounter for antineoplastic chemotherapy: Secondary | ICD-10-CM | POA: Insufficient documentation

## 2023-02-14 LAB — COMPREHENSIVE METABOLIC PANEL
ALT: 28 U/L (ref 0–44)
AST: 19 U/L (ref 15–41)
Albumin: 3.3 g/dL — ABNORMAL LOW (ref 3.5–5.0)
Alkaline Phosphatase: 52 U/L (ref 38–126)
Anion gap: 6 (ref 5–15)
BUN: 20 mg/dL (ref 8–23)
CO2: 26 mmol/L (ref 22–32)
Calcium: 9.2 mg/dL (ref 8.9–10.3)
Chloride: 105 mmol/L (ref 98–111)
Creatinine, Ser: 1.17 mg/dL — ABNORMAL HIGH (ref 0.44–1.00)
GFR, Estimated: 48 mL/min — ABNORMAL LOW (ref >60)
Glucose, Bld: 105 mg/dL — ABNORMAL HIGH (ref 70–99)
Potassium: 3.6 mmol/L (ref 3.5–5.1)
Sodium: 137 mmol/L (ref 135–145)
Total Bilirubin: 0.5 mg/dL (ref 0.3–1.2)
Total Protein: 6 g/dL — ABNORMAL LOW (ref 6.5–8.1)

## 2023-02-14 LAB — CBC WITH DIFFERENTIAL/PLATELET
Abs Immature Granulocytes: 0.02 10*3/uL (ref 0.00–0.07)
Basophils Absolute: 0.1 10*3/uL (ref 0.0–0.1)
Basophils Relative: 1 %
Eosinophils Absolute: 0.5 10*3/uL (ref 0.0–0.5)
Eosinophils Relative: 8 %
HCT: 33 % — ABNORMAL LOW (ref 36.0–46.0)
Hemoglobin: 10.5 g/dL — ABNORMAL LOW (ref 12.0–15.0)
Immature Granulocytes: 0 %
Lymphocytes Relative: 25 %
Lymphs Abs: 1.4 10*3/uL (ref 0.7–4.0)
MCH: 29.8 pg (ref 26.0–34.0)
MCHC: 31.8 g/dL (ref 30.0–36.0)
MCV: 93.8 fL (ref 80.0–100.0)
Monocytes Absolute: 0.5 10*3/uL (ref 0.1–1.0)
Monocytes Relative: 9 %
Neutro Abs: 3.2 10*3/uL (ref 1.7–7.7)
Neutrophils Relative %: 57 %
Platelets: 322 10*3/uL (ref 150–400)
RBC: 3.52 MIL/uL — ABNORMAL LOW (ref 3.87–5.11)
RDW: 14.8 % (ref 11.5–15.5)
WBC: 5.6 10*3/uL (ref 4.0–10.5)
nRBC: 0 % (ref 0.0–0.2)

## 2023-02-14 LAB — IRON AND TIBC
Iron: 36 ug/dL (ref 28–170)
Saturation Ratios: 10 % — ABNORMAL LOW (ref 10.4–31.8)
TIBC: 350 ug/dL (ref 250–450)
UIBC: 314 ug/dL

## 2023-02-14 LAB — FERRITIN: Ferritin: 67 ng/mL (ref 11–307)

## 2023-02-14 LAB — MAGNESIUM: Magnesium: 2 mg/dL (ref 1.7–2.4)

## 2023-02-14 MED ORDER — TRASTUZUMAB-ANNS CHEMO 420 MG IV SOLR
2.0000 mg/kg | Freq: Once | INTRAVENOUS | Status: AC
  Administered 2023-02-14: 168 mg via INTRAVENOUS
  Filled 2023-02-14: qty 8

## 2023-02-14 MED ORDER — SODIUM CHLORIDE 0.9 % IV SOLN
10.0000 mg | Freq: Once | INTRAVENOUS | Status: AC
  Administered 2023-02-14: 10 mg via INTRAVENOUS
  Filled 2023-02-14: qty 10

## 2023-02-14 MED ORDER — SODIUM CHLORIDE 0.9% FLUSH
10.0000 mL | Freq: Once | INTRAVENOUS | Status: AC
  Administered 2023-02-14: 10 mL via INTRAVENOUS

## 2023-02-14 MED ORDER — SODIUM CHLORIDE 0.9% FLUSH
10.0000 mL | INTRAVENOUS | Status: DC | PRN
  Administered 2023-02-14: 10 mL

## 2023-02-14 MED ORDER — FAMOTIDINE IN NACL 20-0.9 MG/50ML-% IV SOLN
20.0000 mg | Freq: Once | INTRAVENOUS | Status: AC
  Administered 2023-02-14: 20 mg via INTRAVENOUS
  Filled 2023-02-14: qty 50

## 2023-02-14 MED ORDER — SODIUM CHLORIDE 0.9 % IV SOLN: Freq: Once | INTRAVENOUS | Status: AC

## 2023-02-14 MED ORDER — CETIRIZINE HCL 10 MG/ML IV SOLN
10.0000 mg | Freq: Once | INTRAVENOUS | Status: AC
  Administered 2023-02-14: 10 mg via INTRAVENOUS
  Filled 2023-02-14: qty 1

## 2023-02-14 MED ORDER — ACETAMINOPHEN 325 MG PO TABS
650.0000 mg | ORAL_TABLET | Freq: Once | ORAL | Status: AC
  Administered 2023-02-14: 650 mg via ORAL
  Filled 2023-02-14: qty 2

## 2023-02-14 MED ORDER — HEPARIN SOD (PORK) LOCK FLUSH 100 UNIT/ML IV SOLN
500.0000 [IU] | Freq: Once | INTRAVENOUS | Status: AC | PRN
  Administered 2023-02-14: 500 [IU]

## 2023-02-14 MED ORDER — SODIUM CHLORIDE 0.9 % IV SOLN
64.0000 mg/m2 | Freq: Once | INTRAVENOUS | Status: AC
  Administered 2023-02-14: 126 mg via INTRAVENOUS
  Filled 2023-02-14: qty 21

## 2023-02-14 NOTE — Patient Instructions (Signed)
[facility]  Discharge Instructions: Thank you for choosing [facility] to provide your oncology and hematology care.  If you have a lab appointment with the [facility], please come in thru the Main Entrance and check in at the main information desk.  Wear comfortable clothing and clothing appropriate for easy access to any Portacath or PICC line.   We strive to give you quality time with your provider. You may need to reschedule your appointment if you arrive late (15 or more minutes).  Arriving late affects you and other patients whose appointments are after yours.  Also, if you miss three or more appointments without notifying the office, you may be dismissed from the clinic at the provider's discretion.      For prescription refill requests, have your pharmacy contact our office and allow 72 hours for refills to be completed.    Today you received the following chemotherapy and/or immunotherapy agents Kanjinti, Taxol.  Paclitaxel Injection What is this medication? PACLITAXEL (PAK li TAX el) treats some types of cancer. It works by slowing down the growth of cancer cells. This medicine may be used for other purposes; ask your health care provider or pharmacist if you have questions. COMMON BRAND NAME(S): Onxol, Taxol What should I tell my care team before I take this medication? They need to know if you have any of these conditions: Heart disease Liver disease Low white blood cell levels An unusual or allergic reaction to paclitaxel, other medications, foods, dyes, or preservatives If you or your partner are pregnant or trying to get pregnant Breast-feeding How should I use this medication? This medication is injected into a vein. It is given by your care team in a hospital or clinic setting. Talk to your care team about the use of this medication in children. While it may be given to children for selected conditions, precautions do  apply. Overdosage: If you think you have taken too much of this medicine contact a poison control center or emergency room at once. NOTE: This medicine is only for you. Do not share this medicine with others. What if I miss a dose? Keep appointments for follow-up doses. It is important not to miss your dose. Call your care team if you are unable to keep an appointment. What may interact with this medication? Do not take this medication with any of the following: Live virus vaccines Other medications may affect the way this medication works. Talk with your care team about all of the medications you take. They may suggest changes to your treatment plan to lower the risk of side effects and to make sure your medications work as intended. This list may not describe all possible interactions. Give your health care provider a list of all the medicines, herbs, non-prescription drugs, or dietary supplements you use. Also tell them if you smoke, drink alcohol, or use illegal drugs. Some items may interact with your medicine. What should I watch for while using this medication? Your condition will be monitored carefully while you are receiving this medication. You may need blood work while taking this medication. This medication may make you feel generally unwell. This is not uncommon as chemotherapy can affect healthy cells as well as cancer cells. Report any side effects. Continue your course of treatment even though you feel ill unless your care team tells you to stop. This medication can cause serious allergic reactions. To reduce the risk, your care team may give you other medications  to take before receiving this one. Be sure to follow the directions from your care team. This medication may increase your risk of getting an infection. Call your care team for advice if you get a fever, chills, sore throat, or other symptoms of a cold or flu. Do not treat yourself. Try to avoid being around people who are  sick. This medication may increase your risk to bruise or bleed. Call your care team if you notice any unusual bleeding. Be careful brushing or flossing your teeth or using a toothpick because you may get an infection or bleed more easily. If you have any dental work done, tell your dentist you are receiving this medication. Talk to your care team if you may be pregnant. Serious birth defects can occur if you take this medication during pregnancy. Talk to your care team before breastfeeding. Changes to your treatment plan may be needed. What side effects may I notice from receiving this medication? Side effects that you should report to your care team as soon as possible: Allergic reactions--skin rash, itching, hives, swelling of the face, lips, tongue, or throat Heart rhythm changes--fast or irregular heartbeat, dizziness, feeling faint or lightheaded, chest pain, trouble breathing Increase in blood pressure Infection--fever, chills, cough, sore throat, wounds that don't heal, pain or trouble when passing urine, general feeling of discomfort or being unwell Low blood pressure--dizziness, feeling faint or lightheaded, blurry vision Low red blood cell level--unusual weakness or fatigue, dizziness, headache, trouble breathing Painful swelling, warmth, or redness of the skin, blisters or sores at the infusion site Pain, tingling, or numbness in the hands or feet Slow heartbeat--dizziness, feeling faint or lightheaded, confusion, trouble breathing, unusual weakness or fatigue Unusual bruising or bleeding Side effects that usually do not require medical attention (report to your care team if they continue or are bothersome): Diarrhea Hair loss Joint pain Loss of appetite Muscle pain Nausea Vomiting This list may not describe all possible side effects. Call your doctor for medical advice about side effects. You may report side effects to FDA at 1-800-FDA-1088. Where should I keep my  medication? This medication is given in a hospital or clinic. It will not be stored at home. NOTE: This sheet is a summary. It may not cover all possible information. If you have questions about this medicine, talk to your doctor, pharmacist, or health care provider.  2023 Elsevier/Gold Standard (2022-03-17 00:00:00) Trastuzumab Injection What is this medication? TRASTUZUMAB (tras TOO zoo mab) treats breast cancer and stomach cancer. It works by blocking a protein that causes cancer cells to grow and multiply. This helps to slow or stop the spread of cancer cells. This medicine may be used for other purposes; ask your health care provider or pharmacist if you have questions. COMMON BRAND NAME(S): Herceptin, Janae Bridgeman, Ontruzant, Trazimera What should I tell my care team before I take this medication? They need to know if you have any of these conditions: Heart failure Lung disease An unusual or allergic reaction to trastuzumab, other medications, foods, dyes, or preservatives Pregnant or trying to get pregnant Breast-feeding How should I use this medication? This medication is injected into a vein. It is given by your care team in a hospital or clinic setting. Talk to your care team about the use of this medication in children. It is not approved for use in children. Overdosage: If you think you have taken too much of this medicine contact a poison control center or emergency room at once. NOTE: This  medicine is only for you. Do not share this medicine with others. What if I miss a dose? Keep appointments for follow-up doses. It is important not to miss your dose. Call your care team if you are unable to keep an appointment. What may interact with this medication? Certain types of chemotherapy, such as daunorubicin, doxorubicin, epirubicin, idarubicin This list may not describe all possible interactions. Give your health care provider a list of all the medicines, herbs,  non-prescription drugs, or dietary supplements you use. Also tell them if you smoke, drink alcohol, or use illegal drugs. Some items may interact with your medicine. What should I watch for while using this medication? Your condition will be monitored carefully while you are receiving this medication. This medication may make you feel generally unwell. This is not uncommon, as chemotherapy affects healthy cells as well as cancer cells. Report any side effects. Continue your course of treatment even though you feel ill unless your care team tells you to stop. This medication may increase your risk of getting an infection. Call your care team for advice if you get a fever, chills, sore throat, or other symptoms of a cold or flu. Do not treat yourself. Try to avoid being around people who are sick. Avoid taking medications that contain aspirin, acetaminophen, ibuprofen, naproxen, or ketoprofen unless instructed by your care team. These medications can hide a fever. Talk to your care team if you may be pregnant. Serious birth defects can occur if you take this medication during pregnancy and for 7 months after the last dose. You will need a negative pregnancy test before starting this medication. Contraception is recommended while taking this medication and for 7 months after the last dose. Your care team can help you find the option that works for you. Do not breastfeed while taking this medication and for 7 months after stopping treatment. What side effects may I notice from receiving this medication? Side effects that you should report to your care team as soon as possible: Allergic reactions or angioedema--skin rash, itching or hives, swelling of the face, eyes, lips, tongue, arms, or legs, trouble swallowing or breathing Dry cough, shortness of breath or trouble breathing Heart failure--shortness of breath, swelling of the ankles, feet, or hands, sudden weight gain, unusual weakness or  fatigue Infection--fever, chills, cough, or sore throat Infusion reactions--chest pain, shortness of breath or trouble breathing, feeling faint or lightheaded Side effects that usually do not require medical attention (report to your care team if they continue or are bothersome): Diarrhea Dizziness Headache Nausea Trouble sleeping Vomiting This list may not describe all possible side effects. Call your doctor for medical advice about side effects. You may report side effects to FDA at 1-800-FDA-1088. Where should I keep my medication? This medication is given in a hospital or clinic. It will not be stored at home. NOTE: This sheet is a summary. It may not cover all possible information. If you have questions about this medicine, talk to your doctor, pharmacist, or health care provider.  2023 Elsevier/Gold Standard (2022-03-03 00:00:00)       To help prevent nausea and vomiting after your treatment, we encourage you to take your nausea medication as directed.  BELOW ARE SYMPTOMS THAT SHOULD BE REPORTED IMMEDIATELY: *FEVER GREATER THAN 100.4 F (38 C) OR HIGHER *CHILLS OR SWEATING *NAUSEA AND VOMITING THAT IS NOT CONTROLLED WITH YOUR NAUSEA MEDICATION *UNUSUAL SHORTNESS OF BREATH *UNUSUAL BRUISING OR BLEEDING *URINARY PROBLEMS (pain or burning when urinating, or frequent urination) *  BOWEL PROBLEMS (unusual diarrhea, constipation, pain near the anus) TENDERNESS IN MOUTH AND THROAT WITH OR WITHOUT PRESENCE OF ULCERS (sore throat, sores in mouth, or a toothache) UNUSUAL RASH, SWELLING OR PAIN  UNUSUAL VAGINAL DISCHARGE OR ITCHING   Items with * indicate a potential emergency and should be followed up as soon as possible or go to the Emergency Department if any problems should occur.  Please show the CHEMOTHERAPY ALERT CARD or IMMUNOTHERAPY ALERT CARD at check-in to the Emergency Department and triage nurse.  Should you have questions after your visit or need to cancel or reschedule  your appointment, please contact [facility] [phone]  and follow the prompts.  Office hours are 8:00 a.m. to 4:30 p.m. Monday - Friday. Please note that voicemails left after 4:00 p.m. may not be returned until the following business day.  We are closed weekends and major holidays. You have access to a nurse at all times for urgent questions. Please call the main number to the clinic [phone] and follow the prompts.  For any non-urgent questions, you may also contact your provider using MyChart. We now offer e-Visits for anyone 22 and older to request care online for non-urgent symptoms. For details visit [email or web address].   Also download the MyChart app! Go to the app store, search "MyChart", open the app, select [facility], and log in with your MyChart username and password.

## 2023-02-14 NOTE — Progress Notes (Signed)
Patient presents today for chemotherapy infusion.  Patient is in satisfactory condition with no new complaints voiced.  Vital signs are stable.  Labs reviewed and all labs are within treatment parameters.  Patient was c/o blurred vision x3 weeks without headache or dizziness. MD aware, patient advised to follow-up with eye doctor. We will proceed with treatment per MD orders.     Patient tolerated treatment well with no complaints voiced.  Patient left ambulatory in stable condition.  Vital signs stable at discharge.  Follow up as scheduled.

## 2023-02-20 ENCOUNTER — Other Ambulatory Visit: Payer: Self-pay

## 2023-02-20 NOTE — Progress Notes (Signed)
[facility] [address], [place] [address]    Clinic Day:  02/21/2023  Referring physician: Benita Stabile, MD  Patient Care Team: Benita Stabile, MD as PCP - General (Internal Medicine) Patrica Duel, MD (Inactive) (Internal Medicine) Cyndia Bent, MD (Inactive) as Surgeon (General Surgery) Doreatha Massed, MD as Medical Oncologist (Medical Oncology) Therese Sarah, RN as Oncology Nurse Navigator (Medical Oncology)   ASSESSMENT & PLAN:   Assessment: 1.  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  In the 2:00 location 3 cm from the nipple, mass measures 0.8 x 0.9 x 0.9 cm.  Measured together, these lesions span 2.4 cm in the 2 o'clock position.  Left axilla is negative for adenopathy. - Biopsy (09/22/2022): 1. left breast 2:00, 1 cmfn-invasive ductal carcinoma, grade 2, ER 100%, PR 95%, Ki-67 20%, HER2 2+ by IHC, HER2 FISH positive with ratio of 2.45 2.  Left breast 2:00 3 cmfn-invasive ductal carcinoma, grade 2, ER/PR 100%, Ki-67 15%, HER2 2+ by IHC, FISH negative - Left mastectomy, SLNB by Dr. Dwain Sarna on 10/31/2022 - Pathology: 2.5 x 2.1 x 1.2 cm invasive moderately differentiated adenocarcinoma, grade 2, margins free, extensive lymphatic invasion.  0/1 lymph node positive.  Associated focal intermediate grade DCIS. - Adjuvant chemotherapy with weekly paclitaxel and Herceptin started on 12/13/2022   2.  Social/family history: - Lives at home by herself and is independent of ADLs and IADLs.  She worked as an Software engineer.  Quit smoking 40 years ago. - Brother had thyroid cancer.   3.  Stage I right breast cancer: - Diagnosed on 05/15/2006, right breast 11 o'clock position - Right mastectomy (08/15/2006): 1.3  cm IDC, ER 83% positive, PR 84% positive, HER2 negative, Ki-67 4% - Tamoxifen followed by exemestane followed by letrozole for 10 years.   4.  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  Osteopenia: - Last bone density on 02/03/2021: T-score -1.9. - We will plan to obtain DEXA scan prior to initiation of AI.   Plan: 1.  Stage Ia (T2 N0) HER2 positive left breast cancer: - She started taking Pepcid 20 mg twice daily.  Heartburn and hoarseness has improved. - Reviewed labs today: Normal LFTs.  Creatinine mildly elevated and stable at 1.35. - Ferritin is 62 and percent saturation 10.  Hemoglobin 10.2. - Recommend Feraheme weekly x 2. - Proceed with treatment today and next week.  She will complete Taxol next week.  I will see her back on 03/28/2023.   2.  High risk drug monitoring: - 2D echo on 02/03/2023: LVEF 60 to 65%.  Will repeat echo in 3 months.   3.  Peripheral neuropathy: - Baseline tingling in the lower legs and feet has been stable.  Orders Placed This Encounter  Procedures   Iron and TIBC ([facility] ONLY)    Standing Status:   Future    Number of Occurrences:   1    Standing Expiration Date:   02/20/2024   Ferritin    Standing Status:   Future    Number of Occurrences:   1    Standing Expiration Date:   02/20/2024   Magnesium    Standing Status:   Future  Standing Expiration Date:   05/29/2024   Magnesium    Standing Status:   Future    Standing Expiration Date:   06/19/2024   Magnesium    Standing Status:   Future    Standing Expiration Date:   07/10/2024   Magnesium    Standing Status:   Future    Standing Expiration Date:   07/31/2024      I,Katie Daubenspeck,acting as a scribe for Doreatha Massed, MD.,have documented all relevant documentation on the behalf of Doreatha Massed, MD,as directed by  Doreatha Massed, MD while in the presence of Doreatha Massed, MD.   I, Doreatha Massed MD, have  reviewed the above documentation for accuracy and completeness, and I agree with the above.   Doreatha Massed, MD   4/9/20245:46 PM  CHIEF COMPLAINT:   Diagnosis: stage Ib HER2 positive left breast cancer    Cancer Staging  Breast cancer of upper-outer quadrant of left female breast Staging form: Breast, AJCC 8th Edition - Clinical stage from 10/25/2022: Stage IB (cT2, cN0, cM0, G2, ER+, PR+, HER2+) - Unsigned    Prior Therapy: Left mastectomy and SLNB by Dr. Dwain Sarna on 10/31/2022   Current Therapy:  Trastuzumab and Herceptin    HISTORY OF PRESENT ILLNESS:   Oncology History  Breast cancer of upper-outer quadrant of left female breast  10/25/2022 Initial Diagnosis   Breast cancer of upper-outer quadrant of left female breast (HCC)   12/13/2022 -  Chemotherapy   Patient is on Treatment Plan : BREAST Paclitaxel + Trastuzumab q7d / Trastuzumab q21d        INTERVAL HISTORY:   Haley Peterson is a 77 y.o. female presenting to clinic today for follow up of stage Ib HER2 positive left breast cancer. She was last seen by me on 02/07/23.  Today, she states that she is doing well overall. Her appetite level is at 100%. Her energy level is at 100%.  PAST MEDICAL HISTORY:   Past Medical History: Past Medical History:  Diagnosis Date   Arthritis    Bone spur    left heel   Breast cancer    bilateral   Cancer    Bilateral Breast cancer   Cataract    right eye    CKD (chronic kidney disease), stage III    mgd by PCP    History of breast cancer    left, right   Hypertension    Personal history of radiation therapy    Pneumonia 10/2012   Port-A-Cath in place 12/06/2022    Surgical History: Past Surgical History:  Procedure Laterality Date   ABDOMINAL HYSTERECTOMY     BIOPSY  04/12/2017   Procedure: BIOPSY;  Surgeon: Malissa Hippo, MD;  [facility]: [facility];  Service: Endoscopy;;  ascending colon ulcer biopsies   BREAST BIOPSY Left 09/22/2022   [place] LT BREAST BX W LOC  DEV EA ADD LESION IMG BX SPEC US GUIDE 09/22/2022 [facility]   BREAST BIOPSY Left 09/22/2022   [place] LT BREAST BX W LOC DEV 1ST LESION IMG BX SPEC US GUIDE 09/22/2022 [facility]   BREAST LUMPECTOMY  08/28/2001   left   BREAST RECONSTRUCTION Right    implant   CHOLECYSTECTOMY  2002   COLONOSCOPY     COLONOSCOPY N/A 04/12/2017   Procedure: COLONOSCOPY;  Surgeon: Malissa Hippo, MD;  Location: [facility];  Service: Endoscopy;  Laterality: N/A;  830   MASTECTOMY  08/15/2006   right   PARTIAL HYSTERECTOMY  1985  PORTACATH PLACEMENT Right 10/31/2022   Procedure: INSERTION PORT-A-CATH;  Surgeon: Emelia Loron, MD;  Location: [facility];  Service: General;  Laterality: Right;   REPLACEMENT TOTAL KNEE  2009, 2010   right-2010, left 2009   SIMPLE MASTECTOMY WITH AXILLARY SENTINEL NODE BIOPSY Left 10/31/2022   Procedure: LEFT MASTECTOMY;  Surgeon: Emelia Loron, MD;  Location: [facility];  Service: General;  Laterality: Left;  90 MIN ROOM 2   TISSUE EXPANDER PLACEMENT     TOTAL HIP ARTHROPLASTY Right 10/09/2017   Procedure: RIGHT TOTAL HIP ARTHROPLASTY ANTERIOR APPROACH;  Surgeon: Gean Birchwood, MD;  Location: [facility];  Service: Orthopedics;  Laterality: Right;   TOTAL HIP ARTHROPLASTY Left 09/23/2019   Procedure: Left Anterior Hip Arthroplasty;  Surgeon: Gean Birchwood, MD;  Location: [facility];  Service: Orthopedics;  Laterality: Left;    Social History: Social History   Socioeconomic History   Marital status: Divorced    Spouse name: Not on file   Number of children: Not on file   Years of education: Not on file   Highest education level: Not on file  Occupational History   Not on file  Tobacco Use   Smoking status: Former    Packs/day: 0.50    Years: 5.00    Additional pack years: 0.00    Total pack years: 2.50    Types: Cigarettes    Quit date: 09/28/1983    Years since quitting: 39.4   Smokeless tobacco: Never  Vaping Use    Vaping Use: Never used  Substance and Sexual Activity   Alcohol use: No   Drug use: No   Sexual activity: Not on file  Other Topics Concern   Not on file  Social History Narrative   Not on file   Social Determinants of Health   Financial Resource Strain: Not on file  Food Insecurity: Not on file  Transportation Needs: Not on file  Physical Activity: Not on file  Stress: Not on file  Social Connections: Not on file  Intimate Partner Violence: Not on file    Family History: Family History  Problem Relation Age of Onset   Arthritis Mother    Thyroid cancer Brother        2010   Colon cancer Neg Hx    Breast cancer Neg Hx     Current Medications:  Current Outpatient Medications:    Ascorbic Acid (VITAMIN C) 1000 MG tablet, Take 1,000 mg by mouth daily with lunch., Disp: , Rfl:    Calcium-Vitamin D (CALTRATE 600 PLUS-VIT D PO), Take 1 tablet by mouth daily with lunch. , Disp: , Rfl:    Cholecalciferol (D-3-5) 125 MCG (5000 UT) capsule, Take 5,000 Units by mouth daily., Disp: , Rfl:    cholecalciferol (VITAMIN D) 1000 UNITS tablet, Take 1,000 Units by mouth daily with lunch. , Disp: , Rfl:    Garlic (GARLIQUE PO), Take 1 tablet by mouth daily with lunch., Disp: , Rfl:    lidocaine-prilocaine (EMLA) cream, Apply a quarter sized amount to port a cath site and cover with plastic wrap 1 hour prior to infusion appointments, Disp: 30 g, Rfl: 3   losartan (COZAAR) 100 MG tablet, Take 100 mg by mouth daily., Disp: , Rfl:    NON FORMULARY, Balance of Nature - 3 fruits and 3 Vegetables, Disp: , Rfl:    Omega-3 Fatty Acids (FISH OIL) 1200 MG CPDR, Take by mouth., Disp: , Rfl:    PACLITAXEL IV, Inject into the vein  once a week., Disp: , Rfl:    prochlorperazine (COMPAZINE) 10 MG tablet, Take 1 tablet (10 mg total) by mouth every 6 (six) hours as needed for nausea or vomiting., Disp: 60 tablet, Rfl: 3   TRASTUZUMAB IV, Inject into the vein once a week. Weekly x 12 weeks, then every 21 days,  Disp: , Rfl:    TURMERIC PO, Take 3 capsules by mouth daily with lunch., Disp: , Rfl:    vitamin B-12 (CYANOCOBALAMIN) 100 MCG tablet, Take 100 mcg by mouth daily., Disp: , Rfl:    hydrochlorothiazide (HYDRODIURIL) 12.5 MG tablet, Take 12.5 mg by mouth daily., Disp: , Rfl:  No current facility-administered medications for this visit.  Facility-Administered Medications Ordered in Other Visits:    sodium chloride flush (NS) 0.9 % injection 10 mL, 10 mL, Intracatheter, PRN, Doreatha MassedKatragadda, Cherice Glennie, MD, 10 mL at 02/07/23 1359   sodium chloride flush (NS) 0.9 % injection 10 mL, 10 mL, Intracatheter, PRN, Doreatha MassedKatragadda, Santi Troung, MD, 10 mL at 02/21/23 1318   Allergies: Allergies  Allergen Reactions   Penicillins Other (See Comments)    "LOSS OF VISION"  Has patient had a PCN reaction causing immediate rash, facial/tongue/throat swelling, SOB or lightheadedness with hypotension: No Has patient had a PCN reaction causing severe rash involving mucus membranes or skin necrosis: No Has patient had a PCN reaction that required hospitalization: No Has patient had a PCN reaction occurring within the last 10 years: No If all of the above answers are "NO", then may proceed with Cephalosporin use.    Oxytetracycline Other (See Comments)    Fever blisters   Tamoxifen Other (See Comments)    "GENERAL ILLNESS WITH LIGHTHEADNESS"    Aromasin [Exemestane] Other (See Comments)    UNSPECIFIED REACTION  Pt is unsure of what reaction was...   Codeine Other (See Comments)    LIGHTHEADED    REVIEW OF SYSTEMS:   Review of Systems  Constitutional:  Negative for chills, fatigue and fever.  HENT:   Negative for lump/mass, mouth sores, nosebleeds, sore throat and trouble swallowing.   Eyes:  Negative for eye problems.  Respiratory:  Negative for cough and shortness of breath.   Cardiovascular:  Negative for chest pain, leg swelling and palpitations.  Gastrointestinal:  Positive for diarrhea. Negative for  abdominal pain, constipation, nausea and vomiting.  Genitourinary:  Negative for bladder incontinence, difficulty urinating, dysuria, frequency, hematuria and nocturia.   Musculoskeletal:  Negative for arthralgias, back pain, flank pain, myalgias and neck pain.  Skin:  Negative for itching and rash.  Neurological:  Positive for numbness. Negative for dizziness and headaches.  Hematological:  Does not bruise/bleed easily.  Psychiatric/Behavioral:  Positive for sleep disturbance. Negative for depression and suicidal ideas. The patient is not nervous/anxious.   All other systems reviewed and are negative.    VITALS:   There were no vitals taken for this visit.  Wt Readings from Last 3 Encounters:  02/21/23 183 lb (83 kg)  02/14/23 183 lb 6.4 oz (83.2 kg)  02/07/23 184 lb (83.5 kg)    There is no height or weight on file to calculate BMI.  Performance status (ECOG): 1 - Symptomatic but completely ambulatory  PHYSICAL EXAM:   Physical Exam Vitals and nursing note reviewed. Exam conducted with a chaperone present.  Constitutional:      Appearance: Normal appearance.  Cardiovascular:     Rate and Rhythm: Normal rate and regular rhythm.     Pulses: Normal pulses.     Heart  sounds: Normal heart sounds.  Pulmonary:     Effort: Pulmonary effort is normal.     Breath sounds: Normal breath sounds.  Abdominal:     Palpations: Abdomen is soft. There is no hepatomegaly, splenomegaly or mass.     Tenderness: There is no abdominal tenderness.  Musculoskeletal:     Right lower leg: No edema.     Left lower leg: No edema.  Lymphadenopathy:     Cervical: No cervical adenopathy.     Right cervical: No superficial, deep or posterior cervical adenopathy.    Left cervical: No superficial, deep or posterior cervical adenopathy.     Upper Body:     Right upper body: No supraclavicular or axillary adenopathy.     Left upper body: No supraclavicular or axillary adenopathy.  Neurological:      General: No focal deficit present.     Mental Status: She is alert and oriented to person, place, and time.  Psychiatric:        Mood and Affect: Mood normal.        Behavior: Behavior normal.     LABS:      Latest Ref Rng & Units 02/21/2023    9:21 AM 02/14/2023    9:05 AM 02/07/2023    9:20 AM  CBC  WBC 4.0 - 10.5 K/uL 5.7  5.6  6.2   Hemoglobin 12.0 - 15.0 g/dL 16.1  09.6  04.5   Hematocrit 36.0 - 46.0 % 32.1  33.0  33.5   Platelets 150 - 400 K/uL 300  322  359       Latest Ref Rng & Units 02/21/2023    9:21 AM 02/14/2023    9:05 AM 02/07/2023    9:20 AM  CMP  Glucose 70 - 99 mg/dL 409  811  914   BUN 8 - 23 mg/dL 27  20  24    Creatinine 0.44 - 1.00 mg/dL 7.82  9.56  2.13   Sodium 135 - 145 mmol/L 136  137  138   Potassium 3.5 - 5.1 mmol/L 3.8  3.6  4.0   Chloride 98 - 111 mmol/L 106  105  106   CO2 22 - 32 mmol/L 25  26  25    Calcium 8.9 - 10.3 mg/dL 8.6  9.2  8.9   Total Protein 6.5 - 8.1 g/dL 5.8  6.0  5.9   Total Bilirubin 0.3 - 1.2 mg/dL 0.5  0.5  0.4   Alkaline Phos 38 - 126 U/L 51  52  53   AST 15 - 41 U/L 16  19  17    ALT 0 - 44 U/L 21  28  21       No results found for: "CEA1", "CEA" / No results found for: "CEA1", "CEA" No results found for: "PSA1" No results found for: "CAN199" No results found for: "CAN125"  Lab Results  Component Value Date   TOTALPROTELP 6.4 10/26/2022   ALBUMINELP 3.6 10/26/2022   A1GS 0.2 10/26/2022   A2GS 0.6 10/26/2022   BETS 1.1 10/26/2022   GAMS 0.9 10/26/2022   MSPIKE Not Observed 10/26/2022   SPEI Comment 10/26/2022   Lab Results  Component Value Date   TIBC 334 02/21/2023   TIBC 350 02/14/2023   FERRITIN 62 02/21/2023   FERRITIN 67 02/14/2023   IRONPCTSAT 10 (L) 02/21/2023   IRONPCTSAT 10 (L) 02/14/2023   Lab Results  Component Value Date   LDH 140 06/27/2011     STUDIES:  ECHOCARDIOGRAM COMPLETE  Result Date: 02/03/2023    ECHOCARDIOGRAM REPORT   Patient Name:   Haley Peterson Date of Exam: 02/03/2023 Medical  Rec #:  [id number]   Height:       64.0 in Accession #:    [id number]  Weight:       182.5 lb Date of Birth:  Apr 13, 1946    BSA:          1.882 m Patient Age:    76 years    BP:           156/50 mmHg Patient Gender: F           HR:           86 bpm. [facility] Procedure: 2D Echo, Cardiac Doppler and Color Doppler Indications:    Chemo Z09  History:        Patient has prior history of Echocardiogram examinations, most                 recent 11/21/2022. Risk Factors:Hypertension. Breast cancer of                 upper-outer quadrant of left female breast, S/P left mastectomy.  Sonographer:    Celesta Gentile RCS Referring Phys: [phone] Doreatha Massed IMPRESSIONS  1. Left ventricular ejection fraction, by estimation, is 60 to 65%. The left ventricle has normal function. The left ventricle has no regional wall motion abnormalities. Left ventricular diastolic parameters were normal.  2. Right ventricular systolic function is normal. The right ventricular size is normal.  3. The mitral valve is abnormal. Mild mitral valve regurgitation. No evidence of mitral stenosis.  4. The aortic valve is tricuspid. There is mild calcification of the aortic valve. There is mild thickening of the aortic valve. Aortic valve regurgitation is not visualized. No aortic stenosis is present.  5. The inferior vena cava is normal in size with greater than 50% respiratory variability, suggesting right atrial pressure of 3 mmHg. FINDINGS  Left Ventricle: Left ventricular ejection fraction, by estimation, is 60 to 65%. The left ventricle has normal function. The left ventricle has no regional wall motion abnormalities. The left ventricular internal cavity size was normal in size. There is  no left ventricular hypertrophy. Left ventricular diastolic parameters were normal. Right Ventricle: The right ventricular size is normal. Right vetricular wall thickness was not well visualized. Right ventricular systolic function is normal. Left  Atrium: Left atrial size was normal in size. Right Atrium: Right atrial size was normal in size. Pericardium: There is no evidence of pericardial effusion. Mitral Valve: The mitral valve is abnormal. Mild mitral valve regurgitation. No evidence of mitral valve stenosis. Tricuspid Valve: The tricuspid valve is normal in structure. Tricuspid valve regurgitation is trivial. No evidence of tricuspid stenosis. Aortic Valve: The aortic valve is tricuspid. There is mild calcification of the aortic valve. There is mild thickening of the aortic valve. There is mild aortic valve annular calcification. Aortic valve regurgitation is not visualized. No aortic stenosis  is present. Aortic valve mean gradient measures 9.0 mmHg. Aortic valve peak gradient measures 16.6 mmHg. Aortic valve area, by VTI measures 1.80 cm. Pulmonic Valve: The pulmonic valve was not well visualized. Pulmonic valve regurgitation is not visualized. No evidence of pulmonic stenosis. Aorta: The aortic root is normal in size and structure. Venous: The inferior vena cava is normal in size with greater than 50% respiratory variability, suggesting right atrial pressure of 3 mmHg. IAS/Shunts: No atrial  level shunt detected by color flow Doppler.  LEFT VENTRICLE PLAX 2D LVIDd:         4.40 cm     Diastology LVIDs:         2.30 cm     LV e' medial:    9.46 cm/s LV PW:         1.00 cm     LV E/e' medial:  12.5 LV IVS:        0.90 cm     LV e' lateral:   7.51 cm/s LVOT diam:     1.80 cm     LV E/e' lateral: 15.7 LV SV:         83 LV SV Index:   44 LVOT Area:     2.54 cm  LV Volumes (MOD) LV vol d, MOD A2C: 79.4 ml LV vol d, MOD A4C: 71.0 ml LV vol s, MOD A2C: 29.5 ml LV vol s, MOD A4C: 22.6 ml LV SV MOD A2C:     49.9 ml LV SV MOD A4C:     71.0 ml LV SV MOD BP:      49.5 ml RIGHT VENTRICLE RV S prime:     14.80 cm/s TAPSE (M-mode): 2.8 cm LEFT ATRIUM             Index        RIGHT ATRIUM           Index LA diam:        3.70 cm 1.97 cm/m   RA Area:     15.20 cm LA  Vol (A2C):   56.0 ml 29.76 ml/m  RA Volume:   39.20 ml  20.83 ml/m LA Vol (A4C):   46.2 ml 24.55 ml/m LA Biplane Vol: 53.5 ml 28.43 ml/m  AORTIC VALVE AV Area (Vmax):    1.93 cm AV Area (Vmean):   1.79 cm AV Area (VTI):     1.80 cm AV Vmax:           204.00 cm/s AV Vmean:          141.000 cm/s AV VTI:            0.463 m AV Peak Grad:      16.6 mmHg AV Mean Grad:      9.0 mmHg LVOT Vmax:         155.00 cm/s LVOT Vmean:        99.000 cm/s LVOT VTI:          0.327 m LVOT/AV VTI ratio: 0.71  AORTA Ao Root diam: 2.80 cm MITRAL VALVE MV Area (PHT): 3.91 cm     SHUNTS MV Decel Time: 194 msec     Systemic VTI:  0.33 m MR Peak grad: 136.4 mmHg    Systemic Diam: 1.80 cm MR Mean grad: 89.0 mmHg MR Vmax:      584.00 cm/s MR Vmean:     435.0 cm/s MV E velocity: 118.00 cm/s MV A velocity: 114.00 cm/s MV E/A ratio:  1.04 Dina Rich MD Electronically signed by Dina Rich MD Signature Date/Time: 02/03/2023/2:49:17 PM    Final

## 2023-02-21 ENCOUNTER — Ambulatory Visit: Payer: PPO

## 2023-02-21 ENCOUNTER — Other Ambulatory Visit: Payer: PPO

## 2023-02-21 ENCOUNTER — Ambulatory Visit: Payer: PPO | Admitting: Hematology

## 2023-02-21 ENCOUNTER — Encounter: Payer: Self-pay | Admitting: Hematology

## 2023-02-21 ENCOUNTER — Inpatient Hospital Stay: Payer: PPO

## 2023-02-21 ENCOUNTER — Inpatient Hospital Stay (HOSPITAL_BASED_OUTPATIENT_CLINIC_OR_DEPARTMENT_OTHER): Payer: PPO | Admitting: Hematology

## 2023-02-21 VITALS — BP 140/57 | HR 75 | Temp 97.7°F | Resp 18

## 2023-02-21 DIAGNOSIS — D508 Other iron deficiency anemias: Secondary | ICD-10-CM

## 2023-02-21 DIAGNOSIS — Z95828 Presence of other vascular implants and grafts: Secondary | ICD-10-CM | POA: Diagnosis not present

## 2023-02-21 DIAGNOSIS — Z5112 Encounter for antineoplastic immunotherapy: Secondary | ICD-10-CM | POA: Diagnosis not present

## 2023-02-21 DIAGNOSIS — Z170 Estrogen receptor positive status [ER+]: Secondary | ICD-10-CM

## 2023-02-21 DIAGNOSIS — D509 Iron deficiency anemia, unspecified: Secondary | ICD-10-CM | POA: Insufficient documentation

## 2023-02-21 DIAGNOSIS — C50412 Malignant neoplasm of upper-outer quadrant of left female breast: Secondary | ICD-10-CM | POA: Diagnosis not present

## 2023-02-21 LAB — COMPREHENSIVE METABOLIC PANEL
ALT: 21 U/L (ref 0–44)
AST: 16 U/L (ref 15–41)
Albumin: 3.3 g/dL — ABNORMAL LOW (ref 3.5–5.0)
Alkaline Phosphatase: 51 U/L (ref 38–126)
Anion gap: 5 (ref 5–15)
BUN: 27 mg/dL — ABNORMAL HIGH (ref 8–23)
CO2: 25 mmol/L (ref 22–32)
Calcium: 8.6 mg/dL — ABNORMAL LOW (ref 8.9–10.3)
Chloride: 106 mmol/L (ref 98–111)
Creatinine, Ser: 1.35 mg/dL — ABNORMAL HIGH (ref 0.44–1.00)
GFR, Estimated: 41 mL/min — ABNORMAL LOW (ref >60)
Glucose, Bld: 107 mg/dL — ABNORMAL HIGH (ref 70–99)
Potassium: 3.8 mmol/L (ref 3.5–5.1)
Sodium: 136 mmol/L (ref 135–145)
Total Bilirubin: 0.5 mg/dL (ref 0.3–1.2)
Total Protein: 5.8 g/dL — ABNORMAL LOW (ref 6.5–8.1)

## 2023-02-21 LAB — CBC WITH DIFFERENTIAL/PLATELET
Abs Immature Granulocytes: 0.02 10*3/uL (ref 0.00–0.07)
Basophils Absolute: 0 10*3/uL (ref 0.0–0.1)
Basophils Relative: 1 %
Eosinophils Absolute: 0.3 10*3/uL (ref 0.0–0.5)
Eosinophils Relative: 5 %
HCT: 32.1 % — ABNORMAL LOW (ref 36.0–46.0)
Hemoglobin: 10.2 g/dL — ABNORMAL LOW (ref 12.0–15.0)
Immature Granulocytes: 0 %
Lymphocytes Relative: 23 %
Lymphs Abs: 1.3 10*3/uL (ref 0.7–4.0)
MCH: 29.6 pg (ref 26.0–34.0)
MCHC: 31.8 g/dL (ref 30.0–36.0)
MCV: 93 fL (ref 80.0–100.0)
Monocytes Absolute: 0.5 10*3/uL (ref 0.1–1.0)
Monocytes Relative: 9 %
Neutro Abs: 3.6 10*3/uL (ref 1.7–7.7)
Neutrophils Relative %: 62 %
Platelets: 300 10*3/uL (ref 150–400)
RBC: 3.45 MIL/uL — ABNORMAL LOW (ref 3.87–5.11)
RDW: 15.2 % (ref 11.5–15.5)
WBC: 5.7 10*3/uL (ref 4.0–10.5)
nRBC: 0 % (ref 0.0–0.2)

## 2023-02-21 LAB — MAGNESIUM: Magnesium: 2 mg/dL (ref 1.7–2.4)

## 2023-02-21 LAB — IRON AND TIBC
Iron: 33 ug/dL (ref 28–170)
Saturation Ratios: 10 % — ABNORMAL LOW (ref 10.4–31.8)
TIBC: 334 ug/dL (ref 250–450)
UIBC: 301 ug/dL

## 2023-02-21 LAB — FERRITIN: Ferritin: 62 ng/mL (ref 11–307)

## 2023-02-21 MED ORDER — ACETAMINOPHEN 325 MG PO TABS
650.0000 mg | ORAL_TABLET | Freq: Once | ORAL | Status: AC
  Administered 2023-02-21: 650 mg via ORAL
  Filled 2023-02-21: qty 2

## 2023-02-21 MED ORDER — SODIUM CHLORIDE 0.9 % IV SOLN
10.0000 mg | Freq: Once | INTRAVENOUS | Status: AC
  Administered 2023-02-21: 10 mg via INTRAVENOUS
  Filled 2023-02-21: qty 10

## 2023-02-21 MED ORDER — SODIUM CHLORIDE 0.9 % IV SOLN
64.0000 mg/m2 | Freq: Once | INTRAVENOUS | Status: AC
  Administered 2023-02-21: 126 mg via INTRAVENOUS
  Filled 2023-02-21: qty 21

## 2023-02-21 MED ORDER — TRASTUZUMAB-ANNS CHEMO 420 MG IV SOLR
2.0000 mg/kg | Freq: Once | INTRAVENOUS | Status: AC
  Administered 2023-02-21: 168 mg via INTRAVENOUS
  Filled 2023-02-21: qty 8

## 2023-02-21 MED ORDER — FAMOTIDINE IN NACL 20-0.9 MG/50ML-% IV SOLN
20.0000 mg | Freq: Once | INTRAVENOUS | Status: AC
  Administered 2023-02-21: 20 mg via INTRAVENOUS
  Filled 2023-02-21: qty 50

## 2023-02-21 MED ORDER — HEPARIN SOD (PORK) LOCK FLUSH 100 UNIT/ML IV SOLN
500.0000 [IU] | Freq: Once | INTRAVENOUS | Status: AC | PRN
  Administered 2023-02-21: 500 [IU]

## 2023-02-21 MED ORDER — SODIUM CHLORIDE 0.9 % IV SOLN: Freq: Once | INTRAVENOUS | Status: AC

## 2023-02-21 MED ORDER — SODIUM CHLORIDE 0.9% FLUSH
10.0000 mL | INTRAVENOUS | Status: DC | PRN
  Administered 2023-02-21: 10 mL

## 2023-02-21 MED ORDER — CETIRIZINE HCL 10 MG/ML IV SOLN
10.0000 mg | Freq: Once | INTRAVENOUS | Status: AC
  Administered 2023-02-21: 10 mg via INTRAVENOUS
  Filled 2023-02-21: qty 1

## 2023-02-21 MED ORDER — SODIUM CHLORIDE 0.9% FLUSH
10.0000 mL | INTRAVENOUS | Status: DC | PRN
  Administered 2023-02-21: 10 mL via INTRAVENOUS

## 2023-02-21 NOTE — Progress Notes (Signed)
Patients port flushed without difficulty.  Good blood return noted with no bruising or swelling noted at site.  Patient remains accessed for treatment.  

## 2023-02-21 NOTE — Progress Notes (Signed)
Patient presents today for Kanjinti and Taxol infusion. Patient is in satisfactory condition with no new complaints voiced.  Vital signs are stable.  Labs reviewed by Dr. Ellin Saba during the office visit and all labs are within treatment parameters.  We will proceed with treatment per MD orders.   Treatment given today per MD orders. Tolerated infusion without adverse affects. Vital signs stable. No complaints at this time. Discharged from clinic ambulatory in stable condition. Alert and oriented x 3. F/U with [facility] as scheduled.

## 2023-02-21 NOTE — Progress Notes (Signed)
Patient has been examined by Dr. Katragadda. Vital signs and labs have been reviewed by MD - ANC, Creatinine, LFTs, hemoglobin, and platelets are within treatment parameters per M.D. - pt may proceed with treatment.  Primary RN and pharmacy notified.  

## 2023-02-21 NOTE — Patient Instructions (Signed)
[facility] at [facility] Discharge Instructions   You were seen and examined today by Dr. Ellin Saba.  He reviewed the results of your lab work which are normal/stable.   We will proceed with your treatment today.   We will arrange for you to have 2 iron infusions with your weekly treatments. These will be given 1 week apart.   Return as scheduled.    Thank you for choosing [facility] at [facility] to provide your oncology and hematology care.  To afford each patient quality time with our provider, please arrive at least 15 minutes before your scheduled appointment time.   If you have a lab appointment with the Cancer Center please come in thru the Main Entrance and check in at the main information desk.  You need to re-schedule your appointment should you arrive 10 or more minutes late.  We strive to give you quality time with our providers, and arriving late affects you and other patients whose appointments are after yours.  Also, if you no show three or more times for appointments you may be dismissed from the clinic at the providers discretion.     Again, thank you for choosing [facility]  Our hope is that these requests will decrease the amount of time that you wait before being seen by our physicians.       _____________________________________________________________  Should you have questions after your visit to [facility], please contact our office at [phone] and follow the prompts.  Our office hours are 8:00 a.m. and 4:30 p.m. Monday - Friday.  Please note that voicemails left after 4:00 p.m. may not be returned until the following business day.  We are closed weekends and major holidays.  You do have access to a nurse 24-7, just call the main number to the clinic [phone] and do not press any options, hold on the line and a nurse will answer the phone.    For prescription refill requests, have  your pharmacy contact our office and allow 72 hours.    Due to Covid, you will need to wear a mask upon entering the hospital. If you do not have a mask, a mask will be given to you at the Main Entrance upon arrival. For doctor visits, patients may have 1 support person age 43 or older with them. For treatment visits, patients can not have anyone with them due to social distancing guidelines and our immunocompromised population.

## 2023-02-21 NOTE — Patient Instructions (Addendum)
[facility]  Discharge Instructions: Thank you for choosing [facility] to provide your oncology and hematology care.  If you have a lab appointment with the Cancer Center - please note that after April 8th, 2024, all labs will be drawn in the cancer center.  You do not have to check in or register with the main entrance as you have in the past but will complete your check-in in the cancer center.  Wear comfortable clothing and clothing appropriate for easy access to any Portacath or PICC line.   We strive to give you quality time with your provider. You may need to reschedule your appointment if you arrive late (15 or more minutes).  Arriving late affects you and other patients whose appointments are after yours.  Also, if you miss three or more appointments without notifying the office, you may be dismissed from the clinic at the provider's discretion.      For prescription refill requests, have your pharmacy contact our office and allow 72 hours for refills to be completed.    Today you received the following chemotherapy and/or immunotherapy agents Kanjinti and Taxol   To help prevent nausea and vomiting after your treatment, we encourage you to take your nausea medication as directed.  Trastuzumab Injection What is this medication? TRASTUZUMAB (tras TOO zoo mab) treats breast cancer and stomach cancer. It works by blocking a protein that causes cancer cells to grow and multiply. This helps to slow or stop the spread of cancer cells. This medicine may be used for other purposes; ask your health care provider or pharmacist if you have questions. COMMON BRAND NAME(S): Herceptin, Marlowe Alt, Ontruzant, Trazimera What should I tell my care team before I take this medication? They need to know if you have any of these conditions: Heart failure Lung disease An unusual or allergic reaction to trastuzumab, other medications, foods, dyes, or  preservatives Pregnant or trying to get pregnant Breast-feeding How should I use this medication? This medication is injected into a vein. It is given by your care team in a hospital or clinic setting. Talk to your care team about the use of this medication in children. It is not approved for use in children. Overdosage: If you think you have taken too much of this medicine contact a poison control center or emergency room at once. NOTE: This medicine is only for you. Do not share this medicine with others. What if I miss a dose? Keep appointments for follow-up doses. It is important not to miss your dose. Call your care team if you are unable to keep an appointment. What may interact with this medication? Certain types of chemotherapy, such as daunorubicin, doxorubicin, epirubicin, idarubicin This list may not describe all possible interactions. Give your health care provider a list of all the medicines, herbs, non-prescription drugs, or dietary supplements you use. Also tell them if you smoke, drink alcohol, or use illegal drugs. Some items may interact with your medicine. What should I watch for while using this medication? Your condition will be monitored carefully while you are receiving this medication. This medication may make you feel generally unwell. This is not uncommon, as chemotherapy affects healthy cells as well as cancer cells. Report any side effects. Continue your course of treatment even though you feel ill unless your care team tells you to stop. This medication may increase your risk of getting an infection. Call your care team for advice if you get a  fever, chills, sore throat, or other symptoms of a cold or flu. Do not treat yourself. Try to avoid being around people who are sick. Avoid taking medications that contain aspirin, acetaminophen, ibuprofen, naproxen, or ketoprofen unless instructed by your care team. These medications can hide a fever. Talk to your care team if  you may be pregnant. Serious birth defects can occur if you take this medication during pregnancy and for 7 months after the last dose. You will need a negative pregnancy test before starting this medication. Contraception is recommended while taking this medication and for 7 months after the last dose. Your care team can help you find the option that works for you. Do not breastfeed while taking this medication and for 7 months after stopping treatment. What side effects may I notice from receiving this medication? Side effects that you should report to your care team as soon as possible: Allergic reactions or angioedema--skin rash, itching or hives, swelling of the face, eyes, lips, tongue, arms, or legs, trouble swallowing or breathing Dry cough, shortness of breath or trouble breathing Heart failure--shortness of breath, swelling of the ankles, feet, or hands, sudden weight gain, unusual weakness or fatigue Infection--fever, chills, cough, or sore throat Infusion reactions--chest pain, shortness of breath or trouble breathing, feeling faint or lightheaded Side effects that usually do not require medical attention (report to your care team if they continue or are bothersome): Diarrhea Dizziness Headache Nausea Trouble sleeping Vomiting This list may not describe all possible side effects. Call your doctor for medical advice about side effects. You may report side effects to FDA at 1-800-FDA-1088. Where should I keep my medication? This medication is given in a hospital or clinic. It will not be stored at home. NOTE: This sheet is a summary. It may not cover all possible information. If you have questions about this medicine, talk to your doctor, pharmacist, or health care provider.  2023 Elsevier/Gold Standard (2022-03-03 00:00:00)   Paclitaxel Injection What is this medication? PACLITAXEL (PAK li TAX el) treats some types of cancer. It works by slowing down the growth of cancer  cells. This medicine may be used for other purposes; ask your health care provider or pharmacist if you have questions. COMMON BRAND NAME(S): Onxol, Taxol What should I tell my care team before I take this medication? They need to know if you have any of these conditions: Heart disease Liver disease Low white blood cell levels An unusual or allergic reaction to paclitaxel, other medications, foods, dyes, or preservatives If you or your partner are pregnant or trying to get pregnant Breast-feeding How should I use this medication? This medication is injected into a vein. It is given by your care team in a hospital or clinic setting. Talk to your care team about the use of this medication in children. While it may be given to children for selected conditions, precautions do apply. Overdosage: If you think you have taken too much of this medicine contact a poison control center or emergency room at once. NOTE: This medicine is only for you. Do not share this medicine with others. What if I miss a dose? Keep appointments for follow-up doses. It is important not to miss your dose. Call your care team if you are unable to keep an appointment. What may interact with this medication? Do not take this medication with any of the following: Live virus vaccines Other medications may affect the way this medication works. Talk with your care team about all of  the medications you take. They may suggest changes to your treatment plan to lower the risk of side effects and to make sure your medications work as intended. This list may not describe all possible interactions. Give your health care provider a list of all the medicines, herbs, non-prescription drugs, or dietary supplements you use. Also tell them if you smoke, drink alcohol, or use illegal drugs. Some items may interact with your medicine. What should I watch for while using this medication? Your condition will be monitored carefully while you are  receiving this medication. You may need blood work while taking this medication. This medication may make you feel generally unwell. This is not uncommon as chemotherapy can affect healthy cells as well as cancer cells. Report any side effects. Continue your course of treatment even though you feel ill unless your care team tells you to stop. This medication can cause serious allergic reactions. To reduce the risk, your care team may give you other medications to take before receiving this one. Be sure to follow the directions from your care team. This medication may increase your risk of getting an infection. Call your care team for advice if you get a fever, chills, sore throat, or other symptoms of a cold or flu. Do not treat yourself. Try to avoid being around people who are sick. This medication may increase your risk to bruise or bleed. Call your care team if you notice any unusual bleeding. Be careful brushing or flossing your teeth or using a toothpick because you may get an infection or bleed more easily. If you have any dental work done, tell your dentist you are receiving this medication. Talk to your care team if you may be pregnant. Serious birth defects can occur if you take this medication during pregnancy. Talk to your care team before breastfeeding. Changes to your treatment plan may be needed. What side effects may I notice from receiving this medication? Side effects that you should report to your care team as soon as possible: Allergic reactions--skin rash, itching, hives, swelling of the face, lips, tongue, or throat Heart rhythm changes--fast or irregular heartbeat, dizziness, feeling faint or lightheaded, chest pain, trouble breathing Increase in blood pressure Infection--fever, chills, cough, sore throat, wounds that don't heal, pain or trouble when passing urine, general feeling of discomfort or being unwell Low blood pressure--dizziness, feeling faint or lightheaded, blurry  vision Low red blood cell level--unusual weakness or fatigue, dizziness, headache, trouble breathing Painful swelling, warmth, or redness of the skin, blisters or sores at the infusion site Pain, tingling, or numbness in the hands or feet Slow heartbeat--dizziness, feeling faint or lightheaded, confusion, trouble breathing, unusual weakness or fatigue Unusual bruising or bleeding Side effects that usually do not require medical attention (report to your care team if they continue or are bothersome): Diarrhea Hair loss Joint pain Loss of appetite Muscle pain Nausea Vomiting This list may not describe all possible side effects. Call your doctor for medical advice about side effects. You may report side effects to FDA at 1-800-FDA-1088. Where should I keep my medication? This medication is given in a hospital or clinic. It will not be stored at home. NOTE: This sheet is a summary. It may not cover all possible information. If you have questions about this medicine, talk to your doctor, pharmacist, or health care provider.  2023 Elsevier/Gold Standard (2022-03-17 00:00:00)  BELOW ARE SYMPTOMS THAT SHOULD BE REPORTED IMMEDIATELY: *FEVER GREATER THAN 100.4 F (38 C) OR HIGHER *CHILLS OR  SWEATING *NAUSEA AND VOMITING THAT IS NOT CONTROLLED WITH YOUR NAUSEA MEDICATION *UNUSUAL SHORTNESS OF BREATH *UNUSUAL BRUISING OR BLEEDING *URINARY PROBLEMS (pain or burning when urinating, or frequent urination) *BOWEL PROBLEMS (unusual diarrhea, constipation, pain near the anus) TENDERNESS IN MOUTH AND THROAT WITH OR WITHOUT PRESENCE OF ULCERS (sore throat, sores in mouth, or a toothache) UNUSUAL RASH, SWELLING OR PAIN  UNUSUAL VAGINAL DISCHARGE OR ITCHING   Items with * indicate a potential emergency and should be followed up as soon as possible or go to the Emergency Department if any problems should occur.  Please show the CHEMOTHERAPY ALERT CARD or IMMUNOTHERAPY ALERT CARD at check-in to the  Emergency Department and triage nurse.  Should you have questions after your visit or need to cancel or reschedule your appointment, please contact [facility] [phone]  and follow the prompts.  Office hours are 8:00 a.m. to 4:30 p.m. Monday - Friday. Please note that voicemails left after 4:00 p.m. may not be returned until the following business day.  We are closed weekends and major holidays. You have access to a nurse at all times for urgent questions. Please call the main number to the clinic [phone] and follow the prompts.  For any non-urgent questions, you may also contact your provider using MyChart. We now offer e-Visits for anyone 25 and older to request care online for non-urgent symptoms. For details visit [email or web address].   Also download the MyChart app! Go to the app store, search "MyChart", open the app, select [place], and log in with your MyChart username and password.  [facility]  Discharge Instructions: Thank you for choosing [facility] to provide your oncology and hematology care.  If you have a lab appointment with the Cancer Center - please note that after April 8th, 2024, all labs will be drawn in the cancer center.  You do not have to check in or register with the main entrance as you have in the past but will complete your check-in in the cancer center.  Wear comfortable clothing and clothing appropriate for easy access to any Portacath or PICC line.   We strive to give you quality time with your provider. You may need to reschedule your appointment if you arrive late (15 or more minutes).  Arriving late affects you and other patients whose appointments are after yours.  Also, if you miss three or more appointments without notifying the office, you may be dismissed from the clinic at the provider's discretion.      For prescription refill requests, have your pharmacy contact our office and allow  72 hours for refills to be completed.

## 2023-02-23 ENCOUNTER — Telehealth: Payer: PPO | Admitting: Licensed Clinical Social Worker

## 2023-02-28 ENCOUNTER — Ambulatory Visit: Payer: PPO | Admitting: Hematology

## 2023-02-28 ENCOUNTER — Inpatient Hospital Stay: Payer: PPO

## 2023-02-28 VITALS — BP 130/62 | HR 87 | Temp 97.4°F | Resp 18

## 2023-02-28 VITALS — BP 125/88 | HR 104 | Temp 98.3°F | Resp 20 | Wt 181.3 lb

## 2023-02-28 DIAGNOSIS — Z170 Estrogen receptor positive status [ER+]: Secondary | ICD-10-CM

## 2023-02-28 DIAGNOSIS — Z95828 Presence of other vascular implants and grafts: Secondary | ICD-10-CM

## 2023-02-28 DIAGNOSIS — D508 Other iron deficiency anemias: Secondary | ICD-10-CM

## 2023-02-28 DIAGNOSIS — Z5112 Encounter for antineoplastic immunotherapy: Secondary | ICD-10-CM | POA: Diagnosis not present

## 2023-02-28 LAB — CBC WITH DIFFERENTIAL/PLATELET
Abs Immature Granulocytes: 0.03 10*3/uL (ref 0.00–0.07)
Basophils Absolute: 0.1 10*3/uL (ref 0.0–0.1)
Basophils Relative: 1 %
Eosinophils Absolute: 0.3 10*3/uL (ref 0.0–0.5)
Eosinophils Relative: 5 %
HCT: 32.5 % — ABNORMAL LOW (ref 36.0–46.0)
Hemoglobin: 10.5 g/dL — ABNORMAL LOW (ref 12.0–15.0)
Immature Granulocytes: 0 %
Lymphocytes Relative: 16 %
Lymphs Abs: 1.1 10*3/uL (ref 0.7–4.0)
MCH: 30.4 pg (ref 26.0–34.0)
MCHC: 32.3 g/dL (ref 30.0–36.0)
MCV: 94.2 fL (ref 80.0–100.0)
Monocytes Absolute: 0.5 10*3/uL (ref 0.1–1.0)
Monocytes Relative: 8 %
Neutro Abs: 5 10*3/uL (ref 1.7–7.7)
Neutrophils Relative %: 70 %
Platelets: 298 10*3/uL (ref 150–400)
RBC: 3.45 MIL/uL — ABNORMAL LOW (ref 3.87–5.11)
RDW: 15.6 % — ABNORMAL HIGH (ref 11.5–15.5)
WBC: 7.1 10*3/uL (ref 4.0–10.5)
nRBC: 0 % (ref 0.0–0.2)

## 2023-02-28 LAB — COMPREHENSIVE METABOLIC PANEL
ALT: 21 U/L (ref 0–44)
AST: 16 U/L (ref 15–41)
Albumin: 3.2 g/dL — ABNORMAL LOW (ref 3.5–5.0)
Alkaline Phosphatase: 52 U/L (ref 38–126)
Anion gap: 0 — ABNORMAL LOW (ref 5–15)
BUN: 19 mg/dL (ref 8–23)
CO2: 26 mmol/L (ref 22–32)
Calcium: 8.5 mg/dL — ABNORMAL LOW (ref 8.9–10.3)
Chloride: 108 mmol/L (ref 98–111)
Creatinine, Ser: 1.31 mg/dL — ABNORMAL HIGH (ref 0.44–1.00)
GFR, Estimated: 42 mL/min — ABNORMAL LOW (ref >60)
Glucose, Bld: 107 mg/dL — ABNORMAL HIGH (ref 70–99)
Potassium: 3.9 mmol/L (ref 3.5–5.1)
Sodium: 134 mmol/L — ABNORMAL LOW (ref 135–145)
Total Bilirubin: 0.3 mg/dL (ref 0.3–1.2)
Total Protein: 5.9 g/dL — ABNORMAL LOW (ref 6.5–8.1)

## 2023-02-28 LAB — MAGNESIUM: Magnesium: 2 mg/dL (ref 1.7–2.4)

## 2023-02-28 MED ORDER — SODIUM CHLORIDE 0.9 % IV SOLN
10.0000 mg | Freq: Once | INTRAVENOUS | Status: AC
  Administered 2023-02-28: 10 mg via INTRAVENOUS
  Filled 2023-02-28: qty 10

## 2023-02-28 MED ORDER — SODIUM CHLORIDE 0.9 % IV SOLN
64.0000 mg/m2 | Freq: Once | INTRAVENOUS | Status: AC
  Administered 2023-02-28: 126 mg via INTRAVENOUS
  Filled 2023-02-28: qty 21

## 2023-02-28 MED ORDER — SODIUM CHLORIDE 0.9% FLUSH
10.0000 mL | Freq: Once | INTRAVENOUS | Status: AC
  Administered 2023-02-28: 10 mL via INTRAVENOUS

## 2023-02-28 MED ORDER — ACETAMINOPHEN 325 MG PO TABS
650.0000 mg | ORAL_TABLET | Freq: Once | ORAL | Status: AC
  Administered 2023-02-28: 650 mg via ORAL
  Filled 2023-02-28: qty 2

## 2023-02-28 MED ORDER — SODIUM CHLORIDE 0.9 % IV SOLN: Freq: Once | INTRAVENOUS | Status: AC

## 2023-02-28 MED ORDER — SODIUM CHLORIDE 0.9 % IV SOLN
510.0000 mg | Freq: Once | INTRAVENOUS | Status: AC
  Administered 2023-02-28: 510 mg via INTRAVENOUS
  Filled 2023-02-28: qty 510

## 2023-02-28 MED ORDER — SODIUM CHLORIDE 0.9% FLUSH
10.0000 mL | INTRAVENOUS | Status: DC | PRN
  Administered 2023-02-28: 10 mL

## 2023-02-28 MED ORDER — TRASTUZUMAB-ANNS CHEMO 150 MG IV SOLR
2.0000 mg/kg | Freq: Once | INTRAVENOUS | Status: AC
  Administered 2023-02-28: 168 mg via INTRAVENOUS
  Filled 2023-02-28: qty 8

## 2023-02-28 MED ORDER — FAMOTIDINE IN NACL 20-0.9 MG/50ML-% IV SOLN
20.0000 mg | Freq: Once | INTRAVENOUS | Status: AC
  Administered 2023-02-28: 20 mg via INTRAVENOUS
  Filled 2023-02-28: qty 50

## 2023-02-28 MED ORDER — CETIRIZINE HCL 10 MG/ML IV SOLN
10.0000 mg | Freq: Once | INTRAVENOUS | Status: AC
  Administered 2023-02-28: 10 mg via INTRAVENOUS
  Filled 2023-02-28: qty 1

## 2023-02-28 MED ORDER — HEPARIN SOD (PORK) LOCK FLUSH 100 UNIT/ML IV SOLN
500.0000 [IU] | Freq: Once | INTRAVENOUS | Status: AC | PRN
  Administered 2023-02-28: 500 [IU]

## 2023-02-28 NOTE — Patient Instructions (Signed)
[facility]  Discharge Instructions: Thank you for choosing [facility] to provide your oncology and hematology care.  If you have a lab appointment with the Cancer Center - please note that after April 8th, 2024, all labs will be drawn in the cancer center.  You do not have to check in or register with the main entrance as you have in the past but will complete your check-in in the cancer center.  Wear comfortable clothing and clothing appropriate for easy access to any Portacath or PICC line.   We strive to give you quality time with your provider. You may need to reschedule your appointment if you arrive late (15 or more minutes).  Arriving late affects you and other patients whose appointments are after yours.  Also, if you miss three or more appointments without notifying the office, you may be dismissed from the clinic at the provider's discretion.      For prescription refill requests, have your pharmacy contact our office and allow 72 hours for refills to be completed.    Today you received the following chemotherapy and/or immunotherapy agents Kanjinti, Taxol, and Feraheme IV iron infusion.   To help prevent nausea and vomiting after your treatment, we encourage you to take your nausea medication as directed.  BELOW ARE SYMPTOMS THAT SHOULD BE REPORTED IMMEDIATELY: *FEVER GREATER THAN 100.4 F (38 C) OR HIGHER *CHILLS OR SWEATING *NAUSEA AND VOMITING THAT IS NOT CONTROLLED WITH YOUR NAUSEA MEDICATION *UNUSUAL SHORTNESS OF BREATH *UNUSUAL BRUISING OR BLEEDING *URINARY PROBLEMS (pain or burning when urinating, or frequent urination) *BOWEL PROBLEMS (unusual diarrhea, constipation, pain near the anus) TENDERNESS IN MOUTH AND THROAT WITH OR WITHOUT PRESENCE OF ULCERS (sore throat, sores in mouth, or a toothache) UNUSUAL RASH, SWELLING OR PAIN  UNUSUAL VAGINAL DISCHARGE OR ITCHING   Items with * indicate a potential emergency and should be followed up  as soon as possible or go to the Emergency Department if any problems should occur.  Please show the CHEMOTHERAPY ALERT CARD or IMMUNOTHERAPY ALERT CARD at check-in to the Emergency Department and triage nurse.  Should you have questions after your visit or need to cancel or reschedule your appointment, please contact [facility] [phone]  and follow the prompts.  Office hours are 8:00 a.m. to 4:30 p.m. Monday - Friday. Please note that voicemails left after 4:00 p.m. may not be returned until the following business day.  We are closed weekends and major holidays. You have access to a nurse at all times for urgent questions. Please call the main number to the clinic [phone] and follow the prompts.  For any non-urgent questions, you may also contact your provider using MyChart. We now offer e-Visits for anyone 62 and older to request care online for non-urgent symptoms. For details visit [email or web address].   Also download the MyChart app! Go to the app store, search "MyChart", open the app, select [place], and log in with your MyChart username and password.

## 2023-02-28 NOTE — Progress Notes (Signed)
Patient presents today for Kanjinti, Taxol, and Feraheme IV iron infusion.  Patient is in satisfactory condition with no new complaints voiced.  Vital signs are stable.  Labs reviewed and all labs are within treatment parameters.  We will proceed with treatment per MD orders.    Kanjinti, Taxol, and Feraheme given today per MD orders. Tolerated infusion without adverse affects. Vital signs stable. No complaints at this time. Discharged from clinic ambulatory in stable condition. Alert and oriented x 3. F/U with [facility] as scheduled.

## 2023-03-07 ENCOUNTER — Ambulatory Visit: Payer: PPO | Admitting: Hematology

## 2023-03-07 ENCOUNTER — Other Ambulatory Visit: Payer: PPO

## 2023-03-07 ENCOUNTER — Ambulatory Visit: Payer: PPO

## 2023-03-09 ENCOUNTER — Other Ambulatory Visit: Payer: Self-pay

## 2023-03-17 ENCOUNTER — Other Ambulatory Visit: Payer: Self-pay

## 2023-03-17 DIAGNOSIS — Z170 Estrogen receptor positive status [ER+]: Secondary | ICD-10-CM

## 2023-03-19 NOTE — Progress Notes (Signed)
[facility] [address] [place] [address]    Clinic Day:  03/20/2023  Referring physician: Benita Stabile, MD  Patient Care Team: Benita Stabile, MD as PCP - General (Internal Medicine) Patrica Duel, MD (Inactive) (Internal Medicine) Cyndia Bent, MD (Inactive) as Surgeon (General Surgery) Haley Massed, MD as Medical Oncologist (Medical Oncology) Therese Sarah, RN as Oncology Nurse Navigator (Medical Oncology)   ASSESSMENT & PLAN:   Assessment: 1.  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  In the 2:00 location 3 cm from the nipple, mass measures 0.8 x 0.9 x 0.9 cm.  Measured together, these lesions span 2.4 cm in the 2 o'clock position.  Left axilla is negative for adenopathy. - Biopsy (09/22/2022): 1. left breast 2:00, 1 cmfn-invasive ductal carcinoma, grade 2, ER 100%, PR 95%, Ki-67 20%, HER2 2+ by IHC, HER2 FISH positive with ratio of 2.45 2.  Left breast 2:00 3 cmfn-invasive ductal carcinoma, grade 2, ER/PR 100%, Ki-67 15%, HER2 2+ by IHC, FISH negative - Left mastectomy, SLNB by Dr. Dwain Sarna on 10/31/2022 - Pathology: 2.5 x 2.1 x 1.2 cm invasive moderately differentiated adenocarcinoma, grade 2, margins free, extensive lymphatic invasion.  0/1 lymph node positive.  Associated focal intermediate grade DCIS. - Adjuvant chemotherapy with weekly paclitaxel and Herceptin started on 12/13/2022, 12 cycles of Taxol completed on 02/28/2023.  With Herceptin every 3 weeks ongoing - Anastrozole started on 03/20/2023.   2.  Social/family history: - Lives at home by herself and is independent of ADLs and IADLs.  She worked as an Software engineer.  Quit smoking 40 years ago. - Brother had thyroid cancer.   3.  Stage I  right breast cancer: - Diagnosed on 05/15/2006, right breast 11 o'clock position - Right mastectomy (08/15/2006): 1.3 cm IDC, ER 83% positive, PR 84% positive, HER2 negative, Ki-67 4% - Tamoxifen followed by exemestane followed by letrozole for 10 years.   4.  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  Osteopenia: - Last bone density on 02/03/2021: T-score -1.9. - We will plan to obtain DEXA scan prior to initiation of AI.    Plan: 1.  Stage Ia (T2 N0) HER2 positive left breast cancer: - She reports improvement in her energy level since chemotherapy was completed. - We reviewed labs today: Normal LFTs and creatinine of 1.16.  CBC was grossly normal with mild improvement in hemoglobin. - We talked about continuing Herceptin for a total of 1 year. - We talked about starting anastrozole 1 mg tablet daily. - We will obtain bone density test prior to next visit. - As she had mastectomy, we did not make referral to radiation.   2.  High risk drug monitoring: - 2D echo on 02/03/2023 with LVEF 60 to 65%.  Will obtain echocardiogram prior to next visit in 6 weeks.   3.  Peripheral neuropathy: - Baseline tingling in the lower legs and feet has been stable.  No worsening with paclitaxel.  4.  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 - She will proceed with Feraheme second dose today.    Orders Placed This Encounter  Procedures   DG Bone Density    Standing Status:  Future    Standing Expiration Date:   03/19/2024    Order Specific Question:   Reason for Exam (SYMPTOM  OR DIAGNOSIS REQUIRED)    Answer:   drug therapy - initiation of anti-estrogen therapy    Order Specific Question:   Preferred imaging location?    Answer:   [facility]   Magnesium    Standing Status:   Future    Standing Expiration Date:   08/21/2024   Magnesium    Standing Status:   Future    Standing Expiration Date:   09/11/2024   Magnesium     Standing Status:   Future    Standing Expiration Date:   10/02/2024   Magnesium    Standing Status:   Future    Standing Expiration Date:   10/23/2024   Magnesium    Standing Status:   Future    Standing Expiration Date:   11/13/2024   ECHOCARDIOGRAM COMPLETE    Standing Status:   Future    Standing Expiration Date:   03/19/2024    Order Specific Question:   Where should this test be performed    Answer:   [facility]    Order Specific Question:   Perflutren DEFINITY (image enhancing agent) should be administered unless hypersensitivity or allergy exist    Answer:   Administer Perflutren    Order Specific Question:   Is a special reader required? (athlete or structural heart)    Answer:   No    Order Specific Question:   Does this study need to be read by the Structural team/Level 3 readers?    Answer:   No    Order Specific Question:   Reason for exam-Echo    Answer:   Chemo  Z09      I,Katie Daubenspeck,acting as a scribe for Haley Massed, MD.,have documented all relevant documentation on the behalf of Haley Massed, MD,as directed by  Haley Massed, MD while in the presence of Haley Massed, MD.   I, Haley Massed MD, have reviewed the above documentation for accuracy and completeness, and I agree with the above.   Haley Massed, MD   5/6/20246:15 PM  CHIEF COMPLAINT:   Diagnosis: stage Ib HER2 positive left breast cancer    Cancer Staging  Breast cancer of upper-outer quadrant of left female breast [facility]) Staging form: Breast, AJCC 8th Edition - Clinical stage from 10/25/2022: Stage IB (cT2, cN0, cM0, G2, ER+, PR+, HER2+) - Unsigned    Prior Therapy: 1. Left mastectomy and SLNB by Dr. Dwain Sarna on 10/31/2022  2. Trastuzumab and Herceptin 12/13/22 - 02/28/23  Current Therapy: Herceptin and anastrozole   HISTORY OF PRESENT ILLNESS:   Oncology History  Breast cancer of upper-outer quadrant of left female breast (HCC)  10/25/2022  Initial Diagnosis   Breast cancer of upper-outer quadrant of left female breast (HCC)   12/13/2022 -  Chemotherapy   Patient is on Treatment Plan : BREAST Paclitaxel + Trastuzumab q7d / Trastuzumab q21d        INTERVAL HISTORY:   Damaya is a 77 y.o. female presenting to clinic today for follow up of stage Ib HER2 positive left breast cancer. She was last seen by me on 02/21/23.  Today, she states that she is doing well overall. Her appetite level is at 100%. Her energy level is at 75%.  PAST MEDICAL HISTORY:   Past Medical History: Past Medical History:  Diagnosis Date   Arthritis    Bone spur  left heel   Breast cancer (HCC)    bilateral   Cancer (HCC)    Bilateral Breast cancer   Cataract    right eye    CKD (chronic kidney disease), stage III (HCC)    mgd by PCP    History of breast cancer    left, right   Hypertension    Personal history of radiation therapy    Pneumonia 10/2012   Port-A-Cath in place 12/06/2022    Surgical History: Past Surgical History:  Procedure Laterality Date   ABDOMINAL HYSTERECTOMY     BIOPSY  04/12/2017   Procedure: BIOPSY;  Surgeon: Malissa Hippo, MD;  Location: [facility];  Service: Endoscopy;;  ascending colon ulcer biopsies   BREAST BIOPSY Left 09/22/2022   [place] LT BREAST BX W LOC DEV EA ADD LESION IMG BX SPEC US GUIDE 09/22/2022 [facility]   BREAST BIOPSY Left 09/22/2022   [place] LT BREAST BX W LOC DEV 1ST LESION IMG BX SPEC US GUIDE 09/22/2022 [facility]   BREAST LUMPECTOMY  08/28/2001   left   BREAST RECONSTRUCTION Right    implant   CHOLECYSTECTOMY  2002   COLONOSCOPY     COLONOSCOPY N/A 04/12/2017   Procedure: COLONOSCOPY;  Surgeon: Malissa Hippo, MD;  Location: [facility];  Service: Endoscopy;  Laterality: N/A;  830   MASTECTOMY  08/15/2006   right   PARTIAL HYSTERECTOMY  1985   PORTACATH PLACEMENT Right 10/31/2022   Procedure: INSERTION PORT-A-CATH;  Surgeon: Emelia Loron, MD;  Location: [facility];  Service: General;  Laterality: Right;   REPLACEMENT TOTAL KNEE  2009, 2010   right-2010, left 2009   SIMPLE MASTECTOMY WITH AXILLARY SENTINEL NODE BIOPSY Left 10/31/2022   Procedure: LEFT MASTECTOMY;  Surgeon: Emelia Loron, MD;  Location: [facility];  Service: General;  Laterality: Left;  90 MIN ROOM 2   TISSUE EXPANDER PLACEMENT     TOTAL HIP ARTHROPLASTY Right 10/09/2017   Procedure: RIGHT TOTAL HIP ARTHROPLASTY ANTERIOR APPROACH;  Surgeon: Gean Birchwood, MD;  Location: [facility];  Service: Orthopedics;  Laterality: Right;   TOTAL HIP ARTHROPLASTY Left 09/23/2019   Procedure: Left Anterior Hip Arthroplasty;  Surgeon: Gean Birchwood, MD;  Location: [facility];  Service: Orthopedics;  Laterality: Left;    Social History: Social History   Socioeconomic History   Marital status: Divorced    Spouse name: Not on file   Number of children: Not on file   Years of education: Not on file   Highest education level: Not on file  Occupational History   Not on file  Tobacco Use   Smoking status: Former    Packs/day: 0.50    Years: 5.00    Additional pack years: 0.00    Total pack years: 2.50    Types: Cigarettes    Quit date: 09/28/1983    Years since quitting: 39.5   Smokeless tobacco: Never  Vaping Use   Vaping Use: Never used  Substance and Sexual Activity   Alcohol use: No   Drug use: No   Sexual activity: Not on file  Other Topics Concern   Not on file  Social History Narrative   Not on file   Social Determinants of Health   Financial Resource Strain: Not on file  Food Insecurity: Not on file  Transportation Needs: Not on file  Physical Activity: Not on file  Stress: Not on file  Social Connections: Not on file  Intimate Partner Violence:  Not on file    Family History: Family History  Problem Relation Age of Onset   Arthritis Mother    Thyroid cancer Brother        2010   Colon cancer Neg Hx    Breast cancer Neg Hx     Current  Medications:  Current Outpatient Medications:    anastrozole (ARIMIDEX) 1 MG tablet, Take 1 tablet (1 mg total) by mouth daily., Disp: 30 tablet, Rfl: 6   Ascorbic Acid (VITAMIN C) 1000 MG tablet, Take 1,000 mg by mouth daily with lunch., Disp: , Rfl:    Calcium-Vitamin D (CALTRATE 600 PLUS-VIT D PO), Take 1 tablet by mouth daily with lunch. , Disp: , Rfl:    Cholecalciferol (D-3-5) 125 MCG (5000 UT) capsule, Take 5,000 Units by mouth daily., Disp: , Rfl:    cholecalciferol (VITAMIN D) 1000 UNITS tablet, Take 1,000 Units by mouth daily with lunch. , Disp: , Rfl:    Garlic (GARLIQUE PO), Take 1 tablet by mouth daily with lunch., Disp: , Rfl:    lidocaine-prilocaine (EMLA) cream, Apply a quarter sized amount to port a cath site and cover with plastic wrap 1 hour prior to infusion appointments, Disp: 30 g, Rfl: 3   losartan (COZAAR) 100 MG tablet, Take 100 mg by mouth daily., Disp: , Rfl:    NON FORMULARY, Balance of Nature - 3 fruits and 3 Vegetables, Disp: , Rfl:    Omega-3 Fatty Acids (FISH OIL) 1200 MG CPDR, Take by mouth., Disp: , Rfl:    PACLITAXEL IV, Inject into the vein once a week., Disp: , Rfl:    prochlorperazine (COMPAZINE) 10 MG tablet, Take 1 tablet (10 mg total) by mouth every 6 (six) hours as needed for nausea or vomiting., Disp: 60 tablet, Rfl: 3   TRASTUZUMAB IV, Inject into the vein once a week. Weekly x 12 weeks, then every 21 days, Disp: , Rfl:    TURMERIC PO, Take 3 capsules by mouth daily with lunch., Disp: , Rfl:    vitamin B-12 (CYANOCOBALAMIN) 100 MCG tablet, Take 100 mcg by mouth daily., Disp: , Rfl:  No current facility-administered medications for this visit.  Facility-Administered Medications Ordered in Other Visits:    sodium chloride flush (NS) 0.9 % injection 10 mL, 10 mL, Intracatheter, PRN, Haley Massed, MD, 10 mL at 02/07/23 1359   sodium chloride flush (NS) 0.9 % injection 10 mL, 10 mL, Intracatheter, PRN, Haley Massed, MD, 10 mL at 03/20/23  1646   Allergies: Allergies  Allergen Reactions   Penicillins Other (See Comments)    "LOSS OF VISION"  Has patient had a PCN reaction causing immediate rash, facial/tongue/throat swelling, SOB or lightheadedness with hypotension: No Has patient had a PCN reaction causing severe rash involving mucus membranes or skin necrosis: No Has patient had a PCN reaction that required hospitalization: No Has patient had a PCN reaction occurring within the last 10 years: No If all of the above answers are "NO", then may proceed with Cephalosporin use.    Oxytetracycline Other (See Comments)    Fever blisters   Tamoxifen Other (See Comments)    "GENERAL ILLNESS WITH LIGHTHEADNESS"    Aromasin [Exemestane] Other (See Comments)    UNSPECIFIED REACTION  Pt is unsure of what reaction was...   Codeine Other (See Comments)    LIGHTHEADED    REVIEW OF SYSTEMS:   Review of Systems  Constitutional:  Negative for chills, fatigue and fever.  HENT:   Negative for lump/mass, mouth  sores, nosebleeds, sore throat and trouble swallowing.   Eyes:  Negative for eye problems.  Respiratory:  Negative for cough and shortness of breath.   Cardiovascular:  Negative for chest pain, leg swelling and palpitations.  Gastrointestinal:  Negative for abdominal pain, constipation, diarrhea, nausea and vomiting.  Genitourinary:  Negative for bladder incontinence, difficulty urinating, dysuria, frequency, hematuria and nocturia.   Musculoskeletal:  Negative for arthralgias, back pain, flank pain, myalgias and neck pain.  Skin:  Negative for itching and rash.  Neurological:  Positive for numbness. Negative for dizziness and headaches.  Hematological:  Does not bruise/bleed easily.  Psychiatric/Behavioral:  Negative for depression, sleep disturbance and suicidal ideas. The patient is not nervous/anxious.   All other systems reviewed and are negative.    VITALS:   Blood pressure (!) 151/51, pulse 90, temperature 97.8  F (36.6 C), temperature source Oral, resp. rate 18, weight 184 lb (83.5 kg), SpO2 98 %.  Wt Readings from Last 3 Encounters:  03/20/23 184 lb (83.5 kg)  03/20/23 184 lb 3.2 oz (83.6 kg)  02/28/23 181 lb 4.8 oz (82.2 kg)    Body mass index is 31.58 kg/m.  Performance status (ECOG): 1 - Symptomatic but completely ambulatory  PHYSICAL EXAM:   Physical Exam Vitals and nursing note reviewed. Exam conducted with a chaperone present.  Constitutional:      Appearance: Normal appearance.  Cardiovascular:     Rate and Rhythm: Normal rate and regular rhythm.     Pulses: Normal pulses.     Heart sounds: Normal heart sounds.  Pulmonary:     Effort: Pulmonary effort is normal.     Breath sounds: Normal breath sounds.  Abdominal:     Palpations: Abdomen is soft. There is no hepatomegaly, splenomegaly or mass.     Tenderness: There is no abdominal tenderness.  Musculoskeletal:     Right lower leg: No edema.     Left lower leg: No edema.  Lymphadenopathy:     Cervical: No cervical adenopathy.     Right cervical: No superficial, deep or posterior cervical adenopathy.    Left cervical: No superficial, deep or posterior cervical adenopathy.     Upper Body:     Right upper body: No supraclavicular or axillary adenopathy.     Left upper body: No supraclavicular or axillary adenopathy.  Neurological:     General: No focal deficit present.     Mental Status: She is alert and oriented to person, place, and time.  Psychiatric:        Mood and Affect: Mood normal.        Behavior: Behavior normal.     LABS:      Latest Ref Rng & Units 03/20/2023   12:38 PM 02/28/2023   10:17 AM 02/21/2023    9:21 AM  CBC  WBC 4.0 - 10.5 K/uL 9.5  7.1  5.7   Hemoglobin 12.0 - 15.0 g/dL 16.1  09.6  04.5   Hematocrit 36.0 - 46.0 % 34.8  32.5  32.1   Platelets 150 - 400 K/uL 310  298  300       Latest Ref Rng & Units 03/20/2023   12:38 PM 02/28/2023   10:17 AM 02/21/2023    9:21 AM  CMP  Glucose 70 - 99  mg/dL 97  409  811   BUN 8 - 23 mg/dL 25  19  27    Creatinine 0.44 - 1.00 mg/dL 9.14  7.82  9.56   Sodium 135 -  145 mmol/L 139  134  136   Potassium 3.5 - 5.1 mmol/L 4.0  3.9  3.8   Chloride 98 - 111 mmol/L 105  108  106   CO2 22 - 32 mmol/L 28  26  25    Calcium 8.9 - 10.3 mg/dL 9.3  8.5  8.6   Total Protein 6.5 - 8.1 g/dL 6.3  5.9  5.8   Total Bilirubin 0.3 - 1.2 mg/dL 0.6  0.3  0.5   Alkaline Phos 38 - 126 U/L 57  52  51   AST 15 - 41 U/L 17  16  16    ALT 0 - 44 U/L 21  21  21       No results found for: "CEA1", "CEA" / No results found for: "CEA1", "CEA" No results found for: "PSA1" No results found for: "ZOX096" No results found for: "CAN125"  Lab Results  Component Value Date   TOTALPROTELP 6.4 10/26/2022   ALBUMINELP 3.6 10/26/2022   A1GS 0.2 10/26/2022   A2GS 0.6 10/26/2022   BETS 1.1 10/26/2022   GAMS 0.9 10/26/2022   MSPIKE Not Observed 10/26/2022   SPEI Comment 10/26/2022   Lab Results  Component Value Date   TIBC 334 02/21/2023   TIBC 350 02/14/2023   FERRITIN 62 02/21/2023   FERRITIN 67 02/14/2023   IRONPCTSAT 10 (L) 02/21/2023   IRONPCTSAT 10 (L) 02/14/2023   Lab Results  Component Value Date   LDH 140 06/27/2011     STUDIES:   No results found.

## 2023-03-20 ENCOUNTER — Encounter: Payer: Self-pay | Admitting: Hematology

## 2023-03-20 ENCOUNTER — Inpatient Hospital Stay: Payer: PPO

## 2023-03-20 ENCOUNTER — Inpatient Hospital Stay: Payer: PPO | Attending: Hematology

## 2023-03-20 ENCOUNTER — Inpatient Hospital Stay (HOSPITAL_BASED_OUTPATIENT_CLINIC_OR_DEPARTMENT_OTHER): Payer: PPO | Admitting: Hematology

## 2023-03-20 VITALS — BP 151/51 | HR 90 | Temp 97.8°F | Resp 18 | Wt 184.0 lb

## 2023-03-20 VITALS — BP 138/57 | HR 77 | Temp 97.9°F | Resp 18

## 2023-03-20 VITALS — BP 151/51 | HR 90 | Resp 18 | Wt 184.2 lb

## 2023-03-20 DIAGNOSIS — Z5112 Encounter for antineoplastic immunotherapy: Secondary | ICD-10-CM | POA: Insufficient documentation

## 2023-03-20 DIAGNOSIS — D509 Iron deficiency anemia, unspecified: Secondary | ICD-10-CM | POA: Diagnosis not present

## 2023-03-20 DIAGNOSIS — Z170 Estrogen receptor positive status [ER+]: Secondary | ICD-10-CM | POA: Diagnosis not present

## 2023-03-20 DIAGNOSIS — Z95828 Presence of other vascular implants and grafts: Secondary | ICD-10-CM

## 2023-03-20 DIAGNOSIS — C50412 Malignant neoplasm of upper-outer quadrant of left female breast: Secondary | ICD-10-CM | POA: Diagnosis not present

## 2023-03-20 DIAGNOSIS — D508 Other iron deficiency anemias: Secondary | ICD-10-CM

## 2023-03-20 LAB — CBC WITH DIFFERENTIAL/PLATELET
Abs Immature Granulocytes: 0.03 10*3/uL (ref 0.00–0.07)
Basophils Absolute: 0.1 10*3/uL (ref 0.0–0.1)
Basophils Relative: 1 %
Eosinophils Absolute: 0.4 10*3/uL (ref 0.0–0.5)
Eosinophils Relative: 4 %
HCT: 34.8 % — ABNORMAL LOW (ref 36.0–46.0)
Hemoglobin: 11.1 g/dL — ABNORMAL LOW (ref 12.0–15.0)
Immature Granulocytes: 0 %
Lymphocytes Relative: 15 %
Lymphs Abs: 1.5 10*3/uL (ref 0.7–4.0)
MCH: 30.5 pg (ref 26.0–34.0)
MCHC: 31.9 g/dL (ref 30.0–36.0)
MCV: 95.6 fL (ref 80.0–100.0)
Monocytes Absolute: 0.8 10*3/uL (ref 0.1–1.0)
Monocytes Relative: 9 %
Neutro Abs: 6.8 10*3/uL (ref 1.7–7.7)
Neutrophils Relative %: 71 %
Platelets: 310 10*3/uL (ref 150–400)
RBC: 3.64 MIL/uL — ABNORMAL LOW (ref 3.87–5.11)
RDW: 15.1 % (ref 11.5–15.5)
WBC: 9.5 10*3/uL (ref 4.0–10.5)
nRBC: 0 % (ref 0.0–0.2)

## 2023-03-20 LAB — COMPREHENSIVE METABOLIC PANEL
ALT: 21 U/L (ref 0–44)
AST: 17 U/L (ref 15–41)
Albumin: 3.4 g/dL — ABNORMAL LOW (ref 3.5–5.0)
Alkaline Phosphatase: 57 U/L (ref 38–126)
Anion gap: 6 (ref 5–15)
BUN: 25 mg/dL — ABNORMAL HIGH (ref 8–23)
CO2: 28 mmol/L (ref 22–32)
Calcium: 9.3 mg/dL (ref 8.9–10.3)
Chloride: 105 mmol/L (ref 98–111)
Creatinine, Ser: 1.16 mg/dL — ABNORMAL HIGH (ref 0.44–1.00)
GFR, Estimated: 49 mL/min — ABNORMAL LOW (ref >60)
Glucose, Bld: 97 mg/dL (ref 70–99)
Potassium: 4 mmol/L (ref 3.5–5.1)
Sodium: 139 mmol/L (ref 135–145)
Total Bilirubin: 0.6 mg/dL (ref 0.3–1.2)
Total Protein: 6.3 g/dL — ABNORMAL LOW (ref 6.5–8.1)

## 2023-03-20 LAB — MAGNESIUM: Magnesium: 2.1 mg/dL (ref 1.7–2.4)

## 2023-03-20 MED ORDER — SODIUM CHLORIDE 0.9 % IV SOLN
510.0000 mg | Freq: Once | INTRAVENOUS | Status: AC
  Administered 2023-03-20: 510 mg via INTRAVENOUS
  Filled 2023-03-20: qty 17

## 2023-03-20 MED ORDER — SODIUM CHLORIDE 0.9 % IV SOLN: Freq: Once | INTRAVENOUS | Status: AC

## 2023-03-20 MED ORDER — CETIRIZINE HCL 10 MG/ML IV SOLN
10.0000 mg | Freq: Once | INTRAVENOUS | Status: AC
  Administered 2023-03-20: 10 mg via INTRAVENOUS
  Filled 2023-03-20: qty 1

## 2023-03-20 MED ORDER — ANASTROZOLE 1 MG PO TABS: 1.0000 mg | ORAL_TABLET | Freq: Every day | ORAL | 6 refills | Status: DC

## 2023-03-20 MED ORDER — ACETAMINOPHEN 325 MG PO TABS
650.0000 mg | ORAL_TABLET | Freq: Once | ORAL | Status: AC
  Administered 2023-03-20: 650 mg via ORAL
  Filled 2023-03-20: qty 2

## 2023-03-20 MED ORDER — SODIUM CHLORIDE 0.9% FLUSH
10.0000 mL | Freq: Once | INTRAVENOUS | Status: AC
  Administered 2023-03-20: 10 mL via INTRAVENOUS

## 2023-03-20 MED ORDER — SODIUM CHLORIDE 0.9% FLUSH
10.0000 mL | INTRAVENOUS | Status: DC | PRN
  Administered 2023-03-20: 10 mL

## 2023-03-20 MED ORDER — DIPHENHYDRAMINE HCL 25 MG PO CAPS: 50.0000 mg | ORAL_CAPSULE | Freq: Once | ORAL | Status: DC

## 2023-03-20 MED ORDER — HEPARIN SOD (PORK) LOCK FLUSH 100 UNIT/ML IV SOLN
500.0000 [IU] | Freq: Once | INTRAVENOUS | Status: AC | PRN
  Administered 2023-03-20: 500 [IU]

## 2023-03-20 MED ORDER — TRASTUZUMAB-ANNS CHEMO 420 MG IV SOLR
8.0000 mg/kg | Freq: Once | INTRAVENOUS | Status: AC
  Administered 2023-03-20: 672 mg via INTRAVENOUS
  Filled 2023-03-20: qty 32

## 2023-03-20 NOTE — Progress Notes (Signed)
Discontinue diphenhydramine from oncology treatment plan --> Add Quzyttir (cetirizine) 10 mg IVPush x 1 as premedication for oncology treatment plan.  Patient 03/07/23 appointment was canceled and Kanjinti was not given.  03/20/23 - reload Kanjinti with >7 day since last dose at 8 mg/kg for today.  T.O. Dr Carilyn Goodpasture, PharmD

## 2023-03-20 NOTE — Progress Notes (Signed)
Patient presents today for chemotherapy infusion. Patient is in satisfactory condition with no new complaints voiced.  Vital signs are stable.  Labs reviewed by Dr. Ellin Saba during the office visit and all labs are within treatment parameters.  We will proceed with treatment per MD orders.   Patient will also get last dose of Feraheme today.    Patient tolerated treatment and Feraheme well with no complaints voiced.  Patient left ambulatory in stable condition.  Vital signs stable at discharge.  Follow up as scheduled.

## 2023-03-20 NOTE — Patient Instructions (Signed)
[facility] Discharge Instructions   You were seen and examined today by Dr. Ellin Saba.  He reviewed the results of your lab work which are normal/stable.   We will proceed with your treatment today.   Dr. Kirtland Bouchard discussed starting you on a pill called anastrozole. This is an estrogen-blocking pill that prevents the cancer from recurring as your cancer is a type that feeds on estrogen.   He discussed with you the results of your bone density test. It shows you have osteopenia, which means your bones are weaker than normal, but you are not to the point of having osteoporosis yet.   Return as scheduled.    Thank you for choosing  [facility] Center at [facility] to provide your oncology and hematology care.  To afford each patient quality time with our provider, please arrive at least 15 minutes before your scheduled appointment time.   If you have a lab appointment with the Cancer Center please come in thru the Main Entrance and check in at the main information desk.  You need to re-schedule your appointment should you arrive 10 or more minutes late.  We strive to give you quality time with our providers, and arriving late affects you and other patients whose appointments are after yours.  Also, if you no show three or more times for appointments you may be dismissed from the clinic at the providers discretion.     Again, thank you for choosing [facility].  Our hope is that these requests will decrease the amount of time that you wait before being seen by our physicians.       _____________________________________________________________  Should you have questions after your visit to [facility], please contact our office at [phone] and follow the prompts.  Our office hours are 8:00 a.m. and 4:30 p.m. Monday - Friday.  Please note that voicemails left after 4:00 p.m. may not be returned until the following  business day.  We are closed weekends and major holidays.  You do have access to a nurse 24-7, just call the main number to the clinic [phone] and do not press any options, hold on the line and a nurse will answer the phone.    For prescription refill requests, have your pharmacy contact our office and allow 72 hours.    Due to Covid, you will need to wear a mask upon entering the hospital. If you do not have a mask, a mask will be given to you at the Main Entrance upon arrival. For doctor visits, patients may have 1 support person age 77 or older with them. For treatment visits, patients can not have anyone with them due to social distancing guidelines and our immunocompromised population.

## 2023-03-20 NOTE — Patient Instructions (Signed)
[facility]  Discharge Instructions: Thank you for choosing [facility] to provide your oncology and hematology care.  If you have a lab appointment with the Cancer Center - please note that after April 8th, 2024, all labs will be drawn in the cancer center.  You do not have to check in or register with the main entrance as you have in the past but will complete your check-in in the cancer center.  Wear comfortable clothing and clothing appropriate for easy access to any Portacath or PICC line.   We strive to give you quality time with your provider. You may need to reschedule your appointment if you arrive late (15 or more minutes).  Arriving late affects you and other patients whose appointments are after yours.  Also, if you miss three or more appointments without notifying the office, you may be dismissed from the clinic at the provider's discretion.      For prescription refill requests, have your pharmacy contact our office and allow 72 hours for refills to be completed.    Today you received the following chemotherapy and/or immunotherapy agents Kanjinti.   Ferumoxytol Injection What is this medication? FERUMOXYTOL (FER ue MOX i tol) treats low levels of iron in your body (iron deficiency anemia). Iron is a mineral that plays an important role in making red blood cells, which carry oxygen from your lungs to the rest of your body. This medicine may be used for other purposes; ask your health care provider or pharmacist if you have questions. COMMON BRAND NAME(S): Feraheme What should I tell my care team before I take this medication? They need to know if you have any of these conditions: Anemia not caused by low iron levels High levels of iron in the blood Magnetic resonance imaging (MRI) test scheduled An unusual or allergic reaction to iron, other medications, foods, dyes, or preservatives Pregnant or trying to get pregnant Breastfeeding How  should I use this medication? This medication is injected into a vein. It is given by your care team in a hospital or clinic setting. Talk to your care team the use of this medication in children. Special care may be needed. Overdosage: If you think you have taken too much of this medicine contact a poison control center or emergency room at once. NOTE: This medicine is only for you. Do not share this medicine with others. What if I miss a dose? It is important not to miss your dose. Call your care team if you are unable to keep an appointment. What may interact with this medication? Other iron products This list may not describe all possible interactions. Give your health care provider a list of all the medicines, herbs, non-prescription drugs, or dietary supplements you use. Also tell them if you smoke, drink alcohol, or use illegal drugs. Some items may interact with your medicine. What should I watch for while using this medication? Visit your care team regularly. Tell your care team if your symptoms do not start to get better or if they get worse. You may need blood work done while you are taking this medication. You may need to follow a special diet. Talk to your care team. Foods that contain iron include: whole grains/cereals, dried fruits, beans, or peas, leafy green vegetables, and organ meats (liver, kidney). What side effects may I notice from receiving this medication? Side effects that you should report to your care team as soon as possible: Allergic reactions--skin rash, itching,  hives, swelling of the face, lips, tongue, or throat Low blood pressure--dizziness, feeling faint or lightheaded, blurry vision Shortness of breath Side effects that usually do not require medical attention (report to your care team if they continue or are bothersome): Flushing Headache Joint pain Muscle pain Nausea Pain, redness, or irritation at injection site This list may not describe all possible  side effects. Call your doctor for medical advice about side effects. You may report side effects to FDA at 1-800-FDA-1088. Where should I keep my medication? This medication is given in a hospital or clinic and will not be stored at home. NOTE: This sheet is a summary. It may not cover all possible information. If you have questions about this medicine, talk to your doctor, pharmacist, or health care provider.  2023 Elsevier/Gold Standard (2021-03-24 00:00:00)   Trastuzumab Injection What is this medication? TRASTUZUMAB (tras TOO zoo mab) treats breast cancer and stomach cancer. It works by blocking a protein that causes cancer cells to grow and multiply. This helps to slow or stop the spread of cancer cells. This medicine may be used for other purposes; ask your health care provider or pharmacist if you have questions. COMMON BRAND NAME(S): Herceptin, Marlowe Alt, Ontruzant, Trazimera What should I tell my care team before I take this medication? They need to know if you have any of these conditions: Heart failure Lung disease An unusual or allergic reaction to trastuzumab, other medications, foods, dyes, or preservatives Pregnant or trying to get pregnant Breast-feeding How should I use this medication? This medication is injected into a vein. It is given by your care team in a hospital or clinic setting. Talk to your care team about the use of this medication in children. It is not approved for use in children. Overdosage: If you think you have taken too much of this medicine contact a poison control center or emergency room at once. NOTE: This medicine is only for you. Do not share this medicine with others. What if I miss a dose? Keep appointments for follow-up doses. It is important not to miss your dose. Call your care team if you are unable to keep an appointment. What may interact with this medication? Certain types of chemotherapy, such as daunorubicin,  doxorubicin, epirubicin, idarubicin This list may not describe all possible interactions. Give your health care provider a list of all the medicines, herbs, non-prescription drugs, or dietary supplements you use. Also tell them if you smoke, drink alcohol, or use illegal drugs. Some items may interact with your medicine. What should I watch for while using this medication? Your condition will be monitored carefully while you are receiving this medication. This medication may make you feel generally unwell. This is not uncommon, as chemotherapy affects healthy cells as well as cancer cells. Report any side effects. Continue your course of treatment even though you feel ill unless your care team tells you to stop. This medication may increase your risk of getting an infection. Call your care team for advice if you get a fever, chills, sore throat, or other symptoms of a cold or flu. Do not treat yourself. Try to avoid being around people who are sick. Avoid taking medications that contain aspirin, acetaminophen, ibuprofen, naproxen, or ketoprofen unless instructed by your care team. These medications can hide a fever. Talk to your care team if you may be pregnant. Serious birth defects can occur if you take this medication during pregnancy and for 7 months after the last dose. You  will need a negative pregnancy test before starting this medication. Contraception is recommended while taking this medication and for 7 months after the last dose. Your care team can help you find the option that works for you. Do not breastfeed while taking this medication and for 7 months after stopping treatment. What side effects may I notice from receiving this medication? Side effects that you should report to your care team as soon as possible: Allergic reactions or angioedema--skin rash, itching or hives, swelling of the face, eyes, lips, tongue, arms, or legs, trouble swallowing or breathing Dry cough, shortness of  breath or trouble breathing Heart failure--shortness of breath, swelling of the ankles, feet, or hands, sudden weight gain, unusual weakness or fatigue Infection--fever, chills, cough, or sore throat Infusion reactions--chest pain, shortness of breath or trouble breathing, feeling faint or lightheaded Side effects that usually do not require medical attention (report to your care team if they continue or are bothersome): Diarrhea Dizziness Headache Nausea Trouble sleeping Vomiting This list may not describe all possible side effects. Call your doctor for medical advice about side effects. You may report side effects to FDA at 1-800-FDA-1088. Where should I keep my medication? This medication is given in a hospital or clinic. It will not be stored at home. NOTE: This sheet is a summary. It may not cover all possible information. If you have questions about this medicine, talk to your doctor, pharmacist, or health care provider.  2023 Elsevier/Gold Standard (2022-03-03 00:00:00)        To help prevent nausea and vomiting after your treatment, we encourage you to take your nausea medication as directed.  BELOW ARE SYMPTOMS THAT SHOULD BE REPORTED IMMEDIATELY: *FEVER GREATER THAN 100.4 F (38 C) OR HIGHER *CHILLS OR SWEATING *NAUSEA AND VOMITING THAT IS NOT CONTROLLED WITH YOUR NAUSEA MEDICATION *UNUSUAL SHORTNESS OF BREATH *UNUSUAL BRUISING OR BLEEDING *URINARY PROBLEMS (pain or burning when urinating, or frequent urination) *BOWEL PROBLEMS (unusual diarrhea, constipation, pain near the anus) TENDERNESS IN MOUTH AND THROAT WITH OR WITHOUT PRESENCE OF ULCERS (sore throat, sores in mouth, or a toothache) UNUSUAL RASH, SWELLING OR PAIN  UNUSUAL VAGINAL DISCHARGE OR ITCHING   Items with * indicate a potential emergency and should be followed up as soon as possible or go to the Emergency Department if any problems should occur.  Please show the CHEMOTHERAPY ALERT CARD or IMMUNOTHERAPY  ALERT CARD at check-in to the Emergency Department and triage nurse.  Should you have questions after your visit or need to cancel or reschedule your appointment, please contact [facility] [phone]  and follow the prompts.  Office hours are 8:00 a.m. to 4:30 p.m. Monday - Friday. Please note that voicemails left after 4:00 p.m. may not be returned until the following business day.  We are closed weekends and major holidays. You have access to a nurse at all times for urgent questions. Please call the main number to the clinic [phone] and follow the prompts.  For any non-urgent questions, you may also contact your provider using MyChart. We now offer e-Visits for anyone 51 and older to request care online for non-urgent symptoms. For details visit [email or web address].   Also download the MyChart app! Go to the app store, search "MyChart", open the app, select [place], and log in with your MyChart username and password.

## 2023-03-28 ENCOUNTER — Ambulatory Visit: Payer: PPO | Admitting: Hematology

## 2023-03-28 ENCOUNTER — Ambulatory Visit: Payer: PPO

## 2023-03-28 ENCOUNTER — Other Ambulatory Visit: Payer: PPO

## 2023-04-01 ENCOUNTER — Other Ambulatory Visit: Payer: Self-pay

## 2023-04-05 ENCOUNTER — Other Ambulatory Visit: Payer: Self-pay

## 2023-04-05 DIAGNOSIS — Z170 Estrogen receptor positive status [ER+]: Secondary | ICD-10-CM

## 2023-04-08 ENCOUNTER — Other Ambulatory Visit: Payer: Self-pay

## 2023-04-11 ENCOUNTER — Inpatient Hospital Stay: Payer: PPO

## 2023-04-11 VITALS — BP 130/62 | HR 80 | Temp 98.6°F | Resp 18

## 2023-04-11 VITALS — BP 139/60 | HR 84 | Temp 98.7°F | Resp 18 | Wt 182.4 lb

## 2023-04-11 DIAGNOSIS — Z170 Estrogen receptor positive status [ER+]: Secondary | ICD-10-CM

## 2023-04-11 DIAGNOSIS — Z5112 Encounter for antineoplastic immunotherapy: Secondary | ICD-10-CM | POA: Diagnosis not present

## 2023-04-11 DIAGNOSIS — C50412 Malignant neoplasm of upper-outer quadrant of left female breast: Secondary | ICD-10-CM

## 2023-04-11 DIAGNOSIS — Z95828 Presence of other vascular implants and grafts: Secondary | ICD-10-CM

## 2023-04-11 LAB — CBC WITH DIFFERENTIAL/PLATELET
Abs Immature Granulocytes: 0.02 10*3/uL (ref 0.00–0.07)
Basophils Absolute: 0 10*3/uL (ref 0.0–0.1)
Basophils Relative: 0 %
Eosinophils Absolute: 0.3 10*3/uL (ref 0.0–0.5)
Eosinophils Relative: 4 %
HCT: 35.3 % — ABNORMAL LOW (ref 36.0–46.0)
Hemoglobin: 11.5 g/dL — ABNORMAL LOW (ref 12.0–15.0)
Immature Granulocytes: 0 %
Lymphocytes Relative: 19 %
Lymphs Abs: 1.6 10*3/uL (ref 0.7–4.0)
MCH: 31.3 pg (ref 26.0–34.0)
MCHC: 32.6 g/dL (ref 30.0–36.0)
MCV: 96.2 fL (ref 80.0–100.0)
Monocytes Absolute: 0.7 10*3/uL (ref 0.1–1.0)
Monocytes Relative: 9 %
Neutro Abs: 5.4 10*3/uL (ref 1.7–7.7)
Neutrophils Relative %: 68 %
Platelets: 252 10*3/uL (ref 150–400)
RBC: 3.67 MIL/uL — ABNORMAL LOW (ref 3.87–5.11)
RDW: 13.4 % (ref 11.5–15.5)
WBC: 8 10*3/uL (ref 4.0–10.5)
nRBC: 0 % (ref 0.0–0.2)

## 2023-04-11 LAB — COMPREHENSIVE METABOLIC PANEL
ALT: 19 U/L (ref 0–44)
AST: 14 U/L — ABNORMAL LOW (ref 15–41)
Albumin: 3.5 g/dL (ref 3.5–5.0)
Alkaline Phosphatase: 60 U/L (ref 38–126)
Anion gap: 12 (ref 5–15)
BUN: 19 mg/dL (ref 8–23)
CO2: 22 mmol/L (ref 22–32)
Calcium: 9.5 mg/dL (ref 8.9–10.3)
Chloride: 101 mmol/L (ref 98–111)
Creatinine, Ser: 1.24 mg/dL — ABNORMAL HIGH (ref 0.44–1.00)
GFR, Estimated: 45 mL/min — ABNORMAL LOW (ref >60)
Glucose, Bld: 81 mg/dL (ref 70–99)
Potassium: 3.9 mmol/L (ref 3.5–5.1)
Sodium: 135 mmol/L (ref 135–145)
Total Bilirubin: 0.5 mg/dL (ref 0.3–1.2)
Total Protein: 6.3 g/dL — ABNORMAL LOW (ref 6.5–8.1)

## 2023-04-11 LAB — MAGNESIUM: Magnesium: 2.1 mg/dL (ref 1.7–2.4)

## 2023-04-11 MED ORDER — SODIUM CHLORIDE 0.9% FLUSH
10.0000 mL | INTRAVENOUS | Status: DC | PRN
  Administered 2023-04-11: 10 mL

## 2023-04-11 MED ORDER — TRASTUZUMAB-ANNS CHEMO 420 MG IV SOLR
6.0000 mg/kg | Freq: Once | INTRAVENOUS | Status: AC
  Administered 2023-04-11: 504 mg via INTRAVENOUS
  Filled 2023-04-11: qty 24

## 2023-04-11 MED ORDER — SODIUM CHLORIDE 0.9 % IV SOLN: Freq: Once | INTRAVENOUS | Status: AC

## 2023-04-11 MED ORDER — CETIRIZINE HCL 10 MG/ML IV SOLN
10.0000 mg | Freq: Once | INTRAVENOUS | Status: AC
  Administered 2023-04-11: 10 mg via INTRAVENOUS
  Filled 2023-04-11: qty 1

## 2023-04-11 MED ORDER — SODIUM CHLORIDE 0.9% FLUSH
10.0000 mL | Freq: Once | INTRAVENOUS | Status: AC
  Administered 2023-04-11: 10 mL via INTRAVENOUS

## 2023-04-11 MED ORDER — ACETAMINOPHEN 325 MG PO TABS
650.0000 mg | ORAL_TABLET | Freq: Once | ORAL | Status: AC
  Administered 2023-04-11: 650 mg via ORAL
  Filled 2023-04-11: qty 2

## 2023-04-11 MED ORDER — HEPARIN SOD (PORK) LOCK FLUSH 100 UNIT/ML IV SOLN
500.0000 [IU] | Freq: Once | INTRAVENOUS | Status: AC | PRN
  Administered 2023-04-11: 500 [IU]

## 2023-04-11 NOTE — Patient Instructions (Signed)
[facility] [place]  Discharge Instructions: Thank you for choosing [facility] to provide your oncology and hematology care.  If you have a lab appointment with the Cancer Center - please note that after April 8th, 2024, all labs will be drawn in the cancer center.  You do not have to check in or register with the main entrance as you have in the past but will complete your check-in in the cancer center.  Wear comfortable clothing and clothing appropriate for easy access to any Portacath or PICC line.   We strive to give you quality time with your provider. You may need to reschedule your appointment if you arrive late (15 or more minutes).  Arriving late affects you and other patients whose appointments are after yours.  Also, if you miss three or more appointments without notifying the office, you may be dismissed from the clinic at the provider's discretion.      For prescription refill requests, have your pharmacy contact our office and allow 72 hours for refills to be completed.    Today you received the following chemotherapy and/or immunotherapy agents Kanjinti.  Trastuzumab Injection What is this medication? TRASTUZUMAB (tras TOO zoo mab) treats breast cancer and stomach cancer. It works by blocking a protein that causes cancer cells to grow and multiply. This helps to slow or stop the spread of cancer cells. This medicine may be used for other purposes; ask your health care provider or pharmacist if you have questions. COMMON BRAND NAME(S): Herceptin, Marlowe Alt, Ontruzant, Trazimera What should I tell my care team before I take this medication? They need to know if you have any of these conditions: Heart failure Lung disease An unusual or allergic reaction to trastuzumab, other medications, foods, dyes, or preservatives Pregnant or trying to get pregnant Breast-feeding How should I use this medication? This medication is injected into a  vein. It is given by your care team in a hospital or clinic setting. Talk to your care team about the use of this medication in children. It is not approved for use in children. Overdosage: If you think you have taken too much of this medicine contact a poison control center or emergency room at once. NOTE: This medicine is only for you. Do not share this medicine with others. What if I miss a dose? Keep appointments for follow-up doses. It is important not to miss your dose. Call your care team if you are unable to keep an appointment. What may interact with this medication? Certain types of chemotherapy, such as daunorubicin, doxorubicin, epirubicin, idarubicin This list may not describe all possible interactions. Give your health care provider a list of all the medicines, herbs, non-prescription drugs, or dietary supplements you use. Also tell them if you smoke, drink alcohol, or use illegal drugs. Some items may interact with your medicine. What should I watch for while using this medication? Your condition will be monitored carefully while you are receiving this medication. This medication may make you feel generally unwell. This is not uncommon, as chemotherapy affects healthy cells as well as cancer cells. Report any side effects. Continue your course of treatment even though you feel ill unless your care team tells you to stop. This medication may increase your risk of getting an infection. Call your care team for advice if you get a fever, chills, sore throat, or other symptoms of a cold or flu. Do not treat yourself. Try to avoid being around people who  are sick. Avoid taking medications that contain aspirin, acetaminophen, ibuprofen, naproxen, or ketoprofen unless instructed by your care team. These medications can hide a fever. Talk to your care team if you may be pregnant. Serious birth defects can occur if you take this medication during pregnancy and for 7 months after the last dose. You  will need a negative pregnancy test before starting this medication. Contraception is recommended while taking this medication and for 7 months after the last dose. Your care team can help you find the option that works for you. Do not breastfeed while taking this medication and for 7 months after stopping treatment. What side effects may I notice from receiving this medication? Side effects that you should report to your care team as soon as possible: Allergic reactions or angioedema--skin rash, itching or hives, swelling of the face, eyes, lips, tongue, arms, or legs, trouble swallowing or breathing Dry cough, shortness of breath or trouble breathing Heart failure--shortness of breath, swelling of the ankles, feet, or hands, sudden weight gain, unusual weakness or fatigue Infection--fever, chills, cough, or sore throat Infusion reactions--chest pain, shortness of breath or trouble breathing, feeling faint or lightheaded Side effects that usually do not require medical attention (report to your care team if they continue or are bothersome): Diarrhea Dizziness Headache Nausea Trouble sleeping Vomiting This list may not describe all possible side effects. Call your doctor for medical advice about side effects. You may report side effects to FDA at 1-800-FDA-1088. Where should I keep my medication? This medication is given in a hospital or clinic. It will not be stored at home. NOTE: This sheet is a summary. It may not cover all possible information. If you have questions about this medicine, talk to your doctor, pharmacist, or health care provider.  2024 Elsevier/Gold Standard (2022-03-15 00:00:00)        To help prevent nausea and vomiting after your treatment, we encourage you to take your nausea medication as directed.  BELOW ARE SYMPTOMS THAT SHOULD BE REPORTED IMMEDIATELY: *FEVER GREATER THAN 100.4 F (38 C) OR HIGHER *CHILLS OR SWEATING *NAUSEA AND VOMITING THAT IS NOT CONTROLLED  WITH YOUR NAUSEA MEDICATION *UNUSUAL SHORTNESS OF BREATH *UNUSUAL BRUISING OR BLEEDING *URINARY PROBLEMS (pain or burning when urinating, or frequent urination) *BOWEL PROBLEMS (unusual diarrhea, constipation, pain near the anus) TENDERNESS IN MOUTH AND THROAT WITH OR WITHOUT PRESENCE OF ULCERS (sore throat, sores in mouth, or a toothache) UNUSUAL RASH, SWELLING OR PAIN  UNUSUAL VAGINAL DISCHARGE OR ITCHING   Items with * indicate a potential emergency and should be followed up as soon as possible or go to the Emergency Department if any problems should occur.  Please show the CHEMOTHERAPY ALERT CARD or IMMUNOTHERAPY ALERT CARD at check-in to the Emergency Department and triage nurse.  Should you have questions after your visit or need to cancel or reschedule your appointment, please contact [facility] [phone]  and follow the prompts.  Office hours are 8:00 a.m. to 4:30 p.m. Monday - Friday. Please note that voicemails left after 4:00 p.m. may not be returned until the following business day.  We are closed weekends and major holidays. You have access to a nurse at all times for urgent questions. Please call the main number to the clinic [phone] and follow the prompts.  For any non-urgent questions, you may also contact your provider using MyChart. We now offer e-Visits for anyone 73 and older to request care online for non-urgent symptoms. For details visit [email or web address].  Also download the MyChart app! Go to the app store, search "MyChart", open the app, select [place], and log in with your MyChart username and password.

## 2023-04-11 NOTE — Progress Notes (Signed)
Patient presents today for chemotherapy infusion.  Patient is in satisfactory condition with no new complaints voiced.  Vital signs are stable.  Labs reviewed and all labs are within treatment parameters. We will proceed with treatment per MD orders.   Patient tolerated treatment well with no complaints voiced.  Patient left ambulatory in stable condition.  Vital signs stable at discharge.  Follow up as scheduled.    

## 2023-04-18 ENCOUNTER — Ambulatory Visit: Payer: PPO | Admitting: Hematology

## 2023-04-18 ENCOUNTER — Other Ambulatory Visit: Payer: PPO

## 2023-04-18 ENCOUNTER — Ambulatory Visit: Payer: PPO

## 2023-04-24 ENCOUNTER — Other Ambulatory Visit: Payer: Self-pay

## 2023-04-24 DIAGNOSIS — C50412 Malignant neoplasm of upper-outer quadrant of left female breast: Secondary | ICD-10-CM

## 2023-04-26 ENCOUNTER — Ambulatory Visit (HOSPITAL_COMMUNITY)
Admission: RE | Admit: 2023-04-26 | Discharge: 2023-04-26 | Disposition: A | Discharge status: Home / Self Care | Payer: PPO | Source: Ambulatory Visit | Attending: Hematology | Admitting: Hematology

## 2023-04-26 DIAGNOSIS — Z0189 Encounter for other specified special examinations: Secondary | ICD-10-CM | POA: Diagnosis not present

## 2023-04-26 DIAGNOSIS — Z87891 Personal history of nicotine dependence: Secondary | ICD-10-CM | POA: Diagnosis not present

## 2023-04-26 DIAGNOSIS — C50412 Malignant neoplasm of upper-outer quadrant of left female breast: Secondary | ICD-10-CM | POA: Insufficient documentation

## 2023-04-26 DIAGNOSIS — Z780 Asymptomatic menopausal state: Secondary | ICD-10-CM | POA: Diagnosis not present

## 2023-04-26 DIAGNOSIS — M8580 Other specified disorders of bone density and structure, unspecified site: Secondary | ICD-10-CM | POA: Diagnosis not present

## 2023-04-26 DIAGNOSIS — I10 Essential (primary) hypertension: Secondary | ICD-10-CM | POA: Insufficient documentation

## 2023-04-26 DIAGNOSIS — M85832 Other specified disorders of bone density and structure, left forearm: Secondary | ICD-10-CM | POA: Diagnosis not present

## 2023-04-26 DIAGNOSIS — Z170 Estrogen receptor positive status [ER+]: Secondary | ICD-10-CM | POA: Insufficient documentation

## 2023-04-26 DIAGNOSIS — I081 Rheumatic disorders of both mitral and tricuspid valves: Secondary | ICD-10-CM | POA: Diagnosis not present

## 2023-04-26 LAB — ECHOCARDIOGRAM COMPLETE
AR max vel: 1.69 cm2
AV Area VTI: 1.79 cm2
AV Area mean vel: 1.85 cm2
AV Mean grad: 7 mmHg
AV Peak grad: 13.5 mmHg
Ao pk vel: 1.84 m/s
Area-P 1/2: 2.95 cm2
Calc EF: 61.6 %
MV M vel: 5.26 m/s
MV Peak grad: 110.7 mmHg
S' Lateral: 2.7 cm
Single Plane A2C EF: 62.4 %
Single Plane A4C EF: 62.8 %

## 2023-04-26 NOTE — Progress Notes (Signed)
*  PRELIMINARY RESULTS* Echocardiogram 2D Echocardiogram has been performed.  [name] 04/26/2023, 10:47 AM

## 2023-04-30 NOTE — Progress Notes (Signed)
[facility] [address], [place] [address]    Clinic Day:  05/01/2023  Referring physician: Benita Stabile, MD  Patient Care Team: Benita Stabile, MD as PCP - General (Internal Medicine) Patrica Duel, MD (Inactive) (Internal Medicine) Cyndia Bent, MD (Inactive) as Surgeon (General Surgery) Doreatha Massed, MD as Medical Oncologist (Medical Oncology) Therese Sarah, RN as Oncology Nurse Navigator (Medical Oncology)   ASSESSMENT & PLAN:   Assessment: 1.  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  In the 2:00 location 3 cm from the nipple, mass measures 0.8 x 0.9 x 0.9 cm.  Measured together, these lesions span 2.4 cm in the 2 o'clock position.  Left axilla is negative for adenopathy. - Biopsy (09/22/2022): 1. left breast 2:00, 1 cmfn-invasive ductal carcinoma, grade 2, ER 100%, PR 95%, Ki-67 20%, HER2 2+ by IHC, HER2 FISH positive with ratio of 2.45 2.  Left breast 2:00 3 cmfn-invasive ductal carcinoma, grade 2, ER/PR 100%, Ki-67 15%, HER2 2+ by IHC, FISH negative - Left mastectomy, SLNB by Dr. Dwain Sarna on 10/31/2022 - Pathology: 2.5 x 2.1 x 1.2 cm invasive moderately differentiated adenocarcinoma, grade 2, margins free, extensive lymphatic invasion.  0/1 lymph node positive.  Associated focal intermediate grade DCIS. - Adjuvant chemotherapy with weekly paclitaxel and Herceptin started on 12/13/2022, 12 cycles of Taxol completed on 02/28/2023.  With Herceptin every 3 weeks ongoing - Anastrozole started on 03/20/2023.   2.  Social/family history: - Lives at home by herself and is independent of ADLs and IADLs.  She worked as an Software engineer.  Quit smoking 40 years ago. - Brother had thyroid cancer.   3.  Stage I  right breast cancer: - Diagnosed on 05/15/2006, right breast 11 o'clock position - Right mastectomy (08/15/2006): 1.3 cm IDC, ER 83% positive, PR 84% positive, HER2 negative, Ki-67 4% - Tamoxifen followed by exemestane followed by letrozole for 10 years.   4.  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  Osteopenia: - Last bone density on 02/03/2021: T-score -1.9. - We will plan to obtain DEXA scan prior to initiation of AI.    Plan: 1.  Stage Ia (T2 N0) HER2 positive left breast cancer: - She has started anastrozole and is tolerating reasonably well.  Occasional hot flashes noted.  No arthralgias. - Reviewed labs today: Creatinine 1.26 and stable.  LFTs normal.  CBC grossly normal. - We also discussed bone density results from 04/26/2023: Normal density in the lumbar spine. - Continue Herceptin every 3 weeks.  RTC 9 weeks for toxicity assessment.   2.  High risk drug monitoring: - We reviewed echocardiogram from 04/26/2023: EF 65-70%.   3.  Peripheral neuropathy: - She reports tingling in the lower legs and feet has been more prominent lately since she completed paclitaxel.  She has this tingling even prior to start of chemotherapy.  I have discussed option of gabapentin.  She would like to think about it and let me know.   4.  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 - She completed 2 infusions of Feraheme on 03/20/2023.  Hemoglobin today is 11.3.    No orders of the defined types were placed  in this encounter.     I,Katie Daubenspeck,acting as a Neurosurgeon for Doreatha Massed, MD.,have documented all relevant documentation on the behalf of Doreatha Massed, MD,as directed by  Doreatha Massed, MD while in the presence of Doreatha Massed, MD.   I, Doreatha Massed MD, have reviewed the above documentation for accuracy and completeness, and I agree with the above.   Doreatha Massed, MD    6/17/20242:16 PM  CHIEF COMPLAINT:   Diagnosis: stage Ib HER2 positive left breast cancer    Cancer Staging  Breast cancer of upper-outer quadrant of left female breast [facility]) Staging form: Breast, AJCC 8th Edition - Clinical stage from 10/25/2022: Stage IB (cT2, cN0, cM0, G2, ER+, PR+, HER2+) - Unsigned    Prior Therapy: 1. Left mastectomy and SLNB by Dr. Dwain Sarna on 10/31/2022  2. Trastuzumab and Herceptin 12/13/22 - 02/28/23  Current Therapy:  Herceptin and anastrozole    HISTORY OF PRESENT ILLNESS:   Oncology History  Breast cancer of upper-outer quadrant of left female breast (HCC)  10/25/2022 Initial Diagnosis   Breast cancer of upper-outer quadrant of left female breast (HCC)   12/13/2022 -  Chemotherapy   Patient is on Treatment Plan : BREAST Paclitaxel + Trastuzumab q7d / Trastuzumab q21d        INTERVAL HISTORY:   Haley Peterson is a 77 y.o. female presenting to clinic today for follow up of stage Ib HER2 positive left breast cancer. She was last seen by me on 03/20/23.  Since her last visit, she underwent repeat DEXA scan on 04/26/23 showing a T-score of -2 at left radius, which is considered osteopenic.  Today, she states that she is doing well overall. Her appetite level is at 100%. Her energy level is at 80%.  PAST MEDICAL HISTORY:   Past Medical History: Past Medical History:  Diagnosis Date   Arthritis    Bone spur    left heel   Breast cancer (HCC)    bilateral   Cancer (HCC)    Bilateral Breast cancer   Cataract    right eye    CKD (chronic kidney disease), stage III (HCC)    mgd by PCP    History of breast cancer    left, right   Hypertension    Personal history of radiation therapy    Pneumonia 10/2012   Port-A-Cath in place 12/06/2022    Surgical History: Past Surgical History:  Procedure Laterality Date   ABDOMINAL HYSTERECTOMY     BIOPSY  04/12/2017   Procedure: BIOPSY;  Surgeon: Malissa Hippo, MD;  Location: [facility];  Service:  Endoscopy;;  ascending colon ulcer biopsies   BREAST BIOPSY Left 09/22/2022   [place] LT BREAST BX W LOC DEV EA ADD LESION IMG BX SPEC US GUIDE 09/22/2022 [facility]   BREAST BIOPSY Left 09/22/2022   [place] LT BREAST BX W LOC DEV 1ST LESION IMG BX SPEC US GUIDE 09/22/2022 [facility]   BREAST LUMPECTOMY  08/28/2001   left   BREAST RECONSTRUCTION Right    implant   CHOLECYSTECTOMY  2002   COLONOSCOPY     COLONOSCOPY N/A 04/12/2017   Procedure: COLONOSCOPY;  Surgeon: Malissa Hippo, MD;  Location: [facility];  Service: Endoscopy;  Laterality: N/A;  830   MASTECTOMY  08/15/2006   right   PARTIAL HYSTERECTOMY  1985   PORTACATH PLACEMENT Right 10/31/2022   Procedure: INSERTION PORT-A-CATH;  Surgeon: Emelia Loron, MD;  Location: [facility];  Service: General;  Laterality: Right;  REPLACEMENT TOTAL KNEE  2009, 2010   right-2010, left 2009   SIMPLE MASTECTOMY WITH AXILLARY SENTINEL NODE BIOPSY Left 10/31/2022   Procedure: LEFT MASTECTOMY;  Surgeon: Emelia Loron, MD;  Location: [facility];  Service: General;  Laterality: Left;  90 MIN ROOM 2   TISSUE EXPANDER PLACEMENT     TOTAL HIP ARTHROPLASTY Right 10/09/2017   Procedure: RIGHT TOTAL HIP ARTHROPLASTY ANTERIOR APPROACH;  Surgeon: Gean Birchwood, MD;  Location: [facility];  Service: Orthopedics;  Laterality: Right;   TOTAL HIP ARTHROPLASTY Left 09/23/2019   Procedure: Left Anterior Hip Arthroplasty;  Surgeon: Gean Birchwood, MD;  Location: [facility];  Service: Orthopedics;  Laterality: Left;    Social History: Social History   Socioeconomic History   Marital status: Divorced    Spouse name: Not on file   Number of children: Not on file   Years of education: Not on file   Highest education level: Not on file  Occupational History   Not on file  Tobacco Use   Smoking status: Former    Packs/day: 0.50    Years: 5.00    Additional pack years: 0.00    Total pack years: 2.50    Types: Cigarettes     Quit date: 09/28/1983    Years since quitting: 39.6   Smokeless tobacco: Never  Vaping Use   Vaping Use: Never used  Substance and Sexual Activity   Alcohol use: No   Drug use: No   Sexual activity: Not on file  Other Topics Concern   Not on file  Social History Narrative   Not on file   Social Determinants of Health   Financial Resource Strain: Not on file  Food Insecurity: Not on file  Transportation Needs: Not on file  Physical Activity: Not on file  Stress: Not on file  Social Connections: Not on file  Intimate Partner Violence: Not on file    Family History: Family History  Problem Relation Age of Onset   Arthritis Mother    Thyroid cancer Brother        2010   Colon cancer Neg Hx    Breast cancer Neg Hx     Current Medications:  Current Outpatient Medications:    anastrozole (ARIMIDEX) 1 MG tablet, Take 1 tablet (1 mg total) by mouth daily., Disp: 30 tablet, Rfl: 6   Ascorbic Acid (VITAMIN C) 1000 MG tablet, Take 1,000 mg by mouth daily with lunch., Disp: , Rfl:    Calcium-Vitamin D (CALTRATE 600 PLUS-VIT D PO), Take 1 tablet by mouth daily with lunch. , Disp: , Rfl:    Cholecalciferol (D-3-5) 125 MCG (5000 UT) capsule, Take 5,000 Units by mouth daily., Disp: , Rfl:    cholecalciferol (VITAMIN D) 1000 UNITS tablet, Take 1,000 Units by mouth daily with lunch. , Disp: , Rfl:    Garlic (GARLIQUE PO), Take 1 tablet by mouth daily with lunch., Disp: , Rfl:    lidocaine-prilocaine (EMLA) cream, Apply a quarter sized amount to port a cath site and cover with plastic wrap 1 hour prior to infusion appointments, Disp: 30 g, Rfl: 3   losartan (COZAAR) 100 MG tablet, Take 100 mg by mouth daily., Disp: , Rfl:    NON FORMULARY, Balance of Nature - 3 fruits and 3 Vegetables, Disp: , Rfl:    Omega-3 Fatty Acids (FISH OIL) 1200 MG CPDR, Take by mouth., Disp: , Rfl:    PACLITAXEL IV, Inject into the vein once a week., Disp: , Rfl:  prochlorperazine (COMPAZINE) 10 MG tablet,  Take 1 tablet (10 mg total) by mouth every 6 (six) hours as needed for nausea or vomiting., Disp: 60 tablet, Rfl: 3   TRASTUZUMAB IV, Inject into the vein once a week. Weekly x 12 weeks, then every 21 days, Disp: , Rfl:    TURMERIC PO, Take 3 capsules by mouth daily with lunch., Disp: , Rfl:    vitamin B-12 (CYANOCOBALAMIN) 100 MCG tablet, Take 100 mcg by mouth daily., Disp: , Rfl:  No current facility-administered medications for this visit.  Facility-Administered Medications Ordered in Other Visits:    heparin lock flush 100 unit/mL, 500 Units, Intracatheter, Once PRN, Doreatha Massed, MD   sodium chloride flush (NS) 0.9 % injection 10 mL, 10 mL, Intracatheter, PRN, Doreatha Massed, MD, 10 mL at 02/07/23 1359   sodium chloride flush (NS) 0.9 % injection 10 mL, 10 mL, Intracatheter, PRN, Doreatha Massed, MD   trastuzumab-anns [facility]) 504 mg in sodium chloride 0.9 % 250 mL chemo infusion, 6 mg/kg (Treatment Plan Recorded), Intravenous, Once, Doreatha Massed, MD, Last Rate: 548 mL/hr at 05/01/23 1412, 504 mg at 05/01/23 1412   Allergies: Allergies  Allergen Reactions   Penicillins Other (See Comments)    "LOSS OF VISION"  Has patient had a PCN reaction causing immediate rash, facial/tongue/throat swelling, SOB or lightheadedness with hypotension: No Has patient had a PCN reaction causing severe rash involving mucus membranes or skin necrosis: No Has patient had a PCN reaction that required hospitalization: No Has patient had a PCN reaction occurring within the last 10 years: No If all of the above answers are "NO", then may proceed with Cephalosporin use.    Oxytetracycline Other (See Comments)    Fever blisters   Tamoxifen Other (See Comments)    "GENERAL ILLNESS WITH LIGHTHEADNESS"    Aromasin [Exemestane] Other (See Comments)    UNSPECIFIED REACTION  Pt is unsure of what reaction was...   Codeine Other (See Comments)    LIGHTHEADED    REVIEW OF SYSTEMS:    Review of Systems  Constitutional:  Negative for chills, fatigue and fever.  HENT:   Negative for lump/mass, mouth sores, nosebleeds, sore throat and trouble swallowing.   Eyes:  Negative for eye problems.  Respiratory:  Negative for cough and shortness of breath.   Cardiovascular:  Negative for chest pain, leg swelling and palpitations.  Gastrointestinal:  Negative for abdominal pain, constipation, diarrhea, nausea and vomiting.  Genitourinary:  Negative for bladder incontinence, difficulty urinating, dysuria, frequency, hematuria and nocturia.   Musculoskeletal:  Negative for arthralgias, back pain, flank pain, myalgias and neck pain.  Skin:  Negative for itching and rash.  Neurological:  Positive for numbness. Negative for dizziness and headaches.  Hematological:  Does not bruise/bleed easily.  Psychiatric/Behavioral:  Negative for depression, sleep disturbance and suicidal ideas. The patient is not nervous/anxious.   All other systems reviewed and are negative.    VITALS:   There were no vitals taken for this visit.  Wt Readings from Last 3 Encounters:  05/01/23 186 lb (84.4 kg)  04/11/23 182 lb 6.4 oz (82.7 kg)  03/20/23 184 lb (83.5 kg)    There is no height or weight on file to calculate BMI.  Performance status (ECOG): 1 - Symptomatic but completely ambulatory  PHYSICAL EXAM:   Physical Exam Vitals and nursing note reviewed. Exam conducted with a chaperone present.  Constitutional:      Appearance: Normal appearance.  Cardiovascular:     Rate and Rhythm:  Normal rate and regular rhythm.     Pulses: Normal pulses.     Heart sounds: Normal heart sounds.  Pulmonary:     Effort: Pulmonary effort is normal.     Breath sounds: Normal breath sounds.  Abdominal:     Palpations: Abdomen is soft. There is no hepatomegaly, splenomegaly or mass.     Tenderness: There is no abdominal tenderness.  Musculoskeletal:     Right lower leg: No edema.     Left lower leg: No edema.   Lymphadenopathy:     Cervical: No cervical adenopathy.     Right cervical: No superficial, deep or posterior cervical adenopathy.    Left cervical: No superficial, deep or posterior cervical adenopathy.     Upper Body:     Right upper body: No supraclavicular or axillary adenopathy.     Left upper body: No supraclavicular or axillary adenopathy.  Neurological:     General: No focal deficit present.     Mental Status: She is alert and oriented to person, place, and time.  Psychiatric:        Mood and Affect: Mood normal.        Behavior: Behavior normal.     LABS:      Latest Ref Rng & Units 05/01/2023   11:56 AM 04/11/2023   11:31 AM 03/20/2023   12:38 PM  CBC  WBC 4.0 - 10.5 K/uL 9.0  8.0  9.5   Hemoglobin 12.0 - 15.0 g/dL 54.0  98.1  19.1   Hematocrit 36.0 - 46.0 % 34.6  35.3  34.8   Platelets 150 - 400 K/uL 272  252  310       Latest Ref Rng & Units 05/01/2023   11:56 AM 04/11/2023   11:31 AM 03/20/2023   12:38 PM  CMP  Glucose 70 - 99 mg/dL 91  81  97   BUN 8 - 23 mg/dL 27  19  25    Creatinine 0.44 - 1.00 mg/dL 4.78  2.95  6.21   Sodium 135 - 145 mmol/L 137  135  139   Potassium 3.5 - 5.1 mmol/L 4.0  3.9  4.0   Chloride 98 - 111 mmol/L 103  101  105   CO2 22 - 32 mmol/L 27  22  28    Calcium 8.9 - 10.3 mg/dL 9.2  9.5  9.3   Total Protein 6.5 - 8.1 g/dL 6.1  6.3  6.3   Total Bilirubin 0.3 - 1.2 mg/dL 0.7  0.5  0.6   Alkaline Phos 38 - 126 U/L 59  60  57   AST 15 - 41 U/L 15  14  17    ALT 0 - 44 U/L 17  19  21       No results found for: "CEA1", "CEA" / No results found for: "CEA1", "CEA" No results found for: "PSA1" No results found for: "CAN199" No results found for: "CAN125"  Lab Results  Component Value Date   TOTALPROTELP 6.4 10/26/2022   ALBUMINELP 3.6 10/26/2022   A1GS 0.2 10/26/2022   A2GS 0.6 10/26/2022   BETS 1.1 10/26/2022   GAMS 0.9 10/26/2022   MSPIKE Not Observed 10/26/2022   SPEI Comment 10/26/2022   Lab Results  Component Value Date   TIBC  334 02/21/2023   TIBC 350 02/14/2023   FERRITIN 62 02/21/2023   FERRITIN 67 02/14/2023   IRONPCTSAT 10 (L) 02/21/2023   IRONPCTSAT 10 (L) 02/14/2023   Lab Results  Component Value Date  LDH 140 06/27/2011     STUDIES:   ECHOCARDIOGRAM COMPLETE  Result Date: 04/26/2023    ECHOCARDIOGRAM REPORT   Patient Name:   LEONE KING Date of Exam: 04/26/2023 Medical Rec #:  [id number]   Height:       64.0 in Accession #:    [id number]  Weight:       182.4 lb Date of Birth:  06-25-1946    BSA:          1.881 m Patient Age:    77 years    BP:           155/67 mmHg Patient Gender: F           HR:           87 bpm. [facility] Procedure: 2D Echo, Cardiac Doppler and Color Doppler Indications:    Chemo Z09  History:        Patient has prior history of Echocardiogram examinations, most                 recent 02/03/2023. Risk Factors:Hypertension and Former Smoker.                 Hx of COVID-19. Hx Breast cancer with left mastectomy.  Sonographer:    Celesta Gentile RCS Referring Phys: [phone] Doreatha Massed IMPRESSIONS  1. Left ventricular ejection fraction, by estimation, is 65 to 70%. The left ventricle has normal function. The left ventricle has no regional wall motion abnormalities. Left ventricular diastolic parameters are consistent with Grade I diastolic dysfunction (impaired relaxation).  2. Right ventricular systolic function is normal. The right ventricular size is normal. There is normal pulmonary artery systolic pressure.  3. The mitral valve is abnormal. Mild mitral valve regurgitation. No evidence of mitral stenosis.  4. The aortic valve is tricuspid. Aortic valve regurgitation is not visualized. No aortic stenosis is present. FINDINGS  Left Ventricle: Left ventricular ejection fraction, by estimation, is 65 to 70%. The left ventricle has normal function. The left ventricle has no regional wall motion abnormalities. The left ventricular internal cavity size was normal in size. There is   no left ventricular hypertrophy. Left ventricular diastolic parameters are consistent with Grade I diastolic dysfunction (impaired relaxation). Normal left ventricular filling pressure. Right Ventricle: The right ventricular size is normal. Right vetricular wall thickness was not well visualized. Right ventricular systolic function is normal. There is normal pulmonary artery systolic pressure. The tricuspid regurgitant velocity is 1.95 m/s, and with an assumed right atrial pressure of 8 mmHg, the estimated right ventricular systolic pressure is 23.2 mmHg. Left Atrium: Left atrial size was normal in size. Right Atrium: Right atrial size was normal in size. Pericardium: There is no evidence of pericardial effusion. Mitral Valve: The mitral valve is abnormal. Mild mitral valve regurgitation. No evidence of mitral valve stenosis. Tricuspid Valve: The tricuspid valve is normal in structure. Tricuspid valve regurgitation is mild . No evidence of tricuspid stenosis. Aortic Valve: The aortic valve is tricuspid. Aortic valve regurgitation is not visualized. No aortic stenosis is present. Aortic valve mean gradient measures 7.0 mmHg. Aortic valve peak gradient measures 13.5 mmHg. Aortic valve area, by VTI measures 1.79  cm. Pulmonic Valve: The pulmonic valve was not well visualized. Pulmonic valve regurgitation is not visualized. No evidence of pulmonic stenosis. Aorta: The aortic root is normal in size and structure. IAS/Shunts: No atrial level shunt detected by color flow Doppler.  LEFT VENTRICLE PLAX 2D LVIDd:  4.50 cm     Diastology LVIDs:         2.70 cm     LV e' medial:    7.62 cm/s LV PW:         0.90 cm     LV E/e' medial:  11.6 LV IVS:        0.90 cm     LV e' lateral:   6.31 cm/s LVOT diam:     1.70 cm     LV E/e' lateral: 14.1 LV SV:         68 LV SV Index:   36 LVOT Area:     2.27 cm  LV Volumes (MOD) LV vol d, MOD A2C: 72.4 ml LV vol d, MOD A4C: 81.2 ml LV vol s, MOD A2C: 27.2 ml LV vol s, MOD A4C: 30.2  ml LV SV MOD A2C:     45.2 ml LV SV MOD A4C:     81.2 ml LV SV MOD BP:      47.6 ml RIGHT VENTRICLE RV S prime:     13.10 cm/s TAPSE (M-mode): 2.1 cm LEFT ATRIUM             Index        RIGHT ATRIUM           Index LA diam:        3.60 cm 1.91 cm/m   RA Area:     15.00 cm LA Vol (A2C):   58.1 ml 30.88 ml/m  RA Volume:   38.80 ml  20.62 ml/m LA Vol (A4C):   51.4 ml 27.32 ml/m LA Biplane Vol: 55.6 ml 29.55 ml/m  AORTIC VALVE AV Area (Vmax):    1.69 cm AV Area (Vmean):   1.85 cm AV Area (VTI):     1.79 cm AV Vmax:           184.00 cm/s AV Vmean:          119.000 cm/s AV VTI:            0.379 m AV Peak Grad:      13.5 mmHg AV Mean Grad:      7.0 mmHg LVOT Vmax:         137.00 cm/s LVOT Vmean:        97.200 cm/s LVOT VTI:          0.299 m LVOT/AV VTI ratio: 0.79  AORTA Ao Root diam: 3.00 cm MITRAL VALVE                TRICUSPID VALVE MV Area (PHT): 2.95 cm     TR Peak grad:   15.2 mmHg MV Decel Time: 257 msec     TR Vmax:        195.00 cm/s MR Peak grad: 110.7 mmHg MR Mean grad: 70.0 mmHg     SHUNTS MR Vmax:      526.00 cm/s   Systemic VTI:  0.30 m MR Vmean:     392.0 cm/s    Systemic Diam: 1.70 cm MV E velocity: 88.70 cm/s MV A velocity: 107.00 cm/s MV E/A ratio:  0.83 Dina Rich MD Electronically signed by Dina Rich MD Signature Date/Time: 04/26/2023/11:42:40 AM    Final    DG Bone Density  Result Date: 04/26/2023 EXAM: DUAL X-RAY ABSORPTIOMETRY (DXA) FOR BONE MINERAL DENSITY IMPRESSION: Patient:             Haley Peterson, Haley Peterson Birth Date:  [birth year]    [age] Height / Weight:     64.0 in.    182.4 lbs. Sex / Ethnic:        Female    White Patient ID:          [id number] Referring Physician: Doreatha Massed Measured:            04/26/2023 9:49:17 AM (14.10) Analyzed:            04/26/2023 9:54:25 AM (14.10) L4 was excluded due to degenerative changes. Bilateral hips were excluded due to prior surgery. [facility] Sp[facility] Bone Density Trend BMD     Young-Adult Age-Matched Region (g/cm2) T-score      Z-score     WHO Classification L1     1.219   0.7         2.5         Normal L2     1.289   0.7         2.5         Normal L3     1.357   1.3         3.1         Normal L4     1.457   2.1         3.9         Normal L1-L3  1.291   1.0         2.8         Normal Trend: L1-L3 Change vs Change vs Measured   Age     BMD     Previous  Previous Date       (years) (g/cm2) (g/cm2)   (%) 04/26/2023 77.0    1.291   0.062*    5.0* 02/03/2021 74.7    1.229   0.075*    6.5* 06/06/2013 67.1    1.154   0.044*    4.0* 05/23/2011 65.0    1.110   0.000     0.0 04/28/2009 63.0    1.110   -0.010    -0.9 10/11/2006 60.4    1.120   -         - ANCILLARY RESULTS: [facility] Sp[facility] BMD Young-Adult Young-Adult Age-Matched Age-Matched [facility] Area Width Height Region (g/cm2) (%) T-score (%) Z-score (g) (cm2) (cm) (cm) L1 1.219 108 0.7 133 2.5 15.18 12.45 4.3 2.90 L2 1.289 107 0.7 131 2.5 18.05 14.00 4.1 3.40 L3 1.357 113 1.3 138 3.1 18.85 13.89 4.0 3.49 L4 1.457 121 2.1 148 3.9 21.93 15.05 4.5 3.35 L1-L2 1.256 108 0.8 132 2.5 33.23 26.45 4.2 6.30 L1-L3 1.291 110 1.0 135 2.8 52.08 40.34 4.1 9.79 L2-L3 1.323 110 1.0 134 2.8 36.90 27.89 4.0 6.89 Patient:             Haley Peterson, Haley Peterson Birth Date:          1946/01/02    [age] Height / Weight:     64.0 in.    182.4 lbs. Sex / Ethnic:        Female    White Patient ID:          [id number] Referring Physician: Doreatha Massed Measured:            04/26/2023 9:52:23 AM (14.10) Analyzed:            04/26/2023 9:55:17 AM (14.10) Left Forearm Bone Density Trend BMD     Young-Adult Age-Matched Region       (g/cm2) T-score  Z-score     WHO Classification Radius UD    0.302   -2.0        0.4          - Ulna UD      0.211   -           -            - Radius 33%   0.586   -1.8        0.6         Osteopenia Ulna 33%     0.562   -           -            - Both UD      0.267   -           -            - Both 33%     0.575   -           -            - Radius Total 0.467   -1.7        0.8          - Ulna Total   0.386    -           -            - Both Total   0.432   -           -            - Trend: Radius 33% Change vs Change vs Measured   Age     BMD     Previous  Previous Date       (years) (g/cm2) (g/cm2)   (%) 04/26/2023 77.0    0.586   0.006     1.1 02/03/2021 74.7    0.579   -         - Electronically Signed   By: Romona Curls M.D.   On: 04/26/2023 10:09

## 2023-05-01 ENCOUNTER — Inpatient Hospital Stay: Payer: PPO

## 2023-05-01 ENCOUNTER — Inpatient Hospital Stay: Payer: PPO | Attending: Hematology | Admitting: Hematology

## 2023-05-01 VITALS — BP 129/52 | HR 72 | Temp 97.9°F | Resp 18 | Wt 186.0 lb

## 2023-05-01 DIAGNOSIS — Z170 Estrogen receptor positive status [ER+]: Secondary | ICD-10-CM | POA: Diagnosis not present

## 2023-05-01 DIAGNOSIS — C50412 Malignant neoplasm of upper-outer quadrant of left female breast: Secondary | ICD-10-CM | POA: Diagnosis not present

## 2023-05-01 DIAGNOSIS — Z95828 Presence of other vascular implants and grafts: Secondary | ICD-10-CM

## 2023-05-01 DIAGNOSIS — Z5112 Encounter for antineoplastic immunotherapy: Secondary | ICD-10-CM | POA: Diagnosis not present

## 2023-05-01 DIAGNOSIS — Z79899 Other long term (current) drug therapy: Secondary | ICD-10-CM | POA: Insufficient documentation

## 2023-05-01 LAB — CBC WITH DIFFERENTIAL/PLATELET
Abs Immature Granulocytes: 0.02 10*3/uL (ref 0.00–0.07)
Basophils Absolute: 0 10*3/uL (ref 0.0–0.1)
Basophils Relative: 0 %
Eosinophils Absolute: 0.4 10*3/uL (ref 0.0–0.5)
Eosinophils Relative: 4 %
HCT: 34.6 % — ABNORMAL LOW (ref 36.0–46.0)
Hemoglobin: 11.3 g/dL — ABNORMAL LOW (ref 12.0–15.0)
Immature Granulocytes: 0 %
Lymphocytes Relative: 16 %
Lymphs Abs: 1.5 10*3/uL (ref 0.7–4.0)
MCH: 31.1 pg (ref 26.0–34.0)
MCHC: 32.7 g/dL (ref 30.0–36.0)
MCV: 95.3 fL (ref 80.0–100.0)
Monocytes Absolute: 0.9 10*3/uL (ref 0.1–1.0)
Monocytes Relative: 10 %
Neutro Abs: 6.2 10*3/uL (ref 1.7–7.7)
Neutrophils Relative %: 70 %
Platelets: 272 10*3/uL (ref 150–400)
RBC: 3.63 MIL/uL — ABNORMAL LOW (ref 3.87–5.11)
RDW: 12.8 % (ref 11.5–15.5)
WBC: 9 10*3/uL (ref 4.0–10.5)
nRBC: 0 % (ref 0.0–0.2)

## 2023-05-01 LAB — COMPREHENSIVE METABOLIC PANEL
ALT: 17 U/L (ref 0–44)
AST: 15 U/L (ref 15–41)
Albumin: 3.5 g/dL (ref 3.5–5.0)
Alkaline Phosphatase: 59 U/L (ref 38–126)
Anion gap: 7 (ref 5–15)
BUN: 27 mg/dL — ABNORMAL HIGH (ref 8–23)
CO2: 27 mmol/L (ref 22–32)
Calcium: 9.2 mg/dL (ref 8.9–10.3)
Chloride: 103 mmol/L (ref 98–111)
Creatinine, Ser: 1.26 mg/dL — ABNORMAL HIGH (ref 0.44–1.00)
GFR, Estimated: 44 mL/min — ABNORMAL LOW (ref >60)
Glucose, Bld: 91 mg/dL (ref 70–99)
Potassium: 4 mmol/L (ref 3.5–5.1)
Sodium: 137 mmol/L (ref 135–145)
Total Bilirubin: 0.7 mg/dL (ref 0.3–1.2)
Total Protein: 6.1 g/dL — ABNORMAL LOW (ref 6.5–8.1)

## 2023-05-01 LAB — MAGNESIUM: Magnesium: 2.1 mg/dL (ref 1.7–2.4)

## 2023-05-01 MED ORDER — SODIUM CHLORIDE 0.9% FLUSH
10.0000 mL | INTRAVENOUS | Status: DC | PRN
  Administered 2023-05-01: 10 mL

## 2023-05-01 MED ORDER — HEPARIN SOD (PORK) LOCK FLUSH 100 UNIT/ML IV SOLN
500.0000 [IU] | Freq: Once | INTRAVENOUS | Status: AC | PRN
  Administered 2023-05-01: 500 [IU]

## 2023-05-01 MED ORDER — SODIUM CHLORIDE 0.9 % IV SOLN: Freq: Once | INTRAVENOUS | Status: AC

## 2023-05-01 MED ORDER — SODIUM CHLORIDE 0.9% FLUSH
10.0000 mL | INTRAVENOUS | Status: DC | PRN
  Administered 2023-05-01: 10 mL via INTRAVENOUS

## 2023-05-01 MED ORDER — ACETAMINOPHEN 325 MG PO TABS
650.0000 mg | ORAL_TABLET | Freq: Once | ORAL | Status: AC
  Administered 2023-05-01: 650 mg via ORAL
  Filled 2023-05-01: qty 2

## 2023-05-01 MED ORDER — TRASTUZUMAB-ANNS CHEMO 150 MG IV SOLR
6.0000 mg/kg | Freq: Once | INTRAVENOUS | Status: AC
  Administered 2023-05-01: 504 mg via INTRAVENOUS
  Filled 2023-05-01: qty 24

## 2023-05-01 MED ORDER — CETIRIZINE HCL 10 MG/ML IV SOLN
10.0000 mg | Freq: Once | INTRAVENOUS | Status: AC
  Administered 2023-05-01: 10 mg via INTRAVENOUS
  Filled 2023-05-01: qty 1

## 2023-05-01 NOTE — Progress Notes (Signed)
Patient presents today for Kanjinti infusion per providers order.  Vital signs and labs reviewed by MD.  Message received from Kennith Gain RN/Dr. Ellin Saba, patient okay for treatment.

## 2023-05-01 NOTE — Patient Instructions (Signed)
[facility]  Discharge Instructions  You were seen and examined today by Dr. Ellin Saba.   Your labs are stable. Proceed with treatment as planned.  Follow-up as scheduled.  Thank you for choosing [facility] to provide your oncology and hematology care.   To afford each patient quality time with our provider, please arrive at least 15 minutes before your scheduled appointment time. You may need to reschedule your appointment if you arrive late (10 or more minutes). Arriving late affects you and other patients whose appointments are after yours.  Also, if you miss three or more appointments without notifying the office, you may be dismissed from the clinic at the provider's discretion.    Again, thank you for choosing [facility].  Our hope is that these requests will decrease the amount of time that you wait before being seen by our physicians.   If you have a lab appointment with the Cancer Center - please note that after April 8th, all labs will be drawn in the cancer center.  You do not have to check in or register with the main entrance as you have in the past but will complete your check-in at the cancer center.            _____________________________________________________________  Should you have questions after your visit to [facility], please contact our office at [phone] and follow the prompts.  Our office hours are 8:00 a.m. to 4:30 p.m. Monday - Thursday and 8:00 a.m. to 2:30 p.m. Friday.  Please note that voicemails left after 4:00 p.m. may not be returned until the following business day.  We are closed weekends and all major holidays.  You do have access to a nurse 24-7, just call the main number to the clinic [phone] and do not press any options, hold on the line and a nurse will answer the phone.    For prescription refill requests, have your pharmacy contact our office and allow 72  hours.    Masks are no longer required in the cancer centers. If you would like for your care team to wear a mask while they are taking care of you, please let them know. You may have one support person who is at least 77 years old accompany you for your appointments.

## 2023-05-01 NOTE — Progress Notes (Signed)
Patients port flushed without difficulty.  Good blood return noted with no bruising or swelling noted at site.  VSS. Patient remains accessed for treatment.  

## 2023-05-01 NOTE — Patient Instructions (Signed)
[facility]  Discharge Instructions: Thank you for choosing [facility] to provide your oncology and hematology care.  If you have a lab appointment with the Cancer Center - please note that after April 8th, 2024, all labs will be drawn in the cancer center.  You do not have to check in or register with the main entrance as you have in the past but will complete your check-in in the cancer center.  Wear comfortable clothing and clothing appropriate for easy access to any Portacath or PICC line.   We strive to give you quality time with your provider. You may need to reschedule your appointment if you arrive late (15 or more minutes).  Arriving late affects you and other patients whose appointments are after yours.  Also, if you miss three or more appointments without notifying the office, you may be dismissed from the clinic at the provider's discretion.      For prescription refill requests, have your pharmacy contact our office and allow 72 hours for refills to be completed.    Today you received the following chemotherapy and/or immunotherapy agents kanjinti      To help prevent nausea and vomiting after your treatment, we encourage you to take your nausea medication as directed.  BELOW ARE SYMPTOMS THAT SHOULD BE REPORTED IMMEDIATELY: *FEVER GREATER THAN 100.4 F (38 C) OR HIGHER *CHILLS OR SWEATING *NAUSEA AND VOMITING THAT IS NOT CONTROLLED WITH YOUR NAUSEA MEDICATION *UNUSUAL SHORTNESS OF BREATH *UNUSUAL BRUISING OR BLEEDING *URINARY PROBLEMS (pain or burning when urinating, or frequent urination) *BOWEL PROBLEMS (unusual diarrhea, constipation, pain near the anus) TENDERNESS IN MOUTH AND THROAT WITH OR WITHOUT PRESENCE OF ULCERS (sore throat, sores in mouth, or a toothache) UNUSUAL RASH, SWELLING OR PAIN  UNUSUAL VAGINAL DISCHARGE OR ITCHING   Items with * indicate a potential emergency and should be followed up as soon as possible or go to the  Emergency Department if any problems should occur.  Please show the CHEMOTHERAPY ALERT CARD or IMMUNOTHERAPY ALERT CARD at check-in to the Emergency Department and triage nurse.  Should you have questions after your visit or need to cancel or reschedule your appointment, please contact [facility] [phone]  and follow the prompts.  Office hours are 8:00 a.m. to 4:30 p.m. Monday - Friday. Please note that voicemails left after 4:00 p.m. may not be returned until the following business day.  We are closed weekends and major holidays. You have access to a nurse at all times for urgent questions. Please call the main number to the clinic [phone] and follow the prompts.  For any non-urgent questions, you may also contact your provider using MyChart. We now offer e-Visits for anyone 36 and older to request care online for non-urgent symptoms. For details visit [email or web address].   Also download the MyChart app! Go to the app store, search "MyChart", open the app, select [place], and log in with your MyChart username and password.

## 2023-05-01 NOTE — Progress Notes (Signed)
Patient has been assessed, vital signs and labs have been reviewed by Dr. Katragadda. ANC, Creatinine, LFTs, and Platelets are within treatment parameters per Dr. Katragadda. The patient is good to proceed with treatment at this time. Primary RN and pharmacy aware.  

## 2023-05-02 ENCOUNTER — Other Ambulatory Visit: Payer: Self-pay

## 2023-05-08 ENCOUNTER — Ambulatory Visit: Payer: PPO | Attending: General Surgery

## 2023-05-08 VITALS — Wt 185.0 lb

## 2023-05-08 DIAGNOSIS — Z483 Aftercare following surgery for neoplasm: Secondary | ICD-10-CM

## 2023-05-08 NOTE — Therapy (Signed)
OUTPATIENT PHYSICAL THERAPY SOZO SCREENING NOTE   Patient Name: Haley Peterson MRN: [id number] DOB:1945/12/04, 77 y.o., female Today's Date: 05/08/2023  PCP: Benita Stabile, MD REFERRING PROVIDER: Emelia Loron, MD   PT End of Session - 05/08/23 [phone]     Visit Number 2   # unchanged due to screen only   PT Start Time 0939    PT Stop Time 0943    PT Time Calculation (min) 4 min    Activity Tolerance Patient tolerated treatment well    Behavior During Therapy [facility] for tasks assessed/performed             Past Medical History:  Diagnosis Date   Arthritis    Bone spur    left heel   Breast cancer (HCC)    bilateral   Cancer (HCC)    Bilateral Breast cancer   Cataract    right eye    CKD (chronic kidney disease), stage III (HCC)    mgd by PCP    History of breast cancer    left, right   Hypertension    Personal history of radiation therapy    Pneumonia 10/2012   Port-A-Cath in place 12/06/2022   Past Surgical History:  Procedure Laterality Date   ABDOMINAL HYSTERECTOMY     BIOPSY  04/12/2017   Procedure: BIOPSY;  Surgeon: Malissa Hippo, MD;  Location: [facility];  Service: Endoscopy;;  ascending colon ulcer biopsies   BREAST BIOPSY Left 09/22/2022   [place] LT BREAST BX W LOC DEV EA ADD LESION IMG BX SPEC US GUIDE 09/22/2022 [facility]   BREAST BIOPSY Left 09/22/2022   [place] LT BREAST BX W LOC DEV 1ST LESION IMG BX SPEC US GUIDE 09/22/2022 [facility]   BREAST LUMPECTOMY  08/28/2001   left   BREAST RECONSTRUCTION Right    implant   CHOLECYSTECTOMY  2002   COLONOSCOPY     COLONOSCOPY N/A 04/12/2017   Procedure: COLONOSCOPY;  Surgeon: Malissa Hippo, MD;  Location: [facility];  Service: Endoscopy;  Laterality: N/A;  830   MASTECTOMY  08/15/2006   right   PARTIAL HYSTERECTOMY  1985   PORTACATH PLACEMENT Right 10/31/2022   Procedure: INSERTION PORT-A-CATH;  Surgeon: Emelia Loron, MD;  Location: [facility];  Service: General;   Laterality: Right;   REPLACEMENT TOTAL KNEE  2009, 2010   right-2010, left 2009   SIMPLE MASTECTOMY WITH AXILLARY SENTINEL NODE BIOPSY Left 10/31/2022   Procedure: LEFT MASTECTOMY;  Surgeon: Emelia Loron, MD;  Location: [facility];  Service: General;  Laterality: Left;  90 MIN ROOM 2   TISSUE EXPANDER PLACEMENT     TOTAL HIP ARTHROPLASTY Right 10/09/2017   Procedure: RIGHT TOTAL HIP ARTHROPLASTY ANTERIOR APPROACH;  Surgeon: Gean Birchwood, MD;  Location: [facility];  Service: Orthopedics;  Laterality: Right;   TOTAL HIP ARTHROPLASTY Left 09/23/2019   Procedure: Left Anterior Hip Arthroplasty;  Surgeon: Gean Birchwood, MD;  Location: [facility];  Service: Orthopedics;  Laterality: Left;   Patient Active Problem List   Diagnosis Date Noted   Iron deficiency anemia 02/21/2023   Port-A-Cath in place 12/06/2022   S/P left mastectomy 10/31/2022   Breast cancer of upper-outer quadrant of left female breast (HCC) 10/25/2022   S/P hip replacement, left 09/23/2019   Osteoarthritis of left hip 09/20/2019   Primary osteoarthritis of right hip 10/09/2017   Osteoarthritis of right hip 10/04/2017   Special screening for malignant neoplasms, colon 12/30/2016   Breast  cancer (HCC) 06/27/2011   Hx Breast cancer, Right, multifocal IDC, receptor +, Her2 - 06/07/2006    Class: Stage 1   Hx Breast cancer (DCIS), Right, Stage 0,  08/08/2001    REFERRING DIAG: left breast cancer at risk for lymphedema  THERAPY DIAG:  Aftercare following surgery for neoplasm  PERTINENT HISTORY: Patient was diagnosed on 10/13/22 with left breast cancer in 2 locations measuring around 2.5cm in total. Negative axillary [place]. grade 2 IDC. It is ER/PR/HER2 positive with a Ki67 of 20%.  Prior hx of Lt lumpectomy with radiation and SLNB with 1 negative lymph node removed in 2002 and Rt mastectomy with implant in 2007. Lt mastectomy 10/31/22 with 1 negative node removed.   PRECAUTIONS: left UE Lymphedema risk  SUBJECTIVE:  Pt returns for her 3 month L-Dex screen.   PAIN:  Are you having pain? No  SOZO SCREENING: Patient was assessed today using the SOZO machine to determine the lymphedema index score. This was compared to her baseline score. It was determined that she is within the recommended range when compared to her baseline and no further action is needed at this time. She will continue SOZO screenings. These are done every 3 months for 2 years post operatively followed by every 6 months for 2 years, and then annually.  P: SOZO Q85months until at least 10/31/24 and Q79months until 10/31/26   L-DEX FLOWSHEETS - 05/08/23 0900       L-DEX LYMPHEDEMA SCREENING   Measurement Type Unilateral    L-DEX MEASUREMENT EXTREMITY Upper Extremity    POSITION  Standing    DOMINANT SIDE Right    At Risk Side Left    BASELINE SCORE (UNILATERAL) 2.8    L-DEX SCORE (UNILATERAL) 6.3    VALUE CHANGE (UNILAT) 3.5              Hermenia Bers, PTA 05/08/2023, 9:42 AM

## 2023-05-09 ENCOUNTER — Other Ambulatory Visit: Payer: Self-pay

## 2023-05-30 ENCOUNTER — Other Ambulatory Visit: Payer: Self-pay

## 2023-05-30 DIAGNOSIS — Z170 Estrogen receptor positive status [ER+]: Secondary | ICD-10-CM

## 2023-05-31 ENCOUNTER — Inpatient Hospital Stay: Payer: PPO | Admitting: Hematology

## 2023-05-31 ENCOUNTER — Inpatient Hospital Stay: Payer: PPO

## 2023-05-31 ENCOUNTER — Inpatient Hospital Stay: Payer: PPO | Attending: Hematology

## 2023-05-31 VITALS — BP 128/50 | HR 77 | Temp 97.1°F | Resp 18 | Wt 187.2 lb

## 2023-05-31 DIAGNOSIS — C50412 Malignant neoplasm of upper-outer quadrant of left female breast: Secondary | ICD-10-CM | POA: Insufficient documentation

## 2023-05-31 DIAGNOSIS — Z95828 Presence of other vascular implants and grafts: Secondary | ICD-10-CM

## 2023-05-31 DIAGNOSIS — Z5112 Encounter for antineoplastic immunotherapy: Secondary | ICD-10-CM | POA: Diagnosis not present

## 2023-05-31 DIAGNOSIS — Z170 Estrogen receptor positive status [ER+]: Secondary | ICD-10-CM

## 2023-05-31 LAB — CBC WITH DIFFERENTIAL/PLATELET
Abs Immature Granulocytes: 0.02 10*3/uL (ref 0.00–0.07)
Basophils Absolute: 0.1 10*3/uL (ref 0.0–0.1)
Basophils Relative: 1 %
Eosinophils Absolute: 0.4 10*3/uL (ref 0.0–0.5)
Eosinophils Relative: 4 %
HCT: 37.9 % (ref 36.0–46.0)
Hemoglobin: 12.3 g/dL (ref 12.0–15.0)
Immature Granulocytes: 0 %
Lymphocytes Relative: 17 %
Lymphs Abs: 1.7 10*3/uL (ref 0.7–4.0)
MCH: 30.3 pg (ref 26.0–34.0)
MCHC: 32.5 g/dL (ref 30.0–36.0)
MCV: 93.3 fL (ref 80.0–100.0)
Monocytes Absolute: 0.8 10*3/uL (ref 0.1–1.0)
Monocytes Relative: 8 %
Neutro Abs: 6.9 10*3/uL (ref 1.7–7.7)
Neutrophils Relative %: 70 %
Platelets: 317 10*3/uL (ref 150–400)
RBC: 4.06 MIL/uL (ref 3.87–5.11)
RDW: 12.2 % (ref 11.5–15.5)
WBC: 10 10*3/uL (ref 4.0–10.5)
nRBC: 0 % (ref 0.0–0.2)

## 2023-05-31 LAB — COMPREHENSIVE METABOLIC PANEL
ALT: 21 U/L (ref 0–44)
AST: 16 U/L (ref 15–41)
Albumin: 3.6 g/dL (ref 3.5–5.0)
Alkaline Phosphatase: 63 U/L (ref 38–126)
Anion gap: 5 (ref 5–15)
BUN: 30 mg/dL — ABNORMAL HIGH (ref 8–23)
CO2: 27 mmol/L (ref 22–32)
Calcium: 9.6 mg/dL (ref 8.9–10.3)
Chloride: 105 mmol/L (ref 98–111)
Creatinine, Ser: 1.24 mg/dL — ABNORMAL HIGH (ref 0.44–1.00)
GFR, Estimated: 45 mL/min — ABNORMAL LOW (ref >60)
Glucose, Bld: 90 mg/dL (ref 70–99)
Potassium: 3.9 mmol/L (ref 3.5–5.1)
Sodium: 137 mmol/L (ref 135–145)
Total Bilirubin: 0.6 mg/dL (ref 0.3–1.2)
Total Protein: 6.7 g/dL (ref 6.5–8.1)

## 2023-05-31 LAB — MAGNESIUM: Magnesium: 2.2 mg/dL (ref 1.7–2.4)

## 2023-05-31 MED ORDER — SODIUM CHLORIDE 0.9% FLUSH
10.0000 mL | INTRAVENOUS | Status: DC | PRN
  Administered 2023-05-31: 10 mL

## 2023-05-31 MED ORDER — TRASTUZUMAB-ANNS CHEMO 150 MG IV SOLR
6.0000 mg/kg | Freq: Once | INTRAVENOUS | Status: AC
  Administered 2023-05-31: 504 mg via INTRAVENOUS
  Filled 2023-05-31: qty 24

## 2023-05-31 MED ORDER — SODIUM CHLORIDE 0.9 % IV SOLN: Freq: Once | INTRAVENOUS | Status: AC

## 2023-05-31 MED ORDER — HEPARIN SOD (PORK) LOCK FLUSH 100 UNIT/ML IV SOLN
500.0000 [IU] | Freq: Once | INTRAVENOUS | Status: AC | PRN
  Administered 2023-05-31: 500 [IU]

## 2023-05-31 MED ORDER — CETIRIZINE HCL 10 MG/ML IV SOLN
10.0000 mg | Freq: Once | INTRAVENOUS | Status: AC
  Administered 2023-05-31: 10 mg via INTRAVENOUS
  Filled 2023-05-31: qty 1

## 2023-05-31 MED ORDER — ACETAMINOPHEN 325 MG PO TABS
650.0000 mg | ORAL_TABLET | Freq: Once | ORAL | Status: AC
  Administered 2023-05-31: 650 mg via ORAL
  Filled 2023-05-31: qty 2

## 2023-05-31 NOTE — Progress Notes (Signed)
Patient presents today for Kanjinti infusion.  Patient is in satisfactory condition with no new complaints voiced.  Vital signs are stable.  Labs reviewed and all labs are within treatment parameters.  We will proceed with treatment per MD orders.    Patient tolerated treatment well with no complaints voiced.  Patient left ambulatory in stable condition.  Vital signs stable at discharge.  Follow up as scheduled.

## 2023-05-31 NOTE — Patient Instructions (Signed)
[facility] [place]  Discharge Instructions: Thank you for choosing [facility] to provide your oncology and hematology care.  If you have a lab appointment with the Cancer Center - please note that after April 8th, 2024, all labs will be drawn in the cancer center.  You do not have to check in or register with the main entrance as you have in the past but will complete your check-in in the cancer center.  Wear comfortable clothing and clothing appropriate for easy access to any Portacath or PICC line.   We strive to give you quality time with your provider. You may need to reschedule your appointment if you arrive late (15 or more minutes).  Arriving late affects you and other patients whose appointments are after yours.  Also, if you miss three or more appointments without notifying the office, you may be dismissed from the clinic at the provider's discretion.      For prescription refill requests, have your pharmacy contact our office and allow 72 hours for refills to be completed.    Today you received the following chemotherapy and/or immunotherapy agents Kanjinti.  Trastuzumab Injection What is this medication? TRASTUZUMAB (tras TOO zoo mab) treats breast cancer and stomach cancer. It works by blocking a protein that causes cancer cells to grow and multiply. This helps to slow or stop the spread of cancer cells. This medicine may be used for other purposes; ask your health care provider or pharmacist if you have questions. COMMON BRAND NAME(S): Herceptin, Marlowe Alt, Ontruzant, Trazimera What should I tell my care team before I take this medication? They need to know if you have any of these conditions: Heart failure Lung disease An unusual or allergic reaction to trastuzumab, other medications, foods, dyes, or preservatives Pregnant or trying to get pregnant Breast-feeding How should I use this medication? This medication is injected into a  vein. It is given by your care team in a hospital or clinic setting. Talk to your care team about the use of this medication in children. It is not approved for use in children. Overdosage: If you think you have taken too much of this medicine contact a poison control center or emergency room at once. NOTE: This medicine is only for you. Do not share this medicine with others. What if I miss a dose? Keep appointments for follow-up doses. It is important not to miss your dose. Call your care team if you are unable to keep an appointment. What may interact with this medication? Certain types of chemotherapy, such as daunorubicin, doxorubicin, epirubicin, idarubicin This list may not describe all possible interactions. Give your health care provider a list of all the medicines, herbs, non-prescription drugs, or dietary supplements you use. Also tell them if you smoke, drink alcohol, or use illegal drugs. Some items may interact with your medicine. What should I watch for while using this medication? Your condition will be monitored carefully while you are receiving this medication. This medication may make you feel generally unwell. This is not uncommon, as chemotherapy affects healthy cells as well as cancer cells. Report any side effects. Continue your course of treatment even though you feel ill unless your care team tells you to stop. This medication may increase your risk of getting an infection. Call your care team for advice if you get a fever, chills, sore throat, or other symptoms of a cold or flu. Do not treat yourself. Try to avoid being around people who  are sick. Avoid taking medications that contain aspirin, acetaminophen, ibuprofen, naproxen, or ketoprofen unless instructed by your care team. These medications can hide a fever. Talk to your care team if you may be pregnant. Serious birth defects can occur if you take this medication during pregnancy and for 7 months after the last dose. You  will need a negative pregnancy test before starting this medication. Contraception is recommended while taking this medication and for 7 months after the last dose. Your care team can help you find the option that works for you. Do not breastfeed while taking this medication and for 7 months after stopping treatment. What side effects may I notice from receiving this medication? Side effects that you should report to your care team as soon as possible: Allergic reactions or angioedema--skin rash, itching or hives, swelling of the face, eyes, lips, tongue, arms, or legs, trouble swallowing or breathing Dry cough, shortness of breath or trouble breathing Heart failure--shortness of breath, swelling of the ankles, feet, or hands, sudden weight gain, unusual weakness or fatigue Infection--fever, chills, cough, or sore throat Infusion reactions--chest pain, shortness of breath or trouble breathing, feeling faint or lightheaded Side effects that usually do not require medical attention (report to your care team if they continue or are bothersome): Diarrhea Dizziness Headache Nausea Trouble sleeping Vomiting This list may not describe all possible side effects. Call your doctor for medical advice about side effects. You may report side effects to FDA at 1-800-FDA-1088. Where should I keep my medication? This medication is given in a hospital or clinic. It will not be stored at home. NOTE: This sheet is a summary. It may not cover all possible information. If you have questions about this medicine, talk to your doctor, pharmacist, or health care provider.  2024 Elsevier/Gold Standard (2022-03-15 00:00:00)        To help prevent nausea and vomiting after your treatment, we encourage you to take your nausea medication as directed.  BELOW ARE SYMPTOMS THAT SHOULD BE REPORTED IMMEDIATELY: *FEVER GREATER THAN 100.4 F (38 C) OR HIGHER *CHILLS OR SWEATING *NAUSEA AND VOMITING THAT IS NOT CONTROLLED  WITH YOUR NAUSEA MEDICATION *UNUSUAL SHORTNESS OF BREATH *UNUSUAL BRUISING OR BLEEDING *URINARY PROBLEMS (pain or burning when urinating, or frequent urination) *BOWEL PROBLEMS (unusual diarrhea, constipation, pain near the anus) TENDERNESS IN MOUTH AND THROAT WITH OR WITHOUT PRESENCE OF ULCERS (sore throat, sores in mouth, or a toothache) UNUSUAL RASH, SWELLING OR PAIN  UNUSUAL VAGINAL DISCHARGE OR ITCHING   Items with * indicate a potential emergency and should be followed up as soon as possible or go to the Emergency Department if any problems should occur.  Please show the CHEMOTHERAPY ALERT CARD or IMMUNOTHERAPY ALERT CARD at check-in to the Emergency Department and triage nurse.  Should you have questions after your visit or need to cancel or reschedule your appointment, please contact [facility] [phone]  and follow the prompts.  Office hours are 8:00 a.m. to 4:30 p.m. Monday - Friday. Please note that voicemails left after 4:00 p.m. may not be returned until the following business day.  We are closed weekends and major holidays. You have access to a nurse at all times for urgent questions. Please call the main number to the clinic [phone] and follow the prompts.  For any non-urgent questions, you may also contact your provider using MyChart. We now offer e-Visits for anyone 73 and older to request care online for non-urgent symptoms. For details visit [email or web address].  Also download the MyChart app! Go to the app store, search "MyChart", open the app, select [place], and log in with your MyChart username and password.

## 2023-06-01 ENCOUNTER — Other Ambulatory Visit: Payer: Self-pay

## 2023-06-01 DIAGNOSIS — Z95828 Presence of other vascular implants and grafts: Secondary | ICD-10-CM

## 2023-06-01 DIAGNOSIS — Z170 Estrogen receptor positive status [ER+]: Secondary | ICD-10-CM

## 2023-06-12 ENCOUNTER — Inpatient Hospital Stay: Payer: PPO

## 2023-06-14 ENCOUNTER — Other Ambulatory Visit: Payer: Self-pay

## 2023-06-14 DIAGNOSIS — Z170 Estrogen receptor positive status [ER+]: Secondary | ICD-10-CM

## 2023-06-20 ENCOUNTER — Inpatient Hospital Stay: Payer: PPO | Attending: Hematology

## 2023-06-20 ENCOUNTER — Inpatient Hospital Stay: Payer: PPO

## 2023-06-20 VITALS — BP 129/40 | HR 77 | Temp 97.7°F | Resp 18

## 2023-06-20 DIAGNOSIS — Z170 Estrogen receptor positive status [ER+]: Secondary | ICD-10-CM | POA: Diagnosis not present

## 2023-06-20 DIAGNOSIS — Z95828 Presence of other vascular implants and grafts: Secondary | ICD-10-CM

## 2023-06-20 DIAGNOSIS — C50412 Malignant neoplasm of upper-outer quadrant of left female breast: Secondary | ICD-10-CM | POA: Diagnosis not present

## 2023-06-20 DIAGNOSIS — Z5112 Encounter for antineoplastic immunotherapy: Secondary | ICD-10-CM | POA: Diagnosis not present

## 2023-06-20 LAB — CBC WITH DIFFERENTIAL/PLATELET
Abs Immature Granulocytes: 0.01 10*3/uL (ref 0.00–0.07)
Basophils Absolute: 0 10*3/uL (ref 0.0–0.1)
Basophils Relative: 1 %
Eosinophils Absolute: 0.6 10*3/uL — ABNORMAL HIGH (ref 0.0–0.5)
Eosinophils Relative: 8 %
HCT: 38.1 % (ref 36.0–46.0)
Hemoglobin: 12.4 g/dL (ref 12.0–15.0)
Immature Granulocytes: 0 %
Lymphocytes Relative: 21 %
Lymphs Abs: 1.6 10*3/uL (ref 0.7–4.0)
MCH: 30.1 pg (ref 26.0–34.0)
MCHC: 32.5 g/dL (ref 30.0–36.0)
MCV: 92.5 fL (ref 80.0–100.0)
Monocytes Absolute: 0.7 10*3/uL (ref 0.1–1.0)
Monocytes Relative: 9 %
Neutro Abs: 4.5 10*3/uL (ref 1.7–7.7)
Neutrophils Relative %: 61 %
Platelets: 264 10*3/uL (ref 150–400)
RBC: 4.12 MIL/uL (ref 3.87–5.11)
RDW: 12.2 % (ref 11.5–15.5)
WBC: 7.3 10*3/uL (ref 4.0–10.5)
nRBC: 0 % (ref 0.0–0.2)

## 2023-06-20 LAB — COMPREHENSIVE METABOLIC PANEL
ALT: 18 U/L (ref 0–44)
AST: 17 U/L (ref 15–41)
Albumin: 3.6 g/dL (ref 3.5–5.0)
Alkaline Phosphatase: 64 U/L (ref 38–126)
Anion gap: 9 (ref 5–15)
BUN: 22 mg/dL (ref 8–23)
CO2: 27 mmol/L (ref 22–32)
Calcium: 9.2 mg/dL (ref 8.9–10.3)
Chloride: 101 mmol/L (ref 98–111)
Creatinine, Ser: 1.11 mg/dL — ABNORMAL HIGH (ref 0.44–1.00)
GFR, Estimated: 51 mL/min — ABNORMAL LOW (ref >60)
Glucose, Bld: 98 mg/dL (ref 70–99)
Potassium: 4 mmol/L (ref 3.5–5.1)
Sodium: 137 mmol/L (ref 135–145)
Total Bilirubin: 0.6 mg/dL (ref 0.3–1.2)
Total Protein: 6.6 g/dL (ref 6.5–8.1)

## 2023-06-20 LAB — MAGNESIUM: Magnesium: 2.2 mg/dL (ref 1.7–2.4)

## 2023-06-20 MED ORDER — SODIUM CHLORIDE 0.9% FLUSH
10.0000 mL | Freq: Once | INTRAVENOUS | Status: AC
  Administered 2023-06-20: 10 mL via INTRAVENOUS

## 2023-06-20 MED ORDER — SODIUM CHLORIDE 0.9% FLUSH: 10.0000 mL | INTRAVENOUS | Status: DC | PRN

## 2023-06-20 MED ORDER — HEPARIN SOD (PORK) LOCK FLUSH 100 UNIT/ML IV SOLN: 500.0000 [IU] | Freq: Once | INTRAVENOUS | Status: DC | PRN

## 2023-06-20 MED ORDER — TRASTUZUMAB-ANNS CHEMO 150 MG IV SOLR
6.0000 mg/kg | Freq: Once | INTRAVENOUS | Status: AC
  Administered 2023-06-20: 504 mg via INTRAVENOUS
  Filled 2023-06-20: qty 24

## 2023-06-20 MED ORDER — CETIRIZINE HCL 10 MG/ML IV SOLN
10.0000 mg | Freq: Once | INTRAVENOUS | Status: AC
  Administered 2023-06-20: 10 mg via INTRAVENOUS
  Filled 2023-06-20: qty 1

## 2023-06-20 MED ORDER — SODIUM CHLORIDE 0.9 % IV SOLN: Freq: Once | INTRAVENOUS | Status: AC

## 2023-06-20 MED ORDER — ACETAMINOPHEN 325 MG PO TABS
650.0000 mg | ORAL_TABLET | Freq: Once | ORAL | Status: AC
  Administered 2023-06-20: 650 mg via ORAL
  Filled 2023-06-20: qty 2

## 2023-06-20 NOTE — Progress Notes (Signed)
Patient tolerated chemotherapy with no complaints voiced.  Side effects with management reviewed with understanding verbalized.  Port site clean and dry with no bruising or swelling noted at site.  Good blood return noted before and after administration of chemotherapy.  Band aid applied.  Patient left in satisfactory condition with VSS and no s/s of distress noted.   

## 2023-06-20 NOTE — Patient Instructions (Signed)
[facility]  Discharge Instructions: Thank you for choosing [facility] to provide your oncology and hematology care.  If you have a lab appointment with the Cancer Center - please note that after April 8th, 2024, all labs will be drawn in the cancer center.  You do not have to check in or register with the main entrance as you have in the past but will complete your check-in in the cancer center.  Wear comfortable clothing and clothing appropriate for easy access to any Portacath or PICC line.   We strive to give you quality time with your provider. You may need to reschedule your appointment if you arrive late (15 or more minutes).  Arriving late affects you and other patients whose appointments are after yours.  Also, if you miss three or more appointments without notifying the office, you may be dismissed from the clinic at the provider's discretion.      For prescription refill requests, have your pharmacy contact our office and allow 72 hours for refills to be completed.    Today you received the following chemotherapy and/or immunotherapy agents Kanjinti.       To help prevent nausea and vomiting after your treatment, we encourage you to take your nausea medication as directed.  BELOW ARE SYMPTOMS THAT SHOULD BE REPORTED IMMEDIATELY: *FEVER GREATER THAN 100.4 F (38 C) OR HIGHER *CHILLS OR SWEATING *NAUSEA AND VOMITING THAT IS NOT CONTROLLED WITH YOUR NAUSEA MEDICATION *UNUSUAL SHORTNESS OF BREATH *UNUSUAL BRUISING OR BLEEDING *URINARY PROBLEMS (pain or burning when urinating, or frequent urination) *BOWEL PROBLEMS (unusual diarrhea, constipation, pain near the anus) TENDERNESS IN MOUTH AND THROAT WITH OR WITHOUT PRESENCE OF ULCERS (sore throat, sores in mouth, or a toothache) UNUSUAL RASH, SWELLING OR PAIN  UNUSUAL VAGINAL DISCHARGE OR ITCHING   Items with * indicate a potential emergency and should be followed up as soon as possible or go to the  Emergency Department if any problems should occur.  Please show the CHEMOTHERAPY ALERT CARD or IMMUNOTHERAPY ALERT CARD at check-in to the Emergency Department and triage nurse.  Should you have questions after your visit or need to cancel or reschedule your appointment, please contact [facility] [phone]  and follow the prompts.  Office hours are 8:00 a.m. to 4:30 p.m. Monday - Friday. Please note that voicemails left after 4:00 p.m. may not be returned until the following business day.  We are closed weekends and major holidays. You have access to a nurse at all times for urgent questions. Please call the main number to the clinic [phone] and follow the prompts.  For any non-urgent questions, you may also contact your provider using MyChart. We now offer e-Visits for anyone 13 and older to request care online for non-urgent symptoms. For details visit [email or web address].   Also download the MyChart app! Go to the app store, search "MyChart", open the app, select [place], and log in with your MyChart username and password.

## 2023-06-20 NOTE — Progress Notes (Signed)
Patients port flushed without difficulty.  Good blood return noted with no bruising or swelling noted at site.  Patient to remain accessed for treatment.  

## 2023-07-03 ENCOUNTER — Inpatient Hospital Stay: Payer: PPO

## 2023-07-03 ENCOUNTER — Inpatient Hospital Stay: Payer: PPO | Admitting: Hematology

## 2023-07-11 ENCOUNTER — Other Ambulatory Visit: Payer: Self-pay

## 2023-07-11 NOTE — Progress Notes (Signed)
[facility] [address], [place] [address]    Clinic Day:  07/12/2023  Referring physician: Benita Stabile, MD  Patient Care Team: Benita Stabile, MD as PCP - General (Internal Medicine) Patrica Duel, MD (Inactive) (Internal Medicine) Cyndia Bent, MD (Inactive) as Surgeon (General Surgery) Doreatha Massed, MD as Medical Oncologist (Medical Oncology) Therese Sarah, RN as Oncology Nurse Navigator (Medical Oncology)   ASSESSMENT & PLAN:   Assessment: 1.  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  In the 2:00 location 3 cm from the nipple, mass measures 0.8 x 0.9 x 0.9 cm.  Measured together, these lesions span 2.4 cm in the 2 o'clock position.  Left axilla is negative for adenopathy. - Biopsy (09/22/2022): 1. left breast 2:00, 1 cmfn-invasive ductal carcinoma, grade 2, ER 100%, PR 95%, Ki-67 20%, HER2 2+ by IHC, HER2 FISH positive with ratio of 2.45 2.  Left breast 2:00 3 cmfn-invasive ductal carcinoma, grade 2, ER/PR 100%, Ki-67 15%, HER2 2+ by IHC, FISH negative - Left mastectomy, SLNB by Dr. Dwain Sarna on 10/31/2022 - Pathology: 2.5 x 2.1 x 1.2 cm invasive moderately differentiated adenocarcinoma, grade 2, margins free, extensive lymphatic invasion.  0/1 lymph node positive.  Associated focal intermediate grade DCIS. - Adjuvant chemotherapy with weekly paclitaxel and Herceptin started on 12/13/2022, 12 cycles of Taxol completed on 02/28/2023.  With Herceptin every 3 weeks ongoing - Anastrozole started on 03/20/2023.   2.  Social/family history: - Lives at home by herself and is independent of ADLs and IADLs.  She worked as an Software engineer.  Quit smoking 40 years ago. - Brother had thyroid cancer.   3.  Stage I  right breast cancer: - Diagnosed on 05/15/2006, right breast 11 o'clock position - Right mastectomy (08/15/2006): 1.3 cm IDC, ER 83% positive, PR 84% positive, HER2 negative, Ki-67 4% - Tamoxifen followed by exemestane followed by letrozole for 10 years.   4.  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  Osteopenia: - Last bone density on 02/03/2021: T-score -1.9. - DEXA scan (04/26/2023) T-score -2.0    Plan: 1.  Stage Ia (T2 N0) HER2 positive left breast cancer: - She is tolerating anastrozole reasonably well with occasional hot flashes.  No arthralgias. - She is tolerating Herceptin well.  Denies any PND or orthopnea.  She reported dry mouth and uses Biotene which helps. - Labs today: Normal LFTs.  Creatinine 1.19 stable.  CBC grossly normal. - Proceed with Herceptin every 3 weeks.  RTC 12 weeks for follow-up for toxicity assessment.   2.  High risk drug monitoring: - Reviewed echo from 04/26/2023: EF 65 to 70%, stable. - Repeat echocardiogram in mid September.   3.  Peripheral neuropathy: - Tingling in the lower legs and feet from paclitaxel is stable.  No pharmacological intervention necessary.   4.  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 - Last Feraheme infusion on 03/20/2023.  Hemoglobin today is 11.8.  5.  Osteopenia: - DEXA scan on 04/26/2023 with T-score -2.0, osteopenia, stable from 2022. - Continue calcium and vitamin D supplements.  Will check vitamin D at next visit.    Orders Placed This Encounter  Procedures  ECHOCARDIOGRAM COMPLETE    Standing Status:   Future    Standing Expiration Date:   07/11/2024    Order Specific Question:   Where should this test be performed    Answer:   [facility]    Order Specific Question:   Perflutren DEFINITY (image enhancing agent) should be administered unless hypersensitivity or allergy exist    Answer:   Administer Perflutren    Order Specific Question:   Is a  special reader required? (athlete or structural heart)    Answer:   No    Order Specific Question:   Does this study need to be read by the Structural team/Level 3 readers?    Answer:   No    Order Specific Question:   Reason for exam-Echo    Answer:   Chemo  Z09      I,Katie Daubenspeck,acting as a scribe for Doreatha Massed, MD.,have documented all relevant documentation on the behalf of Doreatha Massed, MD,as directed by  Doreatha Massed, MD while in the presence of Doreatha Massed, MD.   I, Doreatha Massed MD, have reviewed the above documentation for accuracy and completeness, and I agree with the above. Can believe  Doreatha Massed, MD   8/28/20244:48 PM  CHIEF COMPLAINT:   Diagnosis: stage Ib HER2 positive left breast cancer    Cancer Staging  Breast cancer of upper-outer quadrant of left female breast [place]) Staging form: Breast, AJCC 8th Edition - Clinical stage from 10/25/2022: Stage IB (cT2, cN0, cM0, G2, ER+, PR+, HER2+) - Unsigned    Prior Therapy: 1. Left mastectomy and SLNB by Dr. Dwain Sarna on 10/31/2022  2. Trastuzumab and Herceptin 12/13/22 - 02/28/23  Current Therapy:  Herceptin and anastrozole    HISTORY OF PRESENT ILLNESS:   Oncology History  Breast cancer of upper-outer quadrant of left female breast (HCC)  10/25/2022 Initial Diagnosis   Breast cancer of upper-outer quadrant of left female breast (HCC)   12/13/2022 -  Chemotherapy   Patient is on Treatment Plan : BREAST Paclitaxel + Trastuzumab q7d / Trastuzumab q21d        INTERVAL HISTORY:   Haley Peterson is a 77 y.o. female presenting to clinic today for follow up of stage Ib HER2 positive left breast cancer. She was last seen by me on 05/01/23.  Today, she states that she is doing well overall. Her appetite level is at 100%. Her energy level is at 100%.  PAST MEDICAL HISTORY:   Past Medical History: Past Medical History:  Diagnosis Date   Arthritis    Bone spur    left heel    Breast cancer (HCC)    bilateral   Cancer (HCC)    Bilateral Breast cancer   Cataract    right eye    CKD (chronic kidney disease), stage III (HCC)    mgd by PCP    History of breast cancer    left, right   Hypertension    Personal history of radiation therapy    Pneumonia 10/2012   Port-A-Cath in place 12/06/2022    Surgical History: Past Surgical History:  Procedure Laterality Date   ABDOMINAL HYSTERECTOMY     BIOPSY  04/12/2017   Procedure: BIOPSY;  Surgeon: Malissa Hippo, MD;  Location: [facility];  Service: Endoscopy;;  ascending colon ulcer biopsies   BREAST BIOPSY Left 09/22/2022   [place] LT BREAST BX W LOC DEV EA ADD LESION IMG BX SPEC US GUIDE 09/22/2022 [facility]   BREAST BIOPSY Left 09/22/2022  [place] LT BREAST BX W LOC DEV 1ST LESION IMG BX SPEC US GUIDE 09/22/2022 [facility]   BREAST LUMPECTOMY  08/28/2001   left   BREAST RECONSTRUCTION Right    implant   CHOLECYSTECTOMY  2002   COLONOSCOPY     COLONOSCOPY N/A 04/12/2017   Procedure: COLONOSCOPY;  Surgeon: Malissa Hippo, MD;  Location: [facility];  Service: Endoscopy;  Laterality: N/A;  830   MASTECTOMY  08/15/2006   right   PARTIAL HYSTERECTOMY  1985   PORTACATH PLACEMENT Right 10/31/2022   Procedure: INSERTION PORT-A-CATH;  Surgeon: Emelia Loron, MD;  Location: [facility];  Service: General;  Laterality: Right;   REPLACEMENT TOTAL KNEE  2009, 2010   right-2010, left 2009   SIMPLE MASTECTOMY WITH AXILLARY SENTINEL NODE BIOPSY Left 10/31/2022   Procedure: LEFT MASTECTOMY;  Surgeon: Emelia Loron, MD;  Location: [facility];  Service: General;  Laterality: Left;  90 MIN ROOM 2   TISSUE EXPANDER PLACEMENT     TOTAL HIP ARTHROPLASTY Right 10/09/2017   Procedure: RIGHT TOTAL HIP ARTHROPLASTY ANTERIOR APPROACH;  Surgeon: Gean Birchwood, MD;  Location: [facility];  Service: Orthopedics;  Laterality: Right;   TOTAL HIP ARTHROPLASTY Left 09/23/2019   Procedure:  Left Anterior Hip Arthroplasty;  Surgeon: Gean Birchwood, MD;  Location: [facility];  Service: Orthopedics;  Laterality: Left;    Social History: Social History   Socioeconomic History   Marital status: Divorced    Spouse name: Not on file   Number of children: Not on file   Years of education: Not on file   Highest education level: Not on file  Occupational History   Not on file  Tobacco Use   Smoking status: Former    Current packs/day: 0.00    Average packs/day: 0.5 packs/day for 5.0 years (2.5 ttl pk-yrs)    Types: Cigarettes    Start date: 09/27/1978    Quit date: 09/28/1983    Years since quitting: 39.8   Smokeless tobacco: Never  Vaping Use   Vaping status: Never Used  Substance and Sexual Activity   Alcohol use: No   Drug use: No   Sexual activity: Not on file  Other Topics Concern   Not on file  Social History Narrative   Not on file   Social Determinants of Health   Financial Resource Strain: Not on file  Food Insecurity: Not on file  Transportation Needs: Not on file  Physical Activity: Not on file  Stress: Not on file  Social Connections: Not on file  Intimate Partner Violence: Not on file    Family History: Family History  Problem Relation Age of Onset   Arthritis Mother    Thyroid cancer Brother        2010   Colon cancer Neg Hx    Breast cancer Neg Hx     Current Medications:  Current Outpatient Medications:    anastrozole (ARIMIDEX) 1 MG tablet, Take 1 tablet (1 mg total) by mouth daily., Disp: 30 tablet, Rfl: 6   Ascorbic Acid (VITAMIN C) 1000 MG tablet, Take 1,000 mg by mouth daily with lunch., Disp: , Rfl:    Calcium-Vitamin D (CALTRATE 600 PLUS-VIT D PO), Take 1 tablet by mouth daily with lunch. , Disp: , Rfl:    chlorhexidine (PERIDEX) 0.12 % solution, Rinse 1/2 oz. twiceYdaily, after breakfast and before bedtime, Disp: , Rfl:    Cholecalciferol (D-3-5) 125 MCG (5000 UT) capsule, Take 5,000 Units by mouth daily., Disp: ,  Rfl:     cholecalciferol (VITAMIN D) 1000 UNITS tablet, Take 1,000 Units by mouth daily with lunch. , Disp: , Rfl:    Garlic (GARLIQUE PO), Take 1 tablet by mouth daily with lunch., Disp: , Rfl:    losartan (COZAAR) 100 MG tablet, Take 100 mg by mouth daily., Disp: , Rfl:    NON FORMULARY, Balance of Nature - 3 fruits and 3 Vegetables, Disp: , Rfl:    Omega-3 Fatty Acids (FISH OIL) 1200 MG CPDR, Take by mouth., Disp: , Rfl:    prochlorperazine (COMPAZINE) 10 MG tablet, Take 1 tablet (10 mg total) by mouth every 6 (six) hours as needed for nausea or vomiting., Disp: 60 tablet, Rfl: 3   TRASTUZUMAB IV, Inject into the vein once a week. Weekly x 12 weeks, then every 21 days, Disp: , Rfl:    TURMERIC PO, Take 3 capsules by mouth daily with lunch., Disp: , Rfl:    vitamin B-12 (CYANOCOBALAMIN) 100 MCG tablet, Take 100 mcg by mouth daily., Disp: , Rfl:    lidocaine-prilocaine (EMLA) cream, Apply a quarter sized amount to port a cath site and cover with plastic wrap 1 hour prior to infusion appointments, Disp: 30 g, Rfl: 3 No current facility-administered medications for this visit.  Facility-Administered Medications Ordered in Other Visits:    sodium chloride flush (NS) 0.9 % injection 10 mL, 10 mL, Intracatheter, PRN, Doreatha Massed, MD, 10 mL at 02/07/23 1359   sodium chloride flush (NS) 0.9 % injection 10 mL, 10 mL, Intracatheter, PRN, Doreatha Massed, MD, 10 mL at 07/12/23 1510   Allergies: Allergies  Allergen Reactions   Penicillins Other (See Comments)    "LOSS OF VISION"  Has patient had a PCN reaction causing immediate rash, facial/tongue/throat swelling, SOB or lightheadedness with hypotension: No Has patient had a PCN reaction causing severe rash involving mucus membranes or skin necrosis: No Has patient had a PCN reaction that required hospitalization: No Has patient had a PCN reaction occurring within the last 10 years: No If all of the above answers are "NO", then may proceed with  Cephalosporin use.    Oxytetracycline Other (See Comments)    Fever blisters   Tamoxifen Other (See Comments)    "GENERAL ILLNESS WITH LIGHTHEADNESS"    Aromasin [Exemestane] Other (See Comments)    UNSPECIFIED REACTION  Pt is unsure of what reaction was...   Codeine Other (See Comments)    LIGHTHEADED    REVIEW OF SYSTEMS:   Review of Systems  Constitutional:  Negative for chills, fatigue and fever.  HENT:   Negative for lump/mass, mouth sores, nosebleeds, sore throat and trouble swallowing.   Eyes:  Negative for eye problems.  Respiratory:  Negative for cough and shortness of breath.   Cardiovascular:  Negative for chest pain, leg swelling and palpitations.  Gastrointestinal:  Negative for abdominal pain, constipation, diarrhea, nausea and vomiting.  Genitourinary:  Negative for bladder incontinence, difficulty urinating, dysuria, frequency, hematuria and nocturia.   Musculoskeletal:  Negative for arthralgias, back pain, flank pain, myalgias and neck pain.  Skin:  Positive for itching. Negative for rash.  Neurological:  Positive for numbness. Negative for dizziness and headaches.  Hematological:  Does not bruise/bleed easily.  Psychiatric/Behavioral:  Positive for sleep disturbance. Negative for depression and suicidal ideas. The patient is not nervous/anxious.   All other systems reviewed and are negative.    VITALS:   Blood pressure (!) 153/61, pulse 84, temperature 97.8 F (36.6 C), temperature source Tympanic, resp. rate  16, weight 192 lb 0.3 oz (87.1 kg), SpO2 98%.  Wt Readings from Last 3 Encounters:  07/12/23 192 lb 0.3 oz (87.1 kg)  06/20/23 188 lb 9.6 oz (85.5 kg)  05/31/23 187 lb 3.2 oz (84.9 kg)    Body mass index is 32.96 kg/m.  Performance status (ECOG): 1 - Symptomatic but completely ambulatory  PHYSICAL EXAM:   Physical Exam Vitals and nursing note reviewed. Exam conducted with a chaperone present.  Constitutional:      Appearance: Normal  appearance.  Cardiovascular:     Rate and Rhythm: Normal rate and regular rhythm.     Pulses: Normal pulses.     Heart sounds: Normal heart sounds.  Pulmonary:     Effort: Pulmonary effort is normal.     Breath sounds: Normal breath sounds.  Abdominal:     Palpations: Abdomen is soft. There is no hepatomegaly, splenomegaly or mass.     Tenderness: There is no abdominal tenderness.  Musculoskeletal:     Right lower leg: No edema.     Left lower leg: No edema.  Lymphadenopathy:     Cervical: No cervical adenopathy.     Right cervical: No superficial, deep or posterior cervical adenopathy.    Left cervical: No superficial, deep or posterior cervical adenopathy.     Upper Body:     Right upper body: No supraclavicular or axillary adenopathy.     Left upper body: No supraclavicular or axillary adenopathy.  Neurological:     General: No focal deficit present.     Mental Status: She is alert and oriented to person, place, and time.  Psychiatric:        Mood and Affect: Mood normal.        Behavior: Behavior normal.     LABS:      Latest Ref Rng & Units 07/12/2023   12:35 PM 06/20/2023    8:16 AM 05/31/2023   11:17 AM  CBC  WBC 4.0 - 10.5 K/uL 10.7  7.3  10.0   Hemoglobin 12.0 - 15.0 g/dL 16.1  09.6  04.5   Hematocrit 36.0 - 46.0 % 36.4  38.1  37.9   Platelets 150 - 400 K/uL 273  264  317       Latest Ref Rng & Units 07/12/2023   12:35 PM 06/20/2023    8:16 AM 05/31/2023   11:17 AM  CMP  Glucose 70 - 99 mg/dL 87  98  90   BUN 8 - 23 mg/dL 29  22  30    Creatinine 0.44 - 1.00 mg/dL 4.09  8.11  9.14   Sodium 135 - 145 mmol/L 138  137  137   Potassium 3.5 - 5.1 mmol/L 4.1  4.0  3.9   Chloride 98 - 111 mmol/L 104  101  105   CO2 22 - 32 mmol/L 26  27  27    Calcium 8.9 - 10.3 mg/dL 9.5  9.2  9.6   Total Protein 6.5 - 8.1 g/dL 6.4  6.6  6.7   Total Bilirubin 0.3 - 1.2 mg/dL 0.5  0.6  0.6   Alkaline Phos 38 - 126 U/L 63  64  63   AST 15 - 41 U/L 16  17  16    ALT 0 - 44 U/L 21  18   21       No results found for: "CEA1", "CEA" / No results found for: "CEA1", "CEA" No results found for: "PSA1" No results found for: "NWG956" No results  found for: "CAN125"  Lab Results  Component Value Date   TOTALPROTELP 6.4 10/26/2022   ALBUMINELP 3.6 10/26/2022   A1GS 0.2 10/26/2022   A2GS 0.6 10/26/2022   BETS 1.1 10/26/2022   GAMS 0.9 10/26/2022   MSPIKE Not Observed 10/26/2022   SPEI Comment 10/26/2022   Lab Results  Component Value Date   TIBC 334 02/21/2023   TIBC 350 02/14/2023   FERRITIN 62 02/21/2023   FERRITIN 67 02/14/2023   IRONPCTSAT 10 (L) 02/21/2023   IRONPCTSAT 10 (L) 02/14/2023   Lab Results  Component Value Date   LDH 140 06/27/2011     STUDIES:   No results found.

## 2023-07-12 ENCOUNTER — Inpatient Hospital Stay: Payer: PPO

## 2023-07-12 ENCOUNTER — Inpatient Hospital Stay: Payer: PPO | Admitting: Hematology

## 2023-07-12 VITALS — BP 153/61 | HR 84 | Temp 97.8°F | Resp 16 | Wt 192.0 lb

## 2023-07-12 VITALS — BP 139/49 | HR 77 | Temp 98.6°F | Resp 18

## 2023-07-12 DIAGNOSIS — Z5112 Encounter for antineoplastic immunotherapy: Secondary | ICD-10-CM | POA: Diagnosis not present

## 2023-07-12 DIAGNOSIS — Z170 Estrogen receptor positive status [ER+]: Secondary | ICD-10-CM

## 2023-07-12 DIAGNOSIS — Z79899 Other long term (current) drug therapy: Secondary | ICD-10-CM

## 2023-07-12 DIAGNOSIS — C50412 Malignant neoplasm of upper-outer quadrant of left female breast: Secondary | ICD-10-CM | POA: Diagnosis not present

## 2023-07-12 DIAGNOSIS — Z95828 Presence of other vascular implants and grafts: Secondary | ICD-10-CM | POA: Diagnosis not present

## 2023-07-12 LAB — CBC WITH DIFFERENTIAL/PLATELET
Abs Immature Granulocytes: 0.02 10*3/uL (ref 0.00–0.07)
Basophils Absolute: 0 10*3/uL (ref 0.0–0.1)
Basophils Relative: 0 %
Eosinophils Absolute: 0.4 10*3/uL (ref 0.0–0.5)
Eosinophils Relative: 4 %
HCT: 36.4 % (ref 36.0–46.0)
Hemoglobin: 11.8 g/dL — ABNORMAL LOW (ref 12.0–15.0)
Immature Granulocytes: 0 %
Lymphocytes Relative: 16 %
Lymphs Abs: 1.7 10*3/uL (ref 0.7–4.0)
MCH: 29.8 pg (ref 26.0–34.0)
MCHC: 32.4 g/dL (ref 30.0–36.0)
MCV: 91.9 fL (ref 80.0–100.0)
Monocytes Absolute: 1 10*3/uL (ref 0.1–1.0)
Monocytes Relative: 9 %
Neutro Abs: 7.5 10*3/uL (ref 1.7–7.7)
Neutrophils Relative %: 71 %
Platelets: 273 10*3/uL (ref 150–400)
RBC: 3.96 MIL/uL (ref 3.87–5.11)
RDW: 12.3 % (ref 11.5–15.5)
WBC: 10.7 10*3/uL — ABNORMAL HIGH (ref 4.0–10.5)
nRBC: 0 % (ref 0.0–0.2)

## 2023-07-12 LAB — COMPREHENSIVE METABOLIC PANEL
ALT: 21 U/L (ref 0–44)
AST: 16 U/L (ref 15–41)
Albumin: 3.6 g/dL (ref 3.5–5.0)
Alkaline Phosphatase: 63 U/L (ref 38–126)
Anion gap: 8 (ref 5–15)
BUN: 29 mg/dL — ABNORMAL HIGH (ref 8–23)
CO2: 26 mmol/L (ref 22–32)
Calcium: 9.5 mg/dL (ref 8.9–10.3)
Chloride: 104 mmol/L (ref 98–111)
Creatinine, Ser: 1.19 mg/dL — ABNORMAL HIGH (ref 0.44–1.00)
GFR, Estimated: 47 mL/min — ABNORMAL LOW (ref >60)
Glucose, Bld: 87 mg/dL (ref 70–99)
Potassium: 4.1 mmol/L (ref 3.5–5.1)
Sodium: 138 mmol/L (ref 135–145)
Total Bilirubin: 0.5 mg/dL (ref 0.3–1.2)
Total Protein: 6.4 g/dL — ABNORMAL LOW (ref 6.5–8.1)

## 2023-07-12 LAB — MAGNESIUM: Magnesium: 2.2 mg/dL (ref 1.7–2.4)

## 2023-07-12 MED ORDER — CETIRIZINE HCL 10 MG/ML IV SOLN
10.0000 mg | Freq: Once | INTRAVENOUS | Status: AC
  Administered 2023-07-12: 10 mg via INTRAVENOUS
  Filled 2023-07-12: qty 1

## 2023-07-12 MED ORDER — TRASTUZUMAB-ANNS CHEMO 150 MG IV SOLR
6.0000 mg/kg | Freq: Once | INTRAVENOUS | Status: AC
  Administered 2023-07-12: 504 mg via INTRAVENOUS
  Filled 2023-07-12: qty 24

## 2023-07-12 MED ORDER — SODIUM CHLORIDE 0.9 % IV SOLN: Freq: Once | INTRAVENOUS | Status: AC

## 2023-07-12 MED ORDER — SODIUM CHLORIDE 0.9% FLUSH
10.0000 mL | INTRAVENOUS | Status: DC | PRN
  Administered 2023-07-12: 10 mL

## 2023-07-12 MED ORDER — HEPARIN SOD (PORK) LOCK FLUSH 100 UNIT/ML IV SOLN
500.0000 [IU] | Freq: Once | INTRAVENOUS | Status: AC | PRN
  Administered 2023-07-12: 500 [IU]

## 2023-07-12 MED ORDER — ACETAMINOPHEN 325 MG PO TABS
650.0000 mg | ORAL_TABLET | Freq: Once | ORAL | Status: AC
  Administered 2023-07-12: 650 mg via ORAL
  Filled 2023-07-12: qty 2

## 2023-07-12 NOTE — Progress Notes (Signed)
Patient presents today for chemotherapy infusion. Patient is in satisfactory condition with no new complaints voiced.  Vital signs are stable.  Labs reviewed by Dr. Katragadda during the office visit and all labs are within treatment parameters.  We will proceed with treatment per MD orders.   Patient tolerated treatment well with no complaints voiced.  Patient left ambulatory in stable condition.  Vital signs stable at discharge.  Follow up as scheduled.       

## 2023-07-12 NOTE — Patient Instructions (Signed)
[facility] [place]  Discharge Instructions: Thank you for choosing [facility] to provide your oncology and hematology care.  If you have a lab appointment with the Cancer Center - please note that after April 8th, 2024, all labs will be drawn in the cancer center.  You do not have to check in or register with the main entrance as you have in the past but will complete your check-in in the cancer center.  Wear comfortable clothing and clothing appropriate for easy access to any Portacath or PICC line.   We strive to give you quality time with your provider. You may need to reschedule your appointment if you arrive late (15 or more minutes).  Arriving late affects you and other patients whose appointments are after yours.  Also, if you miss three or more appointments without notifying the office, you may be dismissed from the clinic at the provider's discretion.      For prescription refill requests, have your pharmacy contact our office and allow 72 hours for refills to be completed.    Today you received the following chemotherapy and/or immunotherapy agents Kanjinti.  Trastuzumab Injection What is this medication? TRASTUZUMAB (tras TOO zoo mab) treats breast cancer and stomach cancer. It works by blocking a protein that causes cancer cells to grow and multiply. This helps to slow or stop the spread of cancer cells. This medicine may be used for other purposes; ask your health care provider or pharmacist if you have questions. COMMON BRAND NAME(S): Herceptin, Marlowe Alt, Ontruzant, Trazimera What should I tell my care team before I take this medication? They need to know if you have any of these conditions: Heart failure Lung disease An unusual or allergic reaction to trastuzumab, other medications, foods, dyes, or preservatives Pregnant or trying to get pregnant Breast-feeding How should I use this medication? This medication is injected into a  vein. It is given by your care team in a hospital or clinic setting. Talk to your care team about the use of this medication in children. It is not approved for use in children. Overdosage: If you think you have taken too much of this medicine contact a poison control center or emergency room at once. NOTE: This medicine is only for you. Do not share this medicine with others. What if I miss a dose? Keep appointments for follow-up doses. It is important not to miss your dose. Call your care team if you are unable to keep an appointment. What may interact with this medication? Certain types of chemotherapy, such as daunorubicin, doxorubicin, epirubicin, idarubicin This list may not describe all possible interactions. Give your health care provider a list of all the medicines, herbs, non-prescription drugs, or dietary supplements you use. Also tell them if you smoke, drink alcohol, or use illegal drugs. Some items may interact with your medicine. What should I watch for while using this medication? Your condition will be monitored carefully while you are receiving this medication. This medication may make you feel generally unwell. This is not uncommon, as chemotherapy affects healthy cells as well as cancer cells. Report any side effects. Continue your course of treatment even though you feel ill unless your care team tells you to stop. This medication may increase your risk of getting an infection. Call your care team for advice if you get a fever, chills, sore throat, or other symptoms of a cold or flu. Do not treat yourself. Try to avoid being around people who  are sick. Avoid taking medications that contain aspirin, acetaminophen, ibuprofen, naproxen, or ketoprofen unless instructed by your care team. These medications can hide a fever. Talk to your care team if you may be pregnant. Serious birth defects can occur if you take this medication during pregnancy and for 7 months after the last dose. You  will need a negative pregnancy test before starting this medication. Contraception is recommended while taking this medication and for 7 months after the last dose. Your care team can help you find the option that works for you. Do not breastfeed while taking this medication and for 7 months after stopping treatment. What side effects may I notice from receiving this medication? Side effects that you should report to your care team as soon as possible: Allergic reactions or angioedema--skin rash, itching or hives, swelling of the face, eyes, lips, tongue, arms, or legs, trouble swallowing or breathing Dry cough, shortness of breath or trouble breathing Heart failure--shortness of breath, swelling of the ankles, feet, or hands, sudden weight gain, unusual weakness or fatigue Infection--fever, chills, cough, or sore throat Infusion reactions--chest pain, shortness of breath or trouble breathing, feeling faint or lightheaded Side effects that usually do not require medical attention (report to your care team if they continue or are bothersome): Diarrhea Dizziness Headache Nausea Trouble sleeping Vomiting This list may not describe all possible side effects. Call your doctor for medical advice about side effects. You may report side effects to FDA at 1-800-FDA-1088. Where should I keep my medication? This medication is given in a hospital or clinic. It will not be stored at home. NOTE: This sheet is a summary. It may not cover all possible information. If you have questions about this medicine, talk to your doctor, pharmacist, or health care provider.  2024 Elsevier/Gold Standard (2022-03-15 00:00:00)        To help prevent nausea and vomiting after your treatment, we encourage you to take your nausea medication as directed.  BELOW ARE SYMPTOMS THAT SHOULD BE REPORTED IMMEDIATELY: *FEVER GREATER THAN 100.4 F (38 C) OR HIGHER *CHILLS OR SWEATING *NAUSEA AND VOMITING THAT IS NOT CONTROLLED  WITH YOUR NAUSEA MEDICATION *UNUSUAL SHORTNESS OF BREATH *UNUSUAL BRUISING OR BLEEDING *URINARY PROBLEMS (pain or burning when urinating, or frequent urination) *BOWEL PROBLEMS (unusual diarrhea, constipation, pain near the anus) TENDERNESS IN MOUTH AND THROAT WITH OR WITHOUT PRESENCE OF ULCERS (sore throat, sores in mouth, or a toothache) UNUSUAL RASH, SWELLING OR PAIN  UNUSUAL VAGINAL DISCHARGE OR ITCHING   Items with * indicate a potential emergency and should be followed up as soon as possible or go to the Emergency Department if any problems should occur.  Please show the CHEMOTHERAPY ALERT CARD or IMMUNOTHERAPY ALERT CARD at check-in to the Emergency Department and triage nurse.  Should you have questions after your visit or need to cancel or reschedule your appointment, please contact [facility] [phone]  and follow the prompts.  Office hours are 8:00 a.m. to 4:30 p.m. Monday - Friday. Please note that voicemails left after 4:00 p.m. may not be returned until the following business day.  We are closed weekends and major holidays. You have access to a nurse at all times for urgent questions. Please call the main number to the clinic [phone] and follow the prompts.  For any non-urgent questions, you may also contact your provider using MyChart. We now offer e-Visits for anyone 73 and older to request care online for non-urgent symptoms. For details visit [email or web address].  Also download the MyChart app! Go to the app store, search "MyChart", open the app, select [place], and log in with your MyChart username and password.

## 2023-07-12 NOTE — Progress Notes (Signed)
Patient has been examined by Dr. Katragadda. Vital signs and labs have been reviewed by MD - ANC, Creatinine, LFTs, hemoglobin, and platelets are within treatment parameters per M.D. - pt may proceed with treatment.  Primary RN and pharmacy notified.  

## 2023-07-12 NOTE — Patient Instructions (Signed)

## 2023-07-14 ENCOUNTER — Other Ambulatory Visit: Payer: Self-pay

## 2023-07-19 DIAGNOSIS — R7301 Impaired fasting glucose: Secondary | ICD-10-CM | POA: Diagnosis not present

## 2023-07-19 DIAGNOSIS — E669 Obesity, unspecified: Secondary | ICD-10-CM | POA: Diagnosis not present

## 2023-07-19 DIAGNOSIS — E782 Mixed hyperlipidemia: Secondary | ICD-10-CM | POA: Diagnosis not present

## 2023-07-24 ENCOUNTER — Other Ambulatory Visit: Payer: Self-pay

## 2023-07-26 DIAGNOSIS — E7800 Pure hypercholesterolemia, unspecified: Secondary | ICD-10-CM | POA: Diagnosis not present

## 2023-07-26 DIAGNOSIS — H269 Unspecified cataract: Secondary | ICD-10-CM | POA: Diagnosis not present

## 2023-07-26 DIAGNOSIS — C50412 Malignant neoplasm of upper-outer quadrant of left female breast: Secondary | ICD-10-CM | POA: Diagnosis not present

## 2023-07-26 DIAGNOSIS — R7301 Impaired fasting glucose: Secondary | ICD-10-CM | POA: Diagnosis not present

## 2023-07-26 DIAGNOSIS — I10 Essential (primary) hypertension: Secondary | ICD-10-CM | POA: Diagnosis not present

## 2023-07-26 DIAGNOSIS — N1831 Chronic kidney disease, stage 3a: Secondary | ICD-10-CM | POA: Diagnosis not present

## 2023-07-26 DIAGNOSIS — I129 Hypertensive chronic kidney disease with stage 1 through stage 4 chronic kidney disease, or unspecified chronic kidney disease: Secondary | ICD-10-CM | POA: Diagnosis not present

## 2023-07-26 DIAGNOSIS — M1990 Unspecified osteoarthritis, unspecified site: Secondary | ICD-10-CM | POA: Diagnosis not present

## 2023-07-26 DIAGNOSIS — E669 Obesity, unspecified: Secondary | ICD-10-CM | POA: Diagnosis not present

## 2023-07-26 DIAGNOSIS — E870 Hyperosmolality and hypernatremia: Secondary | ICD-10-CM | POA: Diagnosis not present

## 2023-07-26 DIAGNOSIS — G609 Hereditary and idiopathic neuropathy, unspecified: Secondary | ICD-10-CM | POA: Diagnosis not present

## 2023-07-26 DIAGNOSIS — M8580 Other specified disorders of bone density and structure, unspecified site: Secondary | ICD-10-CM | POA: Diagnosis not present

## 2023-08-02 ENCOUNTER — Inpatient Hospital Stay: Payer: PPO

## 2023-08-02 ENCOUNTER — Inpatient Hospital Stay: Payer: PPO | Attending: Hematology

## 2023-08-02 VITALS — BP 129/43 | HR 72 | Temp 97.4°F | Resp 18

## 2023-08-02 DIAGNOSIS — Z5112 Encounter for antineoplastic immunotherapy: Secondary | ICD-10-CM | POA: Diagnosis not present

## 2023-08-02 DIAGNOSIS — Z95828 Presence of other vascular implants and grafts: Secondary | ICD-10-CM

## 2023-08-02 DIAGNOSIS — Z79620 Long term (current) use of immunosuppressive biologic: Secondary | ICD-10-CM | POA: Diagnosis not present

## 2023-08-02 DIAGNOSIS — Z170 Estrogen receptor positive status [ER+]: Secondary | ICD-10-CM | POA: Diagnosis not present

## 2023-08-02 DIAGNOSIS — C50412 Malignant neoplasm of upper-outer quadrant of left female breast: Secondary | ICD-10-CM | POA: Insufficient documentation

## 2023-08-02 LAB — CBC WITH DIFFERENTIAL/PLATELET
Abs Immature Granulocytes: 0.01 10*3/uL (ref 0.00–0.07)
Basophils Absolute: 0 10*3/uL (ref 0.0–0.1)
Basophils Relative: 0 %
Eosinophils Absolute: 0.3 10*3/uL (ref 0.0–0.5)
Eosinophils Relative: 3 %
HCT: 38.3 % (ref 36.0–46.0)
Hemoglobin: 12.4 g/dL (ref 12.0–15.0)
Immature Granulocytes: 0 %
Lymphocytes Relative: 19 %
Lymphs Abs: 1.7 10*3/uL (ref 0.7–4.0)
MCH: 29.6 pg (ref 26.0–34.0)
MCHC: 32.4 g/dL (ref 30.0–36.0)
MCV: 91.4 fL (ref 80.0–100.0)
Monocytes Absolute: 0.8 10*3/uL (ref 0.1–1.0)
Monocytes Relative: 8 %
Neutro Abs: 6.3 10*3/uL (ref 1.7–7.7)
Neutrophils Relative %: 70 %
Platelets: 305 10*3/uL (ref 150–400)
RBC: 4.19 MIL/uL (ref 3.87–5.11)
RDW: 12.5 % (ref 11.5–15.5)
WBC: 9 10*3/uL (ref 4.0–10.5)
nRBC: 0 % (ref 0.0–0.2)

## 2023-08-02 LAB — COMPREHENSIVE METABOLIC PANEL WITH GFR
ALT: 18 U/L (ref 0–44)
AST: 16 U/L (ref 15–41)
Albumin: 3.8 g/dL (ref 3.5–5.0)
Alkaline Phosphatase: 63 U/L (ref 38–126)
Anion gap: 7 (ref 5–15)
BUN: 26 mg/dL — ABNORMAL HIGH (ref 8–23)
CO2: 26 mmol/L (ref 22–32)
Calcium: 9.8 mg/dL (ref 8.9–10.3)
Chloride: 103 mmol/L (ref 98–111)
Creatinine, Ser: 1.31 mg/dL — ABNORMAL HIGH (ref 0.44–1.00)
GFR, Estimated: 42 mL/min — ABNORMAL LOW (ref >60)
Glucose, Bld: 82 mg/dL (ref 70–99)
Potassium: 4.2 mmol/L (ref 3.5–5.1)
Sodium: 136 mmol/L (ref 135–145)
Total Bilirubin: 0.6 mg/dL (ref 0.3–1.2)
Total Protein: 6.8 g/dL (ref 6.5–8.1)

## 2023-08-02 LAB — TSH: TSH: 1.143 u[IU]/mL (ref 0.350–4.500)

## 2023-08-02 LAB — MAGNESIUM: Magnesium: 2.1 mg/dL (ref 1.7–2.4)

## 2023-08-02 MED ORDER — ACETAMINOPHEN 325 MG PO TABS
650.0000 mg | ORAL_TABLET | Freq: Once | ORAL | Status: AC
  Administered 2023-08-02: 650 mg via ORAL
  Filled 2023-08-02: qty 2

## 2023-08-02 MED ORDER — SODIUM CHLORIDE 0.9 % IV SOLN: Freq: Once | INTRAVENOUS | Status: AC

## 2023-08-02 MED ORDER — SODIUM CHLORIDE 0.9% FLUSH
10.0000 mL | INTRAVENOUS | Status: DC | PRN
  Administered 2023-08-02: 10 mL

## 2023-08-02 MED ORDER — HEPARIN SOD (PORK) LOCK FLUSH 100 UNIT/ML IV SOLN
500.0000 [IU] | Freq: Once | INTRAVENOUS | Status: AC | PRN
  Administered 2023-08-02: 500 [IU]

## 2023-08-02 MED ORDER — SODIUM CHLORIDE 0.9% FLUSH
10.0000 mL | Freq: Once | INTRAVENOUS | Status: AC
  Administered 2023-08-02: 10 mL via INTRAVENOUS

## 2023-08-02 MED ORDER — TRASTUZUMAB-ANNS CHEMO 150 MG IV SOLR
6.0000 mg/kg | Freq: Once | INTRAVENOUS | Status: AC
  Administered 2023-08-02: 504 mg via INTRAVENOUS
  Filled 2023-08-02: qty 24

## 2023-08-02 MED ORDER — CETIRIZINE HCL 10 MG/ML IV SOLN
10.0000 mg | Freq: Once | INTRAVENOUS | Status: AC
  Administered 2023-08-02: 10 mg via INTRAVENOUS
  Filled 2023-08-02: qty 1

## 2023-08-02 NOTE — Patient Instructions (Signed)
[facility]  Discharge Instructions: Thank you for choosing  Cancer Center to provide your oncology and hematology care.  If you have a lab appointment with the Cancer Center - please note that after April 8th, 2024, all labs will be drawn in the cancer center.  You do not have to check in or register with the main entrance as you have in the past but will complete your check-in in the cancer center.  Wear comfortable clothing and clothing appropriate for easy access to any Portacath or PICC line.   We strive to give you quality time with your provider. You may need to reschedule your appointment if you arrive late (15 or more minutes).  Arriving late affects you and other patients whose appointments are after yours.  Also, if you miss three or more appointments without notifying the office, you may be dismissed from the clinic at the provider's discretion.      For prescription refill requests, have your pharmacy contact our office and allow 72 hours for refills to be completed.    Today you received the following chemotherapy and/or immunotherapy agents kanjiniti   To help prevent nausea and vomiting after your treatment, we encourage you to take your nausea medication as directed.  Trastuzumab Injection What is this medication? TRASTUZUMAB (tras TOO zoo mab) treats breast cancer and stomach cancer. It works by blocking a protein that causes cancer cells to grow and multiply. This helps to slow or stop the spread of cancer cells. This medicine may be used for other purposes; ask your health care provider or pharmacist if you have questions. COMMON BRAND NAME(S): Herceptin, Marlowe Alt, Ontruzant, Trazimera What should I tell my care team before I take this medication? They need to know if you have any of these conditions: Heart failure Lung disease An unusual or allergic reaction to trastuzumab, other medications, foods, dyes, or  preservatives Pregnant or trying to get pregnant Breast-feeding How should I use this medication? This medication is injected into a vein. It is given by your care team in a hospital or clinic setting. Talk to your care team about the use of this medication in children. It is not approved for use in children. Overdosage: If you think you have taken too much of this medicine contact a poison control center or emergency room at once. NOTE: This medicine is only for you. Do not share this medicine with others. What if I miss a dose? Keep appointments for follow-up doses. It is important not to miss your dose. Call your care team if you are unable to keep an appointment. What may interact with this medication? Certain types of chemotherapy, such as daunorubicin, doxorubicin, epirubicin, idarubicin This list may not describe all possible interactions. Give your health care provider a list of all the medicines, herbs, non-prescription drugs, or dietary supplements you use. Also tell them if you smoke, drink alcohol, or use illegal drugs. Some items may interact with your medicine. What should I watch for while using this medication? Your condition will be monitored carefully while you are receiving this medication. This medication may make you feel generally unwell. This is not uncommon, as chemotherapy affects healthy cells as well as cancer cells. Report any side effects. Continue your course of treatment even though you feel ill unless your care team tells you to stop. This medication may increase your risk of getting an infection. Call your care team for advice if you get a fever, chills,  sore throat, or other symptoms of a cold or flu. Do not treat yourself. Try to avoid being around people who are sick. Avoid taking medications that contain aspirin, acetaminophen, ibuprofen, naproxen, or ketoprofen unless instructed by your care team. These medications can hide a fever. Talk to your care team if  you may be pregnant. Serious birth defects can occur if you take this medication during pregnancy and for 7 months after the last dose. You will need a negative pregnancy test before starting this medication. Contraception is recommended while taking this medication and for 7 months after the last dose. Your care team can help you find the option that works for you. Do not breastfeed while taking this medication and for 7 months after stopping treatment. What side effects may I notice from receiving this medication? Side effects that you should report to your care team as soon as possible: Allergic reactions or angioedema--skin rash, itching or hives, swelling of the face, eyes, lips, tongue, arms, or legs, trouble swallowing or breathing Dry cough, shortness of breath or trouble breathing Heart failure--shortness of breath, swelling of the ankles, feet, or hands, sudden weight gain, unusual weakness or fatigue Infection--fever, chills, cough, or sore throat Infusion reactions--chest pain, shortness of breath or trouble breathing, feeling faint or lightheaded Side effects that usually do not require medical attention (report to your care team if they continue or are bothersome): Diarrhea Dizziness Headache Nausea Trouble sleeping Vomiting This list may not describe all possible side effects. Call your doctor for medical advice about side effects. You may report side effects to FDA at 1-800-FDA-1088. Where should I keep my medication? This medication is given in a hospital or clinic. It will not be stored at home. NOTE: This sheet is a summary. It may not cover all possible information. If you have questions about this medicine, talk to your doctor, pharmacist, or health care provider.  2024 Elsevier/Gold Standard (2022-03-15 00:00:00)   BELOW ARE SYMPTOMS THAT SHOULD BE REPORTED IMMEDIATELY: *FEVER GREATER THAN 100.4 F (38 C) OR HIGHER *CHILLS OR SWEATING *NAUSEA AND VOMITING THAT IS NOT  CONTROLLED WITH YOUR NAUSEA MEDICATION *UNUSUAL SHORTNESS OF BREATH *UNUSUAL BRUISING OR BLEEDING *URINARY PROBLEMS (pain or burning when urinating, or frequent urination) *BOWEL PROBLEMS (unusual diarrhea, constipation, pain near the anus) TENDERNESS IN MOUTH AND THROAT WITH OR WITHOUT PRESENCE OF ULCERS (sore throat, sores in mouth, or a toothache) UNUSUAL RASH, SWELLING OR PAIN  UNUSUAL VAGINAL DISCHARGE OR ITCHING   Items with * indicate a potential emergency and should be followed up as soon as possible or go to the Emergency Department if any problems should occur.  Please show the CHEMOTHERAPY ALERT CARD or IMMUNOTHERAPY ALERT CARD at check-in to the Emergency Department and triage nurse.  Should you have questions after your visit or need to cancel or reschedule your appointment, please contact [facility] [phone]  and follow the prompts.  Office hours are 8:00 a.m. to 4:30 p.m. Monday - Friday. Please note that voicemails left after 4:00 p.m. may not be returned until the following business day.  We are closed weekends and major holidays. You have access to a nurse at all times for urgent questions. Please call the main number to the clinic [phone] and follow the prompts.  For any non-urgent questions, you may also contact your provider using MyChart. We now offer e-Visits for anyone 40 and older to request care online for non-urgent symptoms. For details visit [email or web address].   Also download  the MyChart app! Go to the app store, search "MyChart", open the app, select [facility], and log in with your MyChart username and password.

## 2023-08-02 NOTE — Progress Notes (Signed)
Patient presents today for Kanjinti infusion.  Patient is in satisfactory condition with no new complaints voiced.  Vital signs are stable.  Labs reviewed and all labs are within treatment parameters.  We will proceed with treatment per MD orders.    Treatment given today per MD orders. Tolerated infusion without adverse affects. Vital signs stable. No complaints at this time. Discharged from clinic ambulatory in stable condition. Alert and oriented x 3. F/U with [facility] as scheduled.

## 2023-08-07 ENCOUNTER — Ambulatory Visit: Payer: PPO | Attending: General Surgery

## 2023-08-07 VITALS — Wt 188.5 lb

## 2023-08-07 DIAGNOSIS — Z483 Aftercare following surgery for neoplasm: Secondary | ICD-10-CM | POA: Insufficient documentation

## 2023-08-07 NOTE — Therapy (Signed)
OUTPATIENT PHYSICAL THERAPY SOZO SCREENING NOTE   Patient Name: Haley Peterson MRN: [id number] DOB:12/27/1945, 77 y.o., female Today's Date: 08/07/2023  PCP: Benita Stabile, MD REFERRING PROVIDER: Emelia Loron, MD   PT End of Session - 08/07/23 [id number]     Visit Number 2   # unchnaged due to screen only   PT Start Time 0953    PT Stop Time 0957    PT Time Calculation (min) 4 min    Activity Tolerance Patient tolerated treatment well    Behavior During Therapy [facility] for tasks assessed/performed             Past Medical History:  Diagnosis Date   Arthritis    Bone spur    left heel   Breast cancer (HCC)    bilateral   Cancer (HCC)    Bilateral Breast cancer   Cataract    right eye    CKD (chronic kidney disease), stage III (HCC)    mgd by PCP    History of breast cancer    left, right   Hypertension    Personal history of radiation therapy    Pneumonia 10/2012   Port-A-Cath in place 12/06/2022   Past Surgical History:  Procedure Laterality Date   ABDOMINAL HYSTERECTOMY     BIOPSY  04/12/2017   Procedure: BIOPSY;  Surgeon: Malissa Hippo, MD;  [facility];  Service: Endoscopy;;  ascending colon ulcer biopsies   BREAST BIOPSY Left 09/22/2022   [place] LT BREAST BX W LOC DEV EA ADD LESION IMG BX SPEC US GUIDE 09/22/2022 [facility]   BREAST BIOPSY Left 09/22/2022   [place] LT BREAST BX W LOC DEV 1ST LESION IMG BX SPEC US GUIDE 09/22/2022 [facility]   BREAST LUMPECTOMY  08/28/2001   left   BREAST RECONSTRUCTION Right    implant   CHOLECYSTECTOMY  2002   COLONOSCOPY     COLONOSCOPY N/A 04/12/2017   Procedure: COLONOSCOPY;  Surgeon: Malissa Hippo, MD;  [facility];  Service: Endoscopy;  Laterality: N/A;  830   MASTECTOMY  08/15/2006   right   PARTIAL HYSTERECTOMY  1985   PORTACATH PLACEMENT Right 10/31/2022   Procedure: INSERTION PORT-A-CATH;  Surgeon: Emelia Loron, MD;  Location: [facility];  Service: General;   Laterality: Right;   REPLACEMENT TOTAL KNEE  2009, 2010   right-2010, left 2009   SIMPLE MASTECTOMY WITH AXILLARY SENTINEL NODE BIOPSY Left 10/31/2022   Procedure: LEFT MASTECTOMY;  Surgeon: Emelia Loron, MD;  Location:  SURGERY CENTER;  Service: General;  Laterality: Left;  90 MIN ROOM 2   TISSUE EXPANDER PLACEMENT     TOTAL HIP ARTHROPLASTY Right 10/09/2017   Procedure: RIGHT TOTAL HIP ARTHROPLASTY ANTERIOR APPROACH;  Surgeon: Gean Birchwood, MD;  Location: [facility];  Service: Orthopedics;  Laterality: Right;   TOTAL HIP ARTHROPLASTY Left 09/23/2019   Procedure: Left Anterior Hip Arthroplasty;  Surgeon: Gean Birchwood, MD;  Location: [facility];  Service: Orthopedics;  Laterality: Left;   Patient Active Problem List   Diagnosis Date Noted   Iron deficiency anemia 02/21/2023   Port-A-Cath in place 12/06/2022   S/P left mastectomy 10/31/2022   Breast cancer of upper-outer quadrant of left female breast (HCC) 10/25/2022   S/P hip replacement, left 09/23/2019   Osteoarthritis of left hip 09/20/2019   Primary osteoarthritis of right hip 10/09/2017   Osteoarthritis of right hip 10/04/2017   Special screening for malignant neoplasms, colon 12/30/2016   Breast  cancer (HCC) 06/27/2011   Hx Breast cancer, Right, multifocal IDC, receptor +, Her2 - 06/07/2006    Class: Stage 1   Hx Breast cancer (DCIS), Right, Stage 0,  08/08/2001    REFERRING DIAG: left breast cancer at risk for lymphedema  THERAPY DIAG: Aftercare following surgery for neoplasm  PERTINENT HISTORY: Patient was diagnosed on 10/13/22 with left breast cancer in 2 locations measuring around 2.5cm in total. Negative axillary [place]. grade 2 IDC. It is ER/PR/HER2 positive with a Ki67 of 20%.  Prior hx of Lt lumpectomy with radiation and SLNB with 1 negative lymph node removed in 2002 and Rt mastectomy with implant in 2007. Lt mastectomy 10/31/22 with 1 negative node removed.   PRECAUTIONS: left UE Lymphedema risk  SUBJECTIVE: Pt  returns for her 3 month L-Dex screen.   PAIN:  Are you having pain? No  SOZO SCREENING: Patient was assessed today using the SOZO machine to determine the lymphedema index score. This was compared to her baseline score. It was determined that she is within the recommended range when compared to her baseline and no further action is needed at this time. She will continue SOZO screenings. These are done every 3 months for 2 years post operatively followed by every 6 months for 2 years, and then annually.  P: SOZO Q16months until at least 10/31/24 and Q21months until 10/31/26   L-DEX FLOWSHEETS - 08/07/23 0900       L-DEX LYMPHEDEMA SCREENING   Measurement Type Unilateral    L-DEX MEASUREMENT EXTREMITY Upper Extremity    POSITION  Standing    DOMINANT SIDE Right    At Risk Side Left    BASELINE SCORE (UNILATERAL) 2.8    L-DEX SCORE (UNILATERAL) 3.4    VALUE CHANGE (UNILAT) 0.6              Hermenia Bers, PTA 08/07/2023, 9:57 AM

## 2023-08-08 ENCOUNTER — Other Ambulatory Visit: Payer: Self-pay

## 2023-08-09 ENCOUNTER — Ambulatory Visit (HOSPITAL_COMMUNITY)
Admission: RE | Admit: 2023-08-09 | Discharge: 2023-08-09 | Disposition: A | Discharge status: Home / Self Care | Payer: PPO | Source: Ambulatory Visit | Attending: Hematology | Admitting: Hematology

## 2023-08-09 DIAGNOSIS — Z170 Estrogen receptor positive status [ER+]: Secondary | ICD-10-CM | POA: Diagnosis not present

## 2023-08-09 DIAGNOSIS — Z79899 Other long term (current) drug therapy: Secondary | ICD-10-CM | POA: Insufficient documentation

## 2023-08-09 DIAGNOSIS — C50412 Malignant neoplasm of upper-outer quadrant of left female breast: Secondary | ICD-10-CM | POA: Diagnosis not present

## 2023-08-09 LAB — ECHOCARDIOGRAM COMPLETE
AR max vel: 1.65 cm2
AV Area VTI: 1.78 cm2
AV Area mean vel: 1.64 cm2
AV Mean grad: 6.5 mmHg
AV Peak grad: 12.5 mmHg
Ao pk vel: 1.77 m/s
Area-P 1/2: 3.91 cm2
Calc EF: 61 %
S' Lateral: 2.7 cm
Single Plane A2C EF: 61 %
Single Plane A4C EF: 61 %

## 2023-08-09 NOTE — Progress Notes (Signed)
*  PRELIMINARY RESULTS* Echocardiogram 2D Echocardiogram has been performed.  [name] 08/09/2023, 10:16 AM

## 2023-08-15 ENCOUNTER — Encounter: Payer: Self-pay | Admitting: Internal Medicine

## 2023-08-23 ENCOUNTER — Inpatient Hospital Stay: Payer: PPO | Attending: Hematology

## 2023-08-23 ENCOUNTER — Inpatient Hospital Stay: Payer: PPO | Admitting: Hematology

## 2023-08-23 ENCOUNTER — Inpatient Hospital Stay: Payer: PPO

## 2023-08-23 DIAGNOSIS — Z95828 Presence of other vascular implants and grafts: Secondary | ICD-10-CM

## 2023-08-23 DIAGNOSIS — C50412 Malignant neoplasm of upper-outer quadrant of left female breast: Secondary | ICD-10-CM

## 2023-08-23 DIAGNOSIS — Z5112 Encounter for antineoplastic immunotherapy: Secondary | ICD-10-CM | POA: Insufficient documentation

## 2023-08-23 DIAGNOSIS — Z170 Estrogen receptor positive status [ER+]: Secondary | ICD-10-CM | POA: Diagnosis not present

## 2023-08-23 DIAGNOSIS — Z79899 Other long term (current) drug therapy: Secondary | ICD-10-CM | POA: Insufficient documentation

## 2023-08-23 LAB — CBC WITH DIFFERENTIAL/PLATELET
Abs Immature Granulocytes: 0.04 10*3/uL (ref 0.00–0.07)
Basophils Absolute: 0.1 10*3/uL (ref 0.0–0.1)
Basophils Relative: 1 %
Eosinophils Absolute: 0.2 10*3/uL (ref 0.0–0.5)
Eosinophils Relative: 2 %
HCT: 36.7 % (ref 36.0–46.0)
Hemoglobin: 11.9 g/dL — ABNORMAL LOW (ref 12.0–15.0)
Immature Granulocytes: 0 %
Lymphocytes Relative: 16 %
Lymphs Abs: 1.6 10*3/uL (ref 0.7–4.0)
MCH: 29.7 pg (ref 26.0–34.0)
MCHC: 32.4 g/dL (ref 30.0–36.0)
MCV: 91.5 fL (ref 80.0–100.0)
Monocytes Absolute: 0.9 10*3/uL (ref 0.1–1.0)
Monocytes Relative: 9 %
Neutro Abs: 7.8 10*3/uL — ABNORMAL HIGH (ref 1.7–7.7)
Neutrophils Relative %: 72 %
Platelets: 323 10*3/uL (ref 150–400)
RBC: 4.01 MIL/uL (ref 3.87–5.11)
RDW: 12.7 % (ref 11.5–15.5)
WBC: 10.6 10*3/uL — ABNORMAL HIGH (ref 4.0–10.5)
nRBC: 0 % (ref 0.0–0.2)

## 2023-08-23 LAB — COMPREHENSIVE METABOLIC PANEL
ALT: 18 U/L (ref 0–44)
AST: 16 U/L (ref 15–41)
Albumin: 3.9 g/dL (ref 3.5–5.0)
Alkaline Phosphatase: 63 U/L (ref 38–126)
Anion gap: 6 (ref 5–15)
BUN: 25 mg/dL — ABNORMAL HIGH (ref 8–23)
CO2: 28 mmol/L (ref 22–32)
Calcium: 9.7 mg/dL (ref 8.9–10.3)
Chloride: 103 mmol/L (ref 98–111)
Creatinine, Ser: 1.27 mg/dL — ABNORMAL HIGH (ref 0.44–1.00)
GFR, Estimated: 44 mL/min — ABNORMAL LOW (ref >60)
Glucose, Bld: 88 mg/dL (ref 70–99)
Potassium: 3.7 mmol/L (ref 3.5–5.1)
Sodium: 137 mmol/L (ref 135–145)
Total Bilirubin: 0.6 mg/dL (ref 0.3–1.2)
Total Protein: 6.7 g/dL (ref 6.5–8.1)

## 2023-08-23 LAB — MAGNESIUM: Magnesium: 2.3 mg/dL (ref 1.7–2.4)

## 2023-08-23 MED ORDER — TRASTUZUMAB-ANNS CHEMO 150 MG IV SOLR
6.0000 mg/kg | Freq: Once | INTRAVENOUS | Status: AC
  Administered 2023-08-23: 504 mg via INTRAVENOUS
  Filled 2023-08-23: qty 24

## 2023-08-23 MED ORDER — ACETAMINOPHEN 325 MG PO TABS
650.0000 mg | ORAL_TABLET | Freq: Once | ORAL | Status: AC
  Administered 2023-08-23: 650 mg via ORAL
  Filled 2023-08-23: qty 2

## 2023-08-23 MED ORDER — SODIUM CHLORIDE 0.9% FLUSH
10.0000 mL | INTRAVENOUS | Status: DC | PRN
  Administered 2023-08-23: 10 mL

## 2023-08-23 MED ORDER — CETIRIZINE HCL 10 MG/ML IV SOLN
10.0000 mg | Freq: Once | INTRAVENOUS | Status: AC
  Administered 2023-08-23: 10 mg via INTRAVENOUS
  Filled 2023-08-23: qty 1

## 2023-08-23 MED ORDER — SODIUM CHLORIDE 0.9 % IV SOLN: Freq: Once | INTRAVENOUS | Status: AC

## 2023-08-23 MED ORDER — HEPARIN SOD (PORK) LOCK FLUSH 100 UNIT/ML IV SOLN
500.0000 [IU] | Freq: Once | INTRAVENOUS | Status: AC | PRN
  Administered 2023-08-23: 500 [IU]

## 2023-08-23 NOTE — Progress Notes (Signed)
Treatment given per orders. Patient tolerated it well without problems. Vitals stable and discharged home from clinic ambulatory. Follow up as scheduled.  

## 2023-08-23 NOTE — Patient Instructions (Signed)
[facility] AT [facility]  Discharge Instructions: Thank you for choosing [facility] to provide your oncology and hematology care.  If you have a lab appointment with the Cancer Center - please note that after April 8th, 2024, all labs will be drawn in the cancer center.  You do not have to check in or register with the main entrance as you have in the past but will complete your check-in in the cancer center.  Wear comfortable clothing and clothing appropriate for easy access to any Portacath or PICC line.   We strive to give you quality time with your provider. You may need to reschedule your appointment if you arrive late (15 or more minutes).  Arriving late affects you and other patients whose appointments are after yours.  Also, if you miss three or more appointments without notifying the office, you may be dismissed from the clinic at the provider's discretion.      For prescription refill requests, have your pharmacy contact our office and allow 72 hours for refills to be completed.    Today you received the following chemotherapy and/or immunotherapy agents Kanjanti   To help prevent nausea and vomiting after your treatment, we encourage you to take your nausea medication as directed.  BELOW ARE SYMPTOMS THAT SHOULD BE REPORTED IMMEDIATELY: *FEVER GREATER THAN 100.4 F (38 C) OR HIGHER *CHILLS OR SWEATING *NAUSEA AND VOMITING THAT IS NOT CONTROLLED WITH YOUR NAUSEA MEDICATION *UNUSUAL SHORTNESS OF BREATH *UNUSUAL BRUISING OR BLEEDING *URINARY PROBLEMS (pain or burning when urinating, or frequent urination) *BOWEL PROBLEMS (unusual diarrhea, constipation, pain near the anus) TENDERNESS IN MOUTH AND THROAT WITH OR WITHOUT PRESENCE OF ULCERS (sore throat, sores in mouth, or a toothache) UNUSUAL RASH, SWELLING OR PAIN  UNUSUAL VAGINAL DISCHARGE OR ITCHING   Items with * indicate a potential emergency and should be followed up as soon as possible or go to the  Emergency Department if any problems should occur.  Please show the CHEMOTHERAPY ALERT CARD or IMMUNOTHERAPY ALERT CARD at check-in to the Emergency Department and triage nurse.  Should you have questions after your visit or need to cancel or reschedule your appointment, please contact [facility] CENTER AT [facility] [phone]  and follow the prompts.  Office hours are 8:00 a.m. to 4:30 p.m. Monday - Friday. Please note that voicemails left after 4:00 p.m. may not be returned until the following business day.  We are closed weekends and major holidays. You have access to a nurse at all times for urgent questions. Please call the main number to the clinic [phone] and follow the prompts.  For any non-urgent questions, you may also contact your provider using MyChart. We now offer e-Visits for anyone 63 and older to request care online for non-urgent symptoms. For details visit [email or web address].   Also download the MyChart app! Go to the app store, search "MyChart", open the app, select [place], and log in with your MyChart username and password.

## 2023-08-23 NOTE — Progress Notes (Signed)
Patient presents today for Kanjinti infusion per providers order.  Vital signs and labs within parameters for treatment. No new complaints at this time.

## 2023-09-13 ENCOUNTER — Inpatient Hospital Stay: Payer: PPO

## 2023-09-13 VITALS — BP 137/50 | HR 80 | Temp 96.7°F | Resp 18

## 2023-09-13 DIAGNOSIS — C50412 Malignant neoplasm of upper-outer quadrant of left female breast: Secondary | ICD-10-CM

## 2023-09-13 DIAGNOSIS — Z95828 Presence of other vascular implants and grafts: Secondary | ICD-10-CM

## 2023-09-13 DIAGNOSIS — Z5112 Encounter for antineoplastic immunotherapy: Secondary | ICD-10-CM | POA: Diagnosis not present

## 2023-09-13 LAB — COMPREHENSIVE METABOLIC PANEL
ALT: 19 U/L (ref 0–44)
AST: 16 U/L (ref 15–41)
Albumin: 3.7 g/dL (ref 3.5–5.0)
Alkaline Phosphatase: 65 U/L (ref 38–126)
Anion gap: 9 (ref 5–15)
BUN: 29 mg/dL — ABNORMAL HIGH (ref 8–23)
CO2: 25 mmol/L (ref 22–32)
Calcium: 9.7 mg/dL (ref 8.9–10.3)
Chloride: 105 mmol/L (ref 98–111)
Creatinine, Ser: 1.09 mg/dL — ABNORMAL HIGH (ref 0.44–1.00)
GFR, Estimated: 52 mL/min — ABNORMAL LOW (ref >60)
Glucose, Bld: 90 mg/dL (ref 70–99)
Potassium: 4.2 mmol/L (ref 3.5–5.1)
Sodium: 139 mmol/L (ref 135–145)
Total Bilirubin: 0.6 mg/dL (ref 0.3–1.2)
Total Protein: 6.6 g/dL (ref 6.5–8.1)

## 2023-09-13 LAB — CBC WITH DIFFERENTIAL/PLATELET
Abs Immature Granulocytes: 0.03 10*3/uL (ref 0.00–0.07)
Basophils Absolute: 0.1 10*3/uL (ref 0.0–0.1)
Basophils Relative: 1 %
Eosinophils Absolute: 0.3 10*3/uL (ref 0.0–0.5)
Eosinophils Relative: 3 %
HCT: 37.3 % (ref 36.0–46.0)
Hemoglobin: 11.8 g/dL — ABNORMAL LOW (ref 12.0–15.0)
Immature Granulocytes: 0 %
Lymphocytes Relative: 16 %
Lymphs Abs: 1.7 10*3/uL (ref 0.7–4.0)
MCH: 29.5 pg (ref 26.0–34.0)
MCHC: 31.6 g/dL (ref 30.0–36.0)
MCV: 93.3 fL (ref 80.0–100.0)
Monocytes Absolute: 0.9 10*3/uL (ref 0.1–1.0)
Monocytes Relative: 8 %
Neutro Abs: 7.7 10*3/uL (ref 1.7–7.7)
Neutrophils Relative %: 72 %
Platelets: 292 10*3/uL (ref 150–400)
RBC: 4 MIL/uL (ref 3.87–5.11)
RDW: 12.5 % (ref 11.5–15.5)
WBC: 10.6 10*3/uL — ABNORMAL HIGH (ref 4.0–10.5)
nRBC: 0 % (ref 0.0–0.2)

## 2023-09-13 LAB — TSH: TSH: 1.131 u[IU]/mL (ref 0.350–4.500)

## 2023-09-13 LAB — MAGNESIUM: Magnesium: 2.2 mg/dL (ref 1.7–2.4)

## 2023-09-13 MED ORDER — SODIUM CHLORIDE 0.9 % IV SOLN: Freq: Once | INTRAVENOUS | Status: AC

## 2023-09-13 MED ORDER — SODIUM CHLORIDE 0.9% FLUSH
10.0000 mL | Freq: Once | INTRAVENOUS | Status: AC
  Administered 2023-09-13: 10 mL via INTRAVENOUS

## 2023-09-13 MED ORDER — HEPARIN SOD (PORK) LOCK FLUSH 100 UNIT/ML IV SOLN
500.0000 [IU] | Freq: Once | INTRAVENOUS | Status: AC | PRN
Start: 2023-09-13 — End: 2023-09-13
  Administered 2023-09-13: 500 [IU]

## 2023-09-13 MED ORDER — TRASTUZUMAB-ANNS CHEMO 420 MG IV SOLR
6.0000 mg/kg | Freq: Once | INTRAVENOUS | Status: AC
Start: 2023-09-13 — End: 2023-09-13
  Administered 2023-09-13: 504 mg via INTRAVENOUS
  Filled 2023-09-13: qty 24

## 2023-09-13 MED ORDER — SODIUM CHLORIDE 0.9% FLUSH
10.0000 mL | INTRAVENOUS | Status: DC | PRN
  Administered 2023-09-13: 10 mL

## 2023-09-13 MED ORDER — ACETAMINOPHEN 325 MG PO TABS
650.0000 mg | ORAL_TABLET | Freq: Once | ORAL | Status: AC
  Administered 2023-09-13: 650 mg via ORAL
  Filled 2023-09-13: qty 2

## 2023-09-13 MED ORDER — CETIRIZINE HCL 10 MG/ML IV SOLN
10.0000 mg | Freq: Once | INTRAVENOUS | Status: AC
  Administered 2023-09-13: 10 mg via INTRAVENOUS
  Filled 2023-09-13: qty 1

## 2023-09-13 NOTE — Patient Instructions (Signed)
[facility]  Discharge Instructions: Thank you for choosing [facility] to provide your oncology and hematology care.  If you have a lab appointment with the Cancer Center - please note that after April 8th, 2024, all labs will be drawn in the cancer center.  You do not have to check in or register with the main entrance as you have in the past but will complete your check-in in the cancer center.  Wear comfortable clothing and clothing appropriate for easy access to any Portacath or PICC line.   We strive to give you quality time with your provider. You may need to reschedule your appointment if you arrive late (15 or more minutes).  Arriving late affects you and other patients whose appointments are after yours.  Also, if you miss three or more appointments without notifying the office, you may be dismissed from the clinic at the provider's discretion.      For prescription refill requests, have your pharmacy contact our office and allow 72 hours for refills to be completed.    Today you received the following chemotherapy and/or immunotherapy agents Kanjinti, return as scheduled.   To help prevent nausea and vomiting after your treatment, we encourage you to take your nausea medication as directed.  BELOW ARE SYMPTOMS THAT SHOULD BE REPORTED IMMEDIATELY: *FEVER GREATER THAN 100.4 F (38 C) OR HIGHER *CHILLS OR SWEATING *NAUSEA AND VOMITING THAT IS NOT CONTROLLED WITH YOUR NAUSEA MEDICATION *UNUSUAL SHORTNESS OF BREATH *UNUSUAL BRUISING OR BLEEDING *URINARY PROBLEMS (pain or burning when urinating, or frequent urination) *BOWEL PROBLEMS (unusual diarrhea, constipation, pain near the anus) TENDERNESS IN MOUTH AND THROAT WITH OR WITHOUT PRESENCE OF ULCERS (sore throat, sores in mouth, or a toothache) UNUSUAL RASH, SWELLING OR PAIN  UNUSUAL VAGINAL DISCHARGE OR ITCHING   Items with * indicate a potential emergency and should be followed up as soon as  possible or go to the Emergency Department if any problems should occur.  Please show the CHEMOTHERAPY ALERT CARD or IMMUNOTHERAPY ALERT CARD at check-in to the Emergency Department and triage nurse.  Should you have questions after your visit or need to cancel or reschedule your appointment, please contact [facility] CENTER AT [facility] [phone]  and follow the prompts.  Office hours are 8:00 a.m. to 4:30 p.m. Monday - Friday. Please note that voicemails left after 4:00 p.m. may not be returned until the following business day.  We are closed weekends and major holidays. You have access to a nurse at all times for urgent questions. Please call the main number to the clinic [phone] and follow the prompts.  For any non-urgent questions, you may also contact your provider using MyChart. We now offer e-Visits for anyone 67 and older to request care online for non-urgent symptoms. For details visit [email or web address].   Also download the MyChart app! Go to the app store, search "MyChart", open the app, select [place], and log in with your MyChart username and password.

## 2023-09-13 NOTE — Progress Notes (Signed)
Patient tolerated chemotherapy with no complaints voiced. Side effects with management reviewed understanding verbalized. Port site clean and dry with no bruising or swelling noted at site. Good blood return noted before and after administration of chemotherapy. Band aid applied. Patient left in satisfactory condition with VSS and no s/s of distress noted. 

## 2023-10-03 ENCOUNTER — Other Ambulatory Visit: Payer: Self-pay

## 2023-10-04 ENCOUNTER — Inpatient Hospital Stay: Payer: PPO

## 2023-10-04 ENCOUNTER — Inpatient Hospital Stay: Payer: PPO | Attending: Hematology

## 2023-10-04 ENCOUNTER — Inpatient Hospital Stay: Payer: PPO | Admitting: Hematology

## 2023-10-04 VITALS — BP 120/51 | HR 69 | Temp 98.0°F | Resp 18 | Wt 190.5 lb

## 2023-10-04 DIAGNOSIS — Z170 Estrogen receptor positive status [ER+]: Secondary | ICD-10-CM | POA: Diagnosis not present

## 2023-10-04 DIAGNOSIS — C50412 Malignant neoplasm of upper-outer quadrant of left female breast: Secondary | ICD-10-CM | POA: Diagnosis not present

## 2023-10-04 DIAGNOSIS — Z5112 Encounter for antineoplastic immunotherapy: Secondary | ICD-10-CM | POA: Insufficient documentation

## 2023-10-04 DIAGNOSIS — Z79620 Long term (current) use of immunosuppressive biologic: Secondary | ICD-10-CM | POA: Diagnosis not present

## 2023-10-04 DIAGNOSIS — Z95828 Presence of other vascular implants and grafts: Secondary | ICD-10-CM

## 2023-10-04 LAB — CBC WITH DIFFERENTIAL/PLATELET
Abs Immature Granulocytes: 0.02 10*3/uL (ref 0.00–0.07)
Basophils Absolute: 0.1 10*3/uL (ref 0.0–0.1)
Basophils Relative: 1 %
Eosinophils Absolute: 0.2 10*3/uL (ref 0.0–0.5)
Eosinophils Relative: 3 %
HCT: 37.2 % (ref 36.0–46.0)
Hemoglobin: 11.9 g/dL — ABNORMAL LOW (ref 12.0–15.0)
Immature Granulocytes: 0 %
Lymphocytes Relative: 18 %
Lymphs Abs: 1.8 10*3/uL (ref 0.7–4.0)
MCH: 29.5 pg (ref 26.0–34.0)
MCHC: 32 g/dL (ref 30.0–36.0)
MCV: 92.3 fL (ref 80.0–100.0)
Monocytes Absolute: 0.8 10*3/uL (ref 0.1–1.0)
Monocytes Relative: 9 %
Neutro Abs: 6.7 10*3/uL (ref 1.7–7.7)
Neutrophils Relative %: 69 %
Platelets: 299 10*3/uL (ref 150–400)
RBC: 4.03 MIL/uL (ref 3.87–5.11)
RDW: 12.4 % (ref 11.5–15.5)
WBC: 9.6 10*3/uL (ref 4.0–10.5)
nRBC: 0 % (ref 0.0–0.2)

## 2023-10-04 LAB — COMPREHENSIVE METABOLIC PANEL
ALT: 18 U/L (ref 0–44)
AST: 16 U/L (ref 15–41)
Albumin: 3.7 g/dL (ref 3.5–5.0)
Alkaline Phosphatase: 62 U/L (ref 38–126)
Anion gap: 9 (ref 5–15)
BUN: 28 mg/dL — ABNORMAL HIGH (ref 8–23)
CO2: 26 mmol/L (ref 22–32)
Calcium: 9.3 mg/dL (ref 8.9–10.3)
Chloride: 102 mmol/L (ref 98–111)
Creatinine, Ser: 1.29 mg/dL — ABNORMAL HIGH (ref 0.44–1.00)
GFR, Estimated: 43 mL/min — ABNORMAL LOW (ref >60)
Glucose, Bld: 96 mg/dL (ref 70–99)
Potassium: 4 mmol/L (ref 3.5–5.1)
Sodium: 137 mmol/L (ref 135–145)
Total Bilirubin: 0.6 mg/dL (ref <1.2)
Total Protein: 6.6 g/dL (ref 6.5–8.1)

## 2023-10-04 LAB — TSH: TSH: 1.286 u[IU]/mL (ref 0.350–4.500)

## 2023-10-04 LAB — MAGNESIUM: Magnesium: 2.1 mg/dL (ref 1.7–2.4)

## 2023-10-04 MED ORDER — HEPARIN SOD (PORK) LOCK FLUSH 100 UNIT/ML IV SOLN
500.0000 [IU] | Freq: Once | INTRAVENOUS | Status: AC | PRN
  Administered 2023-10-04: 500 [IU]

## 2023-10-04 MED ORDER — TRASTUZUMAB-ANNS CHEMO 150 MG IV SOLR
6.0000 mg/kg | Freq: Once | INTRAVENOUS | Status: AC
  Administered 2023-10-04: 504 mg via INTRAVENOUS
  Filled 2023-10-04: qty 24

## 2023-10-04 MED ORDER — SODIUM CHLORIDE 0.9 % IV SOLN: Freq: Once | INTRAVENOUS | Status: AC

## 2023-10-04 MED ORDER — CETIRIZINE HCL 10 MG/ML IV SOLN
10.0000 mg | Freq: Once | INTRAVENOUS | Status: AC
  Administered 2023-10-04: 10 mg via INTRAVENOUS
  Filled 2023-10-04: qty 1

## 2023-10-04 MED ORDER — SODIUM CHLORIDE 0.9% FLUSH
10.0000 mL | INTRAVENOUS | Status: DC | PRN
  Administered 2023-10-04: 10 mL

## 2023-10-04 MED ORDER — ACETAMINOPHEN 325 MG PO TABS
650.0000 mg | ORAL_TABLET | Freq: Once | ORAL | Status: AC
  Administered 2023-10-04: 650 mg via ORAL
  Filled 2023-10-04: qty 2

## 2023-10-04 NOTE — Progress Notes (Unsigned)
Haley Peterson presented for Portacath access and flush.  Portacath located right chest wall accessed with  H 20 needle.  Good blood return present. Portacath flushed with 20ml NS and saline locked.  Procedure tolerated well and without incident.

## 2023-10-04 NOTE — Progress Notes (Signed)
Patient tolerated therapy with no complaints voiced.  Side effects with management reviewed with understanding verbalized.  Port site clean and dry with no bruising or swelling noted at site.  Good blood return noted before and after administration of therapy.  Band aid applied.  Patient left in satisfactory condition with VSS and no s/s of distress noted.  

## 2023-10-05 ENCOUNTER — Encounter: Payer: Self-pay | Admitting: Hematology

## 2023-10-09 ENCOUNTER — Other Ambulatory Visit: Payer: Self-pay | Admitting: Hematology

## 2023-10-13 ENCOUNTER — Other Ambulatory Visit: Payer: Self-pay

## 2023-10-24 ENCOUNTER — Other Ambulatory Visit: Payer: Self-pay

## 2023-10-25 ENCOUNTER — Inpatient Hospital Stay: Payer: PPO

## 2023-10-25 ENCOUNTER — Inpatient Hospital Stay: Payer: PPO | Attending: Hematology

## 2023-10-25 ENCOUNTER — Inpatient Hospital Stay (HOSPITAL_BASED_OUTPATIENT_CLINIC_OR_DEPARTMENT_OTHER): Payer: PPO | Admitting: Hematology

## 2023-10-25 VITALS — BP 138/46 | HR 73 | Temp 97.0°F | Resp 18

## 2023-10-25 VITALS — BP 152/70 | Wt 192.6 lb

## 2023-10-25 DIAGNOSIS — Z170 Estrogen receptor positive status [ER+]: Secondary | ICD-10-CM | POA: Diagnosis not present

## 2023-10-25 DIAGNOSIS — Z87891 Personal history of nicotine dependence: Secondary | ICD-10-CM | POA: Diagnosis not present

## 2023-10-25 DIAGNOSIS — C50412 Malignant neoplasm of upper-outer quadrant of left female breast: Secondary | ICD-10-CM

## 2023-10-25 DIAGNOSIS — Z5112 Encounter for antineoplastic immunotherapy: Secondary | ICD-10-CM | POA: Insufficient documentation

## 2023-10-25 DIAGNOSIS — Z79899 Other long term (current) drug therapy: Secondary | ICD-10-CM | POA: Insufficient documentation

## 2023-10-25 DIAGNOSIS — Z79811 Long term (current) use of aromatase inhibitors: Secondary | ICD-10-CM | POA: Insufficient documentation

## 2023-10-25 DIAGNOSIS — Z95828 Presence of other vascular implants and grafts: Secondary | ICD-10-CM

## 2023-10-25 LAB — COMPREHENSIVE METABOLIC PANEL
ALT: 16 U/L (ref 0–44)
AST: 14 U/L — ABNORMAL LOW (ref 15–41)
Albumin: 3.6 g/dL (ref 3.5–5.0)
Alkaline Phosphatase: 66 U/L (ref 38–126)
Anion gap: 9 (ref 5–15)
BUN: 30 mg/dL — ABNORMAL HIGH (ref 8–23)
CO2: 25 mmol/L (ref 22–32)
Calcium: 9.7 mg/dL (ref 8.9–10.3)
Chloride: 103 mmol/L (ref 98–111)
Creatinine, Ser: 1.13 mg/dL — ABNORMAL HIGH (ref 0.44–1.00)
GFR, Estimated: 50 mL/min — ABNORMAL LOW (ref >60)
Glucose, Bld: 94 mg/dL (ref 70–99)
Potassium: 4 mmol/L (ref 3.5–5.1)
Sodium: 137 mmol/L (ref 135–145)
Total Bilirubin: 0.6 mg/dL (ref <1.2)
Total Protein: 6.8 g/dL (ref 6.5–8.1)

## 2023-10-25 LAB — CBC WITH DIFFERENTIAL/PLATELET
Abs Immature Granulocytes: 0.03 10*3/uL (ref 0.00–0.07)
Basophils Absolute: 0.1 10*3/uL (ref 0.0–0.1)
Basophils Relative: 1 %
Eosinophils Absolute: 0.3 10*3/uL (ref 0.0–0.5)
Eosinophils Relative: 4 %
HCT: 37.6 % (ref 36.0–46.0)
Hemoglobin: 12 g/dL (ref 12.0–15.0)
Immature Granulocytes: 0 %
Lymphocytes Relative: 22 %
Lymphs Abs: 1.7 10*3/uL (ref 0.7–4.0)
MCH: 29.2 pg (ref 26.0–34.0)
MCHC: 31.9 g/dL (ref 30.0–36.0)
MCV: 91.5 fL (ref 80.0–100.0)
Monocytes Absolute: 0.7 10*3/uL (ref 0.1–1.0)
Monocytes Relative: 9 %
Neutro Abs: 5.1 10*3/uL (ref 1.7–7.7)
Neutrophils Relative %: 64 %
Platelets: 307 10*3/uL (ref 150–400)
RBC: 4.11 MIL/uL (ref 3.87–5.11)
RDW: 12.2 % (ref 11.5–15.5)
WBC: 7.8 10*3/uL (ref 4.0–10.5)
nRBC: 0 % (ref 0.0–0.2)

## 2023-10-25 LAB — MAGNESIUM: Magnesium: 2.1 mg/dL (ref 1.7–2.4)

## 2023-10-25 MED ORDER — SODIUM CHLORIDE 0.9% FLUSH
10.0000 mL | INTRAVENOUS | Status: DC | PRN
  Administered 2023-10-25: 10 mL via INTRAVENOUS

## 2023-10-25 MED ORDER — HEPARIN SOD (PORK) LOCK FLUSH 100 UNIT/ML IV SOLN
500.0000 [IU] | Freq: Once | INTRAVENOUS | Status: AC | PRN
Start: 2023-10-25 — End: 2023-10-25
  Administered 2023-10-25: 500 [IU]

## 2023-10-25 MED ORDER — CETIRIZINE HCL 10 MG/ML IV SOLN
10.0000 mg | Freq: Once | INTRAVENOUS | Status: AC
  Administered 2023-10-25: 10 mg via INTRAVENOUS
  Filled 2023-10-25: qty 1

## 2023-10-25 MED ORDER — ACETAMINOPHEN 325 MG PO TABS
650.0000 mg | ORAL_TABLET | Freq: Once | ORAL | Status: AC
  Administered 2023-10-25: 650 mg via ORAL
  Filled 2023-10-25: qty 2

## 2023-10-25 MED ORDER — SODIUM CHLORIDE 0.9% FLUSH
10.0000 mL | INTRAVENOUS | Status: DC | PRN
  Administered 2023-10-25: 10 mL

## 2023-10-25 MED ORDER — TRASTUZUMAB-ANNS CHEMO 150 MG IV SOLR
6.0000 mg/kg | Freq: Once | INTRAVENOUS | Status: AC
  Administered 2023-10-25: 504 mg via INTRAVENOUS
  Filled 2023-10-25: qty 24

## 2023-10-25 MED ORDER — SODIUM CHLORIDE 0.9 % IV SOLN
Freq: Once | INTRAVENOUS | Status: AC
Start: 2023-10-25 — End: 2023-10-25

## 2023-10-25 NOTE — Patient Instructions (Signed)
Haley Peterson at [facility] Discharge Instructions   You were seen and examined today by Dr. Delton Coombes.  He reviewed the results of your lab work which are normal/stable.   We will proceed with your treatment today.   Continue anastrozole as prescribed.   Return as scheduled.    Thank you for choosing [place] at [facility] to provide your oncology and hematology care.  To afford each patient quality time with our provider, please arrive at least 15 minutes before your scheduled appointment time.   If you have a lab appointment with the [place] please come in thru the Main Entrance and check in at the main information desk.  You need to re-schedule your appointment should you arrive 10 or more minutes late.  We strive to give you quality time with our providers, and arriving late affects you and other patients whose appointments are after yours.  Also, if you no show three or more times for appointments you may be dismissed from the clinic at the providers discretion.     Again, thank you for choosing [facility].  Our hope is that these requests will decrease the amount of time that you wait before being seen by our physicians.       _____________________________________________________________  Should you have questions after your visit to [facility], please contact our office at [phone] and follow the prompts.  Our office hours are 8:00 a.m. and 4:30 p.m. Monday - Friday.  Please note that voicemails left after 4:00 p.m. may not be returned until the following business day.  We are closed weekends and major holidays.  You do have access to a nurse 24-7, just call the main number to the clinic [phone] and do not press any options, hold on the line and a nurse will answer the phone.    For prescription refill requests, have your pharmacy contact our office and allow 72 hours.    Due to Covid, you  will need to wear a mask upon entering the hospital. If you do not have a mask, a mask will be given to you at the Main Entrance upon arrival. For doctor visits, patients may have 1 support person age 59 or older with them. For treatment visits, patients can not have anyone with them due to social distancing guidelines and our immunocompromised population.

## 2023-10-25 NOTE — Patient Instructions (Signed)
[facility] [place] - A DEPT OF [facility]  Discharge Instructions: Thank you for choosing [facility] to provide your oncology and hematology care.  If you have a lab appointment with the Cancer Center - please note that after April 8th, 2024, all labs will be drawn in the cancer center.  You do not have to check in or register with the main entrance as you have in the past but will complete your check-in in the cancer center.  Wear comfortable clothing and clothing appropriate for easy access to any Portacath or PICC line.   We strive to give you quality time with your provider. You may need to reschedule your appointment if you arrive late (15 or more minutes).  Arriving late affects you and other patients whose appointments are after yours.  Also, if you miss three or more appointments without notifying the office, you may be dismissed from the clinic at the provider's discretion.      For prescription refill requests, have your pharmacy contact our office and allow 72 hours for refills to be completed.    Today you received the following chemotherapy and/or immunotherapy agents Kanjinti      To help prevent nausea and vomiting after your treatment, we encourage you to take your nausea medication as directed.  BELOW ARE SYMPTOMS THAT SHOULD BE REPORTED IMMEDIATELY: *FEVER GREATER THAN 100.4 F (38 C) OR HIGHER *CHILLS OR SWEATING *NAUSEA AND VOMITING THAT IS NOT CONTROLLED WITH YOUR NAUSEA MEDICATION *UNUSUAL SHORTNESS OF BREATH *UNUSUAL BRUISING OR BLEEDING *URINARY PROBLEMS (pain or burning when urinating, or frequent urination) *BOWEL PROBLEMS (unusual diarrhea, constipation, pain near the anus) TENDERNESS IN MOUTH AND THROAT WITH OR WITHOUT PRESENCE OF ULCERS (sore throat, sores in mouth, or a toothache) UNUSUAL RASH, SWELLING OR PAIN  UNUSUAL VAGINAL DISCHARGE OR ITCHING   Items with * indicate a potential emergency and should be followed up  as soon as possible or go to the Emergency Department if any problems should occur.  Please show the CHEMOTHERAPY ALERT CARD or IMMUNOTHERAPY ALERT CARD at check-in to the Emergency Department and triage nurse.  Should you have questions after your visit or need to cancel or reschedule your appointment, please contact [facility]CER CTR [place] - A DEPT OF [facility] [phone]  and follow the prompts.  Office hours are 8:00 a.m. to 4:30 p.m. Monday - Friday. Please note that voicemails left after 4:00 p.m. may not be returned until the following business day.  We are closed weekends and major holidays. You have access to a nurse at all times for urgent questions. Please call the main number to the clinic [phone] and follow the prompts.  For any non-urgent questions, you may also contact your provider using MyChart. We now offer e-Visits for anyone 41 and older to request care online for non-urgent symptoms. For details visit [email or web address].   Also download the MyChart app! Go to the app store, search "MyChart", open the app, select [place], and log in with your MyChart username and password.

## 2023-10-25 NOTE — Progress Notes (Signed)
Patient presents today for Kanjinti infusion per providers order.  Vital signs and labs reviewed by MD.  Message received from Chapman Moss RN/Dr. Ellin Saba patient okay for treatment.  Treatment given today per MD orders.  Tolerated infusion without adverse affects.  Vital signs stable.  No complaints at this time.  Discharge from clinic ambulatory in stable condition.  Alert and oriented X 3.  Follow up with [facility] as scheduled.

## 2023-10-25 NOTE — Progress Notes (Signed)
Patient has been examined by Dr. Katragadda. Vital signs and labs have been reviewed by MD - ANC, Creatinine, LFTs, hemoglobin, and platelets are within treatment parameters per M.D. - pt may proceed with treatment.  Primary RN and pharmacy notified.  

## 2023-10-25 NOTE — Progress Notes (Signed)
[facility] [address], [place] [address]    Clinic Day:  10/25/2023  Referring physician: Benita Stabile, MD  Patient Care Team: Benita Stabile, MD as PCP - General (Internal Medicine) Patrica Duel, MD (Inactive) (Internal Medicine) Cyndia Bent, MD (Inactive) as Surgeon (General Surgery) Doreatha Massed, MD as Medical Oncologist (Medical Oncology) Therese Sarah, RN as Oncology Nurse Navigator (Medical Oncology)   ASSESSMENT & PLAN:   Assessment: 1.  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  In the 2:00 location 3 cm from the nipple, mass measures 0.8 x 0.9 x 0.9 cm.  Measured together, these lesions span 2.4 cm in the 2 o'clock position.  Left axilla is negative for adenopathy. - Biopsy (09/22/2022): 1. left breast 2:00, 1 cmfn-invasive ductal carcinoma, grade 2, ER 100%, PR 95%, Ki-67 20%, HER2 2+ by IHC, HER2 FISH positive with ratio of 2.45 2.  Left breast 2:00 3 cmfn-invasive ductal carcinoma, grade 2, ER/PR 100%, Ki-67 15%, HER2 2+ by IHC, FISH negative - Left mastectomy, SLNB by Dr. Dwain Sarna on 10/31/2022 - Pathology: 2.5 x 2.1 x 1.2 cm invasive moderately differentiated adenocarcinoma, grade 2, margins free, extensive lymphatic invasion.  0/1 lymph node positive.  Associated focal intermediate grade DCIS. - Adjuvant chemotherapy with weekly paclitaxel and Herceptin started on 12/13/2022, 12 cycles of Taxol completed on 02/28/2023.  1 year Herceptin completed on 12/06/2023. - Anastrozole started on 03/20/2023.   2.  Social/family history: - Lives at home by herself and is independent of ADLs and IADLs.  She worked as an Software engineer.  Quit smoking 40 years ago. - Brother had thyroid cancer.   3.   Stage I right breast cancer: - Diagnosed on 05/15/2006, right breast 11 o'clock position - Right mastectomy (08/15/2006): 1.3 cm IDC, ER 83% positive, PR 84% positive, HER2 negative, Ki-67 4% - Tamoxifen followed by exemestane followed by letrozole for 10 years.   4.  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  Osteopenia: - Last bone density on 02/03/2021: T-score -1.9. - DEXA scan (04/26/2023) T-score -2.0    Plan: 1.  Stage Ia (T2 N0) HER2 positive left breast cancer: - She is tolerating anastrozole reasonably well.  Mild hot flashes present.  No arthralgias. - She is tolerating Herceptin reasonably well.  Denies any PND or orthopnea. - Reviewed labs from today: Normal LFTs and electrolytes.  CBC grossly normal.  TSH is 1.2. - Recommend continuing Herceptin every 3 weeks for 3 more doses.  She will finish 1 year of Herceptin in end of January 2025.  She will continue anastrozole daily.  I will see her back in 6 months for follow-up.   2.  High risk drug monitoring: - 2D echo on 04/26/2023: EF 65 to 70%.  Repeat echo on 08/09/2023: EF 60 to 65%. - Will plan on repeating another echocardiogram end of this month.   3.  Peripheral neuropathy: - Tingling in the lower legs and feet from paclitaxel is stable.  No medical intervention needed.   4.  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 - Last Feraheme infusion on 03/20/2023.  Hemoglobin is 12.   5.  Osteopenia (DEXA 04/26/2023  T-score -2.0): - Continue calcium and vitamin D supplements.  Will check vitamin D level at next visit.    Orders Placed This Encounter  Procedures   ECHOCARDIOGRAM COMPLETE    Standing Status:   Future    Standing Expiration Date:   10/24/2024    Order Specific Question:   Where should this test be performed    Answer:   [facility]    Order Specific Question:   Perflutren DEFINITY (image enhancing agent) should be administered unless  hypersensitivity or allergy exist    Answer:   Administer Perflutren    Order Specific Question:   Reason for exam-Echo    Answer:   Chemo  Z09      I,Katie Daubenspeck,acting as a scribe for Doreatha Massed, MD.,have documented all relevant documentation on the behalf of Doreatha Massed, MD,as directed by  Doreatha Massed, MD while in the presence of Doreatha Massed, MD.   I, Doreatha Massed MD, have reviewed the above documentation for accuracy and completeness, and I agree with the above.   Doreatha Massed, MD   12/11/20244:16 PM  CHIEF COMPLAINT:   Diagnosis: stage Ib HER2 positive left breast cancer    Cancer Staging  Breast cancer of upper-outer quadrant of left female breast [facility]) Staging form: Breast, AJCC 8th Edition - Clinical stage from 10/25/2022: Stage IB (cT2, cN0, cM0, G2, ER+, PR+, HER2+) - Unsigned    Prior Therapy: 1. Left mastectomy and SLNB by Dr. Dwain Sarna on 10/31/2022  2. Trastuzumab and Herceptin 12/13/22 - 02/28/23  Current Therapy:  Herceptin and anastrozole    HISTORY OF PRESENT ILLNESS:   Oncology History  Breast cancer of upper-outer quadrant of left female breast (HCC)  10/25/2022 Initial Diagnosis   Breast cancer of upper-outer quadrant of left female breast (HCC)   12/13/2022 -  Chemotherapy   Patient is on Treatment Plan : BREAST Paclitaxel + Trastuzumab q7d / Trastuzumab q21d        INTERVAL HISTORY:   Haley Peterson is a 77 y.o. female presenting to clinic today for follow up of stage Ib HER2 positive left breast cancer. She was last seen by me on 07/12/23.  Today, she states that she is doing well overall. Her appetite level is at 100%. Her energy level is at 100%.  PAST MEDICAL HISTORY:   Past Medical History: Past Medical History:  Diagnosis Date   Arthritis    Bone spur    left heel   Breast cancer (HCC)    bilateral   Cancer (HCC)    Bilateral Breast cancer   Cataract    right eye    CKD (chronic kidney  disease), stage III (HCC)    mgd by PCP    History of breast cancer    left, right   Hypertension    Personal history of radiation therapy    Pneumonia 10/2012   Port-A-Cath in place 12/06/2022    Surgical History: Past Surgical History:  Procedure Laterality Date   ABDOMINAL HYSTERECTOMY     BIOPSY  04/12/2017   Procedure: BIOPSY;  Surgeon: Malissa Hippo, MD;  [facility];  Service: Endoscopy;;  ascending colon ulcer biopsies   BREAST BIOPSY Left 09/22/2022   [place] LT BREAST BX W LOC DEV EA ADD LESION IMG BX SPEC US GUIDE 09/22/2022 [facility]   BREAST BIOPSY Left 09/22/2022   [place] LT BREAST BX W LOC DEV 1ST LESION IMG BX SPEC US GUIDE 09/22/2022 [facility]   BREAST LUMPECTOMY  08/28/2001   left   BREAST RECONSTRUCTION Right    implant   CHOLECYSTECTOMY  2002   COLONOSCOPY     COLONOSCOPY N/A 04/12/2017   Procedure: COLONOSCOPY;  Surgeon: Malissa Hippo, MD;  [facility];  Service: Endoscopy;  Laterality: N/A;  830   MASTECTOMY  08/15/2006   right   PARTIAL HYSTERECTOMY  1985   PORTACATH PLACEMENT Right 10/31/2022   Procedure: INSERTION PORT-A-CATH;  Surgeon: Emelia Loron, MD;  Location: [facility];  Service: General;  Laterality: Right;   REPLACEMENT TOTAL KNEE  2009, 2010   right-2010, left 2009   SIMPLE MASTECTOMY WITH AXILLARY SENTINEL NODE BIOPSY Left 10/31/2022   Procedure: LEFT MASTECTOMY;  Surgeon: Emelia Loron, MD;  Location: [facility];  Service: General;  Laterality: Left;  90 MIN ROOM 2   TISSUE EXPANDER PLACEMENT     TOTAL HIP ARTHROPLASTY Right 10/09/2017   Procedure: RIGHT TOTAL HIP ARTHROPLASTY ANTERIOR APPROACH;  Surgeon: Gean Birchwood, MD;  Location: [facility];  Service: Orthopedics;  Laterality: Right;   TOTAL HIP ARTHROPLASTY Left 09/23/2019   Procedure: Left Anterior Hip Arthroplasty;  Surgeon: Gean Birchwood, MD;  Location: [facility];  Service: Orthopedics;  Laterality: Left;    Social  History: Social History   Socioeconomic History   Marital status: Divorced    Spouse name: Not on file   Number of children: Not on file   Years of education: Not on file   Highest education level: Not on file  Occupational History   Not on file  Tobacco Use   Smoking status: Former    Current packs/day: 0.00    Average packs/day: 0.5 packs/day for 5.0 years (2.5 ttl pk-yrs)    Types: Cigarettes    Start date: 09/27/1978    Quit date: 09/28/1983    Years since quitting: 40.1   Smokeless tobacco: Never  Vaping Use   Vaping status: Never Used  Substance and Sexual Activity   Alcohol use: No   Drug use: No   Sexual activity: Not on file  Other Topics Concern   Not on file  Social History Narrative   Not on file   Social Determinants of Health   Financial Resource Strain: Not on file  Food Insecurity: Not on file  Transportation Needs: Not on file  Physical Activity: Not on file  Stress: Not on file  Social Connections: Not on file  Intimate Partner Violence: Not on file    Family History: Family History  Problem Relation Age of Onset   Arthritis Mother    Thyroid cancer Brother        2010   Colon cancer Neg Hx    Breast cancer Neg Hx     Current Medications:  Current Outpatient Medications:    anastrozole (ARIMIDEX) 1 MG tablet, TAKE ONE TABLET BY MOUTH ONCE DAILY., Disp: 30 tablet, Rfl: 6   Ascorbic Acid (VITAMIN C) 1000 MG tablet, Take 1,000 mg by mouth daily with lunch., Disp: , Rfl:    Calcium-Vitamin D (CALTRATE 600 PLUS-VIT D PO), Take 1 tablet by mouth daily with lunch. , Disp: , Rfl:    chlorhexidine (PERIDEX) 0.12 % solution, Rinse 1/2 oz. twiceYdaily, after breakfast and before bedtime, Disp: , Rfl:    Cholecalciferol (D-3-5) 125 MCG (5000 UT) capsule, Take 5,000 Units by mouth daily., Disp: , Rfl:    cholecalciferol (VITAMIN D) 1000 UNITS tablet, Take 1,000 Units by mouth daily with lunch. , Disp: , Rfl:  Garlic (GARLIQUE PO), Take 1 tablet by  mouth daily with lunch., Disp: , Rfl:    lidocaine-prilocaine (EMLA) cream, Apply a quarter sized amount to port a cath site and cover with plastic wrap 1 hour prior to infusion appointments, Disp: 30 g, Rfl: 3   losartan (COZAAR) 100 MG tablet, Take 100 mg by mouth daily., Disp: , Rfl:    NON FORMULARY, Balance of Nature - 3 fruits and 3 Vegetables, Disp: , Rfl:    Omega-3 Fatty Acids (FISH OIL) 1200 MG CPDR, Take by mouth., Disp: , Rfl:    prochlorperazine (COMPAZINE) 10 MG tablet, Take 1 tablet (10 mg total) by mouth every 6 (six) hours as needed for nausea or vomiting., Disp: 60 tablet, Rfl: 3   TRASTUZUMAB IV, Inject into the vein once a week. Weekly x 12 weeks, then every 21 days, Disp: , Rfl:    TURMERIC PO, Take 3 capsules by mouth daily with lunch., Disp: , Rfl:    vitamin B-12 (CYANOCOBALAMIN) 100 MCG tablet, Take 100 mcg by mouth daily., Disp: , Rfl:  No current facility-administered medications for this visit.  Facility-Administered Medications Ordered in Other Visits:    sodium chloride flush (NS) 0.9 % injection 10 mL, 10 mL, Intracatheter, PRN, Doreatha Massed, MD, 10 mL at 02/07/23 1359   sodium chloride flush (NS) 0.9 % injection 10 mL, 10 mL, Intracatheter, PRN, Doreatha Massed, MD, 10 mL at 10/25/23 1549   Allergies: Allergies  Allergen Reactions   Penicillins Other (See Comments)    "LOSS OF VISION"  Has patient had a PCN reaction causing immediate rash, facial/tongue/throat swelling, SOB or lightheadedness with hypotension: No Has patient had a PCN reaction causing severe rash involving mucus membranes or skin necrosis: No Has patient had a PCN reaction that required hospitalization: No Has patient had a PCN reaction occurring within the last 10 years: No If all of the above answers are "NO", then may proceed with Cephalosporin use.    Oxytetracycline Other (See Comments)    Fever blisters   Tamoxifen Other (See Comments)    "GENERAL ILLNESS WITH  LIGHTHEADNESS"    Aromasin [Exemestane] Other (See Comments)    UNSPECIFIED REACTION  Pt is unsure of what reaction was...   Codeine Other (See Comments)    LIGHTHEADED    REVIEW OF SYSTEMS:   Review of Systems  Constitutional:  Negative for chills, fatigue and fever.  HENT:   Negative for lump/mass, mouth sores, nosebleeds, sore throat and trouble swallowing.   Eyes:  Negative for eye problems.  Respiratory:  Negative for cough and shortness of breath.   Cardiovascular:  Negative for chest pain, leg swelling and palpitations.  Gastrointestinal:  Negative for abdominal pain, constipation, diarrhea, nausea and vomiting.  Genitourinary:  Negative for bladder incontinence, difficulty urinating, dysuria, frequency, hematuria and nocturia.   Musculoskeletal:  Negative for arthralgias, back pain, flank pain, myalgias and neck pain.  Skin:  Negative for itching and rash.  Neurological:  Positive for numbness. Negative for dizziness and headaches.  Hematological:  Does not bruise/bleed easily.  Psychiatric/Behavioral:  Negative for depression, sleep disturbance and suicidal ideas. The patient is not nervous/anxious.   All other systems reviewed and are negative.    VITALS:   Blood pressure (!) 152/70, weight 192 lb 9.6 oz (87.4 kg).  Wt Readings from Last 3 Encounters:  10/25/23 192 lb 9.6 oz (87.4 kg)  10/04/23 190 lb 7.6 oz (86.4 kg)  09/13/23 190 lb (86.2 kg)    Body  mass index is 33.06 kg/m.  Performance status (ECOG): 1 - Symptomatic but completely ambulatory  PHYSICAL EXAM:   Physical Exam Vitals and nursing note reviewed. Exam conducted with a chaperone present.  Constitutional:      Appearance: Normal appearance.  Cardiovascular:     Rate and Rhythm: Normal rate and regular rhythm.     Pulses: Normal pulses.     Heart sounds: Normal heart sounds.  Pulmonary:     Effort: Pulmonary effort is normal.     Breath sounds: Normal breath sounds.  Abdominal:      Palpations: Abdomen is soft. There is no hepatomegaly, splenomegaly or mass.     Tenderness: There is no abdominal tenderness.  Musculoskeletal:     Right lower leg: No edema.     Left lower leg: No edema.  Lymphadenopathy:     Cervical: No cervical adenopathy.     Right cervical: No superficial, deep or posterior cervical adenopathy.    Left cervical: No superficial, deep or posterior cervical adenopathy.     Upper Body:     Right upper body: No supraclavicular or axillary adenopathy.     Left upper body: No supraclavicular or axillary adenopathy.  Neurological:     General: No focal deficit present.     Mental Status: She is alert and oriented to person, place, and time.  Psychiatric:        Mood and Affect: Mood normal.        Behavior: Behavior normal.     LABS:      Latest Ref Rng & Units 10/25/2023    1:01 PM 10/04/2023    1:05 PM 09/13/2023    1:21 PM  CBC  WBC 4.0 - 10.5 K/uL 7.8  9.6  10.6   Hemoglobin 12.0 - 15.0 g/dL 16.1  09.6  04.5   Hematocrit 36.0 - 46.0 % 37.6  37.2  37.3   Platelets 150 - 400 K/uL 307  299  292       Latest Ref Rng & Units 10/25/2023    1:01 PM 10/04/2023    1:05 PM 09/13/2023    1:21 PM  CMP  Glucose 70 - 99 mg/dL 94  96  90   BUN 8 - 23 mg/dL 30  28  29    Creatinine 0.44 - 1.00 mg/dL 4.09  8.11  9.14   Sodium 135 - 145 mmol/L 137  137  139   Potassium 3.5 - 5.1 mmol/L 4.0  4.0  4.2   Chloride 98 - 111 mmol/L 103  102  105   CO2 22 - 32 mmol/L 25  26  25    Calcium 8.9 - 10.3 mg/dL 9.7  9.3  9.7   Total Protein 6.5 - 8.1 g/dL 6.8  6.6  6.6   Total Bilirubin <1.2 mg/dL 0.6  0.6  0.6   Alkaline Phos 38 - 126 U/L 66  62  65   AST 15 - 41 U/L 14  16  16    ALT 0 - 44 U/L 16  18  19       No results found for: "CEA1", "CEA" / No results found for: "CEA1", "CEA" No results found for: "PSA1" No results found for: "NWG956" No results found for: "CAN125"  Lab Results  Component Value Date   TOTALPROTELP 6.4 10/26/2022   ALBUMINELP  3.6 10/26/2022   A1GS 0.2 10/26/2022   A2GS 0.6 10/26/2022   BETS 1.1 10/26/2022   GAMS 0.9 10/26/2022   MSPIKE  Not Observed 10/26/2022   SPEI Comment 10/26/2022   Lab Results  Component Value Date   TIBC 334 02/21/2023   TIBC 350 02/14/2023   FERRITIN 62 02/21/2023   FERRITIN 67 02/14/2023   IRONPCTSAT 10 (L) 02/21/2023   IRONPCTSAT 10 (L) 02/14/2023   Lab Results  Component Value Date   LDH 140 06/27/2011     STUDIES:   No results found.

## 2023-10-26 ENCOUNTER — Other Ambulatory Visit: Payer: Self-pay

## 2023-11-06 ENCOUNTER — Ambulatory Visit: Payer: PPO | Attending: General Surgery

## 2023-11-06 VITALS — Wt 189.5 lb

## 2023-11-06 DIAGNOSIS — Z483 Aftercare following surgery for neoplasm: Secondary | ICD-10-CM | POA: Insufficient documentation

## 2023-11-06 NOTE — Therapy (Signed)
OUTPATIENT PHYSICAL THERAPY SOZO SCREENING NOTE   Patient Name: Haley Peterson MRN: [id number] DOB:Mar 20, 1946, 77 y.o., female Today's Date: 11/06/2023  PCP: Benita Stabile, MD REFERRING PROVIDER: Emelia Loron, MD   PT End of Session - 11/06/23 [phone]     Visit Number 2   # unchanged due to screen only   PT Start Time 0957    PT Stop Time 1001    PT Time Calculation (min) 4 min    Activity Tolerance Patient tolerated treatment well    Behavior During Therapy [facility] for tasks assessed/performed             Past Medical History:  Diagnosis Date   Arthritis    Bone spur    left heel   Breast cancer (HCC)    bilateral   Cancer (HCC)    Bilateral Breast cancer   Cataract    right eye    CKD (chronic kidney disease), stage III (HCC)    mgd by PCP    History of breast cancer    left, right   Hypertension    Personal history of radiation therapy    Pneumonia 10/2012   Port-A-Cath in place 12/06/2022   Past Surgical History:  Procedure Laterality Date   ABDOMINAL HYSTERECTOMY     BIOPSY  04/12/2017   Procedure: BIOPSY;  Surgeon: Malissa Hippo, MD;  [facility];  Service: Endoscopy;;  ascending colon ulcer biopsies   BREAST BIOPSY Left 09/22/2022   [place] LT BREAST BX W LOC DEV EA ADD LESION IMG BX SPEC US GUIDE 09/22/2022 [facility]   BREAST BIOPSY Left 09/22/2022   [place] LT BREAST BX W LOC DEV 1ST LESION IMG BX SPEC US GUIDE 09/22/2022 [facility]   BREAST LUMPECTOMY  08/28/2001   left   BREAST RECONSTRUCTION Right    implant   CHOLECYSTECTOMY  2002   COLONOSCOPY     COLONOSCOPY N/A 04/12/2017   Procedure: COLONOSCOPY;  Surgeon: Malissa Hippo, MD;  [facility];  Service: Endoscopy;  Laterality: N/A;  830   MASTECTOMY  08/15/2006   right   PARTIAL HYSTERECTOMY  1985   PORTACATH PLACEMENT Right 10/31/2022   Procedure: INSERTION PORT-A-CATH;  Surgeon: Emelia Loron, MD;  Location: [facility];  Service: General;   Laterality: Right;   REPLACEMENT TOTAL KNEE  2009, 2010   right-2010, left 2009   SIMPLE MASTECTOMY WITH AXILLARY SENTINEL NODE BIOPSY Left 10/31/2022   Procedure: LEFT MASTECTOMY;  Surgeon: Emelia Loron, MD;  Location: [facility];  Service: General;  Laterality: Left;  90 MIN ROOM 2   TISSUE EXPANDER PLACEMENT     TOTAL HIP ARTHROPLASTY Right 10/09/2017   Procedure: RIGHT TOTAL HIP ARTHROPLASTY ANTERIOR APPROACH;  Surgeon: Gean Birchwood, MD;  Location: [facility];  Service: Orthopedics;  Laterality: Right;   TOTAL HIP ARTHROPLASTY Left 09/23/2019   Procedure: Left Anterior Hip Arthroplasty;  Surgeon: Gean Birchwood, MD;  Location[facility];  Service: Orthopedics;  Laterality: Left;   Patient Active Problem List   Diagnosis Date Noted   Iron deficiency anemia 02/21/2023   Port-A-Cath in place 12/06/2022   S/P left mastectomy 10/31/2022   Breast cancer of upper-outer quadrant of left female breast (HCC) 10/25/2022   S/P hip replacement, left 09/23/2019   Osteoarthritis of left hip 09/20/2019   Primary osteoarthritis of right hip 10/09/2017   Osteoarthritis of right hip 10/04/2017   Special screening for malignant neoplasms, colon 12/30/2016   Breast  cancer (HCC) 06/27/2011   Hx Breast cancer, Right, multifocal IDC, receptor +, Her2 - 06/07/2006    Class: Stage 1   Hx Breast cancer (DCIS), Right, Stage 0,  08/08/2001    REFERRING DIAG: left breast cancer at risk for lymphedema  THERAPY DIAG: Aftercare following surgery for neoplasm  PERTINENT HISTORY: Patient was diagnosed on 10/13/22 with left breast cancer in 2 locations measuring around 2.5cm in total. Negative axillary [place]. grade 2 IDC. It is ER/PR/HER2 positive with a Ki67 of 20%.  Prior hx of Lt lumpectomy with radiation and SLNB with 1 negative lymph node removed in 2002 and Rt mastectomy with implant in 2007. Lt mastectomy 10/31/22 with 1 negative node removed.   PRECAUTIONS: left UE Lymphedema risk  SUBJECTIVE: Pt  returns for her 3 month L-Dex screen.   PAIN:  Are you having pain? No  SOZO SCREENING: Patient was assessed today using the SOZO machine to determine the lymphedema index score. This was compared to her baseline score. It was determined that she is within the recommended range when compared to her baseline and no further action is needed at this time. She will continue SOZO screenings. These are done every 3 months for 2 years post operatively followed by every 6 months for 2 years, and then annually.  P: SOZO Q2months until at least 10/31/24 and Q68months until 10/31/26   L-DEX FLOWSHEETS - 11/06/23 0900       L-DEX LYMPHEDEMA SCREENING   Measurement Type Unilateral    L-DEX MEASUREMENT EXTREMITY Upper Extremity    POSITION  Standing    DOMINANT SIDE Right    At Risk Side Left    BASELINE SCORE (UNILATERAL) 2.8    L-DEX SCORE (UNILATERAL) 5.6    VALUE CHANGE (UNILAT) 2.8              Hermenia Bers, PTA 11/06/2023, 10:00 AM

## 2023-11-17 ENCOUNTER — Inpatient Hospital Stay: Payer: PPO | Attending: Hematology

## 2023-11-17 ENCOUNTER — Inpatient Hospital Stay: Payer: PPO

## 2023-11-17 VITALS — BP 145/50 | HR 76 | Temp 97.7°F | Resp 18 | Wt 191.8 lb

## 2023-11-17 DIAGNOSIS — Z170 Estrogen receptor positive status [ER+]: Secondary | ICD-10-CM | POA: Diagnosis not present

## 2023-11-17 DIAGNOSIS — Z95828 Presence of other vascular implants and grafts: Secondary | ICD-10-CM

## 2023-11-17 DIAGNOSIS — C50412 Malignant neoplasm of upper-outer quadrant of left female breast: Secondary | ICD-10-CM

## 2023-11-17 DIAGNOSIS — Z1732 Human epidermal growth factor receptor 2 negative status: Secondary | ICD-10-CM | POA: Diagnosis not present

## 2023-11-17 DIAGNOSIS — Z79620 Long term (current) use of immunosuppressive biologic: Secondary | ICD-10-CM | POA: Diagnosis not present

## 2023-11-17 DIAGNOSIS — Z1721 Progesterone receptor positive status: Secondary | ICD-10-CM | POA: Diagnosis not present

## 2023-11-17 DIAGNOSIS — Z5112 Encounter for antineoplastic immunotherapy: Secondary | ICD-10-CM | POA: Insufficient documentation

## 2023-11-17 LAB — CBC WITH DIFFERENTIAL/PLATELET
Abs Immature Granulocytes: 0.02 10*3/uL (ref 0.00–0.07)
Basophils Absolute: 0.1 10*3/uL (ref 0.0–0.1)
Basophils Relative: 1 %
Eosinophils Absolute: 0.3 10*3/uL (ref 0.0–0.5)
Eosinophils Relative: 4 %
HCT: 37.2 % (ref 36.0–46.0)
Hemoglobin: 11.7 g/dL — ABNORMAL LOW (ref 12.0–15.0)
Immature Granulocytes: 0 %
Lymphocytes Relative: 18 %
Lymphs Abs: 1.3 10*3/uL (ref 0.7–4.0)
MCH: 29 pg (ref 26.0–34.0)
MCHC: 31.5 g/dL (ref 30.0–36.0)
MCV: 92.3 fL (ref 80.0–100.0)
Monocytes Absolute: 0.5 10*3/uL (ref 0.1–1.0)
Monocytes Relative: 7 %
Neutro Abs: 5.3 10*3/uL (ref 1.7–7.7)
Neutrophils Relative %: 70 %
Platelets: 287 10*3/uL (ref 150–400)
RBC: 4.03 MIL/uL (ref 3.87–5.11)
RDW: 12.4 % (ref 11.5–15.5)
WBC: 7.5 10*3/uL (ref 4.0–10.5)
nRBC: 0 % (ref 0.0–0.2)

## 2023-11-17 LAB — COMPREHENSIVE METABOLIC PANEL
ALT: 15 U/L (ref 0–44)
AST: 16 U/L (ref 15–41)
Albumin: 3.6 g/dL (ref 3.5–5.0)
Alkaline Phosphatase: 64 U/L (ref 38–126)
Anion gap: 7 (ref 5–15)
BUN: 24 mg/dL — ABNORMAL HIGH (ref 8–23)
CO2: 25 mmol/L (ref 22–32)
Calcium: 9.5 mg/dL (ref 8.9–10.3)
Chloride: 107 mmol/L (ref 98–111)
Creatinine, Ser: 1.26 mg/dL — ABNORMAL HIGH (ref 0.44–1.00)
GFR, Estimated: 44 mL/min — ABNORMAL LOW (ref >60)
Glucose, Bld: 93 mg/dL (ref 70–99)
Potassium: 3.8 mmol/L (ref 3.5–5.1)
Sodium: 139 mmol/L (ref 135–145)
Total Bilirubin: 0.8 mg/dL (ref 0.0–1.2)
Total Protein: 6.6 g/dL (ref 6.5–8.1)

## 2023-11-17 LAB — MAGNESIUM: Magnesium: 2.1 mg/dL (ref 1.7–2.4)

## 2023-11-17 MED ORDER — HEPARIN SOD (PORK) LOCK FLUSH 100 UNIT/ML IV SOLN
500.0000 [IU] | Freq: Once | INTRAVENOUS | Status: AC | PRN
  Administered 2023-11-17: 500 [IU]

## 2023-11-17 MED ORDER — TRASTUZUMAB-ANNS CHEMO 150 MG IV SOLR
6.0000 mg/kg | Freq: Once | INTRAVENOUS | Status: AC
  Administered 2023-11-17: 504 mg via INTRAVENOUS
  Filled 2023-11-17: qty 24

## 2023-11-17 MED ORDER — CETIRIZINE HCL 10 MG/ML IV SOLN
10.0000 mg | Freq: Once | INTRAVENOUS | Status: AC
  Administered 2023-11-17: 10 mg via INTRAVENOUS
  Filled 2023-11-17: qty 1

## 2023-11-17 MED ORDER — SODIUM CHLORIDE 0.9% FLUSH
10.0000 mL | INTRAVENOUS | Status: DC | PRN
  Administered 2023-11-17: 10 mL

## 2023-11-17 MED ORDER — ACETAMINOPHEN 325 MG PO TABS
650.0000 mg | ORAL_TABLET | Freq: Once | ORAL | Status: AC
  Administered 2023-11-17: 650 mg via ORAL
  Filled 2023-11-17: qty 2

## 2023-11-17 MED ORDER — SODIUM CHLORIDE 0.9% FLUSH
10.0000 mL | INTRAVENOUS | Status: DC | PRN
  Administered 2023-11-17: 10 mL via INTRAVENOUS

## 2023-11-17 MED ORDER — SODIUM CHLORIDE 0.9 % IV SOLN
Freq: Once | INTRAVENOUS | Status: AC
Start: 2023-11-17 — End: 2023-11-17

## 2023-11-17 NOTE — Patient Instructions (Signed)
 [facility] [place] - A DEPT OF [facility]  Discharge Instructions: Thank you for choosing [facility] to provide your oncology and hematology care.  If you have a lab appointment with the Cancer Center - please note that after April 8th, 2024, all labs will be drawn in the cancer center.  You do not have to check in or register with the main entrance as you have in the past but will complete your check-in in the cancer center.  Wear comfortable clothing and clothing appropriate for easy access to any Portacath or PICC line.   We strive to give you quality time with your provider. You may need to reschedule your appointment if you arrive late (15 or more minutes).  Arriving late affects you and other patients whose appointments are after yours.  Also, if you miss three or more appointments without notifying the office, you may be dismissed from the clinic at the provider's discretion.      For prescription refill requests, have your pharmacy contact our office and allow 72 hours for refills to be completed.    Today you received the following chemotherapy and/or immunotherapy agents Kanjinti      To help prevent nausea and vomiting after your treatment, we encourage you to take your nausea medication as directed.  BELOW ARE SYMPTOMS THAT SHOULD BE REPORTED IMMEDIATELY: *FEVER GREATER THAN 100.4 F (38 C) OR HIGHER *CHILLS OR SWEATING *NAUSEA AND VOMITING THAT IS NOT CONTROLLED WITH YOUR NAUSEA MEDICATION *UNUSUAL SHORTNESS OF BREATH *UNUSUAL BRUISING OR BLEEDING *URINARY PROBLEMS (pain or burning when urinating, or frequent urination) *BOWEL PROBLEMS (unusual diarrhea, constipation, pain near the anus) TENDERNESS IN MOUTH AND THROAT WITH OR WITHOUT PRESENCE OF ULCERS (sore throat, sores in mouth, or a toothache) UNUSUAL RASH, SWELLING OR PAIN  UNUSUAL VAGINAL DISCHARGE OR ITCHING   Items with * indicate a potential emergency and should be followed up  as soon as possible or go to the Emergency Department if any problems should occur.  Please show the CHEMOTHERAPY ALERT CARD or IMMUNOTHERAPY ALERT CARD at check-in to the Emergency Department and triage nurse.  Should you have questions after your visit or need to cancel or reschedule your appointment, please contact [facility] [place] - A DEPT OF [facility] [phone]  and follow the prompts.  Office hours are 8:00 a.m. to 4:30 p.m. Monday - Friday. Please note that voicemails left after 4:00 p.m. may not be returned until the following business day.  We are closed weekends and major holidays. You have access to a nurse at all times for urgent questions. Please call the main number to the clinic [phone] and follow the prompts.  For any non-urgent questions, you may also contact your provider using MyChart. We now offer e-Visits for anyone 41 and older to request care online for non-urgent symptoms. For details visit [email or web address].   Also download the MyChart app! Go to the app store, search "MyChart", open the app, select [facility], and log in with your MyChart username and password.

## 2023-11-17 NOTE — Progress Notes (Signed)
 Patient tolerated chemotherapy with no complaints voiced.  Side effects with management reviewed with understanding verbalized.  Port site clean and dry with no bruising or swelling noted at site.  Good blood return noted before and after administration of chemotherapy.  Band aid applied.  Patient left in satisfactory condition with VSS and no s/s of distress noted. All follow ups as scheduled.   Venkat Ankney [employer]

## 2023-11-20 ENCOUNTER — Encounter: Payer: Self-pay | Admitting: Hematology

## 2023-11-22 ENCOUNTER — Ambulatory Visit (HOSPITAL_COMMUNITY)
Admission: RE | Admit: 2023-11-22 | Discharge: 2023-11-22 | Disposition: A | Discharge status: Home / Self Care | Payer: PPO | Source: Ambulatory Visit | Attending: Hematology | Admitting: Hematology

## 2023-11-22 DIAGNOSIS — C50412 Malignant neoplasm of upper-outer quadrant of left female breast: Secondary | ICD-10-CM | POA: Diagnosis not present

## 2023-11-22 DIAGNOSIS — Z170 Estrogen receptor positive status [ER+]: Secondary | ICD-10-CM | POA: Insufficient documentation

## 2023-11-22 DIAGNOSIS — Z79899 Other long term (current) drug therapy: Secondary | ICD-10-CM | POA: Insufficient documentation

## 2023-11-22 LAB — ECHOCARDIOGRAM COMPLETE
AR max vel: 1.8 cm2
AV Area VTI: 1.7 cm2
AV Area mean vel: 1.51 cm2
AV Mean grad: 8.3 mm[Hg]
AV Peak grad: 15.8 mm[Hg]
Ao pk vel: 1.99 m/s
Area-P 1/2: 2.54 cm2
Calc EF: 54.4 %
S' Lateral: 2.6 cm
Single Plane A2C EF: 57.7 %
Single Plane A4C EF: 56.7 %

## 2023-11-22 NOTE — Progress Notes (Signed)
*  PRELIMINARY RESULTS* Echocardiogram 2D Echocardiogram has been performed.  [name] 11/22/2023, 12:39 PM

## 2023-12-06 ENCOUNTER — Inpatient Hospital Stay: Payer: PPO

## 2023-12-06 VITALS — BP 147/50 | HR 81 | Temp 96.7°F | Resp 20 | Wt 192.4 lb

## 2023-12-06 DIAGNOSIS — Z170 Estrogen receptor positive status [ER+]: Secondary | ICD-10-CM

## 2023-12-06 DIAGNOSIS — Z95828 Presence of other vascular implants and grafts: Secondary | ICD-10-CM

## 2023-12-06 DIAGNOSIS — Z5112 Encounter for antineoplastic immunotherapy: Secondary | ICD-10-CM | POA: Diagnosis not present

## 2023-12-06 LAB — CBC WITH DIFFERENTIAL/PLATELET
Abs Immature Granulocytes: 0.02 10*3/uL (ref 0.00–0.07)
Basophils Absolute: 0 10*3/uL (ref 0.0–0.1)
Basophils Relative: 1 %
Eosinophils Absolute: 0.3 10*3/uL (ref 0.0–0.5)
Eosinophils Relative: 4 %
HCT: 37.3 % (ref 36.0–46.0)
Hemoglobin: 12.3 g/dL (ref 12.0–15.0)
Immature Granulocytes: 0 %
Lymphocytes Relative: 17 %
Lymphs Abs: 1.4 10*3/uL (ref 0.7–4.0)
MCH: 30 pg (ref 26.0–34.0)
MCHC: 33 g/dL (ref 30.0–36.0)
MCV: 91 fL (ref 80.0–100.0)
Monocytes Absolute: 0.7 10*3/uL (ref 0.1–1.0)
Monocytes Relative: 9 %
Neutro Abs: 5.7 10*3/uL (ref 1.7–7.7)
Neutrophils Relative %: 69 %
Platelets: 296 10*3/uL (ref 150–400)
RBC: 4.1 MIL/uL (ref 3.87–5.11)
RDW: 12.6 % (ref 11.5–15.5)
WBC: 8.1 10*3/uL (ref 4.0–10.5)
nRBC: 0 % (ref 0.0–0.2)

## 2023-12-06 LAB — COMPREHENSIVE METABOLIC PANEL
ALT: 18 U/L (ref 0–44)
AST: 17 U/L (ref 15–41)
Albumin: 3.6 g/dL (ref 3.5–5.0)
Alkaline Phosphatase: 71 U/L (ref 38–126)
Anion gap: 9 (ref 5–15)
BUN: 30 mg/dL — ABNORMAL HIGH (ref 8–23)
CO2: 26 mmol/L (ref 22–32)
Calcium: 9.5 mg/dL (ref 8.9–10.3)
Chloride: 103 mmol/L (ref 98–111)
Creatinine, Ser: 1.12 mg/dL — ABNORMAL HIGH (ref 0.44–1.00)
GFR, Estimated: 51 mL/min — ABNORMAL LOW (ref >60)
Glucose, Bld: 97 mg/dL (ref 70–99)
Potassium: 4.1 mmol/L (ref 3.5–5.1)
Sodium: 138 mmol/L (ref 135–145)
Total Bilirubin: 0.6 mg/dL (ref 0.0–1.2)
Total Protein: 6.7 g/dL (ref 6.5–8.1)

## 2023-12-06 LAB — TSH: TSH: 1.381 u[IU]/mL (ref 0.350–4.500)

## 2023-12-06 LAB — MAGNESIUM: Magnesium: 2.1 mg/dL (ref 1.7–2.4)

## 2023-12-06 MED ORDER — CETIRIZINE HCL 10 MG/ML IV SOLN
10.0000 mg | Freq: Once | INTRAVENOUS | Status: AC
Start: 2023-12-06 — End: 2023-12-06
  Administered 2023-12-06: 10 mg via INTRAVENOUS
  Filled 2023-12-06: qty 1

## 2023-12-06 MED ORDER — ACETAMINOPHEN 325 MG PO TABS
650.0000 mg | ORAL_TABLET | Freq: Once | ORAL | Status: AC
  Administered 2023-12-06: 650 mg via ORAL
  Filled 2023-12-06: qty 2

## 2023-12-06 MED ORDER — SODIUM CHLORIDE 0.9% FLUSH
10.0000 mL | INTRAVENOUS | Status: AC
  Administered 2023-12-06: 10 mL

## 2023-12-06 MED ORDER — SODIUM CHLORIDE 0.9 % IV SOLN: Freq: Once | INTRAVENOUS | Status: AC

## 2023-12-06 MED ORDER — TRASTUZUMAB-ANNS CHEMO 150 MG IV SOLR
6.0000 mg/kg | Freq: Once | INTRAVENOUS | Status: AC
  Administered 2023-12-06: 504 mg via INTRAVENOUS
  Filled 2023-12-06: qty 24

## 2023-12-06 MED ORDER — SODIUM CHLORIDE 0.9% FLUSH
10.0000 mL | INTRAVENOUS | Status: DC | PRN
  Administered 2023-12-06: 10 mL

## 2023-12-06 MED ORDER — HEPARIN SOD (PORK) LOCK FLUSH 100 UNIT/ML IV SOLN
500.0000 [IU] | Freq: Once | INTRAVENOUS | Status: AC | PRN
  Administered 2023-12-06: 500 [IU]

## 2023-12-06 NOTE — Progress Notes (Signed)
Patient presents today for Kanjinti infusion per providers order.  Vital signs and labs within parameters for treatment.  Patient has no new complaints at this time.  Treatment given today per MD orders.  Stable during infusion without adverse affects.  Vital signs stable.  No complaints at this time.  Discharge from clinic ambulatory in stable condition.  Alert and oriented X 3.  Follow up with [facility] as scheduled.

## 2023-12-06 NOTE — Patient Instructions (Signed)
[facility] [place] - A DEPT OF [facility]  Discharge Instructions: Thank you for choosing [facility] to provide your oncology and hematology care.  If you have a lab appointment with the Cancer Center - please note that after April 8th, 2024, all labs will be drawn in the cancer center.  You do not have to check in or register with the main entrance as you have in the past but will complete your check-in in the cancer center.  Wear comfortable clothing and clothing appropriate for easy access to any Portacath or PICC line.   We strive to give you quality time with your provider. You may need to reschedule your appointment if you arrive late (15 or more minutes).  Arriving late affects you and other patients whose appointments are after yours.  Also, if you miss three or more appointments without notifying the office, you may be dismissed from the clinic at the provider's discretion.      For prescription refill requests, have your pharmacy contact our office and allow 72 hours for refills to be completed.    Today you received the following chemotherapy and/or immunotherapy agents kanjinti      To help prevent nausea and vomiting after your treatment, we encourage you to take your nausea medication as directed.  BELOW ARE SYMPTOMS THAT SHOULD BE REPORTED IMMEDIATELY: *FEVER GREATER THAN 100.4 F (38 C) OR HIGHER *CHILLS OR SWEATING *NAUSEA AND VOMITING THAT IS NOT CONTROLLED WITH YOUR NAUSEA MEDICATION *UNUSUAL SHORTNESS OF BREATH *UNUSUAL BRUISING OR BLEEDING *URINARY PROBLEMS (pain or burning when urinating, or frequent urination) *BOWEL PROBLEMS (unusual diarrhea, constipation, pain near the anus) TENDERNESS IN MOUTH AND THROAT WITH OR WITHOUT PRESENCE OF ULCERS (sore throat, sores in mouth, or a toothache) UNUSUAL RASH, SWELLING OR PAIN  UNUSUAL VAGINAL DISCHARGE OR ITCHING   Items with * indicate a potential emergency and should be followed up  as soon as possible or go to the Emergency Department if any problems should occur.  Please show the CHEMOTHERAPY ALERT CARD or IMMUNOTHERAPY ALERT CARD at check-in to the Emergency Department and triage nurse.  Should you have questions after your visit or need to cancel or reschedule your appointment, please contact [facility] [place] - A DEPT OF Eligha Brideg[facility] [phone]  and follow the prompts.  Office hours are 8:00 a.m. to 4:30 p.m. Monday - Friday. Please note that voicemails left after 4:00 p.m. may not be returned until the following business day.  We are closed weekends and major holidays. You have access to a nurse at all times for urgent questions. Please call the main number to the clinic [phone] and follow the prompts.  For any non-urgent questions, you may also contact your provider using MyChart. We now offer e-Visits for anyone 45 and older to request care online for non-urgent symptoms. For details visit [email or web address].   Also download the MyChart app! Go to the app store, search "MyChart", open the app, select [place], and log in with your MyChart username and password.

## 2023-12-08 ENCOUNTER — Other Ambulatory Visit: Payer: Self-pay

## 2023-12-08 MED ORDER — CHLORHEXIDINE GLUCONATE 0.12 % MT SOLN: 15.0000 mL | Freq: Two times a day (BID) | OROMUCOSAL | 0 refills | Status: AC

## 2023-12-11 ENCOUNTER — Inpatient Hospital Stay: Payer: PPO

## 2023-12-11 ENCOUNTER — Inpatient Hospital Stay: Payer: PPO | Admitting: Hematology

## 2024-01-16 DIAGNOSIS — E669 Obesity, unspecified: Secondary | ICD-10-CM | POA: Diagnosis not present

## 2024-01-16 DIAGNOSIS — E782 Mixed hyperlipidemia: Secondary | ICD-10-CM | POA: Diagnosis not present

## 2024-01-16 DIAGNOSIS — R7301 Impaired fasting glucose: Secondary | ICD-10-CM | POA: Diagnosis not present

## 2024-01-22 DIAGNOSIS — E7800 Pure hypercholesterolemia, unspecified: Secondary | ICD-10-CM | POA: Diagnosis not present

## 2024-01-22 DIAGNOSIS — G609 Hereditary and idiopathic neuropathy, unspecified: Secondary | ICD-10-CM | POA: Diagnosis not present

## 2024-01-22 DIAGNOSIS — H269 Unspecified cataract: Secondary | ICD-10-CM | POA: Diagnosis not present

## 2024-01-22 DIAGNOSIS — I129 Hypertensive chronic kidney disease with stage 1 through stage 4 chronic kidney disease, or unspecified chronic kidney disease: Secondary | ICD-10-CM | POA: Diagnosis not present

## 2024-01-22 DIAGNOSIS — D649 Anemia, unspecified: Secondary | ICD-10-CM | POA: Diagnosis not present

## 2024-01-22 DIAGNOSIS — N1831 Chronic kidney disease, stage 3a: Secondary | ICD-10-CM | POA: Diagnosis not present

## 2024-01-22 DIAGNOSIS — M8580 Other specified disorders of bone density and structure, unspecified site: Secondary | ICD-10-CM | POA: Diagnosis not present

## 2024-01-22 DIAGNOSIS — I10 Essential (primary) hypertension: Secondary | ICD-10-CM | POA: Diagnosis not present

## 2024-01-22 DIAGNOSIS — M1990 Unspecified osteoarthritis, unspecified site: Secondary | ICD-10-CM | POA: Diagnosis not present

## 2024-01-22 DIAGNOSIS — E870 Hyperosmolality and hypernatremia: Secondary | ICD-10-CM | POA: Diagnosis not present

## 2024-01-22 DIAGNOSIS — R7301 Impaired fasting glucose: Secondary | ICD-10-CM | POA: Diagnosis not present

## 2024-01-22 DIAGNOSIS — C50412 Malignant neoplasm of upper-outer quadrant of left female breast: Secondary | ICD-10-CM | POA: Diagnosis not present

## 2024-01-29 ENCOUNTER — Encounter: Payer: Self-pay | Admitting: Hematology

## 2024-02-05 ENCOUNTER — Ambulatory Visit: Payer: PPO | Attending: General Surgery

## 2024-02-05 VITALS — Wt 190.4 lb

## 2024-02-05 DIAGNOSIS — Z483 Aftercare following surgery for neoplasm: Secondary | ICD-10-CM | POA: Insufficient documentation

## 2024-02-05 NOTE — Therapy (Signed)
 OUTPATIENT PHYSICAL THERAPY SOZO SCREENING NOTE   Patient Name: Haley Peterson MRN: [id number] DOB:1946-06-02, 78 y.o., female Today's Date: 02/05/2024  PCP: Benita Stabile, MD REFERRING PROVIDER: Emelia Loron, MD   PT End of Session - 02/05/24 1056     Visit Number 2   # unchanged due to screen only   PT Start Time 1054    PT Stop Time 1058    PT Time Calculation (min) 4 min    Activity Tolerance Patient tolerated treatment well    Behavior During Therapy WFL for tasks assessed/performed             Past Medical History:  Diagnosis Date   Arthritis    Bone spur    left heel   Breast cancer (HCC)    bilateral   Cancer (HCC)    Bilateral Breast cancer   Cataract    right eye    CKD (chronic kidney disease), stage III (HCC)    mgd by PCP    History of breast cancer    left, right   Hypertension    Personal history of radiation therapy    Pneumonia 10/2012   Port-A-Cath in place 12/06/2022   Past Surgical History:  Procedure Laterality Date   ABDOMINAL HYSTERECTOMY     BIOPSY  04/12/2017   Procedure: BIOPSY;  Surgeon: Malissa Hippo, MD;  Location: [facility];  Service: Endoscopy;;  ascending colon ulcer biopsies   BREAST BIOPSY Left 09/22/2022   [place] LT BREAST BX W LOC DEV EA ADD LESION IMG BX SPEC US GUIDE 09/22/2022 [facility]   BREAST BIOPSY Left 09/22/2022   [place] LT BREAST BX W LOC DEV 1ST LESION IMG BX SPEC US GUIDE 09/22/2022 [facility]   BREAST LUMPECTOMY  08/28/2001   left   BREAST RECONSTRUCTION Right    implant   CHOLECYSTECTOMY  2002   COLONOSCOPY     COLONOSCOPY N/A 04/12/2017   Procedure: COLONOSCOPY;  Surgeon: Malissa Hippo, MD;  Location: [facility];  Service: Endoscopy;  Laterality: N/A;  830   MASTECTOMY  08/15/2006   right   PARTIAL HYSTERECTOMY  1985   PORTACATH PLACEMENT Right 10/31/2022   Procedure: INSERTION PORT-A-CATH;  Surgeon: Emelia Loron, MD;  Location: [facility];  Service: General;   Laterality: Right;   REPLACEMENT TOTAL KNEE  2009, 2010   right-2010, left 2009   SIMPLE MASTECTOMY WITH AXILLARY SENTINEL NODE BIOPSY Left 10/31/2022   Procedure: LEFT MASTECTOMY;  Surgeon: Emelia Loron, MD;  Location: [facility];  Service: General;  Laterality: Left;  90 MIN ROOM 2   TISSUE EXPANDER PLACEMENT     TOTAL HIP ARTHROPLASTY Right 10/09/2017   Procedure: RIGHT TOTAL HIP ARTHROPLASTY ANTERIOR APPROACH;  Surgeon: Gean Birchwood, MD;  Location: [facility];  Service: Orthopedics;  Laterality: Right;   TOTAL HIP ARTHROPLASTY Left 09/23/2019   Procedure: Left Anterior Hip Arthroplasty;  Surgeon: Gean Birchwood, MD;  Location: [facility];  Service: Orthopedics;  Laterality: Left;   Patient Active Problem List   Diagnosis Date Noted   Iron deficiency anemia 02/21/2023   Port-A-Cath in place 12/06/2022   S/P left mastectomy 10/31/2022   Breast cancer of upper-outer quadrant of left female breast (HCC) 10/25/2022   S/P hip replacement, left 09/23/2019   Osteoarthritis of left hip 09/20/2019   Primary osteoarthritis of right hip 10/09/2017   Osteoarthritis of right hip 10/04/2017   Special screening for malignant neoplasms, colon 12/30/2016   Breast  cancer (HCC) 06/27/2011   Hx Breast cancer, Right, multifocal IDC, receptor +, Her2 - 06/07/2006    Class: Stage 1   Hx Breast cancer (DCIS), Right, Stage 0,  08/08/2001    REFERRING DIAG: left breast cancer at risk for lymphedema  THERAPY DIAG: Aftercare following surgery for neoplasm  PERTINENT HISTORY: Patient was diagnosed on 10/13/22 with left breast cancer in 2 locations measuring around 2.5cm in total. Negative axillary [place]. grade 2 IDC. It is ER/PR/HER2 positive with a Ki67 of 20%.  Prior hx of Lt lumpectomy with radiation and SLNB with 1 negative lymph node removed in 2002 and Rt mastectomy with implant in 2007. Lt mastectomy 10/31/22 with 1 negative node removed.   PRECAUTIONS: left UE Lymphedema risk  SUBJECTIVE: Pt  returns for her 3 month L-Dex screen.   PAIN:  Are you having pain? No  SOZO SCREENING: Patient was assessed today using the SOZO machine to determine the lymphedema index score. This was compared to her baseline score. It was determined that she is within the recommended range when compared to her baseline and no further action is needed at this time. She will continue SOZO screenings. These are done every 3 months for 2 years post operatively followed by every 6 months for 2 years, and then annually.  P: SOZO Q54months until at least 10/31/24 and Q77months until 10/31/26   L-DEX FLOWSHEETS - 02/05/24 1000       L-DEX LYMPHEDEMA SCREENING   Measurement Type Unilateral    L-DEX MEASUREMENT EXTREMITY Upper Extremity    POSITION  Standing    DOMINANT SIDE Right    At Risk Side Left    BASELINE SCORE (UNILATERAL) 2.8    L-DEX SCORE (UNILATERAL) 7.7    VALUE CHANGE (UNILAT) 4.9              Hermenia Bers, PTA 02/05/2024, 10:57 AM

## 2024-02-06 ENCOUNTER — Other Ambulatory Visit: Payer: Self-pay

## 2024-02-12 DIAGNOSIS — I10 Essential (primary) hypertension: Secondary | ICD-10-CM | POA: Diagnosis not present

## 2024-04-17 ENCOUNTER — Inpatient Hospital Stay: Payer: Self-pay | Attending: Hematology

## 2024-04-17 DIAGNOSIS — Z1731 Human epidermal growth factor receptor 2 positive status: Secondary | ICD-10-CM | POA: Diagnosis not present

## 2024-04-17 DIAGNOSIS — Z923 Personal history of irradiation: Secondary | ICD-10-CM | POA: Diagnosis not present

## 2024-04-17 DIAGNOSIS — Z9012 Acquired absence of left breast and nipple: Secondary | ICD-10-CM | POA: Diagnosis not present

## 2024-04-17 DIAGNOSIS — Z79811 Long term (current) use of aromatase inhibitors: Secondary | ICD-10-CM | POA: Insufficient documentation

## 2024-04-17 DIAGNOSIS — Z808 Family history of malignant neoplasm of other organs or systems: Secondary | ICD-10-CM | POA: Insufficient documentation

## 2024-04-17 DIAGNOSIS — G629 Polyneuropathy, unspecified: Secondary | ICD-10-CM | POA: Diagnosis not present

## 2024-04-17 DIAGNOSIS — Z79899 Other long term (current) drug therapy: Secondary | ICD-10-CM | POA: Insufficient documentation

## 2024-04-17 DIAGNOSIS — Z87891 Personal history of nicotine dependence: Secondary | ICD-10-CM | POA: Insufficient documentation

## 2024-04-17 DIAGNOSIS — Z170 Estrogen receptor positive status [ER+]: Secondary | ICD-10-CM | POA: Diagnosis not present

## 2024-04-17 DIAGNOSIS — C50412 Malignant neoplasm of upper-outer quadrant of left female breast: Secondary | ICD-10-CM | POA: Diagnosis not present

## 2024-04-17 DIAGNOSIS — D649 Anemia, unspecified: Secondary | ICD-10-CM | POA: Diagnosis not present

## 2024-04-17 DIAGNOSIS — Z79620 Long term (current) use of immunosuppressive biologic: Secondary | ICD-10-CM | POA: Insufficient documentation

## 2024-04-17 DIAGNOSIS — Z86000 Personal history of in-situ neoplasm of breast: Secondary | ICD-10-CM | POA: Diagnosis not present

## 2024-04-17 DIAGNOSIS — Z1721 Progesterone receptor positive status: Secondary | ICD-10-CM | POA: Insufficient documentation

## 2024-04-17 DIAGNOSIS — M8580 Other specified disorders of bone density and structure, unspecified site: Secondary | ICD-10-CM | POA: Insufficient documentation

## 2024-04-17 LAB — CBC WITH DIFFERENTIAL/PLATELET
Abs Immature Granulocytes: 0.02 10*3/uL (ref 0.00–0.07)
Basophils Absolute: 0 10*3/uL (ref 0.0–0.1)
Basophils Relative: 1 %
Eosinophils Absolute: 0.3 10*3/uL (ref 0.0–0.5)
Eosinophils Relative: 3 %
HCT: 34.9 % — ABNORMAL LOW (ref 36.0–46.0)
Hemoglobin: 11 g/dL — ABNORMAL LOW (ref 12.0–15.0)
Immature Granulocytes: 0 %
Lymphocytes Relative: 17 %
Lymphs Abs: 1.5 10*3/uL (ref 0.7–4.0)
MCH: 28.6 pg (ref 26.0–34.0)
MCHC: 31.5 g/dL (ref 30.0–36.0)
MCV: 90.9 fL (ref 80.0–100.0)
Monocytes Absolute: 0.9 10*3/uL (ref 0.1–1.0)
Monocytes Relative: 10 %
Neutro Abs: 6 10*3/uL (ref 1.7–7.7)
Neutrophils Relative %: 69 %
Platelets: 249 10*3/uL (ref 150–400)
RBC: 3.84 MIL/uL — ABNORMAL LOW (ref 3.87–5.11)
RDW: 13.2 % (ref 11.5–15.5)
WBC: 8.7 10*3/uL (ref 4.0–10.5)
nRBC: 0 % (ref 0.0–0.2)

## 2024-04-17 LAB — COMPREHENSIVE METABOLIC PANEL WITH GFR
ALT: 15 U/L (ref 0–44)
AST: 14 U/L — ABNORMAL LOW (ref 15–41)
Albumin: 3.2 g/dL — ABNORMAL LOW (ref 3.5–5.0)
Alkaline Phosphatase: 64 U/L (ref 38–126)
Anion gap: 5 (ref 5–15)
BUN: 30 mg/dL — ABNORMAL HIGH (ref 8–23)
CO2: 26 mmol/L (ref 22–32)
Calcium: 9.2 mg/dL (ref 8.9–10.3)
Chloride: 106 mmol/L (ref 98–111)
Creatinine, Ser: 1.25 mg/dL — ABNORMAL HIGH (ref 0.44–1.00)
GFR, Estimated: 44 mL/min — ABNORMAL LOW (ref >60)
Glucose, Bld: 102 mg/dL — ABNORMAL HIGH (ref 70–99)
Potassium: 4 mmol/L (ref 3.5–5.1)
Sodium: 137 mmol/L (ref 135–145)
Total Bilirubin: 0.4 mg/dL (ref 0.0–1.2)
Total Protein: 5.8 g/dL — ABNORMAL LOW (ref 6.5–8.1)

## 2024-04-17 LAB — TSH: TSH: 0.958 u[IU]/mL (ref 0.350–4.500)

## 2024-04-17 LAB — MAGNESIUM: Magnesium: 1.9 mg/dL (ref 1.7–2.4)

## 2024-04-17 MED ORDER — HEPARIN SOD (PORK) LOCK FLUSH 100 UNIT/ML IV SOLN
500.0000 [IU] | Freq: Once | INTRAVENOUS | Status: AC
  Administered 2024-04-17: 500 [IU] via INTRAVENOUS

## 2024-04-17 MED ORDER — SODIUM CHLORIDE 0.9% FLUSH
10.0000 mL | INTRAVENOUS | Status: DC | PRN
  Administered 2024-04-17: 10 mL via INTRAVENOUS

## 2024-04-17 NOTE — Patient Instructions (Signed)
 [facility] [place] - A DEPT OF [facility]  Discharge Instructions: Thank you for choosing [facility] to provide your oncology and hematology care.  If you have a lab appointment with the Cancer Center - please note that after April 8th, 2024, all labs will be drawn in the cancer center.  You do not have to check in or register with the main entrance as you have in the past but will complete your check-in in the cancer center.  Wear comfortable clothing and clothing appropriate for easy access to any Portacath or PICC line.   We strive to give you quality time with your provider. You may need to reschedule your appointment if you arrive late (15 or more minutes).  Arriving late affects you and other patients whose appointments are after yours.  Also, if you miss three or more appointments without notifying the office, you may be dismissed from the clinic at the provider's discretion.      For prescription refill requests, have your pharmacy contact our office and allow 72 hours for refills to be completed.    Today you received port flush with labs     BELOW ARE SYMPTOMS THAT SHOULD BE REPORTED IMMEDIATELY: *FEVER GREATER THAN 100.4 F (38 C) OR HIGHER *CHILLS OR SWEATING *NAUSEA AND VOMITING THAT IS NOT CONTROLLED WITH YOUR NAUSEA MEDICATION *UNUSUAL SHORTNESS OF BREATH *UNUSUAL BRUISING OR BLEEDING *URINARY PROBLEMS (pain or burning when urinating, or frequent urination) *BOWEL PROBLEMS (unusual diarrhea, constipation, pain near the anus) TENDERNESS IN MOUTH AND THROAT WITH OR WITHOUT PRESENCE OF ULCERS (sore throat, sores in mouth, or a toothache) UNUSUAL RASH, SWELLING OR PAIN  UNUSUAL VAGINAL DISCHARGE OR ITCHING   Items with * indicate a potential emergency and should be followed up as soon as possible or go to the Emergency Department if any problems should occur.  Please show the CHEMOTHERAPY ALERT CARD or IMMUNOTHERAPY ALERT CARD at  check-in to the Emergency Department and triage nurse.  Should you have questions after your visit or need to cancel or reschedule your appointment, please contact [facility] [place] - A DEPT OF [facility] [phone]  and follow the prompts.  Office hours are 8:00 a.m. to 4:30 p.m. Monday - Friday. Please note that voicemails left after 4:00 p.m. may not be returned until the following business day.  We are closed weekends and major holidays. You have access to a nurse at all times for urgent questions. Please call the main number to the clinic [phone] and follow the prompts.  For any non-urgent questions, you may also contact your provider using MyChart. We now offer e-Visits for anyone 84 and older to request care online for non-urgent symptoms. For details visit [email or web address].   Also download the MyChart app! Go to the app store, search "MyChart", open the app, select , and log in with your MyChart username and password.

## 2024-04-17 NOTE — Progress Notes (Signed)
 Haley Peterson presented for Portacath access and flush with labs.  Portacath located right chest wall accessed with  H 20 needle.  Good blood return present. Portacath flushed with 20ml NS and 500U/60ml Heparin  and needle removed intact. No bruising or swelling noted at the site. Procedure tolerated well and without incident.     Discharged from clinic ambulatory in stable condition. Alert and oriented x 3. F/U with [facility] as scheduled.

## 2024-04-23 NOTE — Progress Notes (Signed)
 [facility] [address] [place] [address]    Clinic Day:  04/24/2024  Referring physician: Omie Bickers, MD  Patient Care Team: Omie Bickers, MD as PCP - General (Internal Medicine) Scarlette Currier, MD (Inactive) (Internal Medicine) Burma Carrel, MD (Inactive) as Surgeon (General Surgery) Paulett Boros, MD as Medical Oncologist (Medical Oncology) Gerhard Knuckles, RN as Oncology Nurse Navigator (Medical Oncology)   ASSESSMENT & PLAN:   Assessment: 1.  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  In the 2:00 location 3 cm from the nipple, mass measures 0.8 x 0.9 x 0.9 cm.  Measured together, these lesions span 2.4 cm in the 2 o'clock position.  Left axilla is negative for adenopathy. - Biopsy (09/22/2022): 1. left breast 2:00, 1 cmfn-invasive ductal carcinoma, grade 2, ER 100%, PR 95%, Ki-67 20%, HER2 2+ by IHC, HER2 FISH positive with ratio of 2.45 2.  Left breast 2:00 3 cmfn-invasive ductal carcinoma, grade 2, ER/PR 100%, Ki-67 15%, HER2 2+ by IHC, FISH negative - Left mastectomy, SLNB by Dr. Delane Fear on 10/31/2022 - Pathology: 2.5 x 2.1 x 1.2 cm invasive moderately differentiated adenocarcinoma, grade 2, margins free, extensive lymphatic invasion.  0/1 lymph node positive.  Associated focal intermediate grade DCIS. - Adjuvant chemotherapy with weekly paclitaxel  and Herceptin  started on 12/13/2022, 12 cycles of Taxol  completed on 02/28/2023.  1 year Herceptin  completed on 12/06/2023. - Anastrozole  started on 03/20/2023.   2.  Social/family history: - Lives at home by herself and is independent of ADLs and IADLs.  She worked as an Software engineer.  Quit smoking 40 years ago. - Brother had thyroid  cancer.   3.   Stage I right breast cancer: - Diagnosed on 05/15/2006, right breast 11 o'clock position - Right mastectomy (08/15/2006): 1.3 cm IDC, ER 83% positive, PR 84% positive, HER2 negative, Ki-67 4% - Tamoxifen followed by exemestane followed by letrozole  for 10 years.   4.  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  Osteopenia: - Last bone density on 02/03/2021: T-score -1.9. - DEXA scan (04/26/2023) T-score -2.0    Plan: 1.  Stage Ia (T2 N0) HER2 positive left breast cancer: - She is tolerating anastrozole  reasonably well.  Occasional mild hot flashes. - She has completed Herceptin  in January.  Denies any new onset pains. - Bilateral mastectomy sites are within normal limits with no evidence of recurrence.  She has implanted the right mastectomy site. - She may have port discontinued by Dr. Delane Fear.  She will also talk about discontinuing the implant. - Reviewed labs from 04/17/2024: Normal LFTs.  Creatinine 1.25.  CBC grossly normal.  TSH is normal. - Continue anastrozole  daily.  Recommend follow-up in 6 months.   2.  Osteopenia (DEXA scan 04/26/2023 T-score -2.0): - Continue calcium and vitamin D  supplements.  Will check vitamin D  level at next visit. - Will plan on repeating DEXA scan in June 2026.   3.  Peripheral neuropathy: - Tingling in the legs and feet from paclitaxel  is stable.  No medical intervention needed.   4.  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  Last Kenny Peals was in May 2024. - Current hemoglobin is 11.0.  Will check ferritin and iron panel at next visit.      Orders Placed This Encounter  Procedures   CBC with Differential    Standing Status:   Future    Expected Date:   10/21/2024    Expiration Date:   04/24/2025   Comprehensive metabolic panel    Standing Status:   Future    Expected Date:   10/21/2024    Expiration Date:   04/24/2025   VITAMIN D  25 Hydroxy (Vit-D Deficiency, Fractures)     Standing Status:   Future    Expected Date:   10/21/2024    Expiration Date:   04/24/2025   Iron and TIBC ([facility] ONLY)    Standing Status:   Future    Expected Date:   10/21/2024    Expiration Date:   04/24/2025   Ferritin    Standing Status:   Future    Expected Date:   10/21/2024    Expiration Date:   04/24/2025      Hurman Maiden R Teague,acting as a scribe for Paulett Boros, MD.,have documented all relevant documentation on the behalf of Paulett Boros, MD,as directed by  Paulett Boros, MD while in the presence of Paulett Boros, MD.  I, Paulett Boros MD, have reviewed the above documentation for accuracy and completeness, and I agree with the above.    Paulett Boros, MD   6/11/20253:20 PM  CHIEF COMPLAINT:   Diagnosis: stage Ib HER2 positive left breast cancer    Cancer Staging  Breast cancer of upper-outer quadrant of left female breast [facility]) Staging form: Breast, AJCC 8th Edition - Clinical stage from 10/25/2022: Stage IB (cT2, cN0, cM0, G2, ER+, PR+, HER2+) - Unsigned    Prior Therapy: 1. Left mastectomy and SLNB by Dr. Delane Fear on 10/31/2022  2. Trastuzumab  and Herceptin  12/13/22 - 02/28/23  Current Therapy:  Herceptin  and anastrozole     HISTORY OF PRESENT ILLNESS:   Oncology History  Breast cancer of upper-outer quadrant of left female breast (HCC)  10/25/2022 Initial Diagnosis   Breast cancer of upper-outer quadrant of left female breast (HCC)   12/13/2022 -  Chemotherapy   Patient is on Treatment Plan : BREAST Paclitaxel  + Trastuzumab  q7d / Trastuzumab  q21d        INTERVAL HISTORY:   Zakeya is a 78 y.o. female presenting to clinic today for follow up of stage Ib HER2 positive left breast cancer. She was last seen by me on 10/25/23.  Today, she states that she is doing well overall. Her appetite level is at 100%. Her energy level is at 100%.  PAST MEDICAL HISTORY:   Past Medical History: Past Medical  History:  Diagnosis Date   Arthritis    Bone spur    left heel   Breast cancer (HCC)    bilateral   Cancer (HCC)    Bilateral Breast cancer   Cataract    right eye    CKD (chronic kidney disease), stage III (HCC)    mgd by PCP    History of breast cancer    left, right   Hypertension    Personal history of radiation therapy    Pneumonia 10/2012   Port-A-Cath in place 12/06/2022    Surgical History: Past Surgical History:  Procedure Laterality Date   ABDOMINAL HYSTERECTOMY     BIOPSY  04/12/2017   Procedure: BIOPSY;  Surgeon: Ruby Corporal, MD;  [facility];  Service: Endoscopy;;  ascending colon ulcer biopsies   BREAST BIOPSY  Left 09/22/2022   US  LT BREAST BX W LOC DEV EA ADD LESION IMG BX SPEC US  GUIDE 09/22/2022 [facility]   BREAST BIOPSY Left 09/22/2022   US  LT BREAST BX W LOC DEV 1ST LESION IMG BX SPEC US  GUIDE 09/22/2022 [facility]   BREAST LUMPECTOMY  08/28/2001   left   BREAST RECONSTRUCTION Right    implant   CHOLECYSTECTOMY  2002   COLONOSCOPY     COLONOSCOPY N/A 04/12/2017   Procedure: COLONOSCOPY;  Surgeon: Ruby Corporal, MD;  [facility];  Service: Endoscopy;  Laterality: N/A;  830   MASTECTOMY  08/15/2006   right   PARTIAL HYSTERECTOMY  1985   PORTACATH PLACEMENT Right 10/31/2022   Procedure: INSERTION PORT-A-CATH;  Surgeon: Enid Harry, MD;  Location: [facility];  Service: General;  Laterality: Right;   REPLACEMENT TOTAL KNEE  2009, 2010   right-2010, left 2009   SIMPLE MASTECTOMY WITH AXILLARY SENTINEL NODE BIOPSY Left 10/31/2022   Procedure: LEFT MASTECTOMY;  Surgeon: Enid Harry, MD;  Location: [facility];  Service: General;  Laterality: Left;  90 MIN ROOM 2   TISSUE EXPANDER PLACEMENT     TOTAL HIP ARTHROPLASTY Right 10/09/2017   Procedure: RIGHT TOTAL HIP ARTHROPLASTY ANTERIOR APPROACH;  Surgeon: Wendolyn Hamburger, MD;  Location: [facility];  Service: Orthopedics;   Laterality: Right;   TOTAL HIP ARTHROPLASTY Left 09/23/2019   Procedure: Left Anterior Hip Arthroplasty;  Surgeon: Wendolyn Hamburger, MD;  Location: [facility];  Service: Orthopedics;  Laterality: Left;    Social History: Social History   Socioeconomic History   Marital status: Divorced    Spouse name: Not on file   Number of children: Not on file   Years of education: Not on file   Highest education level: Not on file  Occupational History   Not on file  Tobacco Use   Smoking status: Former    Current packs/day: 0.00    Average packs/day: 0.5 packs/day for 5.0 years (2.5 ttl pk-yrs)    Types: Cigarettes    Start date: 09/27/1978    Quit date: 09/28/1983    Years since quitting: 40.6   Smokeless tobacco: Never  Vaping Use   Vaping status: Never Used  Substance and Sexual Activity   Alcohol use: No   Drug use: No   Sexual activity: Not on file  Other Topics Concern   Not on file  Social History Narrative   Not on file   Social Drivers of Health   Financial Resource Strain: Not on file  Food Insecurity: Not on file  Transportation Needs: Not on file  Physical Activity: Not on file  Stress: Not on file  Social Connections: Not on file  Intimate Partner Violence: Not on file    Family History: Family History  Problem Relation Age of Onset   Arthritis Mother    Thyroid  cancer Brother        2010   Colon cancer Neg Hx    Breast cancer Neg Hx     Current Medications:  Current Outpatient Medications:    anastrozole  (ARIMIDEX ) 1 MG tablet, TAKE ONE TABLET BY MOUTH ONCE DAILY., Disp: 30 tablet, Rfl: 6   Ascorbic Acid  (VITAMIN C ) 1000 MG tablet, Take 1,000 mg by mouth daily with lunch., Disp: , Rfl:    Calcium-Vitamin D  (CALTRATE 600 PLUS-VIT D PO), Take 1 tablet by mouth daily with lunch. , Disp: , Rfl:    chlorhexidine  (PERIDEX ) 0.12 % solution, Use  as directed 15 mLs in the mouth or throat 2 (two) times daily., Disp: 120 mL, Rfl: 0   Cholecalciferol  (D-3-5) 125 MCG (5000  UT) capsule, Take 5,000 Units by mouth daily., Disp: , Rfl:    cholecalciferol  (VITAMIN D ) 1000 UNITS tablet, Take 1,000 Units by mouth daily with lunch. , Disp: , Rfl:    Garlic (GARLIQUE PO), Take 1 tablet by mouth daily with lunch., Disp: , Rfl:    lidocaine -prilocaine  (EMLA ) cream, Apply a quarter sized amount to port a cath site and cover with plastic wrap 1 hour prior to infusion appointments, Disp: 30 g, Rfl: 3   losartan  (COZAAR ) 100 MG tablet, Take 100 mg by mouth daily., Disp: , Rfl:    nebivolol (BYSTOLIC) 5 MG tablet, Take 5 mg by mouth daily., Disp: , Rfl:    NON FORMULARY, Balance of Nature - 3 fruits and 3 Vegetables, Disp: , Rfl:    Omega-3 Fatty Acids (FISH OIL) 1200 MG CPDR, Take by mouth., Disp: , Rfl:    prochlorperazine  (COMPAZINE ) 10 MG tablet, Take 1 tablet (10 mg total) by mouth every 6 (six) hours as needed for nausea or vomiting., Disp: 60 tablet, Rfl: 3   TRASTUZUMAB  IV, Inject into the vein once a week. Weekly x 12 weeks, then every 21 days, Disp: , Rfl:    TURMERIC PO, Take 3 capsules by mouth daily with lunch., Disp: , Rfl:    vitamin B-12 (CYANOCOBALAMIN) 100 MCG tablet, Take 100 mcg by mouth daily., Disp: , Rfl:  No current facility-administered medications for this visit.  Facility-Administered Medications Ordered in Other Visits:    sodium chloride  flush (NS) 0.9 % injection 10 mL, 10 mL, Intracatheter, PRN, Moria Brophy, MD, 10 mL at 02/07/23 1359   Allergies: Allergies  Allergen Reactions   Penicillins Other (See Comments)    LOSS OF VISION  Has patient had a PCN reaction causing immediate rash, facial/tongue/throat swelling, SOB or lightheadedness with hypotension: No Has patient had a PCN reaction causing severe rash involving mucus membranes or skin necrosis: No Has patient had a PCN reaction that required hospitalization: No Has patient had a PCN reaction occurring within the last 10 years: No If all of the above answers are NO, then may  proceed with Cephalosporin use.    Oxytetracycline Other (See Comments)    Fever blisters   Tamoxifen Other (See Comments)    GENERAL ILLNESS WITH LIGHTHEADNESS    Aromasin [Exemestane] Other (See Comments)    UNSPECIFIED REACTION  Pt is unsure of what reaction was...   Codeine Other (See Comments)    LIGHTHEADED    REVIEW OF SYSTEMS:   Review of Systems  Constitutional:  Negative for chills, fatigue and fever.  HENT:   Negative for lump/mass, mouth sores, nosebleeds, sore throat and trouble swallowing.   Eyes:  Negative for eye problems.  Respiratory:  Negative for cough and shortness of breath.   Cardiovascular:  Negative for chest pain, leg swelling and palpitations.  Gastrointestinal:  Negative for abdominal pain, constipation, diarrhea, nausea and vomiting.  Genitourinary:  Negative for bladder incontinence, difficulty urinating, dysuria, frequency, hematuria and nocturia.   Musculoskeletal:  Negative for arthralgias, back pain, flank pain, myalgias and neck pain.  Skin:  Negative for itching and rash.  Neurological:  Positive for headaches and numbness. Negative for dizziness.  Hematological:  Does not bruise/bleed easily.  Psychiatric/Behavioral:  Negative for depression, sleep disturbance and suicidal ideas. The patient is not nervous/anxious.   All other systems  reviewed and are negative.    VITALS:   Blood pressure 115/69, pulse 67, temperature 98.7 F (37.1 C), temperature source Tympanic, resp. rate 18, weight 194 lb 0.1 oz (88 kg), SpO2 98%.  Wt Readings from Last 3 Encounters:  04/24/24 194 lb 0.1 oz (88 kg)  02/05/24 190 lb 6 oz (86.4 kg)  12/06/23 192 lb 6.4 oz (87.3 kg)    Body mass index is 33.3 kg/m.  Performance status (ECOG): 1 - Symptomatic but completely ambulatory  PHYSICAL EXAM:   Physical Exam Vitals and nursing note reviewed. Exam conducted with a chaperone present.  Constitutional:      Appearance: Normal appearance.  Cardiovascular:      Rate and Rhythm: Normal rate and regular rhythm.     Pulses: Normal pulses.     Heart sounds: Normal heart sounds.  Pulmonary:     Effort: Pulmonary effort is normal.     Breath sounds: Normal breath sounds.  Abdominal:     Palpations: Abdomen is soft. There is no hepatomegaly, splenomegaly or mass.     Tenderness: There is no abdominal tenderness.  Musculoskeletal:     Right lower leg: No edema.     Left lower leg: No edema.  Lymphadenopathy:     Cervical: No cervical adenopathy.     Right cervical: No superficial, deep or posterior cervical adenopathy.    Left cervical: No superficial, deep or posterior cervical adenopathy.     Upper Body:     Right upper body: No supraclavicular or axillary adenopathy.     Left upper body: No supraclavicular or axillary adenopathy.  Neurological:     General: No focal deficit present.     Mental Status: She is alert and oriented to person, place, and time.  Psychiatric:        Mood and Affect: Mood normal.        Behavior: Behavior normal.     LABS:      Latest Ref Rng & Units 04/17/2024    2:13 PM 12/06/2023   12:25 PM 11/17/2023   10:51 AM  CBC  WBC 4.0 - 10.5 K/uL 8.7  8.1  7.5   Hemoglobin 12.0 - 15.0 g/dL 16.1  09.6  04.5   Hematocrit 36.0 - 46.0 % 34.9  37.3  37.2   Platelets 150 - 400 K/uL 249  296  287       Latest Ref Rng & Units 04/17/2024    2:13 PM 12/06/2023   12:25 PM 11/17/2023   10:51 AM  CMP  Glucose 70 - 99 mg/dL 409  97  93   BUN 8 - 23 mg/dL 30  30  24    Creatinine 0.44 - 1.00 mg/dL 8.11  9.14  7.82   Sodium 135 - 145 mmol/L 137  138  139   Potassium 3.5 - 5.1 mmol/L 4.0  4.1  3.8   Chloride 98 - 111 mmol/L 106  103  107   CO2 22 - 32 mmol/L 26  26  25    Calcium 8.9 - 10.3 mg/dL 9.2  9.5  9.5   Total Protein 6.5 - 8.1 g/dL 5.8  6.7  6.6   Total Bilirubin 0.0 - 1.2 mg/dL 0.4  0.6  0.8   Alkaline Phos 38 - 126 U/L 64  71  64   AST 15 - 41 U/L 14  17  16    ALT 0 - 44 U/L 15  18  15       No  results found  for: CEA1, CEA / No results found for: CEA1, CEA No results found for: PSA1 No results found for: CAN199 No results found for: CAN125  Lab Results  Component Value Date   TOTALPROTELP 6.4 10/26/2022   ALBUMINELP 3.6 10/26/2022   A1GS 0.2 10/26/2022   A2GS 0.6 10/26/2022   BETS 1.1 10/26/2022   GAMS 0.9 10/26/2022   MSPIKE Not Observed 10/26/2022   SPEI Comment 10/26/2022   Lab Results  Component Value Date   TIBC 334 02/21/2023   TIBC 350 02/14/2023   FERRITIN 62 02/21/2023   FERRITIN 67 02/14/2023   IRONPCTSAT 10 (L) 02/21/2023   IRONPCTSAT 10 (L) 02/14/2023   Lab Results  Component Value Date   LDH 140 06/27/2011     STUDIES:   No results found.

## 2024-04-24 ENCOUNTER — Inpatient Hospital Stay: Payer: Self-pay | Admitting: Hematology

## 2024-04-24 VITALS — BP 115/69 | HR 67 | Temp 98.7°F | Resp 18 | Wt 194.0 lb

## 2024-04-24 DIAGNOSIS — C50412 Malignant neoplasm of upper-outer quadrant of left female breast: Secondary | ICD-10-CM

## 2024-04-24 DIAGNOSIS — D508 Other iron deficiency anemias: Secondary | ICD-10-CM | POA: Diagnosis not present

## 2024-04-24 DIAGNOSIS — Z170 Estrogen receptor positive status [ER+]: Secondary | ICD-10-CM

## 2024-04-24 NOTE — Patient Instructions (Signed)

## 2024-04-26 ENCOUNTER — Other Ambulatory Visit: Payer: Self-pay

## 2024-05-06 ENCOUNTER — Ambulatory Visit: Attending: General Surgery

## 2024-05-06 VITALS — Wt 196.4 lb

## 2024-05-06 DIAGNOSIS — Z483 Aftercare following surgery for neoplasm: Secondary | ICD-10-CM | POA: Insufficient documentation

## 2024-05-06 NOTE — Therapy (Signed)
 OUTPATIENT PHYSICAL THERAPY SOZO SCREENING NOTE   Patient Name: Haley Peterson MRN: [id number] DOB:[birth year], 78 y.o., female Today's Date: 05/06/2024  PCP: Shona Norleen PEDLAR, MD REFERRING PROVIDER: Ebbie Cough, MD   PT End of Session - 05/06/24 1531     Visit Number 2   # unchanged due to screen only   PT Start Time 1529    PT Stop Time 1533    PT Time Calculation (min) 4 min    Activity Tolerance Patient tolerated treatment well    Behavior During Therapy [facility] for tasks assessed/performed          Past Medical History:  Diagnosis Date   Arthritis    Bone spur    left heel   Breast cancer (HCC)    bilateral   Cancer (HCC)    Bilateral Breast cancer   Cataract    right eye    CKD (chronic kidney disease), stage III (HCC)    mgd by PCP    History of breast cancer    left, right   Hypertension    Personal history of radiation therapy    Pneumonia 10/2012   Port-A-Cath in place 12/06/2022   Past Surgical History:  Procedure Laterality Date   ABDOMINAL HYSTERECTOMY     BIOPSY  04/12/2017   Procedure: BIOPSY;  Surgeon: Golda Claudis PENNER, MD;  [facility];  Service: Endoscopy;;  ascending colon ulcer biopsies   BREAST BIOPSY Left 09/22/2022   US  LT BREAST BX W LOC DEV EA ADD LESION IMG BX SPEC US  GUIDE 09/22/2022 [facility]   BREAST BIOPSY Left 09/22/2022   US  LT BREAST BX W LOC DEV 1ST LESION IMG BX SPEC US  GUIDE 09/22/2022 [facility]   BREAST LUMPECTOMY  08/28/2001   left   BREAST RECONSTRUCTION Right    implant   CHOLECYSTECTOMY  2002   COLONOSCOPY     COLONOSCOPY N/A 04/12/2017   Procedure: COLONOSCOPY;  Surgeon: Golda Claudis PENNER, MD;  [facility];  Service: Endoscopy;  Laterality: N/A;  830   MASTECTOMY  08/15/2006   right   PARTIAL HYSTERECTOMY  1985   PORTACATH PLACEMENT Right 10/31/2022   Procedure: INSERTION PORT-A-CATH;  Surgeon: Ebbie Cough, MD;  Location: [facility];  Service: General;   Laterality: Right;   REPLACEMENT TOTAL KNEE  2009, 2010   right-2010, left 2009   SIMPLE MASTECTOMY WITH AXILLARY SENTINEL NODE BIOPSY Left 10/31/2022   Procedure: LEFT MASTECTOMY;  Surgeon: Ebbie Cough, MD;  Location: [facility];  Service: General;  Laterality: Left;  90 MIN ROOM 2   TISSUE EXPANDER PLACEMENT     TOTAL HIP ARTHROPLASTY Right 10/09/2017   Procedure: RIGHT TOTAL HIP ARTHROPLASTY ANTERIOR APPROACH;  Surgeon: Liam Lerner, MD;  Location[facility];  Service: Orthopedics;  Laterality: Right;   TOTAL HIP ARTHROPLASTY Left 09/23/2019   Procedure: Left Anterior Hip Arthroplasty;  Surgeon: Liam Lerner, MD;  Location[facility];  Service: Orthopedics;  Laterality: Left;   Patient Active Problem List   Diagnosis Date Noted   Iron deficiency anemia 02/21/2023   Port-A-Cath in place 12/06/2022   S/P left mastectomy 10/31/2022   Breast cancer of upper-outer quadrant of left female breast (HCC) 10/25/2022   S/P hip replacement, left 09/23/2019   Osteoarthritis of left hip 09/20/2019   Primary osteoarthritis of right hip 10/09/2017   Osteoarthritis of right hip 10/04/2017   Special screening for malignant neoplasms, colon 12/30/2016   Breast cancer (HCC) 06/27/2011  Hx Breast cancer, Right, multifocal IDC, receptor +, Her2 - 06/07/2006    Class: Stage 1   Hx Breast cancer (DCIS), Right, Stage 0,  08/08/2001    REFERRING DIAG: left breast cancer at risk for lymphedema  THERAPY DIAG: Aftercare following surgery for neoplasm  PERTINENT HISTORY: Patient was diagnosed on 10/13/22 with left breast cancer in 2 locations measuring around 2.5cm in total. Negative axillary US . grade 2 IDC. It is ER/PR/HER2 positive with a Ki67 of 20%.  Prior hx of Lt lumpectomy with radiation and SLNB with 1 negative lymph node removed in 2002 and Rt mastectomy with implant in 2007. Lt mastectomy 10/31/22 with 1 negative node removed.   PRECAUTIONS: left UE Lymphedema risk  SUBJECTIVE: Pt  returns for her 3 month L-Dex screen.   PAIN:  Are you having pain? No  SOZO SCREENING: Patient was assessed today using the SOZO machine to determine the lymphedema index score. This was compared to her baseline score. It was determined that she is within the recommended range when compared to her baseline and no further action is needed at this time. She will continue SOZO screenings. These are done every 3 months for 2 years post operatively followed by every 6 months for 2 years, and then annually.  P: SOZO Q78months until at least 10/31/24 and Q26months until 10/31/26   L-DEX FLOWSHEETS - 05/06/24 1500       L-DEX LYMPHEDEMA SCREENING   Measurement Type Unilateral    L-DEX MEASUREMENT EXTREMITY Upper Extremity    POSITION  Standing    DOMINANT SIDE Right    At Risk Side Left    BASELINE SCORE (UNILATERAL) 2.8    L-DEX SCORE (UNILATERAL) 3.4    VALUE CHANGE (UNILAT) 0.6           Aden Berwyn Caldron, PTA 05/06/2024, 3:32 PM

## 2024-05-09 ENCOUNTER — Encounter: Payer: Self-pay | Admitting: Hematology

## 2024-05-12 ENCOUNTER — Other Ambulatory Visit: Payer: Self-pay | Admitting: Hematology

## 2024-05-13 ENCOUNTER — Encounter: Payer: Self-pay | Admitting: Hematology

## 2024-05-16 DIAGNOSIS — H40053 Ocular hypertension, bilateral: Secondary | ICD-10-CM | POA: Diagnosis not present

## 2024-05-16 DIAGNOSIS — H43813 Vitreous degeneration, bilateral: Secondary | ICD-10-CM | POA: Diagnosis not present

## 2024-05-16 DIAGNOSIS — H35033 Hypertensive retinopathy, bilateral: Secondary | ICD-10-CM | POA: Diagnosis not present

## 2024-05-16 DIAGNOSIS — H5202 Hypermetropia, left eye: Secondary | ICD-10-CM | POA: Diagnosis not present

## 2024-07-18 DIAGNOSIS — E782 Mixed hyperlipidemia: Secondary | ICD-10-CM | POA: Diagnosis not present

## 2024-07-18 DIAGNOSIS — R7301 Impaired fasting glucose: Secondary | ICD-10-CM | POA: Diagnosis not present

## 2024-07-24 DIAGNOSIS — D631 Anemia in chronic kidney disease: Secondary | ICD-10-CM | POA: Diagnosis not present

## 2024-07-24 DIAGNOSIS — E7800 Pure hypercholesterolemia, unspecified: Secondary | ICD-10-CM | POA: Diagnosis not present

## 2024-07-24 DIAGNOSIS — N1831 Chronic kidney disease, stage 3a: Secondary | ICD-10-CM | POA: Diagnosis not present

## 2024-07-24 DIAGNOSIS — R7301 Impaired fasting glucose: Secondary | ICD-10-CM | POA: Diagnosis not present

## 2024-07-24 DIAGNOSIS — E782 Mixed hyperlipidemia: Secondary | ICD-10-CM | POA: Diagnosis not present

## 2024-07-24 DIAGNOSIS — I129 Hypertensive chronic kidney disease with stage 1 through stage 4 chronic kidney disease, or unspecified chronic kidney disease: Secondary | ICD-10-CM | POA: Diagnosis not present

## 2024-07-24 DIAGNOSIS — H269 Unspecified cataract: Secondary | ICD-10-CM | POA: Diagnosis not present

## 2024-07-24 DIAGNOSIS — I10 Essential (primary) hypertension: Secondary | ICD-10-CM | POA: Diagnosis not present

## 2024-07-24 DIAGNOSIS — M1990 Unspecified osteoarthritis, unspecified site: Secondary | ICD-10-CM | POA: Diagnosis not present

## 2024-07-24 DIAGNOSIS — C50412 Malignant neoplasm of upper-outer quadrant of left female breast: Secondary | ICD-10-CM | POA: Diagnosis not present

## 2024-07-24 DIAGNOSIS — M8580 Other specified disorders of bone density and structure, unspecified site: Secondary | ICD-10-CM | POA: Diagnosis not present

## 2024-07-24 DIAGNOSIS — Z23 Encounter for immunization: Secondary | ICD-10-CM | POA: Diagnosis not present

## 2024-08-05 ENCOUNTER — Ambulatory Visit: Attending: General Surgery

## 2024-08-05 VITALS — Wt 199.2 lb

## 2024-08-05 DIAGNOSIS — Z483 Aftercare following surgery for neoplasm: Secondary | ICD-10-CM | POA: Insufficient documentation

## 2024-08-05 NOTE — Therapy (Signed)
 OUTPATIENT PHYSICAL THERAPY SOZO SCREENING NOTE   Patient Name: Haley Peterson MRN: [id number] DOB:1946-07-30, 78 y.o., female Today's Date: 08/05/2024  PCP: Shona Norleen PEDLAR, MD REFERRING PROVIDER: Ebbie Cough, MD   PT End of Session - 08/05/24 1614     Visit Number 2   # unchanged due to screen only   PT Start Time 1613    PT Stop Time 1617    PT Time Calculation (min) 4 min    Activity Tolerance Patient tolerated treatment well    Behavior During Therapy [facility] for tasks assessed/performed          Past Medical History:  Diagnosis Date   Arthritis    Bone spur    left heel   Breast cancer (HCC)    bilateral   Cancer (HCC)    Bilateral Breast cancer   Cataract    right eye    CKD (chronic kidney disease), stage III (HCC)    mgd by PCP    History of breast cancer    left, right   Hypertension    Personal history of radiation therapy    Pneumonia 10/2012   Port-A-Cath in place 12/06/2022   Past Surgical History:  Procedure Laterality Date   ABDOMINAL HYSTERECTOMY     BIOPSY  04/12/2017   Procedure: BIOPSY;  Surgeon: Golda Claudis PENNER, MD;  [facility];  Service: Endoscopy;;  ascending colon ulcer biopsies   BREAST BIOPSY Left 09/22/2022   US  LT BREAST BX W LOC DEV EA ADD LESION IMG BX SPEC US  GUIDE 09/22/2022 [facility]   BREAST BIOPSY Left 09/22/2022   US  LT BREAST BX W LOC DEV 1ST LESION IMG BX SPEC US  GUIDE 09/22/2022 [facility]   BREAST LUMPECTOMY  08/28/2001   left   BREAST RECONSTRUCTION Right    implant   CHOLECYSTECTOMY  2002   COLONOSCOPY     COLONOSCOPY N/A 04/12/2017   Procedure: COLONOSCOPY;  Surgeon: Golda Claudis PENNER, MD;  [facility];  Service: Endoscopy;  Laterality: N/A;  830   MASTECTOMY  08/15/2006   right   PARTIAL HYSTERECTOMY  1985   PORTACATH PLACEMENT Right 10/31/2022   Procedure: INSERTION PORT-A-CATH;  Surgeon: Ebbie Cough, MD;  Location: [facility];  Service: General;   Laterality: Right;   REPLACEMENT TOTAL KNEE  2009, 2010   right-2010, left 2009   SIMPLE MASTECTOMY WITH AXILLARY SENTINEL NODE BIOPSY Left 10/31/2022   Procedure: LEFT MASTECTOMY;  Surgeon: Ebbie Cough, MD;  Location: [facility];  Service: General;  Laterality: Left;  90 MIN ROOM 2   TISSUE EXPANDER PLACEMENT     TOTAL HIP ARTHROPLASTY Right 10/09/2017   Procedure: RIGHT TOTAL HIP ARTHROPLASTY ANTERIOR APPROACH;  Surgeon: Liam Lerner, MD;  Location: [facility];  Service: Orthopedics;  Laterality: Right;   TOTAL HIP ARTHROPLASTY Left 09/23/2019   Procedure: Left Anterior Hip Arthroplasty;  Surgeon: Liam Lerner, MD;  Location: [facility];  Service: Orthopedics;  Laterality: Left;   Patient Active Problem List   Diagnosis Date Noted   Iron deficiency anemia 02/21/2023   Port-A-Cath in place 12/06/2022   S/P left mastectomy 10/31/2022   Breast cancer of upper-outer quadrant of left female breast (HCC) 10/25/2022   S/P hip replacement, left 09/23/2019   Osteoarthritis of left hip 09/20/2019   Primary osteoarthritis of right hip 10/09/2017   Osteoarthritis of right hip 10/04/2017   Special screening for malignant neoplasms, colon 12/30/2016   Breast cancer (HCC) 06/27/2011  Hx Breast cancer, Right, multifocal IDC, receptor +, Her2 - 06/07/2006    Class: Stage 1   Hx Breast cancer (DCIS), Right, Stage 0,  08/08/2001    REFERRING DIAG: left breast cancer at risk for lymphedema  THERAPY DIAG: Aftercare following surgery for neoplasm  PERTINENT HISTORY: Patient was diagnosed on 10/13/22 with left breast cancer in 2 locations measuring around 2.5cm in total. Negative axillary US . grade 2 IDC. It is ER/PR/HER2 positive with a Ki67 of 20%.  Prior hx of Lt lumpectomy with radiation and SLNB with 1 negative lymph node removed in 2002 and Rt mastectomy with implant in 2007. Lt mastectomy 10/31/22 with 1 negative node removed.   PRECAUTIONS: left UE Lymphedema risk  SUBJECTIVE: Pt  returns for her 3 month L-Dex screen.   PAIN:  Are you having pain? No  SOZO SCREENING: Patient was assessed today using the SOZO machine to determine the lymphedema index score. This was compared to her baseline score. It was determined that she is within the recommended range when compared to her baseline and no further action is needed at this time. She will continue SOZO screenings. These are done every 3 months for 2 years post operatively followed by every 6 months for 2 years, and then annually.  P: SOZO Q34months until at least 10/31/24 and Q40months until 10/31/26   L-DEX FLOWSHEETS - 08/05/24 1600       L-DEX LYMPHEDEMA SCREENING   Measurement Type Unilateral    L-DEX MEASUREMENT EXTREMITY Upper Extremity    POSITION  Standing    DOMINANT SIDE Right    At Risk Side Left    BASELINE SCORE (UNILATERAL) 2.8    L-DEX SCORE (UNILATERAL) 5.5    VALUE CHANGE (UNILAT) 2.7         P: One more 3 month L-Dex screen and then transition to 6 months.   Aden Berwyn Caldron, PTA 08/05/2024, 4:16 PM

## 2024-08-06 ENCOUNTER — Other Ambulatory Visit: Payer: Self-pay

## 2024-08-21 DIAGNOSIS — N1831 Chronic kidney disease, stage 3a: Secondary | ICD-10-CM | POA: Diagnosis not present

## 2024-10-21 ENCOUNTER — Inpatient Hospital Stay

## 2024-10-21 ENCOUNTER — Inpatient Hospital Stay: Attending: Oncology

## 2024-10-21 DIAGNOSIS — Z853 Personal history of malignant neoplasm of breast: Secondary | ICD-10-CM | POA: Diagnosis not present

## 2024-10-21 DIAGNOSIS — M8580 Other specified disorders of bone density and structure, unspecified site: Secondary | ICD-10-CM | POA: Insufficient documentation

## 2024-10-21 DIAGNOSIS — Z87891 Personal history of nicotine dependence: Secondary | ICD-10-CM | POA: Insufficient documentation

## 2024-10-21 DIAGNOSIS — Z79899 Other long term (current) drug therapy: Secondary | ICD-10-CM | POA: Diagnosis not present

## 2024-10-21 DIAGNOSIS — D631 Anemia in chronic kidney disease: Secondary | ICD-10-CM | POA: Diagnosis present

## 2024-10-21 DIAGNOSIS — D509 Iron deficiency anemia, unspecified: Secondary | ICD-10-CM | POA: Insufficient documentation

## 2024-10-21 DIAGNOSIS — G629 Polyneuropathy, unspecified: Secondary | ICD-10-CM | POA: Insufficient documentation

## 2024-10-21 DIAGNOSIS — Z86000 Personal history of in-situ neoplasm of breast: Secondary | ICD-10-CM | POA: Diagnosis not present

## 2024-10-21 DIAGNOSIS — N189 Chronic kidney disease, unspecified: Secondary | ICD-10-CM | POA: Insufficient documentation

## 2024-10-21 DIAGNOSIS — C50412 Malignant neoplasm of upper-outer quadrant of left female breast: Secondary | ICD-10-CM | POA: Insufficient documentation

## 2024-10-21 DIAGNOSIS — Z1721 Progesterone receptor positive status: Secondary | ICD-10-CM | POA: Insufficient documentation

## 2024-10-21 DIAGNOSIS — Z79811 Long term (current) use of aromatase inhibitors: Secondary | ICD-10-CM | POA: Diagnosis not present

## 2024-10-21 DIAGNOSIS — Z1731 Human epidermal growth factor receptor 2 positive status: Secondary | ICD-10-CM | POA: Diagnosis not present

## 2024-10-21 DIAGNOSIS — Z170 Estrogen receptor positive status [ER+]: Secondary | ICD-10-CM | POA: Insufficient documentation

## 2024-10-21 LAB — IRON AND TIBC
Iron: 59 ug/dL (ref 28–170)
Saturation Ratios: 20 % (ref 10.4–31.8)
TIBC: 301 ug/dL (ref 250–450)
UIBC: 242 ug/dL

## 2024-10-21 LAB — VITAMIN D 25 HYDROXY (VIT D DEFICIENCY, FRACTURES): Vit D, 25-Hydroxy: 92.32 ng/mL (ref 30–100)

## 2024-10-21 LAB — COMPREHENSIVE METABOLIC PANEL WITH GFR
ALT: 22 U/L (ref 0–44)
AST: 17 U/L (ref 15–41)
Albumin: 4.1 g/dL (ref 3.5–5.0)
Alkaline Phosphatase: 81 U/L (ref 38–126)
Anion gap: 7 (ref 5–15)
BUN: 27 mg/dL — ABNORMAL HIGH (ref 8–23)
CO2: 29 mmol/L (ref 22–32)
Calcium: 9.9 mg/dL (ref 8.9–10.3)
Chloride: 104 mmol/L (ref 98–111)
Creatinine, Ser: 1.23 mg/dL — ABNORMAL HIGH (ref 0.44–1.00)
GFR, Estimated: 45 mL/min — ABNORMAL LOW (ref >60)
Glucose, Bld: 75 mg/dL (ref 70–99)
Potassium: 4.4 mmol/L (ref 3.5–5.1)
Sodium: 140 mmol/L (ref 135–145)
Total Bilirubin: 0.4 mg/dL (ref 0.0–1.2)
Total Protein: 6.7 g/dL (ref 6.5–8.1)

## 2024-10-21 LAB — CBC WITH DIFFERENTIAL/PLATELET
Abs Immature Granulocytes: 0.01 K/uL (ref 0.00–0.07)
Basophils Absolute: 0 K/uL (ref 0.0–0.1)
Basophils Relative: 1 %
Eosinophils Absolute: 0.2 K/uL (ref 0.0–0.5)
Eosinophils Relative: 3 %
HCT: 39.4 % (ref 36.0–46.0)
Hemoglobin: 12.6 g/dL (ref 12.0–15.0)
Immature Granulocytes: 0 %
Lymphocytes Relative: 20 %
Lymphs Abs: 1.7 K/uL (ref 0.7–4.0)
MCH: 29 pg (ref 26.0–34.0)
MCHC: 32 g/dL (ref 30.0–36.0)
MCV: 90.8 fL (ref 80.0–100.0)
Monocytes Absolute: 0.7 K/uL (ref 0.1–1.0)
Monocytes Relative: 8 %
Neutro Abs: 5.9 K/uL (ref 1.7–7.7)
Neutrophils Relative %: 68 %
Platelets: 289 K/uL (ref 150–400)
RBC: 4.34 MIL/uL (ref 3.87–5.11)
RDW: 13.1 % (ref 11.5–15.5)
WBC: 8.5 K/uL (ref 4.0–10.5)
nRBC: 0 % (ref 0.0–0.2)

## 2024-10-21 LAB — FERRITIN: Ferritin: 220 ng/mL (ref 11–307)

## 2024-10-21 LAB — MAGNESIUM: Magnesium: 2.3 mg/dL (ref 1.7–2.4)

## 2024-10-21 NOTE — Progress Notes (Signed)
 Patients port flushed without difficulty.  Good blood return noted with no bruising or swelling noted at site.  Labs drawn per orders.  VSS with discharge and left in satisfactory condition with no s/s of distress noted. All follow ups as scheduled.       Haley Peterson

## 2024-10-28 ENCOUNTER — Inpatient Hospital Stay: Admitting: Physician Assistant

## 2024-11-01 ENCOUNTER — Inpatient Hospital Stay: Admitting: Oncology

## 2024-11-01 ENCOUNTER — Encounter: Payer: Self-pay | Admitting: Oncology

## 2024-11-01 VITALS — BP 132/69 | HR 82 | Temp 98.0°F | Resp 18 | Wt 203.3 lb

## 2024-11-01 DIAGNOSIS — Z95828 Presence of other vascular implants and grafts: Secondary | ICD-10-CM | POA: Diagnosis not present

## 2024-11-01 DIAGNOSIS — Z79811 Long term (current) use of aromatase inhibitors: Secondary | ICD-10-CM | POA: Insufficient documentation

## 2024-11-01 DIAGNOSIS — C50412 Malignant neoplasm of upper-outer quadrant of left female breast: Secondary | ICD-10-CM

## 2024-11-01 DIAGNOSIS — N189 Chronic kidney disease, unspecified: Secondary | ICD-10-CM | POA: Diagnosis not present

## 2024-11-01 DIAGNOSIS — D508 Other iron deficiency anemias: Secondary | ICD-10-CM | POA: Diagnosis not present

## 2024-11-01 DIAGNOSIS — M8580 Other specified disorders of bone density and structure, unspecified site: Secondary | ICD-10-CM | POA: Insufficient documentation

## 2024-11-01 DIAGNOSIS — Z170 Estrogen receptor positive status [ER+]: Secondary | ICD-10-CM

## 2024-11-01 NOTE — Progress Notes (Signed)
 "  [facility] [address] [place] [address]    Clinic Day:  11/01/2024  Referring physician: Shona Norleen PEDLAR, MD  Patient Care Team: Shona Norleen PEDLAR, MD as PCP - General (Internal Medicine) Ilean Anes, MD (Inactive) (Internal Medicine) Merrilyn Handler, MD (Inactive) as Surgeon (General Surgery) Celestia Joesph SQUIBB, RN as Oncology Nurse Navigator (Medical Oncology)   ASSESSMENT & PLAN:   Assessment: 1.  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  In the 2:00 location 3 cm from the nipple, mass measures 0.8 x 0.9 x 0.9 cm.  Measured together, these lesions span 2.4 cm in the 2 o'clock position.  Left axilla is negative for adenopathy. - Biopsy (09/22/2022): 1. left breast 2:00, 1 cmfn-invasive ductal carcinoma, grade 2, ER 100%, PR 95%, Ki-67 20%, HER2 2+ by IHC, HER2 FISH positive with ratio of 2.45 2.  Left breast 2:00 3 cmfn-invasive ductal carcinoma, grade 2, ER/PR 100%, Ki-67 15%, HER2 2+ by IHC, FISH negative - Left mastectomy, SLNB by Dr. Ebbie on 10/31/2022 - Pathology: 2.5 x 2.1 x 1.2 cm invasive moderately differentiated adenocarcinoma, grade 2, margins free, extensive lymphatic invasion.  0/1 lymph node positive.  Associated focal intermediate grade DCIS. - Adjuvant chemotherapy with weekly paclitaxel  and Herceptin  started on 12/13/2022, 12 cycles of Taxol  completed on 02/28/2023.  1 year Herceptin  completed on 12/06/2023. - Anastrozole  started on 03/20/2023.   2.  Social/family history: - Lives at home by herself and is independent of ADLs and IADLs.  She worked as an Software Engineer.  Quit smoking 40 years ago. - Brother had thyroid  cancer.   3.  Stage I right breast cancer: - Diagnosed on 05/15/2006, right breast  11 o'clock position - Right mastectomy (08/15/2006): 1.3 cm IDC, ER 83% positive, PR 84% positive, HER2 negative, Ki-67 4% - Tamoxifen followed by exemestane followed by letrozole  for 10 years.   4.  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  Osteopenia: - Last bone density on 02/03/2021: T-score -1.9. - DEXA scan (04/26/2023) T-score -2.0    Plan: 1.  Stage Ia (T2 N0) HER2 positive left breast cancer: - She is tolerating anastrozole  reasonably well.  Occasional mild hot flashes. - She has completed Herceptin  in January 2025.  Denies any new onset pains. - Bilateral mastectomy sites are within normal limits with no evidence of recurrence.  - PAC remains in place.  We discussed taking this out but she prefers to leave this in at this time.  She may contact Dr. Ebbie for removal if she changes her mind. - Reviewed labs from 04/17/2024: Normal LFTs.  Creatinine 1.23.  CBC grossly normal.   - Continue anastrozole  daily.  Recommend follow-up in 6 months.   2.  Osteopenia (DEXA scan 04/26/2023 T-score -2.0): - Continue calcium and vitamin D  supplements.  Vitamin D  level normal. - Will plan on repeating DEXA scan in June 2026 and I have ordered this today.   3.  Peripheral neuropathy: - Tingling in the legs and feet from paclitaxel  is stable.  No medical intervention needed.   4.  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  Last Allegra was in May 2024. - Hemoglobin and  iron panel are normal.   Follow-up: Lab including a CBC, CMP, ferritin, iron panel in 6 months.  Visit 1 week after labs.  Haley Fenster, NP   12/19/20251:37 PM  CHIEF COMPLAINT:   Diagnosis: stage Ib HER2 positive left breast cancer    Cancer Staging  Breast cancer of upper-outer quadrant of left female breast [facility]) Staging form: Breast, AJCC 8th Edition - Clinical stage from 10/25/2022: Stage IB (cT2, cN0, cM0, G2, ER+, PR+, HER2+) -  Unsigned    Prior Therapy: 1. Left mastectomy and SLNB by Dr. Ebbie on 10/31/2022  2. Trastuzumab  and Herceptin  12/13/22 - 02/28/23  Current Therapy:  Anastrozole     HISTORY OF PRESENT ILLNESS:   Oncology History  Breast cancer of upper-outer quadrant of left female breast (HCC)  10/25/2022 Initial Diagnosis   Breast cancer of upper-outer quadrant of left female breast (HCC)   12/13/2022 -  Chemotherapy   Patient is on Treatment Plan : BREAST Paclitaxel  + Trastuzumab  q7d / Trastuzumab  q21d        INTERVAL HISTORY:   Haley Peterson is a 78 y.o. female presenting to clinic today for follow up of stage Ib HER2 positive left breast cancer.   Today, she states that she is doing well overall. Her appetite level is at 100%. Her energy level is at 100%.  She continues on anastrozole  and is tolerating this well.  She does note the occasional warm sensation/redness to her face which is short-lived.  No significant arthralgias.  No other new symptoms reported today.  She continues to take calcium and vitamin D  for bone health.  PAST MEDICAL HISTORY:   Past Medical History: Past Medical History:  Diagnosis Date   Arthritis    Bone spur    left heel   Breast cancer (HCC)    bilateral   Cancer (HCC)    Bilateral Breast cancer   Cataract    right eye    CKD (chronic kidney disease), stage III (HCC)    mgd by PCP    History of breast cancer    left, right   Hypertension    Personal history of radiation therapy    Pneumonia 10/2012   Port-A-Cath in place 12/06/2022    Surgical History: Past Surgical History:  Procedure Laterality Date   ABDOMINAL HYSTERECTOMY     BIOPSY  04/12/2017   Procedure: BIOPSY;  Surgeon: Golda Claudis PENNER, MD;  [facility];  Service: Endoscopy;;  ascending colon ulcer biopsies   BREAST BIOPSY Left 09/22/2022   US  LT BREAST BX W LOC DEV EA ADD LESION IMG BX SPEC US  GUIDE 09/22/2022 [facility]   BREAST BIOPSY Left 09/22/2022   US  LT BREAST BX W  LOC DEV 1ST LESION IMG BX SPEC US  GUIDE 09/22/2022 [facility]   BREAST LUMPECTOMY  08/28/2001   left   BREAST RECONSTRUCTION Right    implant   CHOLECYSTECTOMY  2002   COLONOSCOPY     COLONOSCOPY N/A 04/12/2017   Procedure: COLONOSCOPY;  Surgeon: Golda Claudis PENNER, MD;  [facility];  Service: Endoscopy;  Laterality: N/A;  830   MASTECTOMY  08/15/2006   right   PARTIAL HYSTERECTOMY  1985   PORTACATH PLACEMENT Right 10/31/2022   Procedure: INSERTION PORT-A-CATH;  Surgeon: Ebbie Cough, MD;  Location: [facility];  Service: General;  Laterality: Right;   REPLACEMENT TOTAL KNEE  2009, 2010   right-2010, left 2009   SIMPLE MASTECTOMY WITH AXILLARY SENTINEL NODE BIOPSY Left 10/31/2022  Procedure: LEFT MASTECTOMY;  Surgeon: Ebbie Cough, MD;  Location: [facility];  Service: General;  Laterality: Left;  90 MIN ROOM 2   TISSUE EXPANDER PLACEMENT     TOTAL HIP ARTHROPLASTY Right 10/09/2017   Procedure: RIGHT TOTAL HIP ARTHROPLASTY ANTERIOR APPROACH;  Surgeon: Liam Lerner, MD;  Location: [facility];  Service: Orthopedics;  Laterality: Right;   TOTAL HIP ARTHROPLASTY Left 09/23/2019   Procedure: Left Anterior Hip Arthroplasty;  Surgeon: Liam Lerner, MD;  Location: [facility];  Service: Orthopedics;  Laterality: Left;    Social History: Social History   Socioeconomic History   Marital status: Divorced    Spouse name: Not on file   Number of children: Not on file   Years of education: Not on file   Highest education level: Not on file  Occupational History   Not on file  Tobacco Use   Smoking status: Former    Current packs/day: 0.00    Average packs/day: 0.5 packs/day for 5.0 years (2.5 ttl pk-yrs)    Types: Cigarettes    Start date: 09/27/1978    Quit date: 09/28/1983    Years since quitting: 41.1   Smokeless tobacco: Never  Vaping Use   Vaping status: Never Used  Substance and Sexual Activity   Alcohol use: No   Drug use: No    Sexual activity: Not on file  Other Topics Concern   Not on file  Social History Narrative   Not on file   Social Drivers of Health   Tobacco Use: Medium Risk (11/01/2024)   Patient History    Smoking Tobacco Use: Former    Smokeless Tobacco Use: Never    Passive Exposure: Not on Actuary Strain: Not on file  Food Insecurity: Not on file  Transportation Needs: Not on file  Physical Activity: Not on file  Stress: Not on file  Social Connections: Not on file  Intimate Partner Violence: Not on file  Depression (PHQ2-9): Low Risk (11/01/2024)   Depression (PHQ2-9)    PHQ-2 Score: 0  Alcohol Screen: Not on file  Housing: Not on file  Utilities: Not on file  Health Literacy: Not on file    Family History: Family History  Problem Relation Age of Onset   Arthritis Mother    Thyroid  cancer Brother        2010   Colon cancer Neg Hx    Breast cancer Neg Hx     Current Medications:  Current Outpatient Medications:    anastrozole  (ARIMIDEX ) 1 MG tablet, TAKE ONE TABLET BY MOUTH ONCE DAILY., Disp: 30 tablet, Rfl: 6   Ascorbic Acid  (VITAMIN C ) 1000 MG tablet, Take 1,000 mg by mouth daily with lunch., Disp: , Rfl:    Calcium-Vitamin D  (CALTRATE 600 PLUS-VIT D PO), Take 1 tablet by mouth daily with lunch. , Disp: , Rfl:    chlorhexidine  (PERIDEX ) 0.12 % solution, Use as directed 15 mLs in the mouth or throat 2 (two) times daily., Disp: 120 mL, Rfl: 0   Cholecalciferol  (D-3-5) 125 MCG (5000 UT) capsule, Take 5,000 Units by mouth daily., Disp: , Rfl:    cholecalciferol  (VITAMIN D ) 1000 UNITS tablet, Take 1,000 Units by mouth daily with lunch. , Disp: , Rfl:    FLUZONE HIGH-DOSE 0.5 ML injection, , Disp: , Rfl:    Garlic (GARLIQUE PO), Take 1 tablet by mouth daily with lunch., Disp: , Rfl:    lidocaine -prilocaine  (EMLA ) cream, Apply a quarter sized amount to port a cath  site and cover with plastic wrap 1 hour prior to infusion appointments, Disp: 30 g, Rfl: 3   losartan   (COZAAR ) 100 MG tablet, Take 100 mg by mouth daily., Disp: , Rfl:    nebivolol (BYSTOLIC) 5 MG tablet, Take 5 mg by mouth daily., Disp: , Rfl:    NON FORMULARY, Balance of Nature - 3 fruits and 3 Vegetables, Disp: , Rfl:    Omega-3 Fatty Acids (FISH OIL) 1200 MG CPDR, Take by mouth., Disp: , Rfl:    prochlorperazine  (COMPAZINE ) 10 MG tablet, Take 1 tablet (10 mg total) by mouth every 6 (six) hours as needed for nausea or vomiting., Disp: 60 tablet, Rfl: 3   TURMERIC PO, Take 3 capsules by mouth daily with lunch., Disp: , Rfl:    vitamin B-12 (CYANOCOBALAMIN) 100 MCG tablet, Take 100 mcg by mouth daily., Disp: , Rfl:    TRASTUZUMAB  IV, Inject into the vein once a week. Weekly x 12 weeks, then every 21 days (Patient not taking: Reported on 11/01/2024), Disp: , Rfl:  No current facility-administered medications for this visit.  Facility-Administered Medications Ordered in Other Visits:    sodium chloride  flush (NS) 0.9 % injection 10 mL, 10 mL, Intracatheter, PRN, Katragadda, Sreedhar, MD, 10 mL at 02/07/23 1359   Allergies: Allergies  Allergen Reactions   Penicillins Other (See Comments)    LOSS OF VISION  Has patient had a PCN reaction causing immediate rash, facial/tongue/throat swelling, SOB or lightheadedness with hypotension: No Has patient had a PCN reaction causing severe rash involving mucus membranes or skin necrosis: No Has patient had a PCN reaction that required hospitalization: No Has patient had a PCN reaction occurring within the last 10 years: No If all of the above answers are NO, then may proceed with Cephalosporin use.    Oxytetracycline Other (See Comments)    Fever blisters   Tamoxifen Other (See Comments)    GENERAL ILLNESS WITH LIGHTHEADNESS    Aromasin [Exemestane] Other (See Comments)    UNSPECIFIED REACTION  Pt is unsure of what reaction was...   Codeine Other (See Comments)    LIGHTHEADED    REVIEW OF SYSTEMS:   Review of Systems  Constitutional:   Negative for chills, fatigue and fever.  HENT:   Negative for lump/mass, mouth sores, nosebleeds, sore throat and trouble swallowing.   Eyes:  Negative for eye problems.  Respiratory:  Negative for cough and shortness of breath.   Cardiovascular:  Negative for chest pain, leg swelling and palpitations.  Gastrointestinal:  Negative for abdominal pain, constipation, diarrhea, nausea and vomiting.  Genitourinary:  Negative for bladder incontinence, difficulty urinating, dysuria, frequency, hematuria and nocturia.   Musculoskeletal:  Negative for arthralgias, back pain, flank pain, myalgias and neck pain.  Skin:  Negative for itching and rash.  Neurological:  Positive for numbness. Negative for dizziness and headaches.  Hematological:  Does not bruise/bleed easily.  Psychiatric/Behavioral:  Negative for depression, sleep disturbance and suicidal ideas. The patient is not nervous/anxious.   All other systems reviewed and are negative.    VITALS:   Blood pressure 132/69, pulse 82, temperature 98 F (36.7 C), resp. rate 18, weight 203 lb 4.2 oz (92.2 kg), SpO2 95%.  Wt Readings from Last 3 Encounters:  11/01/24 203 lb 4.2 oz (92.2 kg)  08/05/24 199 lb 4 oz (90.4 kg)  05/06/24 196 lb 6 oz (89.1 kg)    Body mass index is 34.89 kg/m.  Performance status (ECOG): 1 - Symptomatic but completely ambulatory  PHYSICAL EXAM:   Physical Exam Vitals and nursing note reviewed. Exam conducted with a chaperone present.  Constitutional:      Appearance: Normal appearance.  Cardiovascular:     Rate and Rhythm: Normal rate and regular rhythm.     Pulses: Normal pulses.     Heart sounds: Normal heart sounds.  Pulmonary:     Effort: Pulmonary effort is normal.     Breath sounds: Normal breath sounds.  Chest:     Comments: Bilateral mastectomy scars are well-healed without overlying nodularity. Abdominal:     Palpations: Abdomen is soft. There is no hepatomegaly, splenomegaly or mass.      Tenderness: There is no abdominal tenderness.  Musculoskeletal:     Right lower leg: No edema.     Left lower leg: No edema.  Lymphadenopathy:     Cervical: No cervical adenopathy.     Right cervical: No superficial, deep or posterior cervical adenopathy.    Left cervical: No superficial, deep or posterior cervical adenopathy.     Upper Body:     Right upper body: No supraclavicular or axillary adenopathy.     Left upper body: No supraclavicular or axillary adenopathy.  Neurological:     General: No focal deficit present.     Mental Status: She is alert and oriented to person, place, and time.  Psychiatric:        Mood and Affect: Mood normal.        Behavior: Behavior normal.     LABS:      Latest Ref Rng & Units 10/21/2024    1:03 PM 04/17/2024    2:13 PM 12/06/2023   12:25 PM  CBC  WBC 4.0 - 10.5 K/uL 8.5  8.7  8.1   Hemoglobin 12.0 - 15.0 g/dL 87.3  88.9  87.6   Hematocrit 36.0 - 46.0 % 39.4  34.9  37.3   Platelets 150 - 400 K/uL 289  249  296       Latest Ref Rng & Units 10/21/2024    1:03 PM 04/17/2024    2:13 PM 12/06/2023   12:25 PM  CMP  Glucose 70 - 99 mg/dL 75  897  97   BUN 8 - 23 mg/dL 27  30  30    Creatinine 0.44 - 1.00 mg/dL 8.76  8.74  8.87   Sodium 135 - 145 mmol/L 140  137  138   Potassium 3.5 - 5.1 mmol/L 4.4  4.0  4.1   Chloride 98 - 111 mmol/L 104  106  103   CO2 22 - 32 mmol/L 29  26  26    Calcium 8.9 - 10.3 mg/dL 9.9  9.2  9.5   Total Protein 6.5 - 8.1 g/dL 6.7  5.8  6.7   Total Bilirubin 0.0 - 1.2 mg/dL 0.4  0.4  0.6   Alkaline Phos 38 - 126 U/L 81  64  71   AST 15 - 41 U/L 17  14  17    ALT 0 - 44 U/L 22  15  18       No results found for: CEA1, CEA / No results found for: CEA1, CEA No results found for: PSA1 No results found for: CAN199 No results found for: RJW874  Lab Results  Component Value Date   TOTALPROTELP 6.4 10/26/2022   ALBUMINELP 3.6 10/26/2022   A1GS 0.2 10/26/2022   A2GS 0.6 10/26/2022   BETS 1.1 10/26/2022    GAMS 0.9 10/26/2022   MSPIKE Not Observed  10/26/2022   SPEI Comment 10/26/2022   Lab Results  Component Value Date   TIBC 301 10/21/2024   TIBC 334 02/21/2023   TIBC 350 02/14/2023   FERRITIN 220 10/21/2024   FERRITIN 62 02/21/2023   FERRITIN 67 02/14/2023   IRONPCTSAT 20 10/21/2024   IRONPCTSAT 10 (L) 02/21/2023   IRONPCTSAT 10 (L) 02/14/2023   Lab Results  Component Value Date   LDH 140 06/27/2011     STUDIES:   No results found.    "

## 2024-11-02 ENCOUNTER — Other Ambulatory Visit: Payer: Self-pay

## 2024-11-04 ENCOUNTER — Ambulatory Visit: Attending: Internal Medicine

## 2024-11-04 VITALS — Wt 202.4 lb

## 2024-11-04 DIAGNOSIS — Z483 Aftercare following surgery for neoplasm: Secondary | ICD-10-CM | POA: Insufficient documentation

## 2024-11-04 NOTE — Therapy (Signed)
 " OUTPATIENT PHYSICAL THERAPY SOZO SCREENING NOTE   Patient Name: Haley Peterson MRN: [id number] DOB:1946-10-13, 78 y.o., female Today's Date: 11/04/2024  PCP: Shona Norleen PEDLAR, MD REFERRING PROVIDER: Shona Norleen PEDLAR, MD   PT End of Session - 11/04/24 1005     Visit Number 2   # unchanged due to screen only   PT Start Time 1005    PT Stop Time 1009    PT Time Calculation (min) 4 min    Activity Tolerance Patient tolerated treatment well    Behavior During Therapy WFL for tasks assessed/performed          Past Medical History:  Diagnosis Date   Arthritis    Bone spur    left heel   Breast cancer (HCC)    bilateral   Cancer (HCC)    Bilateral Breast cancer   Cataract    right eye    CKD (chronic kidney disease), stage III (HCC)    mgd by PCP    History of breast cancer    left, right   Hypertension    Personal history of radiation therapy    Pneumonia 10/2012   Port-A-Cath in place 12/06/2022   Past Surgical History:  Procedure Laterality Date   ABDOMINAL HYSTERECTOMY     BIOPSY  04/12/2017   Procedure: BIOPSY;  Surgeon: Golda Claudis PENNER, MD;  Location: [facility];  Service: Endoscopy;;  ascending colon ulcer biopsies   BREAST BIOPSY Left 09/22/2022   US  LT BREAST BX W LOC DEV EA ADD LESION IMG BX SPEC US  GUIDE 09/22/2022 [facility]   BREAST BIOPSY Left 09/22/2022   US  LT BREAST BX W LOC DEV 1ST LESION IMG BX SPEC US  GUIDE 09/22/2022 [facility]   BREAST LUMPECTOMY  08/28/2001   left   BREAST RECONSTRUCTION Right    implant   CHOLECYSTECTOMY  2002   COLONOSCOPY     COLONOSCOPY N/A 04/12/2017   Procedure: COLONOSCOPY;  Surgeon: Golda Claudis PENNER, MD;  Location: [facility];  Service: Endoscopy;  Laterality: N/A;  830   MASTECTOMY  08/15/2006   right   PARTIAL HYSTERECTOMY  1985   PORTACATH PLACEMENT Right 10/31/2022   Procedure: INSERTION PORT-A-CATH;  Surgeon: Ebbie Cough, MD;  Location: [facility];  Service: General;  Laterality:  Right;   REPLACEMENT TOTAL KNEE  2009, 2010   right-2010, left 2009   SIMPLE MASTECTOMY WITH AXILLARY SENTINEL NODE BIOPSY Left 10/31/2022   Procedure: LEFT MASTECTOMY;  Surgeon: Ebbie Cough, MD;  Location: [facility];  Service: General;  Laterality: Left;  90 MIN ROOM 2   TISSUE EXPANDER PLACEMENT     TOTAL HIP ARTHROPLASTY Right 10/09/2017   Procedure: RIGHT TOTAL HIP ARTHROPLASTY ANTERIOR APPROACH;  Surgeon: Liam Lerner, MD;  Location: [facility];  Service: Orthopedics;  Laterality: Right;   TOTAL HIP ARTHROPLASTY Left 09/23/2019   Procedure: Left Anterior Hip Arthroplasty;  Surgeon: Liam Lerner, MD;  Location: [facility];  Service: Orthopedics;  Laterality: Left;   Patient Active Problem List   Diagnosis Date Noted   Long term current use of aromatase inhibitor 11/01/2024   Osteopenia 11/01/2024   Iron deficiency anemia 02/21/2023   Port-A-Cath in place 12/06/2022   S/P left mastectomy 10/31/2022   Breast cancer of upper-outer quadrant of left female breast (HCC) 10/25/2022   S/P hip replacement, left 09/23/2019   Osteoarthritis of left hip 09/20/2019   Primary osteoarthritis of right hip 10/09/2017   Osteoarthritis of right hip  10/04/2017   Special screening for malignant neoplasms, colon 12/30/2016   Breast cancer (HCC) 06/27/2011   Hx Breast cancer, Right, multifocal IDC, receptor +, Her2 - 06/07/2006    Class: Stage 1   Hx Breast cancer (DCIS), Right, Stage 0,  08/08/2001    REFERRING DIAG: left breast cancer at risk for lymphedema  THERAPY DIAG: Aftercare following surgery for neoplasm  PERTINENT HISTORY: Patient was diagnosed on 10/13/22 with left breast cancer in 2 locations measuring around 2.5cm in total. Negative axillary US . grade 2 IDC. It is ER/PR/HER2 positive with a Ki67 of 20%.  Prior hx of Lt lumpectomy with radiation and SLNB with 1 negative lymph node removed in 2002 and Rt mastectomy with implant in 2007. Lt mastectomy 10/31/22 with 1 negative  node removed.   PRECAUTIONS: left UE Lymphedema risk  SUBJECTIVE: Pt returns for her 3 month L-Dex screen.   PAIN:  Are you having pain? No  SOZO SCREENING: Patient was assessed today using the SOZO machine to determine the lymphedema index score. This was compared to her baseline score. It was determined that she is within the recommended range when compared to her baseline and no further action is needed at this time. She will continue SOZO screenings. These are done every 3 months for 2 years post operatively followed by every 6 months for 2 years, and then annually.  P: SOZO Q4months until at least 10/31/24 and Q17months until 10/31/26   L-DEX FLOWSHEETS - 11/04/24 1000       L-DEX LYMPHEDEMA SCREENING   Measurement Type Unilateral    L-DEX MEASUREMENT EXTREMITY Upper Extremity    POSITION  Standing    DOMINANT SIDE Right    At Risk Side Left    BASELINE SCORE (UNILATERAL) 2.8    L-DEX SCORE (UNILATERAL) 5.7    VALUE CHANGE (UNILAT) 2.9         P: Begin 6 month L-Dex screens until 10/2026, then can transition to annual.   Aden Berwyn Caldron, PTA 11/04/2024, 10:08 AM    "

## 2024-12-17 ENCOUNTER — Other Ambulatory Visit: Payer: Self-pay | Admitting: *Deleted

## 2024-12-17 MED ORDER — ANASTROZOLE 1 MG PO TABS: 1.0000 mg | ORAL_TABLET | Freq: Every day | ORAL | 6 refills | Status: AC

## 2025-04-28 ENCOUNTER — Inpatient Hospital Stay

## 2025-04-28 ENCOUNTER — Other Ambulatory Visit (HOSPITAL_COMMUNITY)

## 2025-05-05 ENCOUNTER — Ambulatory Visit

## 2025-05-06 ENCOUNTER — Inpatient Hospital Stay: Admitting: Oncology
